# Patient Record
Sex: Female | Born: 1960
Health system: Southern US, Community
[De-identification: ages and names within clinical notes are randomized; demographics above are authoritative.]

## PROBLEM LIST (undated history)

## (undated) DIAGNOSIS — I2510 Atherosclerotic heart disease of native coronary artery without angina pectoris: Secondary | ICD-10-CM

## (undated) DIAGNOSIS — E785 Hyperlipidemia, unspecified: Secondary | ICD-10-CM

## (undated) DIAGNOSIS — G7102 Facioscapulohumeral muscular dystrophy: Secondary | ICD-10-CM

## (undated) DIAGNOSIS — I509 Heart failure, unspecified: Secondary | ICD-10-CM

## (undated) DIAGNOSIS — R9430 Abnormal result of cardiovascular function study, unspecified: Secondary | ICD-10-CM

## (undated) DIAGNOSIS — I729 Aneurysm of unspecified site: Secondary | ICD-10-CM

## (undated) DIAGNOSIS — Z95810 Presence of automatic (implantable) cardiac defibrillator: Secondary | ICD-10-CM

## (undated) DIAGNOSIS — I219 Acute myocardial infarction, unspecified: Secondary | ICD-10-CM

## (undated) DIAGNOSIS — M1990 Unspecified osteoarthritis, unspecified site: Secondary | ICD-10-CM

## (undated) DIAGNOSIS — R42 Dizziness and giddiness: Secondary | ICD-10-CM

## (undated) DIAGNOSIS — E119 Type 2 diabetes mellitus without complications: Secondary | ICD-10-CM

## (undated) DIAGNOSIS — I671 Cerebral aneurysm, nonruptured: Secondary | ICD-10-CM

## (undated) DIAGNOSIS — R51 Headache: Secondary | ICD-10-CM

## (undated) DIAGNOSIS — R002 Palpitations: Secondary | ICD-10-CM

## (undated) DIAGNOSIS — R112 Nausea with vomiting, unspecified: Secondary | ICD-10-CM

## (undated) DIAGNOSIS — I773 Arterial fibromuscular dysplasia: Secondary | ICD-10-CM

## (undated) DIAGNOSIS — Z9889 Other specified postprocedural states: Secondary | ICD-10-CM

## (undated) DIAGNOSIS — I10 Essential (primary) hypertension: Secondary | ICD-10-CM

## (undated) DIAGNOSIS — G43909 Migraine, unspecified, not intractable, without status migrainosus: Secondary | ICD-10-CM

## (undated) HISTORY — PX: CARDIAC DEFIBRILLATOR PLACEMENT: SHX171

## (undated) HISTORY — DX: Dizziness and giddiness: R42

## (undated) HISTORY — DX: Heart failure, unspecified: I50.9

## (undated) HISTORY — DX: Facioscapulohumeral muscular dystrophy: G71.02

## (undated) HISTORY — DX: Hyperlipidemia, unspecified: E78.5

## (undated) HISTORY — PX: TUBAL LIGATION: SHX77

## (undated) HISTORY — DX: Cerebral aneurysm, nonruptured: I67.1

## (undated) HISTORY — PX: ABDOMINAL HYSTERECTOMY: SUR658

## (undated) HISTORY — DX: Migraine, unspecified, not intractable, without status migrainosus: G43.909

## (undated) HISTORY — DX: Aneurysm of unspecified site: I72.9

## (undated) HISTORY — PX: PACEMAKER IMPLANT: EP1218

## (undated) HISTORY — PX: CORONARY ANGIOPLASTY WITH STENT PLACEMENT: SHX49

## (undated) HISTORY — DX: Headache: R51

## (undated) HISTORY — DX: Essential (primary) hypertension: I10

---

## 2002-04-16 ENCOUNTER — Other Ambulatory Visit: Admission: RE | Admit: 2002-04-16 | Discharge: 2002-04-16 | Payer: Self-pay | Admitting: Obstetrics and Gynecology

## 2003-05-17 ENCOUNTER — Other Ambulatory Visit: Admission: RE | Admit: 2003-05-17 | Discharge: 2003-05-17 | Payer: Self-pay | Admitting: Obstetrics and Gynecology

## 2004-06-14 ENCOUNTER — Other Ambulatory Visit: Admission: RE | Admit: 2004-06-14 | Discharge: 2004-06-14 | Payer: Self-pay | Admitting: Obstetrics and Gynecology

## 2004-08-16 ENCOUNTER — Encounter (INDEPENDENT_AMBULATORY_CARE_PROVIDER_SITE_OTHER): Payer: Self-pay | Admitting: Specialist

## 2004-08-16 ENCOUNTER — Inpatient Hospital Stay (HOSPITAL_COMMUNITY): Admission: RE | Admit: 2004-08-16 | Discharge: 2004-08-18 | Payer: Self-pay | Admitting: Obstetrics and Gynecology

## 2004-08-16 HISTORY — PX: ABDOMINAL HYSTERECTOMY: SUR658

## 2004-10-26 ENCOUNTER — Ambulatory Visit (HOSPITAL_COMMUNITY): Admission: RE | Admit: 2004-10-26 | Discharge: 2004-10-26 | Payer: Self-pay | Admitting: Urology

## 2004-10-26 ENCOUNTER — Ambulatory Visit (HOSPITAL_BASED_OUTPATIENT_CLINIC_OR_DEPARTMENT_OTHER): Admission: RE | Admit: 2004-10-26 | Discharge: 2004-10-26 | Payer: Self-pay | Admitting: Urology

## 2004-10-26 ENCOUNTER — Encounter (INDEPENDENT_AMBULATORY_CARE_PROVIDER_SITE_OTHER): Payer: Self-pay | Admitting: Specialist

## 2007-11-19 DIAGNOSIS — I639 Cerebral infarction, unspecified: Secondary | ICD-10-CM

## 2007-11-19 HISTORY — DX: Cerebral infarction, unspecified: I63.9

## 2007-11-19 HISTORY — PX: CEREBRAL ANEURYSM REPAIR: SHX164

## 2008-06-10 ENCOUNTER — Ambulatory Visit: Payer: Self-pay | Admitting: Surgery

## 2008-06-11 ENCOUNTER — Inpatient Hospital Stay (HOSPITAL_COMMUNITY): Admission: EM | Admit: 2008-06-11 | Discharge: 2008-06-13 | Payer: Self-pay | Admitting: Emergency Medicine

## 2008-06-24 ENCOUNTER — Encounter: Payer: Self-pay | Admitting: Family Medicine

## 2008-07-14 ENCOUNTER — Inpatient Hospital Stay (HOSPITAL_COMMUNITY): Admission: RE | Admit: 2008-07-14 | Discharge: 2008-07-15 | Payer: Self-pay | Admitting: Interventional Radiology

## 2008-07-14 DIAGNOSIS — I671 Cerebral aneurysm, nonruptured: Secondary | ICD-10-CM

## 2008-07-14 HISTORY — DX: Cerebral aneurysm, nonruptured: I67.1

## 2008-07-28 ENCOUNTER — Encounter: Payer: Self-pay | Admitting: Interventional Radiology

## 2008-12-09 ENCOUNTER — Encounter: Payer: Self-pay | Admitting: Interventional Radiology

## 2009-09-08 ENCOUNTER — Ambulatory Visit (HOSPITAL_COMMUNITY): Admission: RE | Admit: 2009-09-08 | Discharge: 2009-09-08 | Payer: Self-pay | Admitting: Legal Medicine

## 2009-10-26 ENCOUNTER — Ambulatory Visit (HOSPITAL_COMMUNITY): Admission: RE | Admit: 2009-10-26 | Discharge: 2009-10-27 | Payer: Self-pay | Admitting: Interventional Radiology

## 2009-11-13 ENCOUNTER — Ambulatory Visit (HOSPITAL_COMMUNITY): Admission: RE | Admit: 2009-11-13 | Discharge: 2009-11-13 | Payer: Self-pay | Admitting: Interventional Radiology

## 2010-08-15 DIAGNOSIS — I10 Essential (primary) hypertension: Secondary | ICD-10-CM | POA: Insufficient documentation

## 2010-08-15 DIAGNOSIS — I7774 Dissection of vertebral artery: Secondary | ICD-10-CM | POA: Insufficient documentation

## 2010-12-09 ENCOUNTER — Encounter: Payer: Self-pay | Admitting: Interventional Radiology

## 2011-02-18 LAB — CBC
MCHC: 35.2 g/dL (ref 30.0–36.0)
Platelets: 297 10*3/uL (ref 150–400)
WBC: 7.7 10*3/uL (ref 4.0–10.5)

## 2011-02-19 LAB — PROTIME-INR
INR: 1 (ref 0.00–1.49)
INR: 1.15 (ref 0.00–1.49)
Prothrombin Time: 13.1 seconds (ref 11.6–15.2)

## 2011-02-19 LAB — BASIC METABOLIC PANEL
BUN: 11 mg/dL (ref 6–23)
CO2: 25 mEq/L (ref 19–32)
Calcium: 10.2 mg/dL (ref 8.4–10.5)
Chloride: 109 mEq/L (ref 96–112)
Creatinine, Ser: 0.85 mg/dL (ref 0.4–1.2)
GFR calc Af Amer: >60 mL/min (ref >60)
Potassium: 3.8 mEq/L (ref 3.5–5.1)
Potassium: 4.7 mEq/L (ref 3.5–5.1)
Sodium: 138 mEq/L (ref 135–145)
Sodium: 139 mEq/L (ref 135–145)

## 2011-02-19 LAB — DIFFERENTIAL
Basophils Absolute: 0 10*3/uL (ref 0.0–0.1)
Basophils Relative: 1 % (ref 0–1)
Eosinophils Absolute: 0.1 10*3/uL (ref 0.0–0.7)
Eosinophils Absolute: 0.2 10*3/uL (ref 0.0–0.7)
Eosinophils Absolute: 0.2 10*3/uL (ref 0.0–0.7)
Eosinophils Relative: 1 % (ref 0–5)
Lymphocytes Relative: 9 % — ABNORMAL LOW (ref 12–46)
Lymphs Abs: 0.9 10*3/uL (ref 0.7–4.0)
Lymphs Abs: 1.4 10*3/uL (ref 0.7–4.0)
Monocytes Absolute: 0.4 10*3/uL (ref 0.1–1.0)
Monocytes Relative: 8 % (ref 3–12)
Monocytes Relative: 9 % (ref 3–12)
Neutro Abs: 3.4 10*3/uL (ref 1.7–7.7)
Neutrophils Relative %: 61 % (ref 43–77)
Neutrophils Relative %: 62 % (ref 43–77)
Neutrophils Relative %: 82 % — ABNORMAL HIGH (ref 43–77)

## 2011-02-19 LAB — CBC
HCT: 32.6 % — ABNORMAL LOW (ref 36.0–46.0)
Hemoglobin: 11.4 g/dL — ABNORMAL LOW (ref 12.0–15.0)
Hemoglobin: 11.9 g/dL — ABNORMAL LOW (ref 12.0–15.0)
MCHC: 34.8 g/dL (ref 30.0–36.0)
MCHC: 34.9 g/dL (ref 30.0–36.0)
MCHC: 35 g/dL (ref 30.0–36.0)
MCV: 93.6 fL (ref 78.0–100.0)
MCV: 94.4 fL (ref 78.0–100.0)
MCV: 94.6 fL (ref 78.0–100.0)
Platelets: 254 10*3/uL (ref 150–400)
Platelets: 262 10*3/uL (ref 150–400)
RBC: 3.45 MIL/uL — ABNORMAL LOW (ref 3.87–5.11)
RBC: 3.68 MIL/uL — ABNORMAL LOW (ref 3.87–5.11)
RBC: 4.31 MIL/uL (ref 3.87–5.11)
WBC: 5.4 10*3/uL (ref 4.0–10.5)
WBC: 5.4 10*3/uL (ref 4.0–10.5)
WBC: 7.8 10*3/uL (ref 4.0–10.5)

## 2011-02-19 LAB — HEPARIN LEVEL (UNFRACTIONATED): Heparin Unfractionated: 0.2 IU/mL — ABNORMAL LOW (ref 0.30–0.70)

## 2011-02-21 LAB — CBC
HCT: 43.9 % (ref 36.0–46.0)
Hemoglobin: 15.4 g/dL — ABNORMAL HIGH (ref 12.0–15.0)
MCV: 94.1 fL (ref 78.0–100.0)
RDW: 12.8 % (ref 11.5–15.5)
WBC: 7.4 10*3/uL (ref 4.0–10.5)

## 2011-02-21 LAB — APTT: aPTT: 25 seconds (ref 24–37)

## 2011-02-21 LAB — BASIC METABOLIC PANEL
Chloride: 107 mEq/L (ref 96–112)
GFR calc non Af Amer: >60 mL/min (ref >60)
Glucose, Bld: 102 mg/dL — ABNORMAL HIGH (ref 70–99)
Potassium: 5 mEq/L (ref 3.5–5.1)
Sodium: 139 mEq/L (ref 135–145)

## 2011-04-02 NOTE — Consult Note (Signed)
NAME:  Allison White, TAKETA NO.:  [id number]   MEDICAL RECORD NO.:  [id number]          PATIENT TYPE:  OUT   LOCATION:  XRAY                         FACILITY:  [facility]   PHYSICIAN:  Sanjeev K. Deveshwar, M.D.DATE OF BIRTH:  [birth year]   DATE OF CONSULTATION:  12/09/2008  DATE OF DISCHARGE:                                 CONSULTATION   CHIEF COMPLAINT:  Status post left internal carotid artery  pseudoaneurysm stent performed on July 14, 2008.   BRIEF HISTORY:  This is a very pleasant 50 year old female who was  admitted to [facility] on July 2009 by [facility] for  evaluation of a headache and dysarthria.  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  A cerebral angiogram was  performed by Dr. Corliss Skains on June 11, 2008.  This showed a large left  internal carotid artery pseudoaneurysm.  There was a question of an old  healed right internal carotid artery dissection as well.  There were  changes consistent with fibromuscular dysplasia.  The patient was  treated with aspirin and Plavix.  Treatment options were later discussed  along with the risks and benefits.  The patient eventually underwent  stent placement for the left internal carotid artery pseudoaneurysm  performed on July 14, 2008, by Dr. Corliss Skains without known or  immediate complications.   Over the past several weeks, the patient has developed some mild to  moderate headaches.  She was seen at her primary care office yesterday  for further evaluation.  She was seen by Gerre Pebbles, a physician  assistant, who contacted our office for further information.  The  patient apparently had no neurologic deficits and her vital signs were  stable other than a mildly high blood pressure and the above-noted  headache.  We recommended symptomatic treatment with a followup visit  with Dr. Corliss Skains today.   PAST MEDICAL HISTORY:  Significant for hypertension, hyperlipidemia,  interstitial cystitis, history of meningitis 4-5 years ago, and history  of a hole in my heart as a child.   PAST SURGICAL HISTORY:  The patient has had a C-section and  hysterectomy.  She has had bladder surgery.  She has had nausea and  vomiting associated with anesthesia.  This has been relieved in the past  with a scopolamine patch.   ALLERGIES:  No known drug allergies.  The patient had been placed on  Plavix after the stent was placed; however, this caused joint pain and  she was switched to Ticlid.  The Ticlid was eventually discontinued.   CURRENT MEDICATIONS:  Aspirin 325 mg daily and lisinopril.  It is  unclear whether she still takes Zocor or Mavik.  She does take  hydrocodone 1/2 tablet for headache pain.   SOCIAL HISTORY:  The patient is married.  She and her husband have four  children.  [place]  She has a minimal  history of tobacco use.  She drinks alcohol rarely.  She works as an  Airline pilot for [employer].   FAMILY  HISTORY:  Her mother is alive at age 65.  She has hypertension  and hypothyroidism.  Her father is alive at age 61.  He has a history of  prostate cancer and hypertension.  She has a grandmother who had a CVA  and a possible aneurysm in her neck.   IMPRESSION AND PLAN:  As noted, the patient returns today for further  evaluation having undergone stent placement for a left internal carotid  artery pseudoaneurysm on July 14, 2008.  There was also a question of  possible old right internal carotid artery dissection.  The patient had  experienced difficulty with tongue movements as well as some changes in  her peripheral vision.  On initial presentation, there was some concern  that she may have had a stroke.  Dr. Corliss Skains feels that the symptoms  were related to the pseudoaneurysm; for the most part these have now  improved and resolved.   Dr. Corliss Skains  did recommend a followup cerebral angiogram 3 months after  the placement of the stent per his protocol.  The patient declined due  to the cost of the procedure and the fact that she was asymptomatic.   As noted, the patient recently developed some headaches.  She describes  this is being in the left parietal area.  At times, the pain is an 8 on  a 1 to 10 scale.  She has also been feeling dizzy.  She reports that her  head throbs and she has photophobia associated with these headaches.  They are usually relieved with rest and a half of a Vicodin.  She has  been having several of these headaches per week.   Dr. Corliss Skains did not feel that the headaches were related to the  previous stent placement or the pseudoaneurysm.  He did review the  images from the previous placement of the stent with the patient and her  husband.  He pointed out the area.  As noted, the patient has declined  having further angiogram as had been recommended.  Since the patient is  asymptomatic from a neurologic standpoint, Dr. Corliss Skains did not feel  that it is imperative to have further studies at this time.   Yesterday, her blood pressure apparently was mildly high.  We had it  checked by our nurse today.  Her blood pressure in the right arm was  122/92, the left arm was 129/78.  I believe she is on Lotensin 1/2  tablet per day for her blood pressure.  We will defer further decisions  regarding blood pressure to her primary care physician.   Dr. Corliss Skains did recommend a followup visit with him in approximately 4  months.  She is to contact us if she develops any new onset of  neurologic symptoms, otherwise the headaches should be treated  symptomatically with rest and hydrocodone.  Greater than 40 minutes was  spent on this visit.      Delton See, P.A.    ______________________________  Grandville Silos. Corliss Skains, M.D.    DR/MEDQ  D:  12/09/2008  T:  12/10/2008  Job:  [id number]   cc:   Revonda Standard

## 2011-04-02 NOTE — H&P (Signed)
NAME:  Allison White, Allison White NO.:  [id number]   MEDICAL RECORD NO.:  [id number]          PATIENT TYPE:  INP   LOCATION:  1844                         FACILITY:  [facility]   PHYSICIAN:  Gustavus Messing. Orlin Hilding, M.D.DATE OF BIRTH:  January 12, 1961   DATE OF ADMISSION:  06/10/2008  DATE OF DISCHARGE:                              HISTORY & PHYSICAL   CHIEF COMPLAINT:  Severe left-sided headache and tongue weakness.   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  She noted that she had a  pretty significant headache on the left side of her head, left forehead,  and posterior for about the last week when she woke up on Thursday  morning with part of her tongue not working right, and her speech  slurred and voice hoarse.  She thought perhaps that she was getting  strep throat as her son who lives with her also had that; however, she  felt that it perhaps is more than that.  By Friday morning, she went to  see her primary physician who ordered an MRI scan which was done at  [facility].  MRI was negative for any acute stroke, but MRA  suggested that she had a pseudoaneurysm in the left ICA and possibly  dissection.  Primary physician spoke with neurosurgeon, Dr. Yetta Barre, at  [facility] and the patient was to follow up and be seen by Dr. Yetta Barre,  but Dr. Yetta Barre determined that the problem was not neurosurgical in  nature.  She was then seen by the Vascular Surgery Service who  determined that it was not a surgical problem and Neurology was  eventually consulted.  The patient denies any weakness, numbness, vision  problems, and I think she has difficulty with speech because of her  tongue weakness and some hoarseness and choked a little bit on food and  beverages.   REVIEW OF SYSTEMS:  Positive for the headache and otherwise as outlined  in the history of present illness.  She has some bladder irritation from  time  to time and takes Pyridium for that.   PAST MEDICAL HISTORY:  Significant for a recent question of elevated  blood pressure just yesterday, had a remote C-section, remote  hysterectomy, and some kind of bladder stretching procedure.   MEDICATIONS:  She was only just put on lisinopril yesterday at 10 mg and  she takes p.r.n. Pyridium.   ALLERGIES:  No known drug allergies.   SOCIAL HISTORY:  She is an Airline pilot for [employer].  She is married with  four children.  She never used tobacco, alcohol, or illicit drugs,  specifically has never used cocaine.   OBJECTIVE:  VITAL SIGNS:  Blood pressure is 109/69, heart rate 87,  respirations 23, and 97% sat.  HEENT:  Head is normocephalic, atraumatic.  NECK:  Supple without bruits.  HEART:  Regular rate and rhythm.  LUNGS:  Clear to auscultation.  ABDOMEN:  Nontender.  SKIN:  Dry.  Mucosa moist.  NEUROLOGIC:  She is alert.  She answers 2/2 questions correctly, follows  2/2  commands correctly, has a normal gaze.  No visual deficit.  No  facial weakness, although there was some mild asymmetry from time to  time, a little bit hard to tell actually looking at her right facial  weakness, but it was not reproducible.  There is no drift in either  upper or lower extremities and no ataxia.  No sensory loss.  No language  deficit.  She did have a mild dysarthria.  There is no extinction.   Scans were done at an outside hospital that by report showed a negative  MRI, but showed the questionable aneurysm.  She did have a CT angiogram  done to try to get a better view of the vessels.  It is still  questionable that she apparently has a pseudoaneurysm at the genu of the  petrous portion of the left ICA.  The interpretation by Dr. Constance Goltz  indicated abnormal appearance of the cervical aspect of both ICA  especially on the left possibly related to vasculitis such as  fibromuscular dysplasia, cannot rule out dissection, especially on the  left where there  is fusiform aneurysmal dilatation at petrous genu,  question pseudoaneurysm.  It is recommended that a conventional catheter  angiogram be performed.  Labs were unremarkable.  Sed rate was ordered  and is normal at 4.  Score of only 1 on the stroke scale was noted.   ASSESSMENT:  Left tongue weakness and dysarthria associated with a left  internal carotid artery dissection and/or pseudoaneurysm with  questionable underlying connective tissue disorder.   PLAN:  Admit to stroke service.  We will obtain a cerebral angiogram in  the morning.  She needs to be n.p.o. for now and needs a speech  evaluation after the angiogram.  We will probably treat her with  aspirin.      Catherine A. Orlin Hilding, M.D.  Electronically Signed     CAW/MEDQ  D:  06/11/2008  T:  06/11/2008  Job:  [id number]

## 2011-04-02 NOTE — Discharge Summary (Signed)
NAME:  Allison White, Allison White              ACCOUNT NO.:  [id number]   MEDICAL RECORD NO.:  [id number]          PATIENT TYPE:  INP   LOCATION:  3005                         FACILITY:  [facility]   PHYSICIAN:  Pramod P. Pearlean Brownie, MD    DATE OF BIRTH:  1961/09/12   DATE OF ADMISSION:  06/11/2008  DATE OF DISCHARGE:  06/13/2008                               DISCHARGE SUMMARY   DIAGNOSES AT TIME OF DISCHARGE:  1. Pseudoaneurysm left internal carotid artery of the petrous segment      question etiology, possibly fibromuscular dysplasia.  2. Dyslipidemia.  3. Hysterectomy.  4. Cesarean section.  5. Enlarged bladder.  6. Question diagnosis of hypertension day prior to admission with      normal blood pressures in the hospital.   MEDICINES AT TIME OF DISCHARGE:  1. Aspirin 81 mg a day.  2. Zocor 20 mg a day.  3. Plavix 75 mg a day.  4. Consider lisinopril 10 mg a day should blood pressure remain      elevated.   STUDIES PERFORMED:  1. MRI of the brain shows negative stroke abnormality in the left ICA      petrous area.  CT angio of the head and neck done in hospital shows      no intracranial aneurysm identified, major intracranial vascular      structures patent.  CT angio of the neck shows possibility of      fibromuscular dysplasia.  Other arteriopathy possibilities with ICA      dissection contributing to fusiform aneurysmal dilatation of the      distal left internal carotid artery at the skull base.  Fullness of      the left tongue base with __________ the vallecula.  Chest x-ray      shows no acute abnormalities.  EKG shows normal sinus rhythm.   LABORATORY STUDIES:  Homocystine 5.1, sed rate 6, CBC with hemoglobin  15.1, otherwise normal.  Cholesterol 227, triglycerides 113, HDL 56, LDL  148.  Coags on admission were normal.  Chemistry normal.  Blood type B+  with no antibodies.  Urinalysis was 7-10 red blood cells, 0-2 white  blood cells, many bacteria.   HISTORY OF PRESENT ILLNESS:  Ms.  Allison White is a 50 year old female  who complains of significant headache on the left side of her head, left  forehead, and posterior for about the last week.  When she woke up on  Thursday morning with part of her tongue not working right and her  speech slurred and voice hoarse.  She thought perhaps she was getting a  strep throat as her son who lives with her also had that.  By Friday  morning, she went to see her primary physician who ordered an MRI scan,  which was done at [facility].  MRI was negative for acute stroke.  The MRI suggested a pseudoaneurysm in the left ICA with possible  dissection.  Her primary physician spoke with Dr. Yetta Barre, neurosurgeon,  and the patient was asked to follow up with him.  Dr. Yetta Barre then  determined that problem was not  neurosurgical in nature and was seen by  our Vascular Surgery Service who determined that it was not a surgical  problem and Neurology was eventually consulted.  The patient denied any  weakness, numbness, vision problems.  I think she had difficulty with  speech and also tongue weakness and some hoarseness and choked a bit on  food and beverages.  She was admitted to the hospital for further stroke  evaluation.  She was not a TPA candidate secondary to onset of symptoms  and no acute stroke.   HOSPITAL COURSE:  CT angio of the neck did confirm pseudoaneurysm and  left ICA petrous segment at the base of the skull.  Possibly, this is  related to fibromuscular dysplasia based on the CT angio.  She was  placed on aspirin and Plavix for treatment for the next 3 months with  plans to go to aspirin only after 3 months.  Options for PTCA and stent  were discussed with the patient as were possible referral to [facility].  The  patient refused referral at this time.  We will consider treatment  options and possibly stent in the future.  She is going to follow up  with Dr. Pearlean Brownie in 2-3 months.  She was strongly advised to call should  she  have any other teeth, tongue weakness or any other neuro symptoms or  changes.  Of note, the patient was also diagnosed with dyslipidemia in  the hospital and started on Zocor.  She apparently had also been started  on lisinopril the day prior to admission though her blood pressures in  the hospital have been within normal range with the highest being  130/91.  Blood pressure on day of discharge was 111/80.  We will not  continue lisinopril at this time, but may need to consider based on home  blood pressures.  She has been asked to follow up with her primary care  physician within 1-2 weeks.   CONDITION ON DISCHARGE:  The patient alert and oriented x3.  No aphasia,  no dysarthria.  Eye movements are full.  Her face is symmetric.  Her  left tongue is wasting and her tongue deviates to the left on  protrusion, but moves bilaterally.  Otherwise, she has no focal limb  weakness, no sensory loss.   DISCHARGE PLAN:  1. Discharge home with family.  2. Aspirin and Plavix x3 months, then aspirin alone.  3. Follow up with Dr. Delia Heady in 2-3 months.  Consider angiogram      at that time and possible stent treatment if needed.  4. The patient has been advised to follow her primary care physician      within 1 month.  5. The patient has also been advised to call with any new neuro      complaints.      Annie Main, N.P.    ______________________________  Sunny Schlein. Pearlean Brownie, MD    SB/MEDQ  D:  06/13/2008  T:  06/14/2008  Job:  [id number]   cc:   Sharyne Peach

## 2011-04-02 NOTE — Consult Note (Signed)
NAME:  Allison White, Allison White NO.:  [id number]   MEDICAL RECORD NO.:  [id number]          PATIENT TYPE:  OUT   LOCATION:  XRAY                         FACILITY:  [facility]   PHYSICIAN:  Sanjeev K. Deveshwar, M.D.DATE OF BIRTH:  1961-03-26   DATE OF CONSULTATION:  06/24/2008  DATE OF DISCHARGE:                                 CONSULTATION   CHIEF COMPLAINT:  Cerebrovascular disease.   HISTORY OF PRESENT ILLNESS:  This is a 50 year old female who was  admitted to [facility] on June 10, 2008, by Dr. Clint Bolder  with a headache and dysarthria.  She had had an MRI, MRA previously  performed at [facility] that was consistent with a left internal  carotid artery pseudoaneurysm with possible dissection.  A cerebral  angiogram was performed by Dr. Corliss Skains on June 11, 2008, that showed a  large left internal carotid artery pseudoaneurysm.  There was a question  of an old healed right internal carotid dissection.  There were changes  consistent with fibromuscular dysplasia.  The patient was discharged  from the hospital on aspirin and Plavix.  She now presents accompanied  by her daughter and her mother to be seen in followup by Dr. Corliss Skains.   PAST MEDICAL HISTORY:  Significant for a questionable history of  hypertension.  The patient had been started on lisinopril recently.  Apparently, she had normal blood pressures while in the hospital and was  taken off the lisinopril.  She was found to have hyperlipidemia and she  is currently being treated with Zocor.  She has a history of a hole in  her heart as a child.  We do not have any details, apparently this  resolved on its own.  She has interstitial cystitis.  She had a  questionable case of meningitis 4-5 years ago.   SURGICAL HISTORY:  Significant for a C-section, a hysterectomy, and some  type of bladder procedure.  She reports nausea and vomiting associated  with anesthesia.  She reports that she had good  relief with a  scopolamine patch.   ALLERGIES:  No known drug allergies.   CURRENT MEDICATIONS:  1. Lisinopril, dose is not available.  2. Aspirin 81 mg daily.  3. Zocor 20 mg daily.  4. Plavix 75 mg daily.  5. Pyridium p.r.n.   SOCIAL HISTORY:  The patient is married.  She has 4 children.  She lives  in [place] with her husband.  She has a remote minimal history of  tobacco use.  She drinks alcohol rarely.  She works as an Airline pilot for  [employer].   FAMILY HISTORY:  Her mother is alive at age 57.  She has hypertension  and hypothyroidism.  Her father is alive at age 9.  He has a history of  prostate cancer and hypertension.  She had a grandmother who had a CVA  and possible aneurysm in her neck.   IMPRESSION AND PLAN:  As noted, the patient presents today accompanied  by her mother and daughter to be seen in consultation by Dr. Corliss Skains  to discuss results of her recent cerebral  angiogram performed on June 11, 2008, which showed a large left internal carotid artery  pseudoaneurysm.  She had had stroke-like symptoms at the time of  admission to the hospital including a headache, numbness of her tongue,  and dysarthria.  She has had at least 2 headaches since discharge from  the hospital, but neither of these have been severe.  She has occasional  difficulties with her speech when tired.  She states that her tongue  still feels numb in areas and she does have difficulty eating and  swallowing.  She has since returned to work.   Dr. Corliss Skains reviewed the results of the cerebral angiogram with the  patient and her family.  He felt that she would need a stent or possibly  2 stents for repair of the pseudoaneurysm.  He felt that she would be at  possible risk for a stroke if the pseudoaneurysm was not repaired.  Fibromuscular dysplasia was also discussed with the patient.  She  questioned whether she needed further workup for this problem.  Dr.  Corliss Skains felt that the main  concern at this time was to treat the  pseudoaneurysm.  A further workup could be addressed at a later date by  her primary care physician, Dr. Steva Ready.   The stenting procedure was described.  Dr. Corliss Skains felt that he might  be able to do this under monitored anesthesia care as opposed to general  anesthesia.  He would leave it up to the patient and her family to  decide whether or not she wanted to proceed with the intervention.  If  she did not have the area stented, he felt that she should least have a  repeat MRI in 6-8 weeks to further evaluate this area.  He did not feel  that this would heal on its own.   The patient was told to contact us when she had made a decision whether  or not to proceed with stenting.  She is currently on aspirin and  Plavix.  Dr. Corliss Skains recommends that she continue this long-term.  She  is to call us if she develops any new neurologic symptoms.  Greater than  40 minutes was spent on this consult.  All of their questions were  answered.  They were also given some written material to review.  The  patient will contact us next week with her decision.      Delton See, P.A.       ______________________________  Grandville Silos. Corliss Skains, M.D.    DR/MEDQ  D:  06/24/2008  T:  06/25/2008  Job:  [id number]   cc:   Sharyne Peach

## 2011-04-02 NOTE — Consult Note (Signed)
NAME:  Allison White, Allison White NO.:  [id number]   MEDICAL RECORD NO.:  [id number]          PATIENT TYPE:  OUT   LOCATION:  XRAY                         FACILITY:  [facility]   PHYSICIAN:  Sanjeev K. Deveshwar, M.D.DATE OF BIRTH:  10/20/1961   DATE OF CONSULTATION:  07/28/2008  DATE OF DISCHARGE:                                 CONSULTATION   DATE OF CONSULT:  July 28, 2008.   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   BRIEF HISTORY:  This is a pleasant 50 year old female who was admitted  to [facility] on June 10, 2008, by Dr. Marcelino Freestone for  evaluation of a headache and dysarthria.  The patient had an MRI and MRA  previously performed at [facility] that was consistent with a  left internal carotid artery pseudoaneurysm with possible dissection.  A  cerebral angiogram was performed by Dr. Corliss Skains on June 11, 2008.  This showed a large left internal carotid artery pseudoaneurysm.  There  was a question of an old healed right internal carotid artery dissection  as well.  There were changes noted to be consistent with fibromuscular  dysplasia.  The patient was treated with aspirin and Plavix.   The patient returned to see Dr. Corliss Skains in consultation on June 24, 2008.  Treatment options were discussed at that time.  The patient made  a decision to proceed with stenting of the pseudoaneurysm.  She was  admitted to [facility] on July 14, 2008, for the procedure.  There were no known or immediate complications.  The patient was  discharged the following day on July 15, 2008.  She returns today  accompanied by her husband to be seen in followup.   PAST MEDICAL HISTORY:  Significant for hypertension, hyperlipidemia,  interstitial cystitis, a history of meningitis 4-5 years ago, a history  of a hole in my heart as a child.  We do not have details regarding  this  issue.   PAST SURGICAL HISTORY:  The patient is status post C-section and status  post hysterectomy.  She has a history of bladder surgery.  She reports  nausea and vomiting associated with anesthesia.  She has had good relief  in the past with a scopolamine patch.   ALLERGIES:  No known drug allergies, although she was recently started  on Plavix, which she felt caused severe joint pain.  The Plavix was  changed to Ticlid during her most recent hospital stay.   CURRENT MEDICATIONS:  Aspirin 325 mg daily, lisinopril, Ticlid 250 mg  b.i.d., Zocor, Mavik, and Tylenol p.r.n.   SOCIAL HISTORY:  The patient is married.  She and her husband have 4  children.  [place].  She has a minimal  history of tobacco use.  She drinks alcohol rarely.  She works as an  Airline pilot for [employer].   FAMILY HISTORY:  Her mother is alive at age 95.  She has hypertension  and hypothyroidism.  Her father is alive at age 15, he has a  history of  prostate cancer and hypertension.  She had a grandmother who had a CVA  and a possible aneurysm in her neck.   IMPRESSION AND PLAN:  As noted, the patient returns today to be seen in  followup after undergoing stenting of a left internal carotid artery  pseudoaneurysm.  She is accompanied by her husband today for this visit.  Unfortunately, Dr. Corliss Skains was detained in a procedure.  The patient  did not wish to wait, so she requested to be seen by the physician  assistant in lieu of Dr. Corliss Skains.   The patient reports that she has been doing well.  She has had some mild  headaches, but this is not unusual for her.  Her cystitis did flare-up  after discharge from the hospital.  She feels this may in part be  secondary to the aspirin that she is currently taking.  As noted, we did  have the patient on Plavix, although this caused her to have joint pain.  She was switched to Ticlid 250 mg b.i.d. during her most recent hospital  stay.   It has been recommended that she have a CBC blood test every 2  weeks while on the Ticlid.  She plans to see Dr. Brent Bulla tomorrow  for a CBC.  Her joint pain has resolved off the Plavix.   Overall, the patient is doing well at this time.  We did give her  permission to resume driving and to resume her normal activities.  She  was told to avoid anything extremely strenuous.  She was told that she  get return to work on Monday, August 01, 2008, without restrictions.   The plan at this time will be to continue the Ticlid for at least 4  weeks.  She is also on aspirin.  After the Ticlid is stopped, she will  continue on the aspirin along with her other medications.  As noted, she  has to have a CBC blood test every 2 weeks while on the Ticlid to  monitor her blood counts.  Dr. Corliss Skains will perform a repeat cerebral  angiogram in approximately 3 months to further monitor the status of her  pseudoaneurysm following stent placement.   Greater than 15 minutes was spent on this followup visit.      Delton See, P.A.    ______________________________  Grandville Silos. Corliss Skains, M.D.    DR/MEDQ  D:  07/28/2008  T:  07/29/2008  Job:  [id number]   cc:   Santina Evans A. Orlin Hilding, M.D.  Jagjit Adline Peals, MD

## 2011-04-02 NOTE — H&P (Signed)
NAME:  Allison White, Allison White NO.:  [id number]   MEDICAL RECORD NO.:  [id number]          PATIENT TYPE:  OBV   LOCATION:  3172                         FACILITY:  [facility]   PHYSICIAN:  Sanjeev K. Deveshwar, M.D.DATE OF BIRTH:  1961/05/03   DATE OF ADMISSION:  07/14/2008  DATE OF DISCHARGE:                              HISTORY & PHYSICAL   CHIEF COMPLAINT:  Left internal carotid artery pseudoaneurysm.   HISTORY OF PRESENT ILLNESS:  This is a pleasant 50 year old female who  was admitted to [facility] on June 10, 2008, by Dr. Marcelino Freestone for evaluation of a headache and dysarthria.  The patient had an  MRI and MRA previously performed at [facility] that was  consistent with a left internal carotid artery pseudoaneurysm with  possible dissection.  A cerebral angiogram was performed by Dr.  Corliss Skains on June 11, 2008, that showed a large left internal carotid  artery pseudoaneurysm.  There was a question of an old healed right  internal carotid dissection as well.  There were also changes consistent  with fibromuscular dysplasia.  The patient was treated with aspirin and  Plavix.  She returned to see Dr. Corliss Skains in consultation on June 24, 2008.  Treatment options were discussed and a decision was made to admit  the patient to [facility] on July 14, 2008, to undergo a  repeat angiogram with possible stenting of the left internal carotid  artery pseudoaneurysm.  The risks and benefits were discussed at the  initial consult as well as on the day of admission.   PAST MEDICAL HISTORY:  Significant for  1. Hypertension.  2. Hyperlipidemia.  3. Interstitial cystitis.  4. The patient reports that she had meningitis 4-5 years ago.  5. She also reports that she had a hole in her heart as a child.  We      do not have details regarding this problem.   SURGICAL HISTORY:  The patient is  1. Status post C-section.  2. Hysterectomy.  3. A bladder  procedure.  4. She reports nausea and vomiting associated with anesthesia.  She      had good relief with a scopolamine patch in the past.   ALLERGIES:  No known drug allergies, although she reports that she had  joint pain associated with taking PLAVIX.   MEDICATIONS ON ADMISSION:  Lisinopril, aspirin, Plavix, Zocor, Mavik and  Tylenol p.r.n.   SOCIAL HISTORY:  The patient is married.  She has 4 children.  She lives  in [place], [place], with her husband.  She has a minimal  history of tobacco use.  She drinks alcohol rarely.  She works as an  Airline pilot for [employer].   FAMILY HISTORY:  Her mother is alive at age 38.  She has hypertension  and hypothyroidism.  Her father is alive at age 84.  He has history of  prostate cancer and hypertension.  She had a grandmother who had a CVA  and a possible aneurysm of her neck.   REVIEW OF SYSTEMS:  Completely negative except for the following.  The  patient has continued to have headaches and occasional episodes of  dizziness especially when she stands up fast.  She has had joint pain  associated with the Plavix.  She stopped the Plavix for several days and  the joint pain resolved, although she was instructed to resume the  Plavix if tolerated.  She has continued on her Plavix.   LABORATORY DATA:  An INR was 1, PTT was 29.  BUN was 9, creatinine 0.92,  potassium 4.8.  Hemoglobin 14.4, hematocrit 93.8, WBC 4.9 thousand,  platelets were 215,000.  Her glucose was 108.  Her GFR was greater than  60.   PHYSICAL EXAMINATION:  GENERAL APPEARANCE:  A pleasant 50 year old white  female in no acute distress.  VITAL SIGNS:  Blood pressure 122/80, pulse 83, respirations 18,  temperature 98.1, oxygen saturation 97%.  HEENT:  Unremarkable.  NECK:  No bruits.  HEART:  Regular rate and rhythm without murmur.  LUNGS:  Clear.  ABDOMEN:  Soft, nontender.  EXTREMITIES:  Exam revealed pulses to be weak but intact.  No  significant edema.  Her  airway was rated at a 1.  Her ASA scale was a 4.  NEUROLOGIC:  Mental status:  The patient is alert and oriented, follows  commands.  Cranial nerves II-XII grossly intact.  Sensation intact to  light touch.  Motor strength approximately 4-5/5.  Cerebellar testing  was intact.   IMPRESSION:  1. Left internal carotid artery pseudoaneurysm by cerebral angiogram      performed June 11, 2008.  2. Question of an old healed right internal carotid dissection.  3. Changes consistent with fibromuscular dysplasia.  4. Joint pain associated with Plavix.  5. Hypertension.  6. Hyperlipidemia.  7. History of interstitial cystitis.  8. Remote history of meningitis.  9. Status post multiple surgeries.  Please see above.   PLAN:  As noted, the patient returns today for repeat cerebral angiogram  and possible stent placement for her left internal carotid artery  pseudoaneurysm if felt to be safe and indicated.  This will be performed  under general anesthesia by Dr. Corliss Skains.      Delton See, P.A.    ______________________________  Grandville Silos. Corliss Skains, M.D.    DR/MEDQ  D:  07/14/2008  T:  07/14/2008  Job:  [id number]   cc:   Santina Evans A. Orlin Hilding, M.D.  [employer]

## 2011-04-02 NOTE — Discharge Summary (Signed)
NAME:  Allison White, Allison White NO.:  [id number]   MEDICAL RECORD NO.:  [id number]          PATIENT TYPE:  INP   LOCATION:  3108                         FACILITY:  [facility]   PHYSICIAN:  Sanjeev K. Deveshwar, M.D.DATE OF BIRTH:  January 26, 1961   DATE OF ADMISSION:  07/14/2008  DATE OF DISCHARGE:  07/15/2008                               DISCHARGE SUMMARY   CHIEF COMPLAINT:  Left internal carotid artery pseudoaneurysm, status  post stent placement on July 14, 2008.   HISTORY OF PRESENT ILLNESS:  This is a pleasant 50 year old female who  was admitted to [facility] on June 10, 2008, by Dr. Marcelino Freestone for evaluation of a headache and dysarthria.  The patient had an  MRI and MRA previously performed at [facility] that was  consistent with a left internal carotid artery pseudoaneurysm with  possible dissection. A cerebral angiogram was performed by Dr. Corliss Skains  on June 11, 2008, that showed a large left internal carotid artery  pseudoaneurysm.  There was a question of an old healed right internal  carotid artery dissection as well.  There were also changes consistent  with fibromuscular dysplasia.  The patient was treated with aspirin and  Plavix.   The patient returned to see Dr. Corliss Skains in consultation on June 24, 2008.  Treatment options were discussed at that time and the patient  made a decision to proceed with intervention.  She was admitted to [facility] on July 16, 2008, for a repeat angiogram and possible  stenting of the left internal carotid artery pseudoaneurysm.  The risks  and the benefits of the procedure were discussed at the time of the  initial consult as well as on the day of admission.   PAST MEDICAL HISTORY:  1. Hypertension.  2. Hyperlipidemia.  3. Interstitial cystitis.  4. History of meningitis 4-5 years ago.  5. History of a hole in her heart as a child.  We do not have the      details regarding this  issue.   SURGICAL HISTORY:  The patient is status post C-section and status post  hysterectomy.  She has a history of bladder surgery.  She reports nausea  and vomiting associated with anesthesia.  She has had good relief in the  past with a scopolamine patch.   ALLERGIES:  No known drug allergies, although she had recently been  placed on Plavix, which she did not tolerate secondary to joint pain.   MEDICATIONS:  At the time of admission included lisinopril, aspirin,  Plavix, Zocor, Mavik, and Tylenol p.r.n.   SOCIAL HISTORY:  The patient is married.  She has four children.  She  lives in [place], [place] her [place].  She has a minimal  history of remote tobacco use.  She drinks alcohol rarely.  She works as  an Airline pilot for [employer].   FAMILY HISTORY:  Mother is alive at age 44.  She has hypertension and  hypothyroidism.  Her father is alive at age 45.  He has a history of  prostate cancer and hypertension.  She  had a grandmother who had a CVA  and a possible aneurysm of her neck.   HOSPITAL COURSE:  As noted, this patient was admitted to [facility] on July 14, 2008, for further evaluation and possible  treatment of a left internal carotid artery pseudoaneurysm, found after  she presented with a headache and dysarthria on June 10, 2008.  The  patient had a cerebral angiogram repeated on the day of admission  followed by stenting of the pseudoaneurysm performed by Dr. Corliss Skains  under general anesthesia.  Please see his dictated report for full  details.  The patient tolerated the procedure well.  There were no  obvious or immediate complications.   The patient was admitted to the neuro intensive care unit where she was  placed on intravenous heparin overnight.  The following day, she did  report that she had some visual problems in her left eye.  There was a  very tiny blind spot in that eye; however, this did resolve by the  morning of July 15, 2008.  She reported no further episodes prior to  discharge.   The patient also had a low-grade headache.  She had been experiencing  headaches off and on for sometime.  The headache was centered over her  left eye and radiated around to the occipital area.  This was treated  with ibuprofen and Vicodin on the day of discharge.   The right femoral groin sheath was removed on the morning of discharge.  She was placed on bedrest for 6 hours.  At the end of bedrest, she is to  be ambulated.  If she remains stable, she will be discharged home.   As noted, the patient did not tolerate Plavix.  She requested another  antiplatelet agent.  She was started on Ticlid 250 mg b.i.d. on July 15, 2008.   LABORATORY DATA:  Basic metabolic panel on July 15, 2008, revealed BUN  of 12, creatinine 0.85, GFR was greater than 60, potassium was 3.6,  sodium was 140, and glucose was 112.  A CBC on the day of discharge  revealed hemoglobin 13.3, hematocrit 38.5, WBCs 8900, and platelets  219,000.   DISCHARGE MEDICATIONS:  The patient was told to continue the same  medication.  She was on prior to admission with the exception of the  Plavix, which was discontinued.  She was started on Ticlid 250 mg b.i.d.  on the day of discharge.  She was also given a prescription for Vicodin  5/325 one q.6 h. p.r.n. headache pain #30, no refills.   The patient was given instructions regarding wound care.  She was told  not to do anything strenuous and not to return to work for at least 2  weeks.   The patient will have a followup appointment with Dr. Corliss Skains in  approximately 2 weeks.  She was told to see Dr. Steva Ready in 2 weeks to  have a CBC blood test.  She will need to remain on Ticlid for at least 4  weeks following the placement of the stent.  She will need a CBC blood  test every 2 weeks while on this medication.  We have requested that  this to be performed to Dr. Richardean Sale office.  Dr. Corliss Skains plans to   perform a repeat angiogram in approximately 3 months.   DISCHARGE DIAGNOSES:  1. Status post left internal carotid artery stent for asymptomatic      pseudoaneurysm performed on July 14, 2008,  under general      anesthesia.  2. Question of an old healed right internal carotid artery dissection.  3. Changes consistent with fibromuscular dysplasia.  4. Intolerance to Plavix secondary to joint pain.  5. Hypertension.  6. Hyperlipidemia.  7. History of interstitial cystitis.  8. Remote history of meningitis.  9. Status post multiple surgeries as noted above.  10.Visual disturbance during her stay, which resolved prior to      discharge.  11.Ongoing headaches.  12.Residual weakness of her tongue associated with some difficulty      swallowing first noted in July 2009.      Delton See, P.A.    ______________________________  Grandville Silos. Corliss Skains, M.D.    DR/MEDQ  D:  07/15/2008  T:  07/16/2008  Job:  [id number]   cc:   Santina Evans A. Orlin Hilding, M.D.  [employer]

## 2011-04-05 NOTE — H&P (Signed)
NAME:  Allison White, Allison White NO.:  [id number]   MEDICAL RECORD NO.:  [id number]          PATIENT TYPE:  INP   LOCATION:  9399                          FACILITY:  [facility]   PHYSICIAN:  Daniel L. Gottsegen, M.D.DATE OF BIRTH:  1961-07-23   DATE OF ADMISSION:  08/16/2004  DATE OF DISCHARGE:                                HISTORY & PHYSICAL   CHIEF COMPLAINT:  Dysmenorrhea, pelvic pain, menorrhagia.   HISTORY OF PRESENT ILLNESS:  The patient is a 50 year old, gravida 4, para  4, AB 0 with a history of progressively increasing dysmenorrhea, pelvic pain  and menorrhagia. Her symptoms are almost all related. She has been tried on  oral contraceptives for relief of these symptoms even though she is status  post tubal ligation and that has significantly helped but in spite of  switching pills she continues to have pill side effects including headache  and other oral contraceptive side effects. As a result of this, she is  unwilling to stay on pills long-term. Before of her four pregnancies and her  C section and her boggy slightly enlarged uterus, it is strongly suspected  she has adenomyosis and she now enters the hospital for total abdominal  hysterectomy for treatment of the above. She also notices that she gets pain  in the area suprapubically and this happened more depending on which oral  contraceptive she has been on. As noted above, she has been on several for  control of the above. She had an episode this spring where she had to be  admitted to the hospital at [facility]. The feeling was that she had  diverticulitis. She has seen Griffith Citron, M.D. in [place] since  then and at the moment does not appear to have active disease. Ultrasound  does not show any masses in the ovaries. We have done a bowel prep, however,  because we are concerned that she may have sigmoid colon involvement on the  left and she understands that we may have to do a left  salpingo-oophorectomy  for treatment of the above if it is adherent to the colon.   PAST MEDICAL HISTORY:  Previous tubal ligation, previous C section.   CURRENT MEDICATIONS:  Yasmin. She is allergic to no drugs. Please note she  has also been on a staph penicillin drug for the last several days because  of an area of hidradenitis under her left axilla which was excised on  Monday.   FAMILY HISTORY:  Grandfather had coronary artery disease, mother and father  have hypertension. She has several paternal aunts with uterine cancer.   SOCIAL HISTORY:  She is a nonsmoker and nondrinker.   REVIEW OF SYMPTOMS:  GENERAL:  Reveals a history of weight gain. HEENT:  Reveals a history of headaches.  CARDIOVASCULAR:  Reveals a history of  cardiac palpitations which has been assessed and not felt to be abnormal.  RESPIRATORY:  Negative. GI:  Reveals a history of occasional upper abdominal  pain with increasing gas.  MUSCULOSKELETAL:  Negative. GU:  Negative.  NEUROLOGIC/PSYCHIATRIC:  Negative. ALLERGIC/IMMUNOLOGIC/LYMPHATIC/ENDOCRINE:  Negative.   PHYSICAL EXAMINATION:  GENERAL:  The patient is a well-developed, well-  nourished female in no acute distress.  VITAL SIGNS:  Her blood pressure is 130/80, pulse is 80 and regular,  respirations 16 nonlabored, she is afebrile.  HEENT:  All within normal limits.  NECK:  Supple, trach in the midline. Thyroid not enlarged.  LUNGS:  Clear to P&A.  HEART:  No thrills, heaves or murmurs.  BREASTS:  No masses.  ABDOMEN:  Soft without guarding, rebound or masses.  PELVIC:  External and vaginal is normal. Cervix is clean. Pap smear shows no  atypia. The uterus is boggy and slightly enlarged. Adnexa are palpably  normal. Rectovaginal is confirmatory.  EXTREMITIES:  Within normal limits.   IMPRESSION:  Pelvic pain, dysmenorrhea, menorrhagia, adenomyosis strongly  suspected.   PLAN:  Total abdominal hysterectomy, possible left  salpingo-oophorectomy.      DLG/MEDQ  D:  08/16/2004  T:  08/16/2004  Job:  [id number]

## 2011-04-05 NOTE — Op Note (Signed)
NAME:  Allison White, Allison White NO.:  [id number]   MEDICAL RECORD NO.:  [id number]          PATIENT TYPE:  AMB   LOCATION:  [facility]                         FACILITY:  [facility]   PHYSICIAN:  Maretta Bees. Vonita Moss, M.D.DATE OF BIRTH:  1961-03-12   DATE OF PROCEDURE:  10/26/2004  DATE OF DISCHARGE:                                 OPERATIVE REPORT   PREOPERATIVE DIAGNOSIS:  Rule out interstitial cystitis.   POSTOPERATIVE DIAGNOSIS:  Rule out interstitial cystitis.   PROCEDURES:  1.  Cystoscopy.  2.  Hydraulic overdistention of bladder.  3.  Cold cup bladder biopsy and fulguration.   SURGEON:  Dr. Larey Dresser   ANESTHESIA:  General.   INDICATIONS:  This 50 year old white female has had frequency, pelvic pain,  and pain postvoiding.  She has had an abdominal hysterectomy for fibroids  about 10 weeks ago.  She is brought to the OR to rule out IC, based on  pelvic pain and symptom score of 17.   DESCRIPTION OF PROCEDURE:  The patient was brought to the operating room and  placed in lithotomy position.  External genitalia were prepped and draped in  the usual fashion.  She was cystoscoped, and the bladder was unremarkable.  She was filled and despite general anesthesia, was obviously with filling of  the bladder and with some urethral compression, filled her to 800 mL and  emptied the bladder and looked back in, and there were some mucosal  petechiae and hemorrhage on the trigone and a few other small petechiae  throughout the bladder.  These were minor changes and certainly not florid  changes of IC.  Bladder biopsies were obtained from the bladder base in two  different areas, and bladder biopsy sites were fulgurated with the Bugbee  electrode.  The bladder was emptied, the scope removed, and she was given a  B&O suppository per rectum and 30 mg of Toradol IV and taken to the recovery  room in good condition having tolerated the procedure well.     LJP/MEDQ  D:  10/26/2004   T:  10/26/2004  Job:  [id number]   cc:   Reuel Boom L. Eda Paschal, M.D.  [address], [address]  [place]  [place] [address]  Fax: [phone]

## 2011-04-05 NOTE — Discharge Summary (Signed)
NAME:  Allison White, Allison White NO.:  [id number]   MEDICAL RECORD NO.:  [id number]          PATIENT TYPE:  INP   LOCATION:  9318                          FACILITY:  [facility]   PHYSICIAN:  Daniel L. Gottsegen, M.D.DATE OF BIRTH:  1961-02-07   DATE OF ADMISSION:  08/16/2004  DATE OF DISCHARGE:  08/18/2004                                 DISCHARGE SUMMARY   The patient is a 50 year old female who is admitted to the hospital with  dysmenorrhea and DUB for total abdominal hysterectomy.  On the day of  admission, she was taken to the operating room and a total abdominal  hysterectomy was done without difficulty.  Findings were consistent with  adenomyosis.  Postoperatively she did well.  On the second postoperative  day, she was ready for discharge.  Discharge diet was soft.  Discharge  activity was ambulatory.  She will return to the office in 2 days for staple  removal.  Discharge medicine was Motrin p.r.n.  Pathology report is not  available at the time of dictation.   DISCHARGE DIAGNOSES:  1.  Dysmenorrhea.  2.  Dysfunctional uterine bleeding.  3.  Adenomyosis suspected.   CONDITION ON DISCHARGE:  Improved.      DLG/MEDQ  D:  08/18/2004  T:  08/20/2004  Job:  [id number]

## 2011-04-05 NOTE — Op Note (Signed)
NAME:  Allison White, Allison White NO.:  [id number]   MEDICAL RECORD NO.:  [id number]          PATIENT TYPE:  INP   LOCATION:  9399                          [facility]ILITY:  [facility]   PHYSICIAN:  Daniel L. Gottsegen, M.D.DATE OF BIRTH:  1961/03/31   DATE OF PROCEDURE:  08/16/2004  DATE OF DISCHARGE:                                 OPERATIVE REPORT   PREOPERATIVE DIAGNOSES:  1.  Dysfunctional uterine bleeding.  2.  Pelvic pain.  3.  Dysmenorrhea, adenomyosis suspected.   POSTOPERATIVE DIAGNOSES:  1.  Dysfunctional uterine bleeding.  2.  Pelvic pain.  3.  Dysmenorrhea, adenomyosis suspected.   OPERATION:  Total abdominal hysterectomy.   ANESTHESIA:  General endotracheal.   SURGEON:  Daniel L. Eda Paschal, M.D.   FIRST ASSISTANT:  Raynald Kemp, M.D.   FINDINGS:  At the time of surgery, the patient's uterus was slightly  enlarged and boggy.  It weighed 150 g when it was weighed which was  approximately twice normal size.  Ovaries, fallopian tubes and pelvic  peritoneum were free of disease.  There was no sequelae that I could see  from her previous attack of diverticulitis.  The rest of the exploration of  the abdomen was normal.   PROCEDURE IN DETAIL:  After adequate general endotracheal anesthesia, the  patient was placed in the supine position, prepped and draped in the usual  sterile manner.  A Foley catheter was inserted in the patient's bladder.  A  Pfannenstiel incision was made.  The fascia was opened transversely.  The  peritoneum was entered vertically.  Subcutaneous bleeders were clamped and  Bovied if encountered.  When the peritoneal cavity was opened, the above  findings were noted.  There was again no evidence of her previous  diverticulitis.  The bowel was packed off and then the round ligaments were  Bovied and cut.  The ovaries appeared normal so they were left in place and  the utero-ovarian ligaments and fallopian tubes were clamped, cut, and  doubly suture  ligated.  The bladder flap was developed by sharp dissection  without difficulty even though there were some adhesions from her previous C-  section.  The uterine arteries were clamped, cut, and doubly suture ligated.  The parametrium was taken down in successive bites by clamping, cutting, and  suture ligating.  Cervicovaginal junction was identified, entered with sharp  dissection, and the uterus was sent to pathology for tissue diagnosis.  Suture material for all the above-mentioned pedicles was #1 chromic cat gut.  Vaginal angles were sutured with #1 chromic cat gut incorporating cardinal  ligaments and uterosacral ligaments for good vault support and then the cuff  was whip stitched with a running locking 360 degree suture of 0 Vicryl.  Copious irrigation was done with Ringer's lactate.  Two sponge, needle,  instrument counts were correct.  The peritoneum was closed with 0 Vicryl.  Sub and superfascial spaces  were copiously irrigated and then the fascia was closed with two running 0  Vicryl.  The skin was finally closed with staples.  Estimated blood loss for  the entire procedure  was 400 mL with none replaced.  The patient tolerated  the procedure well and left the operating room in satisfactory condition  draining clear urine from her Foley catheter.      DLG/MEDQ  D:  08/16/2004  T:  08/16/2004  Job:  [id number]

## 2011-08-16 LAB — URINALYSIS, ROUTINE W REFLEX MICROSCOPIC
Bilirubin Urine: NEGATIVE
Glucose, UA: NEGATIVE
Ketones, ur: 15 — AB
Protein, ur: NEGATIVE
Urobilinogen, UA: 0.2

## 2011-08-16 LAB — COMPREHENSIVE METABOLIC PANEL
ALT: 19
ALT: 23
Alkaline Phosphatase: 68
Alkaline Phosphatase: 71
BUN: 10
BUN: 12
CO2: 24
CO2: 25
Chloride: 105
Chloride: 105
GFR calc non Af Amer: >60
Glucose, Bld: 81
Glucose, Bld: 98
Potassium: 3.5
Potassium: 4.6
Sodium: 138
Sodium: 140
Total Bilirubin: 0.5
Total Bilirubin: 1.1
Total Protein: 7.2

## 2011-08-16 LAB — CBC
HCT: 43
HCT: 45
Hemoglobin: 15.1 — ABNORMAL HIGH
Hemoglobin: 15.3 — ABNORMAL HIGH
RBC: 4.65
RBC: 4.76
WBC: 9.5

## 2011-08-16 LAB — CROSSMATCH: Antibody Screen: NEGATIVE

## 2011-08-16 LAB — DIFFERENTIAL
Basophils Absolute: 0
Basophils Relative: 0
Eosinophils Absolute: 0.1
Monocytes Relative: 8
Neutro Abs: 6.4
Neutrophils Relative %: 67

## 2011-08-16 LAB — APTT: aPTT: 29

## 2011-08-16 LAB — PROTIME-INR
INR: 1
Prothrombin Time: 13.8

## 2011-08-16 LAB — URINE MICROSCOPIC-ADD ON

## 2011-08-16 LAB — C3 COMPLEMENT: C3 Complement: 141

## 2011-08-16 LAB — SEDIMENTATION RATE: Sed Rate: 6

## 2011-08-16 LAB — ABO/RH: ABO/RH(D): B POS

## 2011-08-16 LAB — LIPID PANEL
Cholesterol: 227 — ABNORMAL HIGH
HDL: 56
Total CHOL/HDL Ratio: 4.1
VLDL: 23

## 2011-08-16 LAB — ANA: Anti Nuclear Antibody(ANA): NEGATIVE

## 2011-08-16 LAB — HOMOCYSTEINE: Homocysteine: 5.1

## 2012-07-24 ENCOUNTER — Ambulatory Visit: Payer: Self-pay | Admitting: Cardiovascular Disease

## 2012-08-13 ENCOUNTER — Ambulatory Visit: Payer: Self-pay | Admitting: Cardiovascular Disease

## 2012-08-27 ENCOUNTER — Encounter: Payer: Self-pay | Admitting: Cardiovascular Disease

## 2012-08-27 ENCOUNTER — Encounter: Payer: Self-pay | Admitting: *Deleted

## 2012-08-27 DIAGNOSIS — I10 Essential (primary) hypertension: Secondary | ICD-10-CM | POA: Insufficient documentation

## 2012-08-27 DIAGNOSIS — E785 Hyperlipidemia, unspecified: Secondary | ICD-10-CM | POA: Insufficient documentation

## 2012-08-27 DIAGNOSIS — E7800 Pure hypercholesterolemia, unspecified: Secondary | ICD-10-CM | POA: Insufficient documentation

## 2012-08-27 DIAGNOSIS — R42 Dizziness and giddiness: Secondary | ICD-10-CM | POA: Insufficient documentation

## 2012-08-27 DIAGNOSIS — R519 Headache, unspecified: Secondary | ICD-10-CM | POA: Insufficient documentation

## 2012-08-27 DIAGNOSIS — R51 Headache: Secondary | ICD-10-CM | POA: Insufficient documentation

## 2012-08-28 NOTE — Progress Notes (Signed)
Patient ID: Allison White, female   DOB: 27-Jun-1961, 51 y.o.   MRN: [id number] No show

## 2013-07-22 ENCOUNTER — Ambulatory Visit (INDEPENDENT_AMBULATORY_CARE_PROVIDER_SITE_OTHER): Payer: BC Managed Care – PPO | Admitting: Neurology

## 2013-07-22 ENCOUNTER — Encounter: Payer: Self-pay | Admitting: Neurology

## 2013-07-22 VITALS — BP 108/72 | HR 96 | Temp 98.2°F | Resp 20 | Ht 65.0 in | Wt 212.0 lb

## 2013-07-22 DIAGNOSIS — I720 Aneurysm of carotid artery: Secondary | ICD-10-CM

## 2013-07-22 DIAGNOSIS — R51 Headache: Secondary | ICD-10-CM

## 2013-07-22 DIAGNOSIS — I671 Cerebral aneurysm, nonruptured: Secondary | ICD-10-CM

## 2013-07-22 DIAGNOSIS — R519 Headache, unspecified: Secondary | ICD-10-CM

## 2013-07-22 LAB — BASIC METABOLIC PANEL
BUN: 18 mg/dL (ref 6–23)
Creatinine, Ser: 0.9 mg/dL (ref 0.4–1.2)
GFR: 71.82 mL/min (ref >60.00)
Glucose, Bld: 89 mg/dL (ref 70–99)
Potassium: 4.7 mEq/L (ref 3.5–5.1)

## 2013-07-22 NOTE — Patient Instructions (Addendum)
1.  Obtain MRI brain, MRA circle of willis and carotids 2.  Based on imaging, may consider headache preventive medication 3.  Return to clinic in 22-months.  Your MRI and MRA's are scheduled for Monday, Sept. 15th at 2:00pm.  Please arrive to [facility], first floor admitting by 1:45pm.  Entrance [facility]) [phone].

## 2013-07-22 NOTE — Progress Notes (Signed)
[facility] Note - Initial Visit   Date: July 22, 2013  Allison White MRN: [id number] DOB: 1961/10/28  Dear Dr Marina Goodell:  Thank you for your kind referral of Allison White for consultation of headaches and dizziness. Although her history is well known to you, please allow [place] to reiterate it for the purpose of our medical record. The patient was accompanied to the clinic by mother.   History of Present Illness: Allison White is a 52 y.o. 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    In 2009, she develops faily abrupt onset of headaches and tongue weakness which prompted an evaluation by [facility] who ordered MRI/MRA which showed a large left internal carotid artery pseudoaneurysm with questionable old healed right internal carotid artery dissection.  She was treated with aspirin and plavix and later underwent stent placement for the left internal carotid artery pseudoaneurysm performed on July 14, 2008, by Dr. Corliss Skains. Since placement of the initial stent, there had been no significant healing of the pseudoaneurysm.  Because of persistent headaches, she had a second stent for a symptomatic left internal carotid artery pseudoaneurysm that was performed on October 26, 2009.   Her last angiogram was in 2010 which she had a second stent placed.  Additionally, due to concern of fibromuscular muscular dysplasia on her initial angiogram, she has been to the [facility] clinic and saw Dr. Pennie Rushing who did not feel her imaging was strongly suggestive of FMD.  She has has had chronic headaches since then and been evaluated at the pain clinic in 2013 who recommended "electrical shock pulses" but the distance was too far for the patient to travel.   The severity  of her headaches and dysarthria improved after initial stenting, but she continues to have constant sharp pain that starts behind her left ear and occasionally radiates into her neck and shoulder.  Pain is sharp and pounding.  Ranked as 2/10 for which she takes a daily vicodin.  When severe, pain is 10+/10.  It lasts about 2-3 days and she stays in bed and tries to do nothing. Frequency is 1-3/month.  She has tried percocet, vicodin, valium, verapamil, amitriptyline, gabapentin, cymbalta.  She has not tried depakote, topamax, or nerve blocks, or botox. She has 0 headache free days in a month since 2009.  Currently, she takes one half tablet of Vicodin daily and additional as needed for pain but denies significant pain relief.   Over the past 10 months, she has developed new right sided headaches, still pounding in character.  She gets intermittent numbness over her left face and left pinky and slurs her words more when the pain is worse.  Denies any associated weakness.  Sleep improves her pain.  She endorses photosensitivity.  She get vertigo about 3-4 times per month, lasting 43min-1hr, which improves itself.   Abrupt head turning worsens symptoms.  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  Out-side paper records, electronic medical record, and images have been reviewed where available.    Past Medical History  Diagnosis Date  . Vertigo   . Headache(784.0)   . Hyperlipidemia   . HTN (hypertension)   . Nonruptured cerebral aneurysm, internal carotid artery     Left side, stent placement (2009)  Past Surgical History  Procedure Laterality Date  . Cerebral aneurysm repair Left 2009  . Abdominal hysterectomy       Medications:  Current Outpatient Prescriptions on File Prior to Visit  Medication Sig Dispense Refill  . aspirin 325 MG tablet Take 325 mg by mouth daily.      [name] lisinopril  (PRINIVIL,ZESTRIL) 5 MG tablet Take 1 tablet by mouth Daily.       No current facility-administered medications on file prior to visit.    Allergies:  Allergies  Allergen Reactions  . Plavix [Clopidogrel Bisulfate]     Family History: Family History  Problem Relation Age of Onset  . Hypertension Brother   . Cancer Father     Prostate  . Thyroid disease Mother   . Cerebral aneurysm Paternal Grandmother     Nonruptured    Social History: History   Social History  . Marital Status: Married    Spouse Name: N/A    Number of Children: N/A  . Years of Education: N/A   Occupational History  . Not on file.   Social History Main Topics  . Smoking status: Former Games developer  . Smokeless tobacco: Not on file     Comment: Quit 30 years ago  . Alcohol Use: Yes     Comment: Occasionally  . Drug Use: No  . Sexual Activity: Not on file   Other Topics Concern  . Not on file   Social History Narrative   Working in Oceanographer at [employer].  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      Review of Systems:  CONSTITUTIONAL: No fevers, chills, night sweats, or weight loss.   EYES: +visual changes  ENT: No hearing changes.  No history of nose bleeds.   RESPIRATORY: No cough, wheezing and shortness of breath.   CARDIOVASCULAR: Negative for chest pain, and palpitations.   GI: Negative for abdominal discomfort, blood in stools or black stools.  No recent change in bowel habits.   GU:  No history of incontinence.   MUSCLOSKELETAL: No history of joint pain or swelling.  No myalgias.   SKIN: Negative for lesions, rash, and itching.   HEMATOLOGY/ONCOLOGY: Negative for prolonged bleeding, bruising easily, and swollen nodes.  No history of cancer.   ENDOCRINE: Negative for cold or heat intolerance, polydipsia or goiter.   PSYCH:  No depression or anxiety symptoms.   NEURO: As Above.   Vital Signs:  BP 108/72  Pulse 96  Temp(Src) 98.2 F (36.8 C) (Oral)  Resp 20  Ht 5\' 5"   (1.651 m)  Wt 212 lb (96.163 kg)  BMI 35.28 kg/m2 Pain Scale: 3 on a scale of 0-10   General Medical Exam: General:  Well appearing, comfortable, tearful during the examination.   Eyes/ENT: see cranial nerve examination.   Neck: No masses appreciated.  Full range of motion without tenderness.  No carotid bruits. Respiratory:  Clear to auscultation, good air entry bilaterally.   Cardiac:  Regular rate and rhythm, no murmur.   GI:  Soft, non-tender, non-distended abdomen.       Extremities:  No deformities, edema, or skin discoloration. Good capillary refill.   Skin:  Skin color, texture, turgor normal. No rashes or lesions.  Neurological Exam: MENTAL STATUS including orientation to time, place, person, recent and remote memory, attention span and concentration, language, and fund of knowledge is normal.  Speech is not dysarthric.  CRANIAL NERVES: II:  No visual field defects.  Unremarkable fundi.   III-IV-VI:  Pupils equal round and reactive to light.  Normal conjugate, extra-ocular eye movements in all directions of gaze.  No nystagmus.  No ptosis prior to or post sustained upgaze.   V:  Normal facial sensation.  Jaw jerk is absent.   VII:  Normal facial symmetry and movements.  No pathologic facial reflexes.  VIII:  Normal hearing and vestibular function.   IX-X:  Normal palatal movement.   XI:  Normal shoulder shrug and head rotation.   XII:  Normal tongue strength and range of motion, no deviation or fasciculation.  MOTOR:  No atrophy, fasciculations or abnormal movements.  No pronator drift.  Tone is normal.    Right Upper Extremity:    Left Upper Extremity:    Deltoid  5/5   Deltoid  5/5   Biceps  5/5   Biceps  5/5   Triceps  5/5   Triceps  5/5   Wrist extensors  5/5   Wrist extensors  5/5   Wrist flexors  5/5   Wrist flexors  5/5   Finger extensors  5/5   Finger extensors  5/5   Finger flexors  5/5   Finger flexors  5/5   Dorsal interossei  5/5   Dorsal interossei  5/5    Abductor pollicis  5/5   Abductor pollicis  5/5   Tone (Ashworth scale)  0  Tone (Ashworth scale)  0   Right Lower Extremity:    Left Lower Extremity:    Hip flexors  5/5   Hip flexors  5/5   Hip extensors  5/5   Hip extensors  5/5   Knee flexors  5/5   Knee flexors  5/5   Knee extensors  5/5   Knee extensors  5/5   Dorsiflexors  5/5   Dorsiflexors  5/5   Plantarflexors  5/5   Plantarflexors  5/5   Toe extensors  5/5   Toe extensors  5/5   Toe flexors  5/5   Toe flexors  5/5   Tone (Ashworth scale)  0  Tone (Ashworth scale)  0   MSRs:  Right                                                                 Left brachioradialis 3+  brachioradialis 3+  biceps 3+  biceps 3+  triceps 3+  triceps 3+  patellar 3+  patellar 3+  ankle jerk 2+  ankle jerk 2+  Hoffman no  Hoffman yes  plantar response down  plantar response down  Crossed adductors present.  No clonus.  SENSORY:  Normal and symmetric perception of light touch, pinprick, vibration, and proprioception.  Romberg's sign positive.  No extinction on double simultaneous stimulation.    COORDINATION/GAIT: Mild dysmetria with finger-to- nose-finger and heel-to-shin testing bilaterally.  Intact rapid alternating movements bilaterally.  Able to rise from a chair without using arms.  Gait narrow based and stable, mild external rotation of the right leg. Tandem and stressed gait intact.    IMPRESSION: Ms. 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  Her exam is notable for slightly brisk but symmetric reflexes and the absence of  other UMN process makes them less likely to be pathological.  Normal sensory and motor exam.  Mild dysmetria and positive Rhomberg.  Based on her history and new right-sided headache with intermittent neurological deficits, she warrants further imaging of her brain and vessels.  She  also meets criteria for chronic daily headaches and I plan to start her on preventative treatment (likely topamax) once her imaging results are back.  Other options to consider include depakote, effexor, nerve blocks, or botox.    PLAN/RECOMMENDATIONS:  1.  Obtain MRI brain, MRA circle of willis and carotids 2.  Based on imaging, may consider headache preventive medication  3.  Return to clinic in 35-months.   Thank you for allowing me to participate in patient's care.  If I can answer any additional questions, I would be pleased to do so.    Sincerely,    Donika K. Allena Katz, DO

## 2013-07-23 DIAGNOSIS — R519 Headache, unspecified: Secondary | ICD-10-CM | POA: Insufficient documentation

## 2013-07-23 DIAGNOSIS — I720 Aneurysm of carotid artery: Secondary | ICD-10-CM | POA: Insufficient documentation

## 2013-08-02 ENCOUNTER — Ambulatory Visit (HOSPITAL_COMMUNITY): Payer: BC Managed Care – PPO

## 2013-08-02 ENCOUNTER — Ambulatory Visit (HOSPITAL_COMMUNITY): Admission: RE | Admit: 2013-08-02 | Payer: BC Managed Care – PPO | Source: Ambulatory Visit

## 2013-08-02 ENCOUNTER — Ambulatory Visit (HOSPITAL_COMMUNITY)
Admission: RE | Admit: 2013-08-02 | Discharge: 2013-08-02 | Disposition: A | Discharge status: Home / Self Care | Payer: BC Managed Care – PPO | Source: Ambulatory Visit | Attending: Neurology | Admitting: Neurology

## 2013-08-02 DIAGNOSIS — R519 Headache, unspecified: Secondary | ICD-10-CM

## 2013-08-02 DIAGNOSIS — I671 Cerebral aneurysm, nonruptured: Secondary | ICD-10-CM

## 2013-08-03 ENCOUNTER — Telehealth: Payer: Self-pay

## 2013-08-03 MED ORDER — DIAZEPAM 5 MG PO TABS: ORAL_TABLET | ORAL | Status: DC

## 2013-08-03 NOTE — Telephone Encounter (Signed)
She can take valium 5mg  30 min prior to getting imaging.  Rx printed and signed.  Otherwise, will need to get CT/A brain and carotids.  Thanks.

## 2013-08-03 NOTE — Telephone Encounter (Signed)
Pt was unable to complete MRI/MRA's due to extreme vertigo and nausea every time she laid back to have them done.  Recommendations?

## 2013-08-05 ENCOUNTER — Telehealth: Payer: Self-pay

## 2013-08-05 DIAGNOSIS — I671 Cerebral aneurysm, nonruptured: Secondary | ICD-10-CM

## 2013-08-05 DIAGNOSIS — G8929 Other chronic pain: Secondary | ICD-10-CM

## 2013-08-05 NOTE — Telephone Encounter (Signed)
Pt has decided she does not want to have MRI at all, would prefer CT scan done at [facility].

## 2013-08-05 NOTE — Telephone Encounter (Signed)
Please arrange for her to have CT head, CTA brain and CTA neck.  Thanks.

## 2013-08-05 NOTE — Telephone Encounter (Signed)
Pt now wants to go to [facility] facility, will schedule for there and notify pt.  Appt Sept. 24 at 2:00pm [facility].

## 2013-08-06 NOTE — Telephone Encounter (Signed)
Pt aware.

## 2013-08-11 ENCOUNTER — Ambulatory Visit (HOSPITAL_COMMUNITY)
Admission: RE | Admit: 2013-08-11 | Discharge: 2013-08-11 | Disposition: A | Discharge status: Home / Self Care | Payer: BC Managed Care – PPO | Source: Ambulatory Visit | Attending: Neurology | Admitting: Neurology

## 2013-08-11 ENCOUNTER — Other Ambulatory Visit: Payer: Self-pay | Admitting: Neurology

## 2013-08-11 ENCOUNTER — Encounter (HOSPITAL_COMMUNITY): Payer: Self-pay

## 2013-08-11 DIAGNOSIS — R51 Headache: Secondary | ICD-10-CM | POA: Insufficient documentation

## 2013-08-11 DIAGNOSIS — I7789 Other specified disorders of arteries and arterioles: Secondary | ICD-10-CM | POA: Insufficient documentation

## 2013-08-11 DIAGNOSIS — R42 Dizziness and giddiness: Secondary | ICD-10-CM | POA: Insufficient documentation

## 2013-08-11 DIAGNOSIS — G8929 Other chronic pain: Secondary | ICD-10-CM

## 2013-08-11 DIAGNOSIS — I671 Cerebral aneurysm, nonruptured: Secondary | ICD-10-CM | POA: Insufficient documentation

## 2013-08-11 MED ORDER — IOHEXOL 350 MG/ML SOLN
80.0000 mL | Freq: Once | INTRAVENOUS | Status: AC | PRN
  Administered 2013-08-11: 80 mL via INTRAVENOUS

## 2013-08-27 ENCOUNTER — Ambulatory Visit (INDEPENDENT_AMBULATORY_CARE_PROVIDER_SITE_OTHER): Payer: BC Managed Care – PPO | Admitting: Neurology

## 2013-08-27 ENCOUNTER — Encounter: Payer: Self-pay | Admitting: Neurology

## 2013-08-27 VITALS — BP 120/78 | HR 82 | Temp 97.9°F | Resp 12 | Wt 213.7 lb

## 2013-08-27 DIAGNOSIS — R42 Dizziness and giddiness: Secondary | ICD-10-CM

## 2013-08-27 DIAGNOSIS — R51 Headache: Secondary | ICD-10-CM

## 2013-08-27 DIAGNOSIS — I720 Aneurysm of carotid artery: Secondary | ICD-10-CM

## 2013-08-27 DIAGNOSIS — R519 Headache, unspecified: Secondary | ICD-10-CM

## 2013-08-27 MED ORDER — TOPIRAMATE 25 MG PO TABS: ORAL_TABLET | ORAL | Status: DC

## 2013-08-27 NOTE — Patient Instructions (Signed)
1.  Titration schedule for topiramate was provided as follows:     AM   PM   Week 1:    25mg    Week 2:  25 mg   25mg    Week 3:  25mg    50mg    Week 4:  50mg    50mg  2.  Vestibular therapy  3.  Call with update in 4-weeks 4.  Literature on headache supplements 5.  Return to clinic in 62-months

## 2013-08-27 NOTE — Progress Notes (Signed)
[facility] Neurology Division  Follow-up Visit   Date: 08/27/2013   Allison White MRN: [id number] DOB: 1961/07/16   Interim History: Allison White is a 52 y.o. 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  She was last seen in the clinic on 07/22/2013.  She is accompanied by mother.  She remains clinically unchanged, with some good headache days and some bad ones. Today, she reports doing well and is able to walk straight.  She missed two days from work this week because of the gait unsteadiness and dizziness.  She is able to tolerate the pain and currently takes vicodin.  Since her last visit, she had CT/A brain and carotids which showed patent L ICA stent with no expansion of fusiform L ICA aneurysm.   History of present illness:  In 2009, she developed fairly abrupt onset of headaches and tongue weakness which prompted an evaluation by [facility] who ordered MRI/MRA which showed a large left internal carotid artery pseudoaneurysm with questionable old healed right internal carotid artery dissection. She was treated with aspirin and plavix and later underwent stent placement for the left internal carotid artery pseudoaneurysm performed on July 14, 2008, by Dr. Corliss Skains. Since placement of the initial stent, there had been no significant healing of the pseudoaneurysm. Because of persistent headaches, she had a second stent for a symptomatic left internal carotid artery pseudoaneurysm that was performed on October 26, 2009.  Her last angiogram was in 2010 which she had a second stent placed. Additionally, due to concern of fibromuscular muscular dysplasia on her initial angiogram, she has been to the [facility] clinic and saw Dr. Pennie Rushing who did not feel her  imaging was strongly suggestive of FMD. She has chronic headaches since then and been evaluated at the pain clinic in 2013 who recommended "electrical shock pulses" but the distance was too far for the patient to travel.   The severity of her headaches and dysarthria improved after initial stenting, but she continues to have constant sharp pain that starts behind her left ear and occasionally radiates into her neck and shoulder. Pain is sharp and pounding. Ranked as 2/10 for which she takes a daily vicodin. When severe, pain is 10+/10. It lasts about 2-3 days and she stays in bed and tries to do nothing. Frequency is 1-3/month. She has tried percocet, vicodin, valium, verapamil, amitriptyline, gabapentin, cymbalta. She has not tried depakote, topamax, or nerve blocks, or botox. She has 0 headache free days in a month since 2009. Currently, she takes one half tablet of Vicodin daily and additional as needed for pain but denies significant pain relief.   Over the past 10 months, she has developed new right sided headaches, still pounding in character. She gets intermittent numbness over her left face and left pinky and slurs her words more when the pain is worse. Denies any associated weakness. Sleep improves her pain. She endorses photosensitivity. She gets vertigo about 3-4 times per month, lasting 19min-1hr, which improves itself. Abrupt head turning worsens symptoms.    Medications:  Current Outpatient Prescriptions on File Prior to Visit  Medication Sig Dispense Refill  . aspirin 325 MG tablet Take 325 mg by mouth daily.      [name] HYDROcodone-acetaminophen (NORCO/VICODIN) 5-325 MG per tablet Take 1 tablet by mouth every 6 (six) hours as needed for pain.      [name]  lisinopril (PRINIVIL,ZESTRIL) 5 MG tablet Take 1 tablet by mouth Daily.      . diazepam (VALIUM) 5 MG tablet Take one tablet 30 minutes prior to MRI.  2 tablet  0   No current facility-administered medications on file prior to visit.     Allergies:  Allergies  Allergen Reactions  . Plavix [Clopidogrel Bisulfate]      Review of Systems:  CONSTITUTIONAL: No fevers, chills, night sweats, or weight loss.  + dizziness EYES: No visual changes or eye pain ENT: No hearing changes.  No history of nose bleeds.   RESPIRATORY: No cough, wheezing and shortness of breath.   CARDIOVASCULAR: Negative for chest pain, and palpitations.   GI: Negative for abdominal discomfort, blood in stools or black stools.  No recent change in bowel habits.   GU:  No history of incontinence.   MUSCLOSKELETAL: No history of joint pain or swelling.  No myalgias.   SKIN: Negative for lesions, rash, and itching.   HEMATOLOGY/ONCOLOGY: Negative for prolonged bleeding, bruising easily, and swollen nodes.  No history of cancer.   ENDOCRINE: Negative for cold or heat intolerance, polydipsia or goiter.   PSYCH:  No depression or anxiety symptoms.   NEURO: As Above.   Vital Signs:  BP 120/78  Pulse 82  Temp(Src) 97.9 F (36.6 C)  Resp 12  Wt 213 lb 11.2 oz (96.934 kg)  BMI 35.56 kg/m2    Neurological Exam: MENTAL STATUS including orientation to time, place, person, recent and remote memory, attention span and concentration, language, and fund of knowledge is normal.  Speech is not dysarthric.  CRANIAL NERVES: II:  No visual field defects. III-IV-VI: Pupils equal round and reactive to light.  Normal conjugate, extra-ocular eye movements in all directions of gaze.  No nystagmus.  No ptosis prior to or post sustained upgaze.   V:  Normal facial sensation.   VII:  Normal facial symmetry and movements.   VIII:  Normal hearing and vestibular function.   IX-X:  Normal palatal movement.   XI:  Normal shoulder shrug and head rotation.   XII:  Normal tongue strength and range of motion, no deviation or fasciculation.  MOTOR:  No atrophy, fasciculations or abnormal movements.  No pronator drift.  Tone is normal.    Right Upper Extremity:    Left  Upper Extremity:    Deltoid  5/5   Deltoid  5/5   Biceps  5/5   Biceps  5/5   Triceps  5/5   Triceps  5/5   Wrist extensors  5/5   Wrist extensors  5/5   Wrist flexors  5/5   Wrist flexors  5/5   Finger extensors  5/5   Finger extensors  5/5   Finger flexors  5/5   Finger flexors  5/5   Dorsal interossei  5/5   Dorsal interossei  5/5   Abductor pollicis  5/5   Abductor pollicis  5/5   Tone (Ashworth scale)  0  Tone (Ashworth scale)  0   Right Lower Extremity:    Left Lower Extremity:    Hip flexors  5/5   Hip flexors  5/5   Hip extensors  5/5   Hip extensors  5/5   Knee flexors  5/5   Knee flexors  5/5   Knee extensors  5/5   Knee extensors  5/5   Dorsiflexors  5/5   Dorsiflexors  5/5   Plantarflexors  5/5   Plantarflexors  5/5  Toe extensors  5/5   Toe extensors  5/5   Toe flexors  5/5   Toe flexors  5/5   Tone (Ashworth scale)  0  Tone (Ashworth scale)  0   MSRs:  Right                                                                 Left brachioradialis 3+  brachioradialis 3+  biceps 3+  biceps 3+  triceps 3+  triceps 3+  patellar 3+  patellar 3+  ankle jerk 2+  ankle jerk 2+  Hoffman no  Hoffman yes  plantar response down  plantar response down  Crossed adductors bilaterally.  No ankle clonus.  SENSORY:  Normal and symmetric perception of light touch, pinprick, vibration, and proprioception.  Romberg's sign positive.    COORDINATION/GAIT: Normal finger-to- nose-finger and heel-to-shin.  Intact rapid alternating movements bilaterally.  Able to rise from a chair without using arms.  Gait narrow based and stable. She is able to heel and toe walk, but unable to perform tandem gait.     Data: CT/A head 08/11/2013: 1. The left ICA petrous segment stent is patent.  2. Normal variant CTA head without evidence for significant proximal  stenosis, aneurysm, or branch vessel occlusion.   CT/A neck 08/11/2013: 1. Tandem left ICA stents are patent without evidence for expansion of  the fusiform left ICA aneurysm.  2. Slight irregularity in the high cervical right internal carotid artery suggesting fibromuscular dysplasia describes. This finding is new since the prior exam.  3. Focal tortuosity and looping of the vertebral arteries bilaterally at the C2 level.  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  Examination remains stable with brisk but symmetric reflexes and the absence of other UMN process makes them less likely to be pathological. Tandem gait is unsteady and Rhomberg is positive.  Recent CT imaging (patient refused MRI) shows patent L ICA stents and stable fusiform aneurysm.  Although there is note of findings suggestive of FMD, which has been told to her in the past and she had evaluation at [facility] by Dr. Bobbye Charleston, where they did not feel her imaging findings were supportive of this.  The management of FMD is with routine BP surveillance and ACE inhibitors and she is on lisinopril 5mg  daily. She apparently has not tolerated higher doses.    From a headache perspective, her imaging is stable and she has chronic daily headaches which needs to be medically optimized since she currently takes vicodin.  She has tried numerous medications in the past (percocet, vicodin, valium, verapamil, amitriptyline, gabapentin, cymbalta). Options not tried include:  depakote, topamax, or nerve blocks, or botox.  After discussion, we decided to give a trial of topiramate.  I also discussed options of supplements, such as magnesium which can be added in a few weeks.  Side effect of medications was discussed at length.  No history of kidney stones and she has no plans for pregnancy.   PLAN/RECOMMENDATIONS:  1.  Titration schedule for topiramate was provided as follows:     AM   PM   Week 1:  25mg    Week 2:  25 mg    25mg    Week 3:  25mg    50mg    Week 4:  50mg    50mg  2.  Vestibular therapy - gentle neck exercises 3.  Patient to call with update in 4-weeks 4.  Literature on headache supplements 5.  Return to clinic in 65-months  The duration of this appointment visit was 30 minutes of face-to-face time with the patient.  Greater than 50% of this time was spent in counseling, explanation of diagnosis, planning of further management, and coordination of care.   Thank you for allowing me to participate in patient's care.  If I can answer any additional questions, I would be pleased to do so.    Sincerely,    Donika K. Allena Katz, DO

## 2013-09-06 ENCOUNTER — Telehealth: Payer: Self-pay | Admitting: Neurology

## 2013-09-06 NOTE — Telephone Encounter (Signed)
Called pt to discuss med concerns. She said the first week of taking Topamax she had dark colored diarrhea, then yesterday she started taking two pills a day and within 2 hours after taking the second pill she had pain in between her shoulder blades and upper stomach almost like gas pain. She has tried taking Gas-X but it isn't helping. Now her stool is light tan colored and soft but not diarrhea. The medicine is helping her headaches but she wants to know if these are normal side effects and if there are any suggestions from the doctor on how to relieve the stomach pain.

## 2013-09-06 NOTE — Telephone Encounter (Signed)
Please call patient at work # [phone]. Re: questions about side effects w/ new meds. / Sherri S.

## 2013-09-06 NOTE — Telephone Encounter (Signed)
Instructed patient to stop topamax for at least 3-days until her abdominal and urinary symptoms resolve.  At that time, she can start with one half tablet for a week, then increase to one full tablet thereafter. Since she reports having significant improvement of her headache and dizziness, I will titrate this medication very slowly and keep her on the lowest dose that provides relief.  She is in agreement of plan. All questions were answered.  Jaquese Irving K. Allena Katz, DO

## 2013-09-21 ENCOUNTER — Ambulatory Visit: Payer: BC Managed Care – PPO | Admitting: Neurology

## 2013-09-23 ENCOUNTER — Telehealth: Payer: Self-pay

## 2013-09-23 NOTE — Telephone Encounter (Signed)
Please let patient know we can discuss other options at her next visit.  She has schedule sooner to 12/16, if she would like.  Donika K. Allena Katz, DO

## 2013-09-23 NOTE — Telephone Encounter (Signed)
Pt called to inform Dr.Patel that she had to stop taking the topamax because it was upsetting her stomach too much. She wants to know what she should do from here? Please advise.

## 2013-09-23 NOTE — Telephone Encounter (Signed)
Called pt and left voicemail relaying your message. 

## 2013-09-24 ENCOUNTER — Telehealth: Payer: Self-pay | Admitting: Neurology

## 2013-11-02 ENCOUNTER — Ambulatory Visit: Payer: BC Managed Care – PPO | Admitting: Neurology

## 2013-12-03 ENCOUNTER — Encounter: Payer: Self-pay | Admitting: Neurology

## 2013-12-03 ENCOUNTER — Ambulatory Visit (INDEPENDENT_AMBULATORY_CARE_PROVIDER_SITE_OTHER): Payer: BC Managed Care – PPO | Admitting: Neurology

## 2013-12-03 VITALS — BP 132/80 | HR 70 | Temp 97.7°F | Resp 14 | Ht 65.0 in | Wt 213.1 lb

## 2013-12-03 DIAGNOSIS — R519 Headache, unspecified: Secondary | ICD-10-CM

## 2013-12-03 DIAGNOSIS — I671 Cerebral aneurysm, nonruptured: Secondary | ICD-10-CM

## 2013-12-03 DIAGNOSIS — R51 Headache: Secondary | ICD-10-CM

## 2013-12-03 MED ORDER — ZONISAMIDE 25 MG PO CAPS: ORAL_CAPSULE | ORAL | Status: DC

## 2013-12-03 NOTE — Patient Instructions (Addendum)
Start taking zonisamide 25mg  tablets as follows:  Week 1:  One tablet (25mg  daily)  Week 2: Two tablets (50mg  daily)  Week 3:  Three tablets (75mg  daily)  Week 4:  Four tablets (100mg  daily) Call or send me a MyChart message in 4 weeks to let me know how you are tolerating the medication, so I can send a new prescription Return to clinic in 87-months

## 2013-12-03 NOTE — Progress Notes (Signed)
[facility] Division  Follow-up Visit   Date: 12/03/2013   Allison White MRN: [id number] DOB: 1961/03/28   Interim History: Allison White is a 53 y.o. 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  She was last seen in the clinic on 08/27/2013.    She tried topiramate which did wonders for headache but then she had developed abdominal pain and diarrhea so stopped it.  Headaches quickly returned after stopping topamax, but her vertigo has essentially resolved.  She has misses about 4-7 days of work per month her headaches.   History of present illness:  In 2009, she developed fairly abrupt onset of headaches and tongue weakness which prompted an evaluation by [facility] who ordered MRI/MRA which showed a large left internal carotid artery pseudoaneurysm with questionable old healed right internal carotid artery dissection. She was treated with aspirin and plavix and later underwent stent placement for the left internal carotid artery pseudoaneurysm performed on July 14, 2008, by Dr. Estanislado Pandy. Since placement of the initial stent, there had been no significant healing of the pseudoaneurysm. Because of persistent headaches, she had a second stent for a symptomatic left internal carotid artery pseudoaneurysm that was performed on October 26, 2009.  Her last angiogram was in 2010 which she had a second stent placed. Additionally, due to concern of fibromuscular muscular dysplasia on her initial angiogram, she has been to the [facility] clinic and saw Dr. Gita Kudo who did not feel her imaging was strongly suggestive of FMD. She has chronic headaches since then and been evaluated at the pain clinic in 2013 who recommended "electrical shock pulses" but the  distance was too far for the patient to travel.   The severity of her headaches and dysarthria improved after initial stenting, but she continues to have constant sharp pain that starts behind her left ear and occasionally radiates into her neck and shoulder. Pain is sharp and pounding. She has tried percocet, vicodin, valium, verapamil, amitriptyline, gabapentin, cymbalta. She has not tried depakote, or nerve blocks, or botox. She takes one half tablet of Vicodin daily and additional as needed for pain but denies significant pain relief.    Medications:  Current Outpatient Prescriptions on File Prior to Visit  Medication Sig Dispense Refill  . aspirin 325 MG tablet Take 325 mg by mouth daily.      . diazepam (VALIUM) 5 MG tablet Take one tablet 30 minutes prior to MRI.  2 tablet  0  . HYDROcodone-acetaminophen (NORCO/VICODIN) 5-325 MG per tablet Take 1 tablet by mouth every 6 (six) hours as needed for pain.      [name] lisinopril (PRINIVIL,ZESTRIL) 5 MG tablet Take 1 tablet by mouth Daily.      . phenazopyridine (PYRIDIUM) 97 MG tablet Take 97 mg by mouth 3 (three) times daily as needed for pain.      [name] topiramate (TOPAMAX) 25 MG tablet Take one tablet daily x 1 week, then one tablet twice daily, then as directed  120 tablet  3   No current facility-administered medications on file prior to visit.    Allergies:  Allergies  Allergen Reactions  . Plavix [Clopidogrel Bisulfate]      Review of Systems:  CONSTITUTIONAL: No fevers, chills, night sweats, or weight loss.   EYES: No visual changes or eye pain ENT: No hearing changes.  No history of  nose bleeds.   RESPIRATORY: No cough, wheezing and shortness of breath.   CARDIOVASCULAR: Negative for chest pain, and palpitations.   GI: Negative for abdominal discomfort, blood in stools or black stools.  No recent change in bowel habits.   GU:  No history of incontinence.   MUSCLOSKELETAL: No history of joint pain or swelling.  No myalgias.    SKIN: Negative for lesions, rash, and itching.   HEMATOLOGY/ONCOLOGY: Negative for prolonged bleeding, bruising easily, and swollen nodes.  No history of cancer.   ENDOCRINE: Negative for cold or heat intolerance, polydipsia or goiter.   PSYCH:  No depression or anxiety symptoms.   NEURO: As Above.   Vital Signs:  BP 132/80  Pulse 70  Temp(Src) 97.7 F (36.5 C)  Resp 14  Ht 5\' 5"  (1.651 m)  Wt 213 lb 1.6 oz (96.662 kg)  BMI 35.46 kg/m2    Neurological Exam: MENTAL STATUS: Awake, oriented, and attentive.  Speech is not dysarthric.  CRANIAL NERVES:  Pupils are round and reactive. Extraocular muscles are intact. Face is symmetric. Tongue is midline.  MOTOR:  Motor strength is 5/5 in all extremities .  MSRs:  Right                                                                 Left brachioradialis 3+  brachioradialis 3+  biceps 3+  biceps 3+  triceps 3+  triceps 3+  patellar 3+  patellar 3+  ankle jerk 2+  ankle jerk 2+  Hoffman no  Hoffman yes  plantar response down  plantar response down  Crossed adductors bilaterally.  No ankle clonus.  SENSORY:  Intact to light touch  COORDINATION/GAIT:  Intact rapid alternating movements bilaterally.  Gait narrow based and stable. Stressed gait and tandem gait is intact   Data: CT/A head 08/11/2013: 1. The left ICA petrous segment stent is patent.  2. Normal variant CTA head without evidence for significant proximal  stenosis, aneurysm, or branch vessel occlusion.   CT/A neck 08/11/2013: 1. Tandem left ICA stents are patent without evidence for expansion of the fusiform left ICA aneurysm.  2. Slight irregularity in the high cervical right internal carotid artery suggesting fibromuscular dysplasia describes. This finding is new since the prior exam.  3. Focal tortuosity and looping of the vertebral arteries bilaterally at the C2 level.  IMPRESSION: Allison White is a 53 year-old female with known left ICA pseudoaneurysm at the  cervical petrous portion s/p stent placement (05/2008, 10/2009) who presents for follow-up of chronic daily headaches.    CT imaging (patient refused MRI) shows patent L ICA stents and stable fusiform aneurysm.  Although there is note of findings suggestive of FMD, which has been told to her in the past and she had evaluation at [facility] by Dr. Fonnie Jarvis, where they did not feel her imaging findings were supportive of this.  The management of FMD is with routine BP surveillance and ACE inhibitors and she is on lisinopril 5mg  daily. She apparently has not tolerated higher doses.    From a headache perspective, her imaging is stable and she has chronic daily headaches which needs to be medically optimized since she currently takes vicodin.  She has tried numerous medications in the past (percocet, vicodin, valium,  verapamil, amitriptyline, gabapentin, cymbalta, topamax). Options not tried include:  depakote, zonisamide, or nerve blocks, or botox.     PLAN/RECOMMENDATIONS:  1.  Start zonisamide 25mg  daily and titrate to 100mg  over 4 weeks. Risks and benefits discussed 2.  Patient to call with update in 4-weeks 3.  Return to clinic in 61-months  The duration of this appointment visit was 30 minutes of face-to-face time with the patient.  Greater than 50% of this time was spent in counseling, explanation of diagnosis, planning of further management, and coordination of care.   Thank you for allowing me to participate in patient's care.  If I can answer any additional questions, I would be pleased to do so.    Sincerely,    Donika K. Posey Pronto, DO

## 2014-01-05 ENCOUNTER — Telehealth: Payer: Self-pay | Admitting: Neurology

## 2014-01-05 NOTE — Telephone Encounter (Signed)
She states shell try to tolerate it for a few more weeks. She will call back to let you know how its going

## 2014-01-05 NOTE — Telephone Encounter (Signed)
Pt called and states that she believes that the  ZONISAMIDE 100mg  one tablet a day is making Her headaches worse.  Please call Pt.

## 2014-01-05 NOTE — Telephone Encounter (Signed)
Noted.  Donne Baley K. Sherel Fennell, DO   

## 2014-01-05 NOTE — Telephone Encounter (Signed)
She can taper zonisamide down to 50mg  daily.  Ideally, I'd like her on a good dose for 2-3 months before stopping, but if she is not tolerating, we can wean it down.  Laderrick Wilk K. Posey Pronto, DO

## 2014-01-05 NOTE — Telephone Encounter (Signed)
Patient states that she is up to the four pills daily for about two weeks and she does not feel any better. Actually the headaches are worse.

## 2014-01-10 ENCOUNTER — Other Ambulatory Visit: Payer: Self-pay | Admitting: Neurology

## 2014-01-10 MED ORDER — ZONISAMIDE 100 MG PO CAPS: 100.0000 mg | ORAL_CAPSULE | Freq: Every day | ORAL | Status: DC

## 2014-03-04 ENCOUNTER — Ambulatory Visit: Payer: BC Managed Care – PPO | Admitting: Neurology

## 2014-03-07 ENCOUNTER — Telehealth: Payer: Self-pay | Admitting: Neurology

## 2014-03-07 ENCOUNTER — Encounter: Payer: Self-pay | Admitting: Neurology

## 2014-03-07 NOTE — Telephone Encounter (Signed)
Pt no showed follow up appt w/ Dr. Posey Pronto 03/04/14. No show letter mailed to pt / Sherri S.

## 2014-05-12 ENCOUNTER — Other Ambulatory Visit: Payer: Self-pay | Admitting: Neurology

## 2014-05-12 MED ORDER — ZONISAMIDE 100 MG PO CAPS: 100.0000 mg | ORAL_CAPSULE | Freq: Every day | ORAL | Status: DC

## 2014-05-12 NOTE — Telephone Encounter (Signed)
Last visit in January.

## 2014-05-12 NOTE — Telephone Encounter (Signed)
Order sent.

## 2014-05-12 NOTE — Telephone Encounter (Signed)
Okay to refill for 30 days. Patient needs a followup visit prior to her next refill.  Donika K. Posey Pronto, DO

## 2014-07-14 ENCOUNTER — Other Ambulatory Visit: Payer: Self-pay | Admitting: Neurology

## 2014-07-14 NOTE — Telephone Encounter (Signed)
Order sent.

## 2014-08-16 ENCOUNTER — Ambulatory Visit (INDEPENDENT_AMBULATORY_CARE_PROVIDER_SITE_OTHER): Payer: BC Managed Care – PPO | Admitting: Neurology

## 2014-08-16 ENCOUNTER — Encounter: Payer: Self-pay | Admitting: Neurology

## 2014-08-16 VITALS — BP 120/84 | HR 90 | Ht 65.0 in | Wt 209.3 lb

## 2014-08-16 DIAGNOSIS — R51 Headache: Secondary | ICD-10-CM

## 2014-08-16 DIAGNOSIS — R519 Headache, unspecified: Secondary | ICD-10-CM

## 2014-08-16 DIAGNOSIS — I671 Cerebral aneurysm, nonruptured: Secondary | ICD-10-CM

## 2014-08-16 MED ORDER — TOPIRAMATE 25 MG PO TABS: ORAL_TABLET | ORAL | Status: DC

## 2014-08-16 NOTE — Progress Notes (Signed)
[place] [facility]  Follow-up Visit   Date: 08/16/2014   Allison White MRN: [id number] DOB: Nov 21, 1960   Interim History: Allison White is a 53 y.o. 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    History of present illness:  In 2009, she developed fairly abrupt onset of headaches and tongue weakness which prompted an evaluation by [facility] who ordered MRI/MRA which showed a large left internal carotid artery pseudoaneurysm with questionable old healed right internal carotid artery dissection. She was treated with aspirin and plavix and later underwent stent placement for the left internal carotid artery pseudoaneurysm performed on July 14, 2008, by Dr. Estanislado Pandy. Since placement of the initial stent, there had been no significant healing of the pseudoaneurysm. Because of persistent headaches, she had a second stent for a symptomatic left internal carotid artery pseudoaneurysm that was performed on October 26, 2009.   Her last angiogram was in 2010 which time she had a second stent placed. Additionally, due to concern of fibromuscular muscular dysplasia on her initial angiogram, she has been to the [facility] clinic and saw Dr. Gita Kudo who did not feel her imaging was strongly suggestive of FMD. She has chronic headaches since then and been evaluated at the pain clinic in 2013 who recommended "electrical shock pulses" but the distance was too far for the patient to travel.   The severity of her headaches and dysarthria improved after initial stenting, but she continues to have constant sharp pain that starts behind her left ear and occasionally radiates into her neck and shoulder. Pain is sharp and pounding. She has tried percocet, vicodin,  valium, verapamil, amitriptyline, gabapentin, cymbalta. She has not tried depakote, or nerve blocks, or botox. She takes one half tablet of Vicodin daily and additional as needed for pain but denies significant pain relief.   Jan 2015:  She tried topiramate which did wonders for headache but then she had developed abdominal pain and diarrhea so stopped it.  Headaches quickly returned after stopping topamax, but her vertigo has essentially resolved.  She has misses about 4-7 days of work per month her headaches.  UPDATE 08/16/2014:  At her last visit, I started zonisamide, but there was no improvement with her pain and she developed GI upset, she stopped it.  She continues to have problems with vertigo.  She is taking vicodin for her headaches, which she takes daily.  Headaches occur daily, lasting all day.  Pain is mostly at the base of the headache and described as throbbing.     Medications:  Current Outpatient Prescriptions on File Prior to Visit  Medication Sig Dispense Refill  . aspirin 325 MG tablet Take 325 mg by mouth daily.      [name] lisinopril (PRINIVIL,ZESTRIL) 5 MG tablet Take 1 tablet by mouth Daily.      . phenazopyridine (PYRIDIUM) 97 MG tablet Take 97 mg by mouth 3 (three) times daily as needed for pain.       No current facility-administered medications on file prior to visit.    Allergies:  Allergies  Allergen Reactions  . Plavix [Clopidogrel Bisulfate]      Review of Systems:  CONSTITUTIONAL: No fevers, chills, night sweats, or weight loss.   EYES: No visual changes or eye pain ENT: No hearing changes.  No history of nose bleeds.   RESPIRATORY: No cough, wheezing  and shortness of breath.   CARDIOVASCULAR: Negative for chest pain, and palpitations.   GI: Negative for abdominal discomfort, blood in stools or black stools.  No recent change in bowel habits.   GU:  No history of incontinence.   MUSCLOSKELETAL: No history of joint pain or swelling.  No myalgias.   SKIN:  Negative for lesions, rash, and itching.   HEMATOLOGY/ONCOLOGY: Negative for prolonged bleeding, bruising easily, and swollen nodes.  No history of cancer.   ENDOCRINE: Negative for cold or heat intolerance, polydipsia or goiter.   PSYCH:  No depression or anxiety symptoms.   NEURO: As Above.   Vital Signs:  BP 120/84  Pulse 90  Ht 5\' 5"  (1.651 m)  Wt 209 lb 5 oz (94.944 kg)  BMI 34.83 kg/m2  SpO2 98%   Neurological Exam: MENTAL STATUS: Awake, oriented, and attentive.  Speech is not dysarthric.  CRANIAL NERVES:  Pupils are round and reactive. Extraocular muscles are intact. Face is symmetric. Tongue is midline.  MOTOR:  Motor strength is 5/5 in all extremities.  MSRs:  Right                                                                 Left brachioradialis 3+  brachioradialis 3+  biceps 3+  biceps 3+  triceps 3+  triceps 3+  patellar 3+  patellar 3+  Crossed adductors bilaterally.    COORDINATION/GAIT: Mild right sided dysmetria with finger to nose testing. Gait narrow based and stable.   Data: CT/A head 08/11/2013: 1. The left ICA petrous segment stent is patent.  2. Normal variant CTA head without evidence for significant proximal  stenosis, aneurysm, or branch vessel occlusion.   CT/A neck 08/11/2013: 1. Tandem left ICA stents are patent without evidence for expansion of the fusiform left ICA aneurysm.  2. Slight irregularity in the high cervical right internal carotid artery suggesting fibromuscular dysplasia describes. This finding is new since the prior exam.  3. Focal tortuosity and looping of the vertebral arteries bilaterally at the C2 level.   IMPRESSION: 1. Chronic daily headaches, no improvement  -  Previously tried: percocet, vicodin, valium, verapamil, amitriptyline, gabapentin, cymbalta, topamax, zonisamide  -  Options not tried include:  nerve blocks, or botox.    -  Greatest benefit with TPM, but stopped due to GI side effect, patient requested to  restart it 2. Left ICA pseudoaneurysm at the cervical petrous portion s/p stent placement (05/2008, 10/2009).  - Vessel imaging 2014 shows patent L ICA stents and stable fusiform aneurysm.   -  Although there is note of findings suggestive of FMD, she had evaluation at [facility] by Dr. Fonnie Jarvis, where they did not feel her imaging findings were supportive of this.      PLAN/RECOMMENDATIONS:  1.  Stop zonisamide  2.  Start topamax 12.5mg  daily x 2 weeks, then increase to 25mg  daily.  If she is tolerating medication, it can be further titrated slowly. 3.  Consider Botox going forward 3.  Return to clinic in 37-months  The duration of this appointment visit was 25 minutes of face-to-face time with the patient.  Greater than 50% of this time was spent in counseling, explanation of diagnosis, planning of further management, and coordination of care.  Thank you for allowing me to participate in patient's care.  If I can answer any additional questions, I would be pleased to do so.    Sincerely,    Donika K. Posey Pronto, DO

## 2014-08-16 NOTE — Patient Instructions (Addendum)
1.  Start topamax 12.5mg  daily x 2 week, then increase to 1 tablet twice daily 2.  Call with update in 6 weeks to determine further titration of medication or to start Botox 3.  Return to clinic in 63-months

## 2014-09-19 ENCOUNTER — Telehealth: Payer: Self-pay | Admitting: Neurology

## 2014-09-19 NOTE — Telephone Encounter (Signed)
Pt called to report she never made it to the full Topamax dose. She has stopped the med. What is next option? CB# [phone] or home (after 3pm) [phone] / Sherri S.

## 2014-09-20 ENCOUNTER — Other Ambulatory Visit: Payer: Self-pay | Admitting: *Deleted

## 2014-09-20 MED ORDER — SERTRALINE HCL 25 MG PO TABS: 25.0000 mg | ORAL_TABLET | Freq: Every day | ORAL | Status: DC

## 2014-09-20 NOTE — Telephone Encounter (Signed)
Please advise 

## 2014-09-20 NOTE — Telephone Encounter (Signed)
We can try zoloft 25mg  daily or consider botox for her headaches (we discussed at last visit).  Limited options since she has already been on so many medications.  Tejon Gracie K. Posey Pronto, DO

## 2014-09-20 NOTE — Telephone Encounter (Signed)
Attempted to contact patient at work.  She is off today.  Will try home phone later.

## 2014-09-20 NOTE — Telephone Encounter (Signed)
Patient informed and is going to try the zoloft.  Rx sent in.

## 2014-11-15 ENCOUNTER — Ambulatory Visit: Payer: BC Managed Care – PPO | Admitting: Neurology

## 2014-11-21 ENCOUNTER — Telehealth: Payer: Self-pay | Admitting: *Deleted

## 2014-11-21 NOTE — Telephone Encounter (Signed)
Will she be having the Botox injection at her next visit?

## 2014-11-21 NOTE — Telephone Encounter (Signed)
Patient would like to speak with you about Botox Ball back number [phone]

## 2014-11-21 NOTE — Telephone Encounter (Signed)
Left message giving patient information and asked that she call me back to let me know if she is interested.

## 2014-11-21 NOTE — Telephone Encounter (Signed)
At the last visit, I asked her to think about it but she did not let us know. As of now, her next f/u visit is a clinic visit.  If she is interested in Botox for migraine, we will need to get prior authorization and schedule her for procedure visit.  Latrece Nitta K. Posey Pronto, DO

## 2014-11-23 NOTE — Telephone Encounter (Signed)
Can we change her follow up visit to a botox visit?

## 2014-12-05 ENCOUNTER — Ambulatory Visit: Payer: Self-pay | Admitting: Neurology

## 2015-01-31 ENCOUNTER — Encounter: Payer: Self-pay | Admitting: Neurology

## 2015-01-31 ENCOUNTER — Ambulatory Visit (INDEPENDENT_AMBULATORY_CARE_PROVIDER_SITE_OTHER): Payer: BLUE CROSS/BLUE SHIELD | Admitting: Neurology

## 2015-01-31 VITALS — HR 91 | Ht 65.0 in | Wt 215.2 lb

## 2015-01-31 DIAGNOSIS — R519 Headache, unspecified: Secondary | ICD-10-CM

## 2015-01-31 DIAGNOSIS — G43711 Chronic migraine without aura, intractable, with status migrainosus: Secondary | ICD-10-CM | POA: Insufficient documentation

## 2015-01-31 DIAGNOSIS — G43011 Migraine without aura, intractable, with status migrainosus: Secondary | ICD-10-CM

## 2015-01-31 DIAGNOSIS — R51 Headache: Secondary | ICD-10-CM

## 2015-01-31 DIAGNOSIS — I671 Cerebral aneurysm, nonruptured: Secondary | ICD-10-CM

## 2015-01-31 MED ORDER — ONABOTULINUMTOXINA 200 UNITS IJ SOLR
200.0000 [IU] | Freq: Once | INTRAMUSCULAR | Status: AC
  Administered 2015-01-31: 200 [IU] via INTRAMUSCULAR

## 2015-01-31 NOTE — Progress Notes (Signed)
[place] [facility]  Follow-up Visit   Date: 01/31/2015   Allison White MRN: [id number] DOB: [birth year]   Interim History: Allison White is a 54 y.o. 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  History of present illness:  In 2009, she developed fairly abrupt onset of headaches and tongue weakness which prompted an evaluation by [facility] who ordered MRI/MRA which showed a large left internal carotid artery pseudoaneurysm with questionable old healed right internal carotid artery dissection. She was treated with aspirin and plavix and later underwent stent placement for the left internal carotid artery pseudoaneurysm performed on July 14, 2008, by Dr. Estanislado Pandy. Since placement of the initial stent, there had been no significant healing of the pseudoaneurysm. Because of persistent headaches, she had a second stent for a symptomatic left internal carotid artery pseudoaneurysm that was performed on October 26, 2009.   Her last angiogram was in 2010 which time she had a second stent placed. Additionally, due to concern of fibromuscular muscular dysplasia on her initial angiogram, she has been to the [facility] clinic and saw Dr. Gita Kudo who did not feel her imaging was strongly suggestive of FMD. She has chronic headaches since then and been evaluated at the pain clinic in 2013 who recommended "electrical shock pulses" but the distance was too far for the patient to travel.   The severity of her headaches and dysarthria improved after initial stenting, but she continues to have constant sharp pain that starts behind her left ear and occasionally radiates into her neck and shoulder. Pain is sharp and pounding. She has tried percocet, vicodin, valium,  verapamil, amitriptyline, gabapentin, cymbalta. She has not tried depakote, or nerve blocks, or botox. She takes one half tablet of Vicodin daily and additional as needed for pain but denies significant pain relief.   Jan 2015:  She tried topiramate which did wonders for headache but then she had developed abdominal pain and diarrhea so stopped it.  Headaches quickly returned after stopping topamax, but her vertigo has essentially resolved.  She has misses about 4-7 days of work per month her headaches.  UPDATE 08/16/2014:  At her last visit, I started zonisamide, but there was no improvement with her pain and she developed GI upset, she stopped it.  She continues to have problems with vertigo.  She is taking vicodin for her headaches, which she takes daily.  Headaches occur daily, lasting all day.  Pain is mostly at the base of the headache and described as throbbing.   UPDATE 01/31/2015:  Patient did not tolerate topamax, so was switched to zoloft, but she stopped this as well.  Does not recall if she developed side effects to the zoloft. She is here fot her first Botox injection. She has a dull headache daily and severe headache about 50% of the month.  Headache lasts all day.     Medications:  Current Outpatient Prescriptions on File Prior to Visit  Medication Sig Dispense Refill  . aspirin 325 MG tablet Take 325 mg by mouth daily.    [name] HYDROcodone-acetaminophen (NORCO) 7.5-325 MG per tablet Take 1 tablet by mouth every 6 (six) hours as needed for moderate pain.    [name] lisinopril (PRINIVIL,ZESTRIL) 5 MG tablet Take 1 tablet by mouth Daily.    . Meth-Hyo-M Bl-Na Phos-Ph Sal (URIBEL) 118 MG CAPS Take by mouth 4 (four)  times daily.    . phenazopyridine (PYRIDIUM) 97 MG tablet Take 97 mg by mouth 3 (three) times daily as needed for pain.    [name] sertraline (ZOLOFT) 25 MG tablet Take 1 tablet (25 mg total) by mouth daily. 30 tablet 1  . topiramate (TOPAMAX) 25 MG tablet Take 12.5mg  (one-half tablet) daily  x 2 weeks, then increase to 1 tablet daily. 30 tablet 3   No current facility-administered medications on file prior to visit.    Allergies:  Allergies  Allergen Reactions  . Plavix [Clopidogrel Bisulfate]      Review of Systems:  CONSTITUTIONAL: No fevers, chills, night sweats, or weight loss.   EYES: No visual changes or eye pain ENT: No hearing changes.  No history of nose bleeds.   RESPIRATORY: No cough, wheezing and shortness of breath.   CARDIOVASCULAR: Negative for chest pain, and palpitations.   GI: Negative for abdominal discomfort, blood in stools or black stools.  No recent change in bowel habits.   GU:  No history of incontinence.   MUSCLOSKELETAL: No history of joint pain or swelling.  No myalgias.   SKIN: Negative for lesions, rash, and itching.   HEMATOLOGY/ONCOLOGY: Negative for prolonged bleeding, bruising easily, and swollen nodes.  No history of cancer.   ENDOCRINE: Negative for cold or heat intolerance, polydipsia or goiter.   PSYCH:  No depression or anxiety symptoms.   NEURO: As Above.   Vital Signs:  Pulse 91  Ht 5\' 5"  (1.651 m)  Wt 215 lb 4 oz (97.637 kg)  BMI 35.82 kg/m2  SpO2 95%   Neurological Exam: MENTAL STATUS: Awake, oriented, and attentive.  Speech is not dysarthric.  CRANIAL NERVES:  Pupils are round and reactive. Extraocular muscles are intact. Face is symmetric. Tongue is midline.  MOTOR:  Motor strength is 5/5 in all extremities.  COORDINATION/GAIT:  Gait narrow based and stable.   Data: CT/A head 08/11/2013: 1. The left ICA petrous segment stent is patent.  2. Normal variant CTA head without evidence for significant proximal  stenosis, aneurysm, or branch vessel occlusion.   CT/A neck 08/11/2013: 1. Tandem left ICA stents are patent without evidence for expansion of the fusiform left ICA aneurysm.  2. Slight irregularity in the high cervical right internal carotid artery suggesting fibromuscular dysplasia describes. This finding  is new since the prior exam.  3. Focal tortuosity and looping of the vertebral arteries bilaterally at the C2 level.   IMPRESSION: 1. Intractable migraine without aura  - Botox injection today (see procedure note).  Risks and benefits discussed.  - Headaches occuring daily (30 days), lasting all day (24 hr)  2. Chronic daily headaches, no improvement  -  Previously tried: percocet, vicodin, valium, verapamil, amitriptyline, gabapentin, cymbalta, topamax, zonisamide, zoloft  -  Options not tried include:  nerve blocks  -  Greatest benefit with TPM, but stopped due to GI side effect  3. Left ICA pseudoaneurysm at the cervical petrous portion s/p stent placement (05/2008, 10/2009).  - Vessel imaging 2014 shows patent L ICA stents and stable fusiform aneurysm.   -  Although there is note of findings suggestive of FMD, she had evaluation at [facility] by Dr. Fonnie Jarvis, where they did not feel her imaging findings were supportive of this.    4.  Return to clinic in 6 weeks  The duration of this appointment visit was 40 minutes of face-to-face time with the patient.  Greater than 50% of this time was spent in counseling, explanation  of diagnosis, planning of further management, and coordination of care.   Thank you for allowing me to participate in patient's care.  If I can answer any additional questions, I would be pleased to do so.    Sincerely,    Bunnie Lederman K. Posey Pronto, DO

## 2015-01-31 NOTE — Procedures (Signed)
Botulinum Clinic   Procedure Note Botox  Attending: Dr. Narda Amber  Preoperative Diagnosis(es): Intractable migraine   Consent obtained from: The patient Benefits discussed included, but were not limited to decreased muscle tightness, increased joint range of motion, and decreased pain.  Risk discussed included, but were not limited pain and discomfort, bleeding, bruising, excessive weakness, venous thrombosis, muscle atrophy and dysphagia.  Anticipated outcomes of the procedure as well as he risks and benefits of the alternatives to the procedure, and the roles and tasks of the personnel to be involved, were discussed with the patient, and the patient consents to the procedure and agrees to proceed. A copy of the patient medication guide was given to the patient which explains the blackbox warning.  Patients identity and treatment sites confirmed Yes.  .  Details of Procedure: Skin was cleaned with alcohol. Prior to injection, the needle plunger was aspirated to make sure the needle was not within a blood vessel.  There was no blood retrieved on aspiration.    Following is a summary of the muscles injected  And the amount of Botulinum toxin used:   Dilution 200 units of Botox was reconstituted with 4 ml of preservative free normal saline. Time of reconstitution: At the time of the office visit (<30 minutes prior to injection)   Injections   155 total units of Botox was injected with a 30 gauge needle.  Injection Sites: L occipitalis: 15 units- 3 sites  R occiptalis: 15 units- 3 sites  L upper trapezius: 15 units- 3 sites R upper trapezius: 15 units- 3 sits          L paraspinal (___mid__level): 10 units- 2 sites R paraspinal (__mid___level): 10 units- 2 sites   Face L frontalis(2 injection sites):10 units   R frontalis(2 injection sites):10 units         L corrugator: 5 units   R corrugator: 5 units           Procerus: 5 units   L temporalis: 20 units R temporalis: 20  units   Agent:  200 units of botulinum Type A (Onobotulinum Toxin type A) was reconstituted with 4 ml of preservative free normal saline.  Time of reconstitution: At the time of the office visit (<30 minutes prior to injection)     Total injected (Units): 155  Total wasted (Units): 45   Patient tolerated procedure well without complications.   Reinjection is anticipated in 3 months. Return to clinic in 6-weeks.

## 2015-04-04 ENCOUNTER — Ambulatory Visit: Payer: BLUE CROSS/BLUE SHIELD | Admitting: Neurology

## 2016-05-09 DIAGNOSIS — G43909 Migraine, unspecified, not intractable, without status migrainosus: Secondary | ICD-10-CM | POA: Diagnosis not present

## 2016-05-09 DIAGNOSIS — E785 Hyperlipidemia, unspecified: Secondary | ICD-10-CM | POA: Diagnosis not present

## 2016-05-09 DIAGNOSIS — I10 Essential (primary) hypertension: Secondary | ICD-10-CM | POA: Diagnosis not present

## 2016-05-09 DIAGNOSIS — I773 Arterial fibromuscular dysplasia: Secondary | ICD-10-CM | POA: Diagnosis not present

## 2016-05-09 DIAGNOSIS — G8929 Other chronic pain: Secondary | ICD-10-CM | POA: Diagnosis not present

## 2016-05-31 ENCOUNTER — Ambulatory Visit: Payer: BLUE CROSS/BLUE SHIELD | Admitting: Neurology

## 2016-08-05 DIAGNOSIS — I10 Essential (primary) hypertension: Secondary | ICD-10-CM | POA: Diagnosis not present

## 2016-08-05 DIAGNOSIS — E785 Hyperlipidemia, unspecified: Secondary | ICD-10-CM | POA: Diagnosis not present

## 2016-08-05 DIAGNOSIS — I773 Arterial fibromuscular dysplasia: Secondary | ICD-10-CM | POA: Diagnosis not present

## 2016-10-31 DIAGNOSIS — I10 Essential (primary) hypertension: Secondary | ICD-10-CM | POA: Diagnosis not present

## 2016-10-31 DIAGNOSIS — E782 Mixed hyperlipidemia: Secondary | ICD-10-CM | POA: Diagnosis not present

## 2016-10-31 DIAGNOSIS — I773 Arterial fibromuscular dysplasia: Secondary | ICD-10-CM | POA: Diagnosis not present

## 2016-10-31 DIAGNOSIS — G43109 Migraine with aura, not intractable, without status migrainosus: Secondary | ICD-10-CM | POA: Diagnosis not present

## 2016-11-19 DIAGNOSIS — I2510 Atherosclerotic heart disease of native coronary artery without angina pectoris: Secondary | ICD-10-CM | POA: Diagnosis not present

## 2016-11-19 DIAGNOSIS — I2109 ST elevation (STEMI) myocardial infarction involving other coronary artery of anterior wall: Secondary | ICD-10-CM | POA: Diagnosis not present

## 2016-11-19 DIAGNOSIS — I219 Acute myocardial infarction, unspecified: Secondary | ICD-10-CM

## 2016-11-19 DIAGNOSIS — E6609 Other obesity due to excess calories: Secondary | ICD-10-CM | POA: Diagnosis not present

## 2016-11-19 DIAGNOSIS — I25118 Atherosclerotic heart disease of native coronary artery with other forms of angina pectoris: Secondary | ICD-10-CM | POA: Diagnosis not present

## 2016-11-19 DIAGNOSIS — I4519 Other right bundle-branch block: Secondary | ICD-10-CM | POA: Diagnosis not present

## 2016-11-19 DIAGNOSIS — I959 Hypotension, unspecified: Secondary | ICD-10-CM | POA: Diagnosis not present

## 2016-11-19 DIAGNOSIS — R079 Chest pain, unspecified: Secondary | ICD-10-CM | POA: Diagnosis not present

## 2016-11-19 DIAGNOSIS — D72829 Elevated white blood cell count, unspecified: Secondary | ICD-10-CM | POA: Diagnosis not present

## 2016-11-19 DIAGNOSIS — I5021 Acute systolic (congestive) heart failure: Secondary | ICD-10-CM | POA: Diagnosis not present

## 2016-11-19 DIAGNOSIS — R748 Abnormal levels of other serum enzymes: Secondary | ICD-10-CM | POA: Diagnosis not present

## 2016-11-19 DIAGNOSIS — R9431 Abnormal electrocardiogram [ECG] [EKG]: Secondary | ICD-10-CM | POA: Diagnosis not present

## 2016-11-19 DIAGNOSIS — I7774 Dissection of vertebral artery: Secondary | ICD-10-CM | POA: Diagnosis not present

## 2016-11-19 DIAGNOSIS — E876 Hypokalemia: Secondary | ICD-10-CM | POA: Diagnosis not present

## 2016-11-19 DIAGNOSIS — Z6837 Body mass index (BMI) 37.0-37.9, adult: Secondary | ICD-10-CM | POA: Diagnosis not present

## 2016-11-19 DIAGNOSIS — R51 Headache: Secondary | ICD-10-CM | POA: Diagnosis not present

## 2016-11-19 DIAGNOSIS — I252 Old myocardial infarction: Secondary | ICD-10-CM | POA: Diagnosis not present

## 2016-11-19 DIAGNOSIS — I2119 ST elevation (STEMI) myocardial infarction involving other coronary artery of inferior wall: Secondary | ICD-10-CM | POA: Diagnosis not present

## 2016-11-19 DIAGNOSIS — R0789 Other chest pain: Secondary | ICD-10-CM | POA: Diagnosis not present

## 2016-11-19 DIAGNOSIS — I5022 Chronic systolic (congestive) heart failure: Secondary | ICD-10-CM | POA: Diagnosis not present

## 2016-11-19 DIAGNOSIS — I25119 Atherosclerotic heart disease of native coronary artery with unspecified angina pectoris: Secondary | ICD-10-CM | POA: Diagnosis not present

## 2016-11-19 DIAGNOSIS — J811 Chronic pulmonary edema: Secondary | ICD-10-CM | POA: Diagnosis not present

## 2016-11-19 DIAGNOSIS — J9600 Acute respiratory failure, unspecified whether with hypoxia or hypercapnia: Secondary | ICD-10-CM | POA: Diagnosis not present

## 2016-11-19 DIAGNOSIS — I2102 ST elevation (STEMI) myocardial infarction involving left anterior descending coronary artery: Secondary | ICD-10-CM | POA: Diagnosis not present

## 2016-11-19 DIAGNOSIS — R000 Tachycardia, unspecified: Secondary | ICD-10-CM | POA: Diagnosis not present

## 2016-11-19 DIAGNOSIS — R570 Cardiogenic shock: Secondary | ICD-10-CM | POA: Diagnosis not present

## 2016-11-19 DIAGNOSIS — Z6836 Body mass index (BMI) 36.0-36.9, adult: Secondary | ICD-10-CM | POA: Diagnosis not present

## 2016-11-19 DIAGNOSIS — I444 Left anterior fascicular block: Secondary | ICD-10-CM | POA: Diagnosis not present

## 2016-11-19 DIAGNOSIS — I213 ST elevation (STEMI) myocardial infarction of unspecified site: Secondary | ICD-10-CM | POA: Diagnosis not present

## 2016-11-19 DIAGNOSIS — Z8679 Personal history of other diseases of the circulatory system: Secondary | ICD-10-CM | POA: Diagnosis not present

## 2016-11-19 DIAGNOSIS — I110 Hypertensive heart disease with heart failure: Secondary | ICD-10-CM | POA: Diagnosis not present

## 2016-11-19 HISTORY — DX: Acute myocardial infarction, unspecified: I21.9

## 2016-11-20 DIAGNOSIS — I110 Hypertensive heart disease with heart failure: Secondary | ICD-10-CM | POA: Diagnosis not present

## 2016-11-20 DIAGNOSIS — I5021 Acute systolic (congestive) heart failure: Secondary | ICD-10-CM | POA: Diagnosis not present

## 2016-11-20 DIAGNOSIS — R000 Tachycardia, unspecified: Secondary | ICD-10-CM | POA: Diagnosis not present

## 2016-11-20 DIAGNOSIS — I2102 ST elevation (STEMI) myocardial infarction involving left anterior descending coronary artery: Secondary | ICD-10-CM | POA: Diagnosis not present

## 2016-11-20 DIAGNOSIS — I444 Left anterior fascicular block: Secondary | ICD-10-CM | POA: Diagnosis not present

## 2016-11-20 DIAGNOSIS — I2109 ST elevation (STEMI) myocardial infarction involving other coronary artery of anterior wall: Secondary | ICD-10-CM | POA: Diagnosis not present

## 2016-11-20 DIAGNOSIS — R079 Chest pain, unspecified: Secondary | ICD-10-CM | POA: Diagnosis not present

## 2016-11-20 DIAGNOSIS — I25118 Atherosclerotic heart disease of native coronary artery with other forms of angina pectoris: Secondary | ICD-10-CM | POA: Diagnosis not present

## 2016-11-21 DIAGNOSIS — I4519 Other right bundle-branch block: Secondary | ICD-10-CM | POA: Diagnosis not present

## 2016-11-21 DIAGNOSIS — R000 Tachycardia, unspecified: Secondary | ICD-10-CM | POA: Diagnosis not present

## 2016-11-21 DIAGNOSIS — I2510 Atherosclerotic heart disease of native coronary artery without angina pectoris: Secondary | ICD-10-CM | POA: Diagnosis not present

## 2016-11-21 DIAGNOSIS — I5021 Acute systolic (congestive) heart failure: Secondary | ICD-10-CM | POA: Diagnosis not present

## 2016-11-21 DIAGNOSIS — D72829 Elevated white blood cell count, unspecified: Secondary | ICD-10-CM | POA: Diagnosis not present

## 2016-11-21 DIAGNOSIS — I2102 ST elevation (STEMI) myocardial infarction involving left anterior descending coronary artery: Secondary | ICD-10-CM | POA: Diagnosis not present

## 2016-11-22 DIAGNOSIS — R000 Tachycardia, unspecified: Secondary | ICD-10-CM | POA: Diagnosis not present

## 2016-11-22 DIAGNOSIS — I5021 Acute systolic (congestive) heart failure: Secondary | ICD-10-CM | POA: Diagnosis not present

## 2016-11-22 DIAGNOSIS — I2510 Atherosclerotic heart disease of native coronary artery without angina pectoris: Secondary | ICD-10-CM | POA: Diagnosis not present

## 2016-11-22 DIAGNOSIS — I2109 ST elevation (STEMI) myocardial infarction involving other coronary artery of anterior wall: Secondary | ICD-10-CM | POA: Diagnosis not present

## 2016-11-22 DIAGNOSIS — I252 Old myocardial infarction: Secondary | ICD-10-CM | POA: Diagnosis not present

## 2016-11-22 DIAGNOSIS — I2102 ST elevation (STEMI) myocardial infarction involving left anterior descending coronary artery: Secondary | ICD-10-CM | POA: Diagnosis not present

## 2016-11-22 DIAGNOSIS — R9431 Abnormal electrocardiogram [ECG] [EKG]: Secondary | ICD-10-CM | POA: Diagnosis not present

## 2016-11-22 DIAGNOSIS — Z6836 Body mass index (BMI) 36.0-36.9, adult: Secondary | ICD-10-CM | POA: Diagnosis not present

## 2016-11-23 DIAGNOSIS — R000 Tachycardia, unspecified: Secondary | ICD-10-CM | POA: Diagnosis not present

## 2016-11-23 DIAGNOSIS — I2109 ST elevation (STEMI) myocardial infarction involving other coronary artery of anterior wall: Secondary | ICD-10-CM | POA: Diagnosis not present

## 2016-11-23 DIAGNOSIS — I2119 ST elevation (STEMI) myocardial infarction involving other coronary artery of inferior wall: Secondary | ICD-10-CM | POA: Diagnosis not present

## 2016-11-23 DIAGNOSIS — I25118 Atherosclerotic heart disease of native coronary artery with other forms of angina pectoris: Secondary | ICD-10-CM | POA: Diagnosis not present

## 2016-11-23 DIAGNOSIS — I5022 Chronic systolic (congestive) heart failure: Secondary | ICD-10-CM | POA: Diagnosis not present

## 2016-11-23 DIAGNOSIS — I5021 Acute systolic (congestive) heart failure: Secondary | ICD-10-CM | POA: Diagnosis not present

## 2016-11-23 DIAGNOSIS — I2102 ST elevation (STEMI) myocardial infarction involving left anterior descending coronary artery: Secondary | ICD-10-CM | POA: Diagnosis not present

## 2016-11-23 DIAGNOSIS — D72829 Elevated white blood cell count, unspecified: Secondary | ICD-10-CM | POA: Diagnosis not present

## 2016-11-24 DIAGNOSIS — I25118 Atherosclerotic heart disease of native coronary artery with other forms of angina pectoris: Secondary | ICD-10-CM | POA: Diagnosis not present

## 2016-11-24 DIAGNOSIS — R000 Tachycardia, unspecified: Secondary | ICD-10-CM | POA: Diagnosis not present

## 2016-11-24 DIAGNOSIS — I2102 ST elevation (STEMI) myocardial infarction involving left anterior descending coronary artery: Secondary | ICD-10-CM | POA: Diagnosis not present

## 2016-11-25 DIAGNOSIS — I2102 ST elevation (STEMI) myocardial infarction involving left anterior descending coronary artery: Secondary | ICD-10-CM | POA: Diagnosis not present

## 2016-11-25 DIAGNOSIS — I5021 Acute systolic (congestive) heart failure: Secondary | ICD-10-CM | POA: Diagnosis not present

## 2016-11-25 DIAGNOSIS — I252 Old myocardial infarction: Secondary | ICD-10-CM | POA: Diagnosis not present

## 2016-11-25 DIAGNOSIS — I25118 Atherosclerotic heart disease of native coronary artery with other forms of angina pectoris: Secondary | ICD-10-CM | POA: Diagnosis not present

## 2016-11-25 DIAGNOSIS — R000 Tachycardia, unspecified: Secondary | ICD-10-CM | POA: Diagnosis not present

## 2016-11-25 DIAGNOSIS — R9431 Abnormal electrocardiogram [ECG] [EKG]: Secondary | ICD-10-CM | POA: Diagnosis not present

## 2016-12-01 DIAGNOSIS — I959 Hypotension, unspecified: Secondary | ICD-10-CM | POA: Diagnosis not present

## 2016-12-01 DIAGNOSIS — R0609 Other forms of dyspnea: Secondary | ICD-10-CM | POA: Diagnosis not present

## 2016-12-01 DIAGNOSIS — R05 Cough: Secondary | ICD-10-CM | POA: Diagnosis not present

## 2016-12-01 DIAGNOSIS — J40 Bronchitis, not specified as acute or chronic: Secondary | ICD-10-CM | POA: Diagnosis not present

## 2016-12-01 DIAGNOSIS — I2109 ST elevation (STEMI) myocardial infarction involving other coronary artery of anterior wall: Secondary | ICD-10-CM | POA: Diagnosis not present

## 2016-12-01 DIAGNOSIS — I5021 Acute systolic (congestive) heart failure: Secondary | ICD-10-CM | POA: Diagnosis not present

## 2016-12-01 DIAGNOSIS — I25119 Atherosclerotic heart disease of native coronary artery with unspecified angina pectoris: Secondary | ICD-10-CM | POA: Diagnosis not present

## 2016-12-01 DIAGNOSIS — R0602 Shortness of breath: Secondary | ICD-10-CM | POA: Diagnosis not present

## 2016-12-01 DIAGNOSIS — J90 Pleural effusion, not elsewhere classified: Secondary | ICD-10-CM | POA: Diagnosis not present

## 2016-12-01 DIAGNOSIS — E876 Hypokalemia: Secondary | ICD-10-CM | POA: Diagnosis not present

## 2016-12-01 DIAGNOSIS — J9601 Acute respiratory failure with hypoxia: Secondary | ICD-10-CM | POA: Diagnosis not present

## 2016-12-01 DIAGNOSIS — E8770 Fluid overload, unspecified: Secondary | ICD-10-CM | POA: Diagnosis not present

## 2016-12-01 DIAGNOSIS — E669 Obesity, unspecified: Secondary | ICD-10-CM | POA: Diagnosis not present

## 2016-12-01 DIAGNOSIS — Z6836 Body mass index (BMI) 36.0-36.9, adult: Secondary | ICD-10-CM | POA: Diagnosis not present

## 2016-12-01 DIAGNOSIS — I2102 ST elevation (STEMI) myocardial infarction involving left anterior descending coronary artery: Secondary | ICD-10-CM | POA: Diagnosis not present

## 2016-12-01 DIAGNOSIS — Z7982 Long term (current) use of aspirin: Secondary | ICD-10-CM | POA: Diagnosis not present

## 2016-12-01 DIAGNOSIS — I509 Heart failure, unspecified: Secondary | ICD-10-CM | POA: Diagnosis not present

## 2016-12-01 DIAGNOSIS — I110 Hypertensive heart disease with heart failure: Secondary | ICD-10-CM | POA: Diagnosis not present

## 2016-12-01 DIAGNOSIS — Z87891 Personal history of nicotine dependence: Secondary | ICD-10-CM | POA: Diagnosis not present

## 2016-12-01 DIAGNOSIS — I25118 Atherosclerotic heart disease of native coronary artery with other forms of angina pectoris: Secondary | ICD-10-CM | POA: Diagnosis not present

## 2016-12-01 DIAGNOSIS — I5022 Chronic systolic (congestive) heart failure: Secondary | ICD-10-CM | POA: Diagnosis not present

## 2016-12-01 DIAGNOSIS — R091 Pleurisy: Secondary | ICD-10-CM | POA: Diagnosis not present

## 2016-12-01 DIAGNOSIS — I255 Ischemic cardiomyopathy: Secondary | ICD-10-CM | POA: Diagnosis not present

## 2016-12-02 DIAGNOSIS — E8770 Fluid overload, unspecified: Secondary | ICD-10-CM | POA: Diagnosis not present

## 2016-12-02 DIAGNOSIS — R091 Pleurisy: Secondary | ICD-10-CM | POA: Diagnosis not present

## 2016-12-02 DIAGNOSIS — I2109 ST elevation (STEMI) myocardial infarction involving other coronary artery of anterior wall: Secondary | ICD-10-CM | POA: Diagnosis not present

## 2016-12-02 DIAGNOSIS — I2102 ST elevation (STEMI) myocardial infarction involving left anterior descending coronary artery: Secondary | ICD-10-CM | POA: Diagnosis not present

## 2016-12-02 DIAGNOSIS — I110 Hypertensive heart disease with heart failure: Secondary | ICD-10-CM | POA: Diagnosis not present

## 2016-12-02 DIAGNOSIS — I25118 Atherosclerotic heart disease of native coronary artery with other forms of angina pectoris: Secondary | ICD-10-CM | POA: Diagnosis not present

## 2016-12-02 DIAGNOSIS — I509 Heart failure, unspecified: Secondary | ICD-10-CM | POA: Diagnosis not present

## 2016-12-02 DIAGNOSIS — J90 Pleural effusion, not elsewhere classified: Secondary | ICD-10-CM | POA: Diagnosis not present

## 2016-12-02 DIAGNOSIS — R0609 Other forms of dyspnea: Secondary | ICD-10-CM | POA: Diagnosis not present

## 2016-12-02 DIAGNOSIS — J9601 Acute respiratory failure with hypoxia: Secondary | ICD-10-CM | POA: Diagnosis not present

## 2016-12-02 DIAGNOSIS — I25119 Atherosclerotic heart disease of native coronary artery with unspecified angina pectoris: Secondary | ICD-10-CM | POA: Diagnosis not present

## 2016-12-02 DIAGNOSIS — I5021 Acute systolic (congestive) heart failure: Secondary | ICD-10-CM | POA: Diagnosis not present

## 2016-12-02 DIAGNOSIS — R0602 Shortness of breath: Secondary | ICD-10-CM | POA: Diagnosis not present

## 2016-12-03 DIAGNOSIS — I959 Hypotension, unspecified: Secondary | ICD-10-CM | POA: Diagnosis not present

## 2016-12-03 DIAGNOSIS — J90 Pleural effusion, not elsewhere classified: Secondary | ICD-10-CM | POA: Diagnosis not present

## 2016-12-03 DIAGNOSIS — I2102 ST elevation (STEMI) myocardial infarction involving left anterior descending coronary artery: Secondary | ICD-10-CM | POA: Diagnosis not present

## 2016-12-03 DIAGNOSIS — I255 Ischemic cardiomyopathy: Secondary | ICD-10-CM | POA: Diagnosis not present

## 2016-12-03 DIAGNOSIS — I110 Hypertensive heart disease with heart failure: Secondary | ICD-10-CM | POA: Diagnosis not present

## 2016-12-03 DIAGNOSIS — Z6836 Body mass index (BMI) 36.0-36.9, adult: Secondary | ICD-10-CM | POA: Diagnosis not present

## 2016-12-03 DIAGNOSIS — E876 Hypokalemia: Secondary | ICD-10-CM | POA: Diagnosis not present

## 2016-12-04 DIAGNOSIS — R0602 Shortness of breath: Secondary | ICD-10-CM | POA: Diagnosis not present

## 2016-12-04 DIAGNOSIS — I25118 Atherosclerotic heart disease of native coronary artery with other forms of angina pectoris: Secondary | ICD-10-CM | POA: Diagnosis not present

## 2016-12-04 DIAGNOSIS — I110 Hypertensive heart disease with heart failure: Secondary | ICD-10-CM | POA: Diagnosis not present

## 2016-12-04 DIAGNOSIS — Z6836 Body mass index (BMI) 36.0-36.9, adult: Secondary | ICD-10-CM | POA: Diagnosis not present

## 2016-12-04 DIAGNOSIS — I959 Hypotension, unspecified: Secondary | ICD-10-CM | POA: Diagnosis not present

## 2016-12-04 DIAGNOSIS — I509 Heart failure, unspecified: Secondary | ICD-10-CM | POA: Diagnosis not present

## 2016-12-04 DIAGNOSIS — I2102 ST elevation (STEMI) myocardial infarction involving left anterior descending coronary artery: Secondary | ICD-10-CM | POA: Diagnosis not present

## 2016-12-04 DIAGNOSIS — J90 Pleural effusion, not elsewhere classified: Secondary | ICD-10-CM | POA: Diagnosis not present

## 2016-12-05 DIAGNOSIS — I5021 Acute systolic (congestive) heart failure: Secondary | ICD-10-CM | POA: Diagnosis not present

## 2016-12-05 DIAGNOSIS — I25118 Atherosclerotic heart disease of native coronary artery with other forms of angina pectoris: Secondary | ICD-10-CM | POA: Diagnosis not present

## 2016-12-05 DIAGNOSIS — I2102 ST elevation (STEMI) myocardial infarction involving left anterior descending coronary artery: Secondary | ICD-10-CM | POA: Diagnosis not present

## 2016-12-05 DIAGNOSIS — I2109 ST elevation (STEMI) myocardial infarction involving other coronary artery of anterior wall: Secondary | ICD-10-CM | POA: Diagnosis not present

## 2016-12-05 DIAGNOSIS — I110 Hypertensive heart disease with heart failure: Secondary | ICD-10-CM | POA: Diagnosis not present

## 2016-12-05 DIAGNOSIS — E785 Hyperlipidemia, unspecified: Secondary | ICD-10-CM | POA: Insufficient documentation

## 2016-12-05 DIAGNOSIS — I509 Heart failure, unspecified: Secondary | ICD-10-CM | POA: Insufficient documentation

## 2016-12-05 DIAGNOSIS — J90 Pleural effusion, not elsewhere classified: Secondary | ICD-10-CM | POA: Diagnosis not present

## 2016-12-05 DIAGNOSIS — I5022 Chronic systolic (congestive) heart failure: Secondary | ICD-10-CM | POA: Insufficient documentation

## 2016-12-05 DIAGNOSIS — Z6836 Body mass index (BMI) 36.0-36.9, adult: Secondary | ICD-10-CM | POA: Diagnosis not present

## 2016-12-06 DIAGNOSIS — I25119 Atherosclerotic heart disease of native coronary artery with unspecified angina pectoris: Secondary | ICD-10-CM | POA: Diagnosis not present

## 2016-12-06 DIAGNOSIS — I110 Hypertensive heart disease with heart failure: Secondary | ICD-10-CM | POA: Diagnosis not present

## 2016-12-06 DIAGNOSIS — I773 Arterial fibromuscular dysplasia: Secondary | ICD-10-CM | POA: Insufficient documentation

## 2016-12-06 DIAGNOSIS — I2102 ST elevation (STEMI) myocardial infarction involving left anterior descending coronary artery: Secondary | ICD-10-CM | POA: Diagnosis not present

## 2016-12-06 DIAGNOSIS — I5022 Chronic systolic (congestive) heart failure: Secondary | ICD-10-CM | POA: Diagnosis not present

## 2016-12-12 DIAGNOSIS — I25119 Atherosclerotic heart disease of native coronary artery with unspecified angina pectoris: Secondary | ICD-10-CM | POA: Diagnosis not present

## 2016-12-12 DIAGNOSIS — Z6836 Body mass index (BMI) 36.0-36.9, adult: Secondary | ICD-10-CM | POA: Diagnosis not present

## 2016-12-12 DIAGNOSIS — I110 Hypertensive heart disease with heart failure: Secondary | ICD-10-CM | POA: Diagnosis not present

## 2016-12-12 DIAGNOSIS — I5022 Chronic systolic (congestive) heart failure: Secondary | ICD-10-CM | POA: Diagnosis not present

## 2016-12-24 DIAGNOSIS — I25119 Atherosclerotic heart disease of native coronary artery with unspecified angina pectoris: Secondary | ICD-10-CM | POA: Diagnosis not present

## 2016-12-24 DIAGNOSIS — Z79899 Other long term (current) drug therapy: Secondary | ICD-10-CM | POA: Diagnosis not present

## 2016-12-24 DIAGNOSIS — I5022 Chronic systolic (congestive) heart failure: Secondary | ICD-10-CM | POA: Diagnosis not present

## 2016-12-24 DIAGNOSIS — I110 Hypertensive heart disease with heart failure: Secondary | ICD-10-CM | POA: Diagnosis not present

## 2016-12-24 DIAGNOSIS — Z792 Long term (current) use of antibiotics: Secondary | ICD-10-CM | POA: Diagnosis not present

## 2016-12-24 DIAGNOSIS — I252 Old myocardial infarction: Secondary | ICD-10-CM | POA: Diagnosis not present

## 2016-12-24 DIAGNOSIS — E785 Hyperlipidemia, unspecified: Secondary | ICD-10-CM | POA: Diagnosis not present

## 2016-12-25 DIAGNOSIS — Z792 Long term (current) use of antibiotics: Secondary | ICD-10-CM | POA: Diagnosis not present

## 2016-12-25 DIAGNOSIS — Z79899 Other long term (current) drug therapy: Secondary | ICD-10-CM | POA: Diagnosis not present

## 2016-12-25 DIAGNOSIS — I5022 Chronic systolic (congestive) heart failure: Secondary | ICD-10-CM | POA: Diagnosis not present

## 2016-12-25 DIAGNOSIS — I252 Old myocardial infarction: Secondary | ICD-10-CM | POA: Diagnosis not present

## 2016-12-25 DIAGNOSIS — I110 Hypertensive heart disease with heart failure: Secondary | ICD-10-CM | POA: Diagnosis not present

## 2016-12-27 DIAGNOSIS — I5022 Chronic systolic (congestive) heart failure: Secondary | ICD-10-CM | POA: Diagnosis not present

## 2016-12-27 DIAGNOSIS — Z79899 Other long term (current) drug therapy: Secondary | ICD-10-CM | POA: Diagnosis not present

## 2016-12-27 DIAGNOSIS — Z792 Long term (current) use of antibiotics: Secondary | ICD-10-CM | POA: Diagnosis not present

## 2016-12-27 DIAGNOSIS — I252 Old myocardial infarction: Secondary | ICD-10-CM | POA: Diagnosis not present

## 2016-12-27 DIAGNOSIS — I110 Hypertensive heart disease with heart failure: Secondary | ICD-10-CM | POA: Diagnosis not present

## 2016-12-30 DIAGNOSIS — I252 Old myocardial infarction: Secondary | ICD-10-CM | POA: Diagnosis not present

## 2016-12-30 DIAGNOSIS — I110 Hypertensive heart disease with heart failure: Secondary | ICD-10-CM | POA: Diagnosis not present

## 2016-12-30 DIAGNOSIS — I5022 Chronic systolic (congestive) heart failure: Secondary | ICD-10-CM | POA: Diagnosis not present

## 2016-12-30 DIAGNOSIS — Z79899 Other long term (current) drug therapy: Secondary | ICD-10-CM | POA: Diagnosis not present

## 2016-12-30 DIAGNOSIS — Z792 Long term (current) use of antibiotics: Secondary | ICD-10-CM | POA: Diagnosis not present

## 2017-01-01 DIAGNOSIS — I252 Old myocardial infarction: Secondary | ICD-10-CM | POA: Diagnosis not present

## 2017-01-01 DIAGNOSIS — Z79899 Other long term (current) drug therapy: Secondary | ICD-10-CM | POA: Diagnosis not present

## 2017-01-01 DIAGNOSIS — Z792 Long term (current) use of antibiotics: Secondary | ICD-10-CM | POA: Diagnosis not present

## 2017-01-01 DIAGNOSIS — I110 Hypertensive heart disease with heart failure: Secondary | ICD-10-CM | POA: Diagnosis not present

## 2017-01-01 DIAGNOSIS — I5022 Chronic systolic (congestive) heart failure: Secondary | ICD-10-CM | POA: Diagnosis not present

## 2017-01-02 DIAGNOSIS — I5022 Chronic systolic (congestive) heart failure: Secondary | ICD-10-CM | POA: Diagnosis not present

## 2017-01-03 DIAGNOSIS — Z792 Long term (current) use of antibiotics: Secondary | ICD-10-CM | POA: Diagnosis not present

## 2017-01-03 DIAGNOSIS — I5022 Chronic systolic (congestive) heart failure: Secondary | ICD-10-CM | POA: Diagnosis not present

## 2017-01-03 DIAGNOSIS — I110 Hypertensive heart disease with heart failure: Secondary | ICD-10-CM | POA: Diagnosis not present

## 2017-01-03 DIAGNOSIS — I252 Old myocardial infarction: Secondary | ICD-10-CM | POA: Diagnosis not present

## 2017-01-03 DIAGNOSIS — Z79899 Other long term (current) drug therapy: Secondary | ICD-10-CM | POA: Diagnosis not present

## 2017-01-06 DIAGNOSIS — Z79899 Other long term (current) drug therapy: Secondary | ICD-10-CM | POA: Diagnosis not present

## 2017-01-06 DIAGNOSIS — I252 Old myocardial infarction: Secondary | ICD-10-CM | POA: Diagnosis not present

## 2017-01-06 DIAGNOSIS — Z792 Long term (current) use of antibiotics: Secondary | ICD-10-CM | POA: Diagnosis not present

## 2017-01-06 DIAGNOSIS — I110 Hypertensive heart disease with heart failure: Secondary | ICD-10-CM | POA: Diagnosis not present

## 2017-01-06 DIAGNOSIS — I5022 Chronic systolic (congestive) heart failure: Secondary | ICD-10-CM | POA: Diagnosis not present

## 2017-01-08 DIAGNOSIS — Z79899 Other long term (current) drug therapy: Secondary | ICD-10-CM | POA: Diagnosis not present

## 2017-01-08 DIAGNOSIS — Z792 Long term (current) use of antibiotics: Secondary | ICD-10-CM | POA: Diagnosis not present

## 2017-01-08 DIAGNOSIS — I252 Old myocardial infarction: Secondary | ICD-10-CM | POA: Diagnosis not present

## 2017-01-08 DIAGNOSIS — I110 Hypertensive heart disease with heart failure: Secondary | ICD-10-CM | POA: Diagnosis not present

## 2017-01-08 DIAGNOSIS — I5022 Chronic systolic (congestive) heart failure: Secondary | ICD-10-CM | POA: Diagnosis not present

## 2017-01-09 DIAGNOSIS — I5022 Chronic systolic (congestive) heart failure: Secondary | ICD-10-CM | POA: Diagnosis not present

## 2017-01-10 DIAGNOSIS — Z792 Long term (current) use of antibiotics: Secondary | ICD-10-CM | POA: Diagnosis not present

## 2017-01-10 DIAGNOSIS — I110 Hypertensive heart disease with heart failure: Secondary | ICD-10-CM | POA: Diagnosis not present

## 2017-01-10 DIAGNOSIS — I252 Old myocardial infarction: Secondary | ICD-10-CM | POA: Diagnosis not present

## 2017-01-10 DIAGNOSIS — I5022 Chronic systolic (congestive) heart failure: Secondary | ICD-10-CM | POA: Diagnosis not present

## 2017-01-10 DIAGNOSIS — Z79899 Other long term (current) drug therapy: Secondary | ICD-10-CM | POA: Diagnosis not present

## 2017-01-13 DIAGNOSIS — I5022 Chronic systolic (congestive) heart failure: Secondary | ICD-10-CM | POA: Diagnosis not present

## 2017-01-13 DIAGNOSIS — I252 Old myocardial infarction: Secondary | ICD-10-CM | POA: Diagnosis not present

## 2017-01-13 DIAGNOSIS — Z792 Long term (current) use of antibiotics: Secondary | ICD-10-CM | POA: Diagnosis not present

## 2017-01-13 DIAGNOSIS — Z79899 Other long term (current) drug therapy: Secondary | ICD-10-CM | POA: Diagnosis not present

## 2017-01-13 DIAGNOSIS — I110 Hypertensive heart disease with heart failure: Secondary | ICD-10-CM | POA: Diagnosis not present

## 2017-01-17 DIAGNOSIS — Z955 Presence of coronary angioplasty implant and graft: Secondary | ICD-10-CM | POA: Diagnosis not present

## 2017-01-20 DIAGNOSIS — Z955 Presence of coronary angioplasty implant and graft: Secondary | ICD-10-CM | POA: Diagnosis not present

## 2017-01-23 DIAGNOSIS — I110 Hypertensive heart disease with heart failure: Secondary | ICD-10-CM | POA: Diagnosis not present

## 2017-01-23 DIAGNOSIS — I25119 Atherosclerotic heart disease of native coronary artery with unspecified angina pectoris: Secondary | ICD-10-CM | POA: Diagnosis not present

## 2017-01-23 DIAGNOSIS — I773 Arterial fibromuscular dysplasia: Secondary | ICD-10-CM | POA: Diagnosis not present

## 2017-01-23 DIAGNOSIS — I5022 Chronic systolic (congestive) heart failure: Secondary | ICD-10-CM | POA: Diagnosis not present

## 2017-01-23 DIAGNOSIS — R931 Abnormal findings on diagnostic imaging of heart and coronary circulation: Secondary | ICD-10-CM | POA: Insufficient documentation

## 2017-01-29 DIAGNOSIS — I10 Essential (primary) hypertension: Secondary | ICD-10-CM | POA: Diagnosis not present

## 2017-01-29 DIAGNOSIS — G43109 Migraine with aura, not intractable, without status migrainosus: Secondary | ICD-10-CM | POA: Diagnosis not present

## 2017-01-29 DIAGNOSIS — E782 Mixed hyperlipidemia: Secondary | ICD-10-CM | POA: Diagnosis not present

## 2017-01-29 DIAGNOSIS — I5022 Chronic systolic (congestive) heart failure: Secondary | ICD-10-CM | POA: Diagnosis not present

## 2017-01-30 DIAGNOSIS — Z6835 Body mass index (BMI) 35.0-35.9, adult: Secondary | ICD-10-CM | POA: Diagnosis not present

## 2017-01-30 DIAGNOSIS — I110 Hypertensive heart disease with heart failure: Secondary | ICD-10-CM | POA: Diagnosis not present

## 2017-01-30 DIAGNOSIS — I5022 Chronic systolic (congestive) heart failure: Secondary | ICD-10-CM | POA: Diagnosis not present

## 2017-01-31 DIAGNOSIS — Z955 Presence of coronary angioplasty implant and graft: Secondary | ICD-10-CM | POA: Diagnosis not present

## 2017-02-03 DIAGNOSIS — Z955 Presence of coronary angioplasty implant and graft: Secondary | ICD-10-CM | POA: Diagnosis not present

## 2017-02-06 DIAGNOSIS — Z955 Presence of coronary angioplasty implant and graft: Secondary | ICD-10-CM | POA: Diagnosis not present

## 2017-02-10 DIAGNOSIS — Z955 Presence of coronary angioplasty implant and graft: Secondary | ICD-10-CM | POA: Diagnosis not present

## 2017-02-20 DIAGNOSIS — R931 Abnormal findings on diagnostic imaging of heart and coronary circulation: Secondary | ICD-10-CM | POA: Diagnosis not present

## 2017-02-20 DIAGNOSIS — Z01818 Encounter for other preprocedural examination: Secondary | ICD-10-CM | POA: Diagnosis not present

## 2017-02-20 DIAGNOSIS — I509 Heart failure, unspecified: Secondary | ICD-10-CM | POA: Diagnosis not present

## 2017-02-25 DIAGNOSIS — Z95810 Presence of automatic (implantable) cardiac defibrillator: Secondary | ICD-10-CM | POA: Insufficient documentation

## 2017-02-25 DIAGNOSIS — Z955 Presence of coronary angioplasty implant and graft: Secondary | ICD-10-CM | POA: Diagnosis not present

## 2017-02-25 DIAGNOSIS — I5022 Chronic systolic (congestive) heart failure: Secondary | ICD-10-CM | POA: Diagnosis not present

## 2017-02-25 DIAGNOSIS — Z6841 Body Mass Index (BMI) 40.0 and over, adult: Secondary | ICD-10-CM | POA: Diagnosis not present

## 2017-02-25 DIAGNOSIS — I110 Hypertensive heart disease with heart failure: Secondary | ICD-10-CM | POA: Diagnosis not present

## 2017-02-25 DIAGNOSIS — I509 Heart failure, unspecified: Secondary | ICD-10-CM | POA: Diagnosis not present

## 2017-02-25 DIAGNOSIS — I255 Ischemic cardiomyopathy: Secondary | ICD-10-CM | POA: Diagnosis not present

## 2017-02-25 DIAGNOSIS — I773 Arterial fibromuscular dysplasia: Secondary | ICD-10-CM | POA: Diagnosis not present

## 2017-02-25 DIAGNOSIS — I25119 Atherosclerotic heart disease of native coronary artery with unspecified angina pectoris: Secondary | ICD-10-CM | POA: Diagnosis not present

## 2017-02-25 DIAGNOSIS — E785 Hyperlipidemia, unspecified: Secondary | ICD-10-CM | POA: Diagnosis not present

## 2017-02-25 DIAGNOSIS — Z79899 Other long term (current) drug therapy: Secondary | ICD-10-CM | POA: Diagnosis not present

## 2017-02-25 DIAGNOSIS — I480 Paroxysmal atrial fibrillation: Secondary | ICD-10-CM | POA: Diagnosis not present

## 2017-02-25 DIAGNOSIS — I517 Cardiomegaly: Secondary | ICD-10-CM | POA: Diagnosis not present

## 2017-02-25 DIAGNOSIS — Z7982 Long term (current) use of aspirin: Secondary | ICD-10-CM | POA: Diagnosis not present

## 2017-02-25 DIAGNOSIS — E669 Obesity, unspecified: Secondary | ICD-10-CM | POA: Diagnosis not present

## 2017-02-25 DIAGNOSIS — R931 Abnormal findings on diagnostic imaging of heart and coronary circulation: Secondary | ICD-10-CM | POA: Diagnosis not present

## 2017-02-25 DIAGNOSIS — I252 Old myocardial infarction: Secondary | ICD-10-CM | POA: Diagnosis not present

## 2017-02-25 HISTORY — DX: Presence of automatic (implantable) cardiac defibrillator: Z95.810

## 2017-02-26 DIAGNOSIS — I773 Arterial fibromuscular dysplasia: Secondary | ICD-10-CM | POA: Diagnosis not present

## 2017-02-26 DIAGNOSIS — E669 Obesity, unspecified: Secondary | ICD-10-CM | POA: Diagnosis not present

## 2017-02-26 DIAGNOSIS — I480 Paroxysmal atrial fibrillation: Secondary | ICD-10-CM | POA: Diagnosis not present

## 2017-02-26 DIAGNOSIS — I255 Ischemic cardiomyopathy: Secondary | ICD-10-CM | POA: Diagnosis not present

## 2017-02-26 DIAGNOSIS — E785 Hyperlipidemia, unspecified: Secondary | ICD-10-CM | POA: Diagnosis not present

## 2017-02-26 DIAGNOSIS — Z955 Presence of coronary angioplasty implant and graft: Secondary | ICD-10-CM | POA: Diagnosis not present

## 2017-02-26 DIAGNOSIS — Z6841 Body Mass Index (BMI) 40.0 and over, adult: Secondary | ICD-10-CM | POA: Diagnosis not present

## 2017-02-26 DIAGNOSIS — I5022 Chronic systolic (congestive) heart failure: Secondary | ICD-10-CM | POA: Diagnosis not present

## 2017-02-26 DIAGNOSIS — I110 Hypertensive heart disease with heart failure: Secondary | ICD-10-CM | POA: Diagnosis not present

## 2017-02-26 DIAGNOSIS — I25119 Atherosclerotic heart disease of native coronary artery with unspecified angina pectoris: Secondary | ICD-10-CM | POA: Diagnosis not present

## 2017-02-26 DIAGNOSIS — I252 Old myocardial infarction: Secondary | ICD-10-CM | POA: Diagnosis not present

## 2017-02-26 DIAGNOSIS — Z7982 Long term (current) use of aspirin: Secondary | ICD-10-CM | POA: Diagnosis not present

## 2017-02-26 DIAGNOSIS — Z79899 Other long term (current) drug therapy: Secondary | ICD-10-CM | POA: Diagnosis not present

## 2017-03-11 DIAGNOSIS — I5023 Acute on chronic systolic (congestive) heart failure: Secondary | ICD-10-CM | POA: Diagnosis not present

## 2017-03-11 DIAGNOSIS — Z4502 Encounter for adjustment and management of automatic implantable cardiac defibrillator: Secondary | ICD-10-CM | POA: Diagnosis not present

## 2017-04-17 DIAGNOSIS — I5022 Chronic systolic (congestive) heart failure: Secondary | ICD-10-CM | POA: Diagnosis not present

## 2017-05-09 DIAGNOSIS — I5022 Chronic systolic (congestive) heart failure: Secondary | ICD-10-CM | POA: Diagnosis not present

## 2017-05-09 DIAGNOSIS — E782 Mixed hyperlipidemia: Secondary | ICD-10-CM | POA: Diagnosis not present

## 2017-05-09 DIAGNOSIS — I10 Essential (primary) hypertension: Secondary | ICD-10-CM | POA: Diagnosis not present

## 2017-05-09 DIAGNOSIS — I773 Arterial fibromuscular dysplasia: Secondary | ICD-10-CM | POA: Diagnosis not present

## 2017-05-18 ENCOUNTER — Encounter: Payer: Self-pay | Admitting: Cardiology

## 2017-05-20 ENCOUNTER — Encounter: Payer: Self-pay | Admitting: Cardiology

## 2017-05-20 ENCOUNTER — Ambulatory Visit (INDEPENDENT_AMBULATORY_CARE_PROVIDER_SITE_OTHER): Payer: BLUE CROSS/BLUE SHIELD | Admitting: Cardiology

## 2017-05-20 VITALS — BP 102/70 | HR 91 | Ht 65.0 in | Wt 218.8 lb

## 2017-05-20 DIAGNOSIS — I25119 Atherosclerotic heart disease of native coronary artery with unspecified angina pectoris: Secondary | ICD-10-CM | POA: Insufficient documentation

## 2017-05-20 DIAGNOSIS — I5022 Chronic systolic (congestive) heart failure: Secondary | ICD-10-CM

## 2017-05-20 DIAGNOSIS — I110 Hypertensive heart disease with heart failure: Secondary | ICD-10-CM

## 2017-05-20 DIAGNOSIS — Z95810 Presence of automatic (implantable) cardiac defibrillator: Secondary | ICD-10-CM

## 2017-05-20 MED ORDER — METOPROLOL SUCCINATE ER 25 MG PO TB24: 25.0000 mg | ORAL_TABLET | Freq: Two times a day (BID) | ORAL | 37 refills | Status: DC

## 2017-05-20 NOTE — Progress Notes (Signed)
Cardiology Office Note:    Date:  05/20/2017   ID:  Allison White, DOB 06/29/1961, MRN [id number]  PCP:  Rochel Brome, MD  Cardiologist:  Shirlee More, MD    Referring MD: Lillard Anes,*    ASSESSMENT:    1. Chronic systolic congestive heart failure ([employer])   2. Hypertensive heart disease with heart failure ([place])   3. Coronary artery disease involving native coronary artery of native heart with angina pectoris ([place])   4. Presence of automatic (implantable) cardiac defibrillator    PLAN:    In order of problems listed above:  1. Stable compensated I given her instructions to take an extra dose of diuretic with weight increase. Clinically with a marked decrease in BNP level improved blood pressure and an EKG that does not show extensive scar I suspect her ejection fraction is improved and will recheck in August. Continue her current beta blocker ACE inhibitor and loop diuretic. If ejection fraction remains less than 40 SPIRONOLACTONE. 2. Stable improved continue current treatment 3. Stable continue current treatment including dual antiplatelet beta blocker and high intensity statin 4. Stable her device is followed at [facility] cardiology device clinic have requested a copy of her last download.  Next appointment: 3 months   Medication Adjustments/Labs and Tests Ordered: Current medicines are reviewed at length with the patient today.  Concerns regarding medicines are outlined above.  Orders Placed This Encounter  Procedures  . Basic Metabolic Panel (BMET)  . B Nat Peptide  . EKG 12-Lead  . ECHOCARDIOGRAM COMPLETE   Meds ordered this encounter  Medications  . metoprolol succinate (TOPROL-XL) 25 MG 24 hr tablet    Sig: Take 1 tablet (25 mg total) by mouth 2 (two) times daily. Take an extra 12.5 mg mid day    Dispense:  90 tablet    Refill:  73    Chief Complaint  Patient presents with  . Follow-up  . Coronary Artery Disease    Pt has c/o heart still "beating  crazy"  . Congestive Heart Failure    History of Present Illness:    Allison White is a 56 y.o. female with a hx of Anterior-LAD STEMI 11/19/16 and Xience drug-eluting stent to the proximal LAD with angioplasty to the LAD-diagonal bifurcation , CAD,systolicCHF poorly tolerant of ACEI with hypotension , HTN with severelly reduced EF 20-25% and  ICD palcement 02/25/17.Her weight is a very 4-5 pounds per day and she is improved since taking extra dose of diuretic. Her home blood pressures are improved running 294-765 systolic and now tolerates her beta blocker and ACE inhibitor. She's had no angina edema PND sncope or TIA. She does complain of frequent palpitation and at times feels her heart racing she's been seen in pacemaker ICD follow-up I have requested a copy of her last device check. She is not on an antiarrhythmic drug. I asked her to increase the dose of her beta blocker. Compliance with diet, lifestyle and medications: Yes Past Medical History:  Diagnosis Date  . Headache(784.0)   . HTN (hypertension)   . Hyperlipidemia   . Nonruptured cerebral aneurysm, internal carotid artery    Left side, stent placement (2009)  . Vertigo     Past Surgical History:  Procedure Laterality Date  . ABDOMINAL HYSTERECTOMY    . CEREBRAL ANEURYSM REPAIR Left 2009    Current Medications: Current Meds  Medication Sig  . aspirin 81 MG chewable tablet Chew 81 mg by mouth daily.  [name] atorvastatin (LIPITOR) 80 MG  tablet Take 80 mg by mouth daily.  . furosemide (LASIX) 20 MG tablet Take 20 mg by mouth 2 (two) times daily. Do not take if you weigh 214 or less  . lisinopril (PRINIVIL,ZESTRIL) 2.5 MG tablet Take 2.5 mg by mouth daily.  . metoprolol succinate (TOPROL-XL) 25 MG 24 hr tablet Take 1 tablet (25 mg total) by mouth 2 (two) times daily. Take an extra 12.5 mg mid day  . nitroGLYCERIN (NITROSTAT) 0.4 MG SL tablet Place 0.4 mg under the tongue.  . potassium chloride (K-DUR) 10 MEQ tablet Take 10 mEq  by mouth daily.  . ticagrelor (BRILINTA) 90 MG TABS tablet Take 90 mg by mouth 2 (two) times daily.  . [DISCONTINUED] metoprolol succinate (TOPROL-XL) 25 MG 24 hr tablet Take 25 mg by mouth 2 (two) times daily.     Allergies:   Clopidogrel; Tape; and Plavix [clopidogrel bisulfate]   Social History   Social History  . Marital status: Married    Spouse name: N/A  . Number of children: N/A  . Years of education: N/A   Social History Main Topics  . Smoking status: Former Smoker    Packs/day: 1.00    Years: 6.00    Types: Cigarettes  . Smokeless tobacco: Never Used     Comment: Quit 30 years ago  . Alcohol use Yes     Comment: Occasionally  . Drug use: No  . Sexual activity: Not Asked   Other Topics Concern  . None   Social History Narrative   Working in Systems developer at [employer].  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       Family History: The patient's family history includes Cancer in her father; Cerebral aneurysm in her paternal grandmother; Hypertension in her brother; Thyroid disease in her mother. ROS:   Please see the history of present illness.    All other systems reviewed and are negative.  EKGs/Labs/Other Studies Reviewed:    The following studies were reviewed today:  EKG:  EKG is  ordered today.  The ekg ordered today demonstrates [facility] LAD LAHB  Recent Labs:CMP normal 04/17/17 BNP 2385, down from peak 7662 No results found for requested labs within last 8760 hours.  Recent Lipid Panel 04/17/17 LDL 60 HDL 47 Chol 141   Physical Exam:    VS:  BP 102/70   Pulse 91   Ht 5\' 5"  (1.651 m)   Wt 218 lb 12.8 oz (99.2 kg)   SpO2 97%   BMI 36.41 kg/m     Wt Readings from Last 3 Encounters:  05/20/17 218 lb 12.8 oz (99.2 kg)  01/31/15 215 lb 4 oz (97.6 kg)  08/16/14 209 lb 5 oz (94.9 kg)     GEN:  Well nourished, well developed in no acute distress HEENT: Normal NECK: No JVD; No carotid bruits LYMPHATICS: No lymphadenopathy CARDIAC: RRR, no  murmurs, rubs, gallops RESPIRATORY:  Clear to auscultation without rales, wheezing or rhonchi  ABDOMEN: Soft, non-tender, non-distended MUSCULOSKELETAL:  No edema; No deformity  SKIN: Warm and dry NEUROLOGIC:  Alert and oriented x 3 PSYCHIATRIC:  Normal affect    Signed, Shirlee More, MD  05/20/2017 2:48 PM    [facility]

## 2017-05-20 NOTE — Patient Instructions (Addendum)
Medication Instructions:  Your physician has recommended you make the following change in your medication: take an extra metoprolol 12.5 in the middle of the day if you're feeling okay.    Labwork: Your physician recommends that you return for lab work in: today. BMP, BNP.   Testing/Procedures: Your physician has requested that you have an echocardiogram. Echocardiography is a painless test that uses sound waves to create images of your heart. It provides your doctor with information about the size and shape of your heart and how well your heart's chambers and valves are working. This procedure takes approximately one hour. There are no restrictions for this procedure. Your physician would like for you to complete this test in 1 month.   Follow-Up: Your physician recommends that you schedule a follow-up appointment in: 3 months.   Any Other Special Instructions Will Be Listed Below (If Applicable).     If you need a refill on your cardiac medications before your next appointment, please call your pharmacy.

## 2017-05-22 ENCOUNTER — Telehealth: Payer: Self-pay

## 2017-05-22 DIAGNOSIS — I5022 Chronic systolic (congestive) heart failure: Secondary | ICD-10-CM

## 2017-05-22 MED ORDER — SPIRONOLACTONE 25 MG PO TABS: 12.5000 mg | ORAL_TABLET | Freq: Every day | ORAL | 12 refills | Status: DC

## 2017-05-22 NOTE — Telephone Encounter (Signed)
Patient advised to stop potassium. Start spironolactone 12.5 mg daily. Prescription sent to [facility] in [place] per patient request. Advised for patient to get blood work in 1-2 weeks at [facility] in [place]. Order placed in chart. Patient verbalized understanding.

## 2017-05-25 LAB — BASIC METABOLIC PANEL
BUN/Creatinine Ratio: 20 (ref 9–23)
BUN: 21 mg/dL (ref 6–24)
CO2: 23 mmol/L (ref 20–29)
Calcium: 10 mg/dL (ref 8.7–10.2)
Chloride: 99 mmol/L (ref 96–106)
Creatinine, Ser: 1.05 mg/dL — ABNORMAL HIGH (ref 0.57–1.00)
GFR calc non Af Amer: 60 mL/min/{1.73_m2} (ref >59)
GFR, EST AFRICAN AMERICAN: 69 mL/min/{1.73_m2} (ref >59)
Glucose: 95 mg/dL (ref 65–99)
Potassium: 4.7 mmol/L (ref 3.5–5.2)
Sodium: 140 mmol/L (ref 134–144)

## 2017-05-25 LAB — BRAIN NATRIURETIC PEPTIDE

## 2017-06-04 ENCOUNTER — Telehealth: Payer: Self-pay

## 2017-06-04 ENCOUNTER — Other Ambulatory Visit: Payer: Self-pay

## 2017-06-04 ENCOUNTER — Other Ambulatory Visit: Payer: Self-pay | Admitting: Cardiology

## 2017-06-04 DIAGNOSIS — I5022 Chronic systolic (congestive) heart failure: Secondary | ICD-10-CM | POA: Diagnosis not present

## 2017-06-04 NOTE — Telephone Encounter (Signed)
Order faxed as requested.cn

## 2017-06-05 LAB — BASIC METABOLIC PANEL
BUN/Creatinine Ratio: 22 (ref 9–23)
BUN: 23 mg/dL (ref 6–24)
CHLORIDE: 99 mmol/L (ref 96–106)
CO2: 28 mmol/L (ref 20–29)
Calcium: 10.1 mg/dL (ref 8.7–10.2)
Creatinine, Ser: 1.06 mg/dL — ABNORMAL HIGH (ref 0.57–1.00)
GFR calc non Af Amer: 59 mL/min/{1.73_m2} — ABNORMAL LOW (ref >59)
GFR, EST AFRICAN AMERICAN: 68 mL/min/{1.73_m2} (ref >59)
Glucose: 118 mg/dL — ABNORMAL HIGH (ref 65–99)
Potassium: 4.6 mmol/L (ref 3.5–5.2)
Sodium: 138 mmol/L (ref 134–144)

## 2017-06-19 ENCOUNTER — Ambulatory Visit (HOSPITAL_BASED_OUTPATIENT_CLINIC_OR_DEPARTMENT_OTHER): Payer: BLUE CROSS/BLUE SHIELD

## 2017-07-02 ENCOUNTER — Ambulatory Visit (HOSPITAL_BASED_OUTPATIENT_CLINIC_OR_DEPARTMENT_OTHER)
Admission: RE | Admit: 2017-07-02 | Discharge: 2017-07-02 | Disposition: A | Discharge status: Home / Self Care | Payer: BLUE CROSS/BLUE SHIELD | Source: Ambulatory Visit | Attending: Cardiology | Admitting: Cardiology

## 2017-07-02 DIAGNOSIS — I5022 Chronic systolic (congestive) heart failure: Secondary | ICD-10-CM | POA: Insufficient documentation

## 2017-07-02 DIAGNOSIS — I081 Rheumatic disorders of both mitral and tricuspid valves: Secondary | ICD-10-CM | POA: Diagnosis not present

## 2017-07-02 NOTE — Progress Notes (Signed)
  Echocardiogram 2D Echocardiogram has been performed.  Allison White Allison White 07/02/2017, 9:37 AM

## 2017-07-16 ENCOUNTER — Telehealth: Payer: Self-pay | Admitting: *Deleted

## 2017-07-16 NOTE — Telephone Encounter (Signed)
Received request for Medical records from [place], forwarded to [place] for email/scan/SLS 08/29

## 2017-07-18 ENCOUNTER — Emergency Department (HOSPITAL_BASED_OUTPATIENT_CLINIC_OR_DEPARTMENT_OTHER)
Admission: EM | Admit: 2017-07-18 | Discharge: 2017-07-18 | Disposition: A | Discharge status: Home / Self Care | Payer: BLUE CROSS/BLUE SHIELD | Attending: Emergency Medicine | Admitting: Emergency Medicine

## 2017-07-18 ENCOUNTER — Encounter (HOSPITAL_BASED_OUTPATIENT_CLINIC_OR_DEPARTMENT_OTHER): Payer: Self-pay | Admitting: *Deleted

## 2017-07-18 ENCOUNTER — Emergency Department (HOSPITAL_BASED_OUTPATIENT_CLINIC_OR_DEPARTMENT_OTHER): Payer: BLUE CROSS/BLUE SHIELD

## 2017-07-18 ENCOUNTER — Telehealth: Payer: Self-pay | Admitting: Cardiology

## 2017-07-18 DIAGNOSIS — Z7902 Long term (current) use of antithrombotics/antiplatelets: Secondary | ICD-10-CM | POA: Diagnosis not present

## 2017-07-18 DIAGNOSIS — R079 Chest pain, unspecified: Secondary | ICD-10-CM | POA: Diagnosis not present

## 2017-07-18 DIAGNOSIS — Z87891 Personal history of nicotine dependence: Secondary | ICD-10-CM | POA: Diagnosis not present

## 2017-07-18 DIAGNOSIS — I110 Hypertensive heart disease with heart failure: Secondary | ICD-10-CM | POA: Insufficient documentation

## 2017-07-18 DIAGNOSIS — Z79899 Other long term (current) drug therapy: Secondary | ICD-10-CM | POA: Insufficient documentation

## 2017-07-18 DIAGNOSIS — I259 Chronic ischemic heart disease, unspecified: Secondary | ICD-10-CM | POA: Insufficient documentation

## 2017-07-18 DIAGNOSIS — I252 Old myocardial infarction: Secondary | ICD-10-CM | POA: Insufficient documentation

## 2017-07-18 DIAGNOSIS — R0781 Pleurodynia: Secondary | ICD-10-CM | POA: Diagnosis not present

## 2017-07-18 DIAGNOSIS — Z7982 Long term (current) use of aspirin: Secondary | ICD-10-CM | POA: Diagnosis not present

## 2017-07-18 DIAGNOSIS — I5022 Chronic systolic (congestive) heart failure: Secondary | ICD-10-CM | POA: Diagnosis not present

## 2017-07-18 HISTORY — DX: Abnormal result of cardiovascular function study, unspecified: R94.30

## 2017-07-18 HISTORY — DX: Acute myocardial infarction, unspecified: I21.9

## 2017-07-18 LAB — TROPONIN I: Troponin I: <0.03 ng/mL (ref <0.03)

## 2017-07-18 LAB — CBC WITH DIFFERENTIAL/PLATELET
Basophils Absolute: 0 10*3/uL (ref 0.0–0.1)
Basophils Relative: 0 %
Eosinophils Absolute: 0.1 10*3/uL (ref 0.0–0.7)
Eosinophils Relative: 1 %
HCT: 42.6 % (ref 36.0–46.0)
Hemoglobin: 14.8 g/dL (ref 12.0–15.0)
Lymphocytes Relative: 8 %
Lymphs Abs: 1.1 10*3/uL (ref 0.7–4.0)
MCH: 31.4 pg (ref 26.0–34.0)
MCHC: 34.7 g/dL (ref 30.0–36.0)
MCV: 90.4 fL (ref 78.0–100.0)
Monocytes Absolute: 1.6 10*3/uL — ABNORMAL HIGH (ref 0.1–1.0)
Monocytes Relative: 12 %
Neutro Abs: 10.9 10*3/uL — ABNORMAL HIGH (ref 1.7–7.7)
Neutrophils Relative %: 79 %
Platelets: 235 10*3/uL (ref 150–400)
RBC: 4.71 MIL/uL (ref 3.87–5.11)
RDW: 13.8 % (ref 11.5–15.5)
WBC: 13.7 10*3/uL — ABNORMAL HIGH (ref 4.0–10.5)

## 2017-07-18 LAB — BASIC METABOLIC PANEL
Anion gap: 9 (ref 5–15)
BUN: 21 mg/dL — ABNORMAL HIGH (ref 6–20)
CO2: 29 mmol/L (ref 22–32)
Calcium: 9.4 mg/dL (ref 8.9–10.3)
Chloride: 98 mmol/L — ABNORMAL LOW (ref 101–111)
Creatinine, Ser: 0.96 mg/dL (ref 0.44–1.00)
GFR calc Af Amer: >60 mL/min (ref >60)
GFR calc non Af Amer: >60 mL/min (ref >60)
Glucose, Bld: 109 mg/dL — ABNORMAL HIGH (ref 65–99)
Potassium: 3.9 mmol/L (ref 3.5–5.1)
Sodium: 136 mmol/L (ref 135–145)

## 2017-07-18 LAB — D-DIMER, QUANTITATIVE (NOT AT ARMC): D-Dimer, Quant: 0.38 ug/mL-FEU (ref 0.00–0.50)

## 2017-07-18 NOTE — ED Triage Notes (Signed)
Pt reports substernal CP on deep inspiration that started yesterday. Reports back pain and left arm numbness that started last night. No acute distress noted. No tenderness on palpation. Reports recent cough.

## 2017-07-18 NOTE — ED Provider Notes (Signed)
[facility] DEPT [facility] Provider Note   CSN: [id number] Arrival date & time: 07/18/17  9024     History   Chief Complaint Chief Complaint  Patient presents with  . Chest Pain    HPI Allison White is a 56 y.o. female.  HPI   56 year old female with chest pain. Substernal. Onset yesterday. Reports sharp pain that worse with certain movements and breathing deeply. Sometimes pain or back and left arm. No appreciable exacerbating relieving factors. She has had a nonproductive cough. No dyspnea. No fevers or chills. No unusual leg pain or swelling.  Past Medical History:  Diagnosis Date  . Ejection fraction < 50%    23% as of August 2018  . Headache(784.0)   . HTN (hypertension)   . Hyperlipidemia   . MI (myocardial infarction) ([place])   . Nonruptured cerebral aneurysm, internal carotid artery    Left side, stent placement (2009)  . Vertigo     Patient Active Problem List   Diagnosis Date Noted  . CAD (coronary artery disease) 05/20/2017  . Presence of automatic (implantable) cardiac defibrillator 02/25/2017  . Fibromuscular dysplasia ([place]) 12/06/2016  . Chronic systolic congestive heart failure ([place]) 12/05/2016  . Dyslipidemia 12/05/2016  . Hypertensive heart disease with heart failure ([place]) 12/05/2016  . Pleural effusion 12/01/2016  . Acute ST elevation myocardial infarction (STEMI) involving left anterior descending (LAD) coronary artery ([place]) 11/19/2016  . Vertebral artery dissection ([place]) 11/19/2016  . Intractable migraine without aura and with status migrainosus 01/31/2015  . Chronic daily headache 07/23/2013  . Carotid aneurysm, left ([place]) 07/23/2013  . Vertigo   . Headache(784.0)   . Hyperlipidemia   . Hypercholesteremia   . HTN (hypertension)     Past Surgical History:  Procedure Laterality Date  . ABDOMINAL HYSTERECTOMY    . CARDIAC DEFIBRILLATOR PLACEMENT    . CEREBRAL ANEURYSM REPAIR Left 2009  . CORONARY ANGIOPLASTY WITH STENT PLACEMENT    .  PACEMAKER IMPLANT      OB History    No data available       Home Medications    Prior to Admission medications   Medication Sig Start Date End Date Taking? Authorizing Provider  aspirin 325 MG tablet Take 325 mg by mouth daily.    [provider]  aspirin 81 MG chewable tablet Chew 81 mg by mouth daily. 02/26/17 04/03/20  [provider]  atorvastatin (LIPITOR) 80 MG tablet Take 80 mg by mouth daily. 02/26/17 04/03/20  [provider]  furosemide (LASIX) 20 MG tablet Take 20 mg by mouth 2 (two) times daily. Do not take if you weigh 214 or less 04/03/17 03/29/18  [provider]  HYDROcodone-acetaminophen (NORCO) 7.5-325 MG per tablet Take 1 tablet by mouth every 6 (six) hours as needed for moderate pain.    [provider]  HYDROcodone-acetaminophen (NORCO) 7.5-325 MG tablet Take by mouth every 8 (eight) hours as needed.    [provider]  lisinopril (PRINIVIL,ZESTRIL) 2.5 MG tablet Take 2.5 mg by mouth daily. 02/10/17 02/10/18  [provider]  lisinopril (PRINIVIL,ZESTRIL) 5 MG tablet Take 1 tablet by mouth Daily. 06/18/12   [provider]  Meth-Hyo-M Bl-Na Phos-Ph Sal (URIBEL) 118 MG CAPS Take by mouth 4 (four) times daily.    [provider]  metoprolol succinate (TOPROL-XL) 25 MG 24 hr tablet Take 1 tablet (25 mg total) by mouth 2 (two) times daily. Take an extra 12.5 mg mid day 05/20/17 06/25/20  Richardo Priest, MD  nitroGLYCERIN (  NITROSTAT) 0.4 MG SL tablet Place 0.4 mg under the tongue. 11/25/16 11/25/17  [provider]  phenazopyridine (PYRIDIUM) 97 MG tablet Take 97 mg by mouth 3 (three) times daily as needed for pain.    [provider]  Phenazopyridine HCl 97.5 MG TABS Take by mouth daily as needed.    [provider]  spironolactone (ALDACTONE) 25 MG tablet Take 0.5 tablets (12.5 mg total) by mouth daily. 05/22/17   Richardo Priest, MD  ticagrelor (BRILINTA) 90 MG TABS tablet Take 90  mg by mouth 2 (two) times daily. 02/26/17 04/03/20  [provider]    Family History Family History  Problem Relation Age of Onset  . Thyroid disease Mother   . Cancer Father        Prostate  . Hypertension Brother   . Cerebral aneurysm Paternal Grandmother        Nonruptured    Social History Social History  Substance Use Topics  . Smoking status: Former Smoker    Packs/day: 1.00    Years: 6.00    Types: Cigarettes  . Smokeless tobacco: Never Used     Comment: Quit 30 years ago  . Alcohol use Yes     Comment: Occasionally     Allergies   Clopidogrel; Tape; and Plavix [clopidogrel bisulfate]   Review of Systems Review of Systems  All systems reviewed and negative, other than as noted in HPI.  Physical Exam Updated Vital Signs BP 101/65 (BP Location: Left Arm)   Pulse 98   Temp 98.3 F (36.8 C) (Oral)   Resp 20   Ht 5\' 5"  (1.651 m)   Wt 101.6 kg (224 lb)   SpO2 97%   BMI 37.28 kg/m   Physical Exam  Constitutional: She appears well-developed and well-nourished. No distress.  HENT:  Head: Normocephalic and atraumatic.  Eyes: Conjunctivae are normal. Right eye exhibits no discharge. Left eye exhibits no discharge.  Neck: Neck supple.  Cardiovascular: Normal rate, regular rhythm and normal heart sounds.  Exam reveals no gallop and no friction rub.   No murmur heard. Pulmonary/Chest: Effort normal and breath sounds normal. No respiratory distress.  Abdominal: Soft. She exhibits no distension. There is no tenderness.  Musculoskeletal: She exhibits no edema or tenderness.  Lower extremities symmetric as compared to each other. No calf tenderness. Negative Homan's. No palpable cords.   Neurological: She is alert.  Skin: Skin is warm and dry.  Psychiatric: She has a normal mood and affect. Her behavior is normal. Thought content normal.  Nursing note and vitals reviewed.    ED Treatments / Results  Labs (all labs ordered are listed, but only  abnormal results are displayed) Labs Reviewed  CBC WITH DIFFERENTIAL/PLATELET - Abnormal; Notable for the following:       Result Value   WBC 13.7 (*)    Neutro Abs 10.9 (*)    Monocytes Absolute 1.6 (*)    All other components within normal limits  BASIC METABOLIC PANEL - Abnormal; Notable for the following:    Chloride 98 (*)    Glucose, Bld 109 (*)    BUN 21 (*)    All other components within normal limits  TROPONIN I  D-DIMER, QUANTITATIVE (NOT AT [facility])    EKG  EKG Interpretation  Date/Time:  Friday July 18 2017 09:49:33 EDT Ventricular Rate:  101 PR Interval:    QRS Duration: 96 QT Interval:  350 QTC Calculation: 454 R Axis:   -81 Text Interpretation:  Sinus tachycardia Left anterior fascicular block Anteroseptal infarct, old Nonspecific T abnormalities, lateral leads Baseline wander in lead(s) I II III aVR aVL V1 Confirmed by Virgel Manifold [phone]) on 07/18/2017 9:56:26 AM       Radiology No results found.  Procedures Procedures (including critical care time)  Medications Ordered in ED Medications - No data to display   Initial Impression / Assessment and Plan / ED Course  I have reviewed the triage vital signs and the nursing notes.  Pertinent labs & imaging results that were available during my care of the patient were reviewed by me and considered in my medical decision making (see chart for details).     56yF with CP. Seems atypical for ACS. Brief, sharp pain which sounds pleuritic. CXR w/o acute findings. D-dimer is normal. Afebrile. No cough. o2 sats normal. Normal WOB.   Final Clinical Impressions(s) / ED Diagnoses   Final diagnoses:  Pleuritic chest pain    New Prescriptions New Prescriptions   No medications on file     Virgel Manifold, MD 07/28/17 1004

## 2017-07-18 NOTE — ED Notes (Signed)
Pt on cardiac monitor and auto VS 

## 2017-07-18 NOTE — Telephone Encounter (Signed)
Contacted patient. Patient advised to go to ED. Patient verbalized understanding.

## 2017-07-18 NOTE — Telephone Encounter (Signed)
Pt called c/o CP and back pain, took nitro with no relief, advised to go to nearest ER

## 2017-07-31 DIAGNOSIS — I429 Cardiomyopathy, unspecified: Secondary | ICD-10-CM | POA: Insufficient documentation

## 2017-07-31 DIAGNOSIS — Z4502 Encounter for adjustment and management of automatic implantable cardiac defibrillator: Secondary | ICD-10-CM | POA: Diagnosis not present

## 2017-08-12 DIAGNOSIS — E782 Mixed hyperlipidemia: Secondary | ICD-10-CM | POA: Diagnosis not present

## 2017-08-12 DIAGNOSIS — I5022 Chronic systolic (congestive) heart failure: Secondary | ICD-10-CM | POA: Diagnosis not present

## 2017-08-12 DIAGNOSIS — I10 Essential (primary) hypertension: Secondary | ICD-10-CM | POA: Diagnosis not present

## 2017-08-12 DIAGNOSIS — Z23 Encounter for immunization: Secondary | ICD-10-CM | POA: Diagnosis not present

## 2017-08-12 DIAGNOSIS — I773 Arterial fibromuscular dysplasia: Secondary | ICD-10-CM | POA: Diagnosis not present

## 2017-08-20 ENCOUNTER — Ambulatory Visit (INDEPENDENT_AMBULATORY_CARE_PROVIDER_SITE_OTHER): Payer: BLUE CROSS/BLUE SHIELD | Admitting: Cardiology

## 2017-08-20 ENCOUNTER — Ambulatory Visit: Payer: BLUE CROSS/BLUE SHIELD | Admitting: Cardiology

## 2017-08-20 ENCOUNTER — Encounter: Payer: Self-pay | Admitting: Cardiology

## 2017-08-20 VITALS — BP 116/64 | HR 94 | Ht 65.0 in | Wt 220.1 lb

## 2017-08-20 DIAGNOSIS — I5022 Chronic systolic (congestive) heart failure: Secondary | ICD-10-CM | POA: Diagnosis not present

## 2017-08-20 DIAGNOSIS — I110 Hypertensive heart disease with heart failure: Secondary | ICD-10-CM

## 2017-08-20 DIAGNOSIS — I25119 Atherosclerotic heart disease of native coronary artery with unspecified angina pectoris: Secondary | ICD-10-CM | POA: Diagnosis not present

## 2017-08-20 DIAGNOSIS — E785 Hyperlipidemia, unspecified: Secondary | ICD-10-CM | POA: Diagnosis not present

## 2017-08-20 DIAGNOSIS — Z95810 Presence of automatic (implantable) cardiac defibrillator: Secondary | ICD-10-CM | POA: Diagnosis not present

## 2017-08-20 MED ORDER — SACUBITRIL-VALSARTAN 24-26 MG PO TABS: 1.0000 | ORAL_TABLET | Freq: Two times a day (BID) | ORAL | 1 refills | Status: DC

## 2017-08-20 NOTE — Progress Notes (Signed)
Cardiology Office Note:    Date:  08/20/2017   ID:  Allison White, DOB 03/23/1961, MRN [id number]  PCP:  Lillard Anes, MD  Cardiologist:  Shirlee More, MD    Referring MD: Rochel Brome, MD    ASSESSMENT:    1. Chronic systolic congestive heart failure ([place])   2. Hypertensive heart disease with heart failure ([place])   3. Coronary artery disease involving native coronary artery of native heart with angina pectoris ([place])   4. Hyperlipidemia, unspecified hyperlipidemia type   5. Presence of automatic (implantable) cardiac defibrillator    PLAN:    In order of problems listed above:  1. He has severe left ventricular dysfunction and reduced ejection fraction but is functionally improved New [place] Heart Association class I to class II. Fortunately she tolerates medical treatment initially hypotension was limiting. I'll continue her current beta blocker loop diuretic distal diuretic and transition from ACE inhibitor to ARNI. She'll be seen one month to up titration 2. Stable tolerating current heart failure medications 3. Stable having no angina continue current treatment including statin and dual antiplatelet therapy 4. Stable continue her current statin I requested recent lipids performed with her PC as well as CMP for liver and renal function 5. Stable she'll transition to EP care in my practice   Next appointment: 4 weeks   Medication Adjustments/Labs and Tests Ordered: Current medicines are reviewed at length with the patient today.  Concerns regarding medicines are outlined above.  No orders of the defined types were placed in this encounter.  No orders of the defined types were placed in this encounter.   Chief Complaint  Patient presents with  . Follow-up  . Chest Pain    History of Present Illness:    Allison White is a 56 y.o. female with a hx of  Anterior-LAD STEMI 11/19/16 and Xience drug-eluting stent to the proximal LAD with angioplasty to the  LAD-diagonal bifurcation , CAD,systolicCHF poorly tolerant of ACEI with hypotension , HTN with severelly reduced EF 20-25% and  ICD palcement 02/25/17 last seen 05/20/17.Her most recent EF 23% in August.despite and unchanged ejection fraction she is functionally improved and is able to do light housework and light garden work and has no edema dyspnea chest pain syncope or TIA. She has intermittent palpitation not severe sustained and no significant arrhythmia on device check. She tells me a comment was made the last time or device was checked in the office that she is now paced 17% of the time. She wishes to transition her device care to my practice. Compliance with diet, lifestyle and medications: yes Past Medical History:  Diagnosis Date  . Ejection fraction < 50%    23% as of August 2018  . Headache(784.0)   . HTN (hypertension)   . Hyperlipidemia   . MI (myocardial infarction) ([place])   . Nonruptured cerebral aneurysm, internal carotid artery    Left side, stent placement (2009)  . Vertigo     Past Surgical History:  Procedure Laterality Date  . ABDOMINAL HYSTERECTOMY    . CARDIAC DEFIBRILLATOR PLACEMENT    . CEREBRAL ANEURYSM REPAIR Left 2009  . CORONARY ANGIOPLASTY WITH STENT PLACEMENT    . PACEMAKER IMPLANT      Current Medications: Current Meds  Medication Sig  . aspirin 325 MG tablet Take 325 mg by mouth daily.  [name] aspirin 81 MG chewable tablet Chew 81 mg by mouth daily.  [name] atorvastatin (LIPITOR) 80 MG tablet Take 80 mg by mouth daily.  [name]  furosemide (LASIX) 20 MG tablet Take 20 mg by mouth 2 (two) times daily. Do not take if you weigh 214 or less  . HYDROcodone-acetaminophen (NORCO) 7.5-325 MG per tablet Take 1 tablet by mouth every 6 (six) hours as needed for moderate pain.  [name] lisinopril (PRINIVIL,ZESTRIL) 2.5 MG tablet Take 2.5 mg by mouth daily.  . Meth-Hyo-M Bl-Na Phos-Ph Sal (URIBEL) 118 MG CAPS Take by mouth 4 (four) times daily.  . metoprolol succinate (TOPROL-XL) 25  MG 24 hr tablet Take 1 tablet (25 mg total) by mouth 2 (two) times daily. Take an extra 12.5 mg mid day  . nitroGLYCERIN (NITROSTAT) 0.4 MG SL tablet Place 0.4 mg under the tongue.  . phenazopyridine (PYRIDIUM) 97 MG tablet Take 97 mg by mouth 3 (three) times daily as needed for pain.  [name] Phenazopyridine HCl 97.5 MG TABS Take by mouth daily as needed.  [name] spironolactone (ALDACTONE) 25 MG tablet Take 0.5 tablets (12.5 mg total) by mouth daily. (Patient taking differently: Take 25 mg by mouth daily. )  . ticagrelor (BRILINTA) 90 MG TABS tablet Take 90 mg by mouth 2 (two) times daily.     Allergies:   Clopidogrel; Tape; and Plavix [clopidogrel bisulfate]   Social History   Social History  . Marital status: Married    Spouse name: N/A  . Number of children: N/A  . Years of education: N/A   Social History Main Topics  . Smoking status: Former Smoker    Packs/day: 1.00    Years: 6.00    Types: Cigarettes  . Smokeless tobacco: Never Used     Comment: Quit 30 years ago  . Alcohol use Yes     Comment: Occasionally  . Drug use: No  . Sexual activity: Not on file   Other Topics Concern  . 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       Family History: The patient's family history includes Cancer in her father; Cerebral aneurysm in her paternal grandmother; Hypertension in her brother; Thyroid disease in her mother. ROS:   Please see the history of present illness.    All other systems reviewed and are negative.  EKGs/Labs/Other Studies Reviewed:    The following studies were reviewed today:   Device check: 07/31/2017 Patient ID: Allison White is a 56 y.o. female. Dual Chamber Defibrillator [employer] Following MD: Dr. Duaine Dredge: Reinaldo Meeker Referring Provider: Reinaldo Meeker Assessment:  NORMAL DEVICE FUNCTION NO SIGNIFICANT ARRHYTHMIAS 1. Acute on chronic systolic congestive heart  failure (CMS-HCC)  2. Decreased cardiac ejection fraction  3. Presence of automatic (implantable) cardiac defibrillator  Plan:  Continue Routine Remote Monitoring and Return to device clinic in 4 months .   Echo 07/02/17: Study Conclusions - Left ventricle: The cavity size was moderately dilated. Wall   thickness was normal. Systolic function was severely reduced. The   estimated ejection fraction was in the range of 20% to 25%.   Diffuse hypokinesis. Akinesis of the anteroseptal myocardium. - Mitral valve: There was moderate regurgitation. - Left atrium: The atrium was moderately dilated. - Tricuspid valve: There was mild-moderate regurgitation. - Pulmonary arteries: Systolic pressure was moderately increased.   PA peak pressure: 54 mm Hg (S). Recent Labs: 05/20/2017: BNP CANCELED 07/18/2017: BUN 21; Creatinine, Ser 0.96; Hemoglobin 14.8; Platelets 235; Potassium 3.9; Sodium 136  Recent Lipid Panel    Component Value Date/Time   CHOL (H) 06/11/2008 0825  227        ATP III CLASSIFICATION:  <200     mg/dL   Desirable  200-239  mg/dL   Borderline High  >=240    mg/dL   High   TRIG 113 06/11/2008 0825   HDL 56 06/11/2008 0825   CHOLHDL 4.1 06/11/2008 0825   VLDL 23 06/11/2008 0825   LDLCALC (H) 06/11/2008 0825    148        Total Cholesterol/HDL:CHD Risk Coronary Heart Disease Risk Table                     Men   Women  1/2 Average Risk   3.4   3.3    Physical Exam:    VS:  There were no vitals taken for this visit.    Wt Readings from Last 3 Encounters:  07/18/17 224 lb (101.6 kg)  05/20/17 218 lb 12.8 oz (99.2 kg)  01/31/15 215 lb 4 oz (97.6 kg)     GEN:  Well nourished, well developed in no acute distress HEENT: Normal NECK: No JVD; No carotid bruits LYMPHATICS: No lymphadenopathy CARDIAC: RRR, no murmurs, rubs, gallops RESPIRATORY:  Clear to auscultation without rales, wheezing or rhonchi  ABDOMEN: Soft, non-tender, non-distended MUSCULOSKELETAL:  No edema; No  deformity  SKIN: Warm and dry NEUROLOGIC:  Alert and oriented x 3 PSYCHIATRIC:  Normal affect    Signed, Shirlee More, MD  08/20/2017 9:42 AM    [place]

## 2017-08-20 NOTE — Patient Instructions (Signed)
Medication Instructions:  Your physician has recommended you make the following change in your medication:   Stop: Lisinopril  Start: Entresto 24/26mg  take one tablet twice daily     Labwork: None   Testing/Procedures: None  Follow-Up:  Your physician recommends that you schedule a follow-up appointment in: 1 months  Also, will be set up to see EP for consult with Dr Curt Bears  Any Other Special Instructions Will Be Listed Below (If Applicable).     If you need a refill on your cardiac medications before your next appointment, please call your pharmacy.

## 2017-08-25 ENCOUNTER — Telehealth: Payer: Self-pay | Admitting: Cardiology

## 2017-08-25 NOTE — Telephone Encounter (Signed)
Error/wouldn't take ext

## 2017-09-17 NOTE — Progress Notes (Signed)
Electrophysiology Office Note   Date:  09/18/2017   ID:  Allison White, DOB [birth year], MRN [id number]  PCP:  Lillard Anes, MD  Cardiologist:  Bettina Gavia Primary Electrophysiologist:  Yiannis Tulloch Meredith Leeds, MD    Chief Complaint  Patient presents with  . Defib Check    Chronic systolic CHF     History of Present Illness: Allison White is a 56 y.o. female who is being seen today for the evaluation of systolic heart failure at the request of Lillard Anes,*. Presenting today for electrophysiology evaluation.  She has a history of chronic systolic heart failure, coronary disease, hyperlipidemia.  She has a Biotronik ICD in place.  She had an anterior STEMI 11/19/16 and had drug-eluting stent placed to the LAD diagonal bifurcation.  Her ICD was implanted around that time.    Today, she denies symptoms of palpitations, chest pain, shortness of breath, orthopnea, PND, lower extremity edema, claudication, dizziness, presyncope, syncope, bleeding, or neurologic sequela. The patient is tolerating medications without difficulties.    Past Medical History:  Diagnosis Date  . Ejection fraction < 50%    23% as of August 2018  . Headache(784.0)   . HTN (hypertension)   . Hyperlipidemia   . MI (myocardial infarction) ([place])   . Nonruptured cerebral aneurysm, internal carotid artery    Left side, stent placement (2009)  . Vertigo    Past Surgical History:  Procedure Laterality Date  . ABDOMINAL HYSTERECTOMY    . CARDIAC DEFIBRILLATOR PLACEMENT    . CEREBRAL ANEURYSM REPAIR Left 2009  . CORONARY ANGIOPLASTY WITH STENT PLACEMENT    . PACEMAKER IMPLANT       Current Outpatient Prescriptions  Medication Sig Dispense Refill  . aspirin 81 MG chewable tablet Chew 81 mg by mouth daily.    [name] atorvastatin (LIPITOR) 80 MG tablet Take 80 mg by mouth daily.    . furosemide (LASIX) 20 MG tablet Take 20 mg by mouth 2 (two) times daily. Do not take if you weigh 214 or less    .  HYDROcodone-acetaminophen (NORCO) 7.5-325 MG per tablet Take 1 tablet by mouth every 6 (six) hours as needed for moderate pain.    . metoprolol succinate (TOPROL-XL) 25 MG 24 hr tablet Take 1 tablet (25 mg total) by mouth 2 (two) times daily. Take an extra 12.5 mg mid day 90 tablet 37  . nitroGLYCERIN (NITROSTAT) 0.4 MG SL tablet Place 0.4 mg under the tongue.    . phenazopyridine (PYRIDIUM) 97 MG tablet Take 97 mg by mouth 3 (three) times daily as needed for pain.    . sacubitril-valsartan (ENTRESTO) 24-26 MG Take 1 tablet by mouth 2 (two) times daily. Start Monday 08/25/16, last dose of lisinopril tomorrow morning 60 tablet 1  . spironolactone (ALDACTONE) 25 MG tablet Take 25 mg by mouth daily.    . ticagrelor (BRILINTA) 90 MG TABS tablet Take 90 mg by mouth 2 (two) times daily.     No current facility-administered medications for this visit.     Allergies:   Clopidogrel; Tape; and Plavix [clopidogrel bisulfate]   Social History:  The patient  reports that she has quit smoking. Her smoking use included Cigarettes. She has a 6.00 pack-year smoking history. She has never used smokeless tobacco. She reports that she drinks alcohol. She reports that she does not use drugs.   Family History:  The patient's family history includes Cancer in her father; Cerebral aneurysm in her paternal grandmother; Hypertension in her brother; Thyroid  disease in her mother.    ROS:  Please see the history of present illness.   Otherwise, review of systems is positive for fatigue, leg pain, palpitations, dyspnea on exertion, muscle pain, dizziness, easy bruising, headaches.   All other systems are reviewed and negative.    PHYSICAL EXAM: VS:  BP 118/82   Pulse 92   Ht 5\' 5"  (1.651 m)   Wt 223 lb 4.8 oz (101.3 kg)   BMI 37.16 kg/m  , BMI Body mass index is 37.16 kg/m. GEN: Well nourished, well developed, in no acute distress  HEENT: normal  Neck: no JVD, carotid bruits, or masses Cardiac: RRR; no murmurs,  rubs, or gallops,no edema  Respiratory:  clear to auscultation bilaterally, normal work of breathing GI: soft, nontender, nondistended, + BS MS: no deformity or atrophy  Skin: warm and dry,  device pocket is well healed Neuro:  Strength and sensation are intact Psych: euthymic mood, full affect  EKG:  EKG is not ordered today. Personal review of the ekg ordered 07/11/17 shows sinus rhythm, rate 101, LAFB, old anteroseptal infarct  Device interrogation is reviewed today in detail.  See PaceArt for details.   Recent Labs: 05/20/2017: BNP CANCELED 07/18/2017: BUN 21; Creatinine, Ser 0.96; Hemoglobin 14.8; Platelets 235; Potassium 3.9; Sodium 136    Lipid Panel     Component Value Date/Time   CHOL (H) 06/11/2008 0825    227        ATP III CLASSIFICATION:  <200     mg/dL   Desirable  200-239  mg/dL   Borderline High  >=240    mg/dL   High   TRIG 113 06/11/2008 0825   HDL 56 06/11/2008 0825   CHOLHDL 4.1 06/11/2008 0825   VLDL 23 06/11/2008 0825   LDLCALC (H) 06/11/2008 0825    148        Total Cholesterol/HDL:CHD Risk Coronary Heart Disease Risk Table                     Men   Women  1/2 Average Risk   3.4   3.3     Wt Readings from Last 3 Encounters:  09/18/17 223 lb 4.8 oz (101.3 kg)  08/20/17 220 lb 1.3 oz (99.8 kg)  07/18/17 224 lb (101.6 kg)      Other studies Reviewed: Additional studies/ records that were reviewed today include: TTE 11/19/16  Review of the above records today demonstrates:  Ejection fraction is visually estimated at 50% The apical wall and anteroseptal is akinetic   ASSESSMENT AND PLAN:  1.  Chronic systolic heart failure due to  ischemic cardiomyopathy: Does not appear to be volume overloaded.  Does have a Biotronik ICD which is functioning appropriately.  Continue with current management  2.  Hypertension: Blood pressure well controlled today.  No changes.  3.  Coronary artery disease: Currently no chest pain.  Continue current  management  4.  Hyperlipidemia: Continue statin  Current medicines are reviewed at length with the patient today.   The patient does not have concerns regarding her medicines.  The following changes were made today:  none  Labs/ tests ordered today include:  No orders of the defined types were placed in this encounter.    Disposition:   FU with [facility] [place] 1 year  Signed, Timia Casselman Meredith Leeds, MD  09/18/2017 10:15 AM     [facility] [address] [place] [address] [phone] (office) [phone] (fax)

## 2017-09-18 ENCOUNTER — Ambulatory Visit (INDEPENDENT_AMBULATORY_CARE_PROVIDER_SITE_OTHER): Payer: BLUE CROSS/BLUE SHIELD | Admitting: Cardiology

## 2017-09-18 ENCOUNTER — Encounter: Payer: Self-pay | Admitting: Cardiology

## 2017-09-18 VITALS — BP 118/82 | HR 92 | Ht 65.0 in | Wt 223.3 lb

## 2017-09-18 DIAGNOSIS — I5022 Chronic systolic (congestive) heart failure: Secondary | ICD-10-CM | POA: Diagnosis not present

## 2017-09-18 DIAGNOSIS — I25118 Atherosclerotic heart disease of native coronary artery with other forms of angina pectoris: Secondary | ICD-10-CM

## 2017-09-18 DIAGNOSIS — Z4502 Encounter for adjustment and management of automatic implantable cardiac defibrillator: Secondary | ICD-10-CM

## 2017-09-18 DIAGNOSIS — E785 Hyperlipidemia, unspecified: Secondary | ICD-10-CM | POA: Diagnosis not present

## 2017-09-18 DIAGNOSIS — I10 Essential (primary) hypertension: Secondary | ICD-10-CM | POA: Diagnosis not present

## 2017-09-18 LAB — CUP PACEART INCLINIC DEVICE CHECK
Battery Voltage: 3.11 V
Brady Statistic RA Percent Paced: 0 %
HIGH POWER IMPEDANCE MEASURED VALUE: 91 Ohm
Implantable Lead Location: 753860
Implantable Lead Model: 377
Implantable Lead Model: 402266
Implantable Lead Serial Number: 49838890
Implantable Pulse Generator Implant Date: 20180410
Lead Channel Impedance Value: 580 Ohm
Lead Channel Pacing Threshold Pulse Width: 0.4 ms
Lead Channel Sensing Intrinsic Amplitude: 2.5 mV
Lead Channel Sensing Intrinsic Amplitude: 24.2 mV
Lead Channel Setting Pacing Pulse Width: 0.4 ms
MDC IDC LEAD IMPLANT DT: 20180410
MDC IDC LEAD IMPLANT DT: 20180410
MDC IDC LEAD LOCATION: 753859
MDC IDC LEAD SERIAL: 49794726
MDC IDC MSMT LEADCHNL RA IMPEDANCE VALUE: 725 Ohm
MDC IDC MSMT LEADCHNL RA PACING THRESHOLD AMPLITUDE: 0.7 V
MDC IDC MSMT LEADCHNL RV PACING THRESHOLD AMPLITUDE: 0.6 V
MDC IDC MSMT LEADCHNL RV PACING THRESHOLD PULSEWIDTH: 0.4 ms
MDC IDC PG SERIAL: 60982098
MDC IDC SESS DTM: 20181101103413
MDC IDC SET LEADCHNL RA PACING AMPLITUDE: 2 V
MDC IDC SET LEADCHNL RV PACING AMPLITUDE: 2.5 V
MDC IDC SET LEADCHNL RV SENSING SENSITIVITY: 0.8 mV
MDC IDC STAT BRADY RV PERCENT PACED: 0 %

## 2017-09-18 NOTE — Patient Instructions (Signed)
Medication Instructions:  Your physician recommends that you continue on your current medications as directed. Please refer to the Current Medication list given to you today.  --- If you need a refill on your cardiac medications before your next appointment, please call your pharmacy. ---  Labwork: None ordered  Testing/Procedures: None ordered  Follow-Up: Remote monitoring is used to monitor your Pacemaker or ICD from home. This monitoring reduces the number of office visits required to check your device to one time per year. It allows [place] to keep an eye on the functioning of your device to ensure it is working properly. You are scheduled for a device check from home on 12/18/2017. You may send your transmission at any time that day. If you have a wireless device, the transmission will be sent automatically. After your physician reviews your transmission, you will receive a postcard with your next transmission date.  Your physician wants you to follow-up in: 1 year with Dr. Curt Bears.  You will receive a reminder letter in the mail two months in advance. If you don't receive a letter, please call our office to schedule the follow-up appointment.   Thank you for choosing [facility]!!   Trinidad Curet, RN [phone]

## 2017-09-19 NOTE — Progress Notes (Signed)
Cardiology Office Note:    Date:  09/22/2017   ID:  Allison White, DOB 11-Aug-1961, MRN [id number]  PCP:  Lillard Anes, MD  Cardiologist:  Shirlee More, MD    Referring MD: Lillard Anes,*    ASSESSMENT:    1. Chronic systolic congestive heart failure ([place])   2. Coronary artery disease involving native coronary artery of native heart with angina pectoris ([place])   3. Myalgia   4. Other hyperlipidemia    PLAN:    In order of problems listed above:  1. Stable, continue her current medical regimen including loop diuretic MRA beta-blocker and ARNI.  We will plan on rechecking echocardiogram mid February. 2. Stable plan to transition from Brilinta to clopidogrel after 11/19/2017 3. On a high intensity statin reduced dose 50% if not improved will need alternate treatment with rosuvastatin 4. Continue high intensity statin   Next appointment: 2 months   Medication Adjustments/Labs and Tests Ordered: Current medicines are reviewed at length with the patient today.  Concerns regarding medicines are outlined above.  Orders Placed This Encounter  Procedures  . Basic Metabolic Panel (BMET)   Meds ordered this encounter  Medications  . atorvastatin (LIPITOR) 80 MG tablet    Sig: Take 0.5 tablets (40 mg total) daily by mouth.    Dispense:  15 tablet    Refill:  21    Chief Complaint  Patient presents with  . Follow-up  . Congestive Heart Failure  . Coronary Artery Disease    History of Present Illness:    Allison White is a 56 y.o. female with a hx of Anterior-LAD STEMI 11/19/16 and Xience drug-eluting stent to the proximal LAD with angioplasty to the LAD-diagonal bifurcation , CAD,systolicCHF poorly tolerant of ACEI with hypotension , HTN with severelly reduced EF 23%  and ICD palcement 02/25/17 last seen 08/20/17.. Compliance with diet, lifestyle and medications: Yes Clinically she is doing well she remains active at home is able to walk 10-15  minutes/day her ejection fraction remains severely reduced and she has exercise intolerance for more vigorous activities.  She is not having shortness of breath chest pain palpitation or syncope and recent device check showed no tachyarrhythmia.  Weight is stable at home home blood pressure runs 110-115/80. Past Medical History:  Diagnosis Date  . Ejection fraction < 50%    23% as of August 2018  . Headache(784.0)   . HTN (hypertension)   . Hyperlipidemia   . MI (myocardial infarction) ([place])   . Nonruptured cerebral aneurysm, internal carotid artery    Left side, stent placement (2009)  . Vertigo     Past Surgical History:  Procedure Laterality Date  . ABDOMINAL HYSTERECTOMY    . CARDIAC DEFIBRILLATOR PLACEMENT    . CEREBRAL ANEURYSM REPAIR Left 2009  . CORONARY ANGIOPLASTY WITH STENT PLACEMENT    . PACEMAKER IMPLANT      Current Medications: Current Meds  Medication Sig  . aspirin 81 MG chewable tablet Chew 81 mg by mouth daily.  [name] atorvastatin (LIPITOR) 80 MG tablet Take 0.5 tablets (40 mg total) daily by mouth.  . furosemide (LASIX) 20 MG tablet Take 20 mg by mouth 2 (two) times daily. Do not take if you weigh 214 or less  . HYDROcodone-acetaminophen (NORCO) 7.5-325 MG per tablet Take 1 tablet by mouth every 6 (six) hours as needed for moderate pain.  . metoprolol succinate (TOPROL-XL) 25 MG 24 hr tablet Take 1 tablet (25 mg total) by mouth 2 (two) times  daily. Take an extra 12.5 mg mid day  . nitroGLYCERIN (NITROSTAT) 0.4 MG SL tablet Place 0.4 mg under the tongue.  . phenazopyridine (PYRIDIUM) 97 MG tablet Take 97 mg by mouth 3 (three) times daily as needed for pain.  . sacubitril-valsartan (ENTRESTO) 24-26 MG Take 1 tablet by mouth 2 (two) times daily. Start Monday 08/25/16, last dose of lisinopril tomorrow morning  . spironolactone (ALDACTONE) 25 MG tablet Take 25 mg by mouth daily.  . ticagrelor (BRILINTA) 90 MG TABS tablet Take 90 mg by mouth 2 (two) times daily.  .  [DISCONTINUED] atorvastatin (LIPITOR) 80 MG tablet Take 80 mg by mouth daily.     Allergies:   Clopidogrel; Tape; and Plavix [clopidogrel bisulfate]   Social History   Socioeconomic History  . Marital status: Married    Spouse name: None  . Number of children: None  . Years of education: None  . Highest education level: None  Social Needs  . Financial resource strain: None  . Food insecurity - worry: None  . Food insecurity - inability: None  . Transportation needs - medical: None  . Transportation needs - non-medical: None  Occupational History  . None  Tobacco Use  . Smoking status: Former Smoker    Packs/day: 1.00    Years: 6.00    Pack years: 6.00    Types: Cigarettes  . Smokeless tobacco: Never Used  . Tobacco comment: Quit 30 years ago  Substance and Sexual Activity  . Alcohol use: Yes    Comment: Occasionally  . Drug use: No  . Sexual activity: None  Other Topics Concern  . 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       Family History: The patient's family history includes Cancer in her father; Cerebral aneurysm in her paternal grandmother; Hypertension in her brother; Thyroid disease in her mother. ROS:   Please see the history of present illness.    All other systems reviewed and are negative.  EKGs/Labs/Other Studies Reviewed:    The following studies were reviewed today  Recent Labs: 05/20/2017: BNP CANCELED 07/18/2017: BUN 21; Creatinine, Ser 0.96; Hemoglobin 14.8; Platelets 235; Potassium 3.9; Sodium 136  Recent Lipid Panel    Component Value Date/Time   CHOL (H) 06/11/2008 0825    227        ATP III CLASSIFICATION:  <200     mg/dL   Desirable  200-239  mg/dL   Borderline High  >=240    mg/dL   High   TRIG 113 06/11/2008 0825   HDL 56 06/11/2008 0825   CHOLHDL 4.1 06/11/2008 0825   VLDL 23 06/11/2008 0825   LDLCALC (H) 06/11/2008 0825    148        Total  Cholesterol/HDL:CHD Risk Coronary Heart Disease Risk Table                     Men   Women  1/2 Average Risk   3.4   3.3    Physical Exam:    VS:  BP 90/60   Pulse 100   Ht 5\' 5"  (1.651 m)   Wt 225 lb (102.1 kg)   SpO2 98%   BMI 37.44 kg/m     Wt Readings from Last 3 Encounters:  09/22/17 225 lb (102.1 kg)  09/18/17 223 lb 4.8 oz (101.3 kg)  08/20/17 220 lb 1.3 oz (99.8 kg)  GEN:  Well nourished, well developed in no acute distress HEENT: Normal NECK: No JVD; No carotid bruits LYMPHATICS: No lymphadenopathy CARDIAC: RRR, no murmurs, rubs, gallops RESPIRATORY:  Clear to auscultation without rales, wheezing or rhonchi  ABDOMEN: Soft, non-tender, non-distended MUSCULOSKELETAL:  No edema; No deformity  SKIN: Warm and dry NEUROLOGIC:  Alert and oriented x 3 PSYCHIATRIC:  Normal affect    Signed, Shirlee More, MD  09/22/2017 11:31 AM    [place]

## 2017-09-22 ENCOUNTER — Other Ambulatory Visit: Payer: Self-pay | Admitting: Cardiology

## 2017-09-22 ENCOUNTER — Encounter: Payer: Self-pay | Admitting: Cardiology

## 2017-09-22 ENCOUNTER — Ambulatory Visit (INDEPENDENT_AMBULATORY_CARE_PROVIDER_SITE_OTHER): Payer: BLUE CROSS/BLUE SHIELD | Admitting: Cardiology

## 2017-09-22 VITALS — BP 90/60 | HR 100 | Ht 65.0 in | Wt 225.0 lb

## 2017-09-22 DIAGNOSIS — E7849 Other hyperlipidemia: Secondary | ICD-10-CM

## 2017-09-22 DIAGNOSIS — M791 Myalgia, unspecified site: Secondary | ICD-10-CM | POA: Diagnosis not present

## 2017-09-22 DIAGNOSIS — I5022 Chronic systolic (congestive) heart failure: Secondary | ICD-10-CM

## 2017-09-22 DIAGNOSIS — I25119 Atherosclerotic heart disease of native coronary artery with unspecified angina pectoris: Secondary | ICD-10-CM | POA: Diagnosis not present

## 2017-09-22 MED ORDER — ATORVASTATIN CALCIUM 80 MG PO TABS: 40.0000 mg | ORAL_TABLET | Freq: Every day | ORAL | 37 refills | Status: DC

## 2017-09-22 NOTE — Patient Instructions (Addendum)
Medication Instructions:  Your physician recommends that you continue on your current medications as directed. Please refer to the Current Medication list given to you today.  Labwork: Your physician recommends that you return for lab work in: today. BMP.  Testing/Procedures: None  Follow-Up: Your physician wants you to follow-up in: 2 months. You will receive a reminder letter in the mail two months in advance. If you don't receive a letter, please call our office to schedule the follow-up appointment.  Any Other Special Instructions Will Be Listed Below (If Applicable).     If you need a refill on your cardiac medications before your next appointment, please call your pharmacy.    1. Avoid all over-the-counter antihistamines except Claritin/Loratadine and Zyrtec/Cetrizine. 2. Avoid all combination including cold sinus allergies flu decongestant and sleep medications 3. You can use Robitussin DM Mucinex and Mucinex DM for cough. 4. can use Tylenol aspirin ibuprofen and naproxen but no combinations such as sleep or sinus.

## 2017-09-23 LAB — BASIC METABOLIC PANEL
BUN/Creatinine Ratio: 23 (ref 9–23)
BUN: 19 mg/dL (ref 6–24)
CALCIUM: 9.7 mg/dL (ref 8.7–10.2)
CHLORIDE: 103 mmol/L (ref 96–106)
CO2: 23 mmol/L (ref 20–29)
Creatinine, Ser: 0.83 mg/dL (ref 0.57–1.00)
GFR calc Af Amer: 92 mL/min/{1.73_m2} (ref >59)
GFR calc non Af Amer: 80 mL/min/{1.73_m2} (ref >59)
GLUCOSE: 88 mg/dL (ref 65–99)
Potassium: 4.4 mmol/L (ref 3.5–5.2)
Sodium: 140 mmol/L (ref 134–144)

## 2017-11-06 ENCOUNTER — Other Ambulatory Visit: Payer: Self-pay | Admitting: Cardiology

## 2017-11-07 ENCOUNTER — Telehealth: Payer: Self-pay | Admitting: Cardiology

## 2017-11-07 DIAGNOSIS — I5022 Chronic systolic (congestive) heart failure: Secondary | ICD-10-CM | POA: Diagnosis not present

## 2017-11-07 DIAGNOSIS — E782 Mixed hyperlipidemia: Secondary | ICD-10-CM | POA: Diagnosis not present

## 2017-11-07 DIAGNOSIS — I773 Arterial fibromuscular dysplasia: Secondary | ICD-10-CM | POA: Diagnosis not present

## 2017-11-07 DIAGNOSIS — I10 Essential (primary) hypertension: Secondary | ICD-10-CM | POA: Diagnosis not present

## 2017-11-07 NOTE — Telephone Encounter (Signed)
Refill was sent yesterday with confirm receipt from pharmacy.

## 2017-11-07 NOTE — Telephone Encounter (Signed)
°*  STAT* If patient is at the pharmacy, call can be transferred to refill team.   1. Which medications need to be refilled? (please list name of each medication and dose if known) Entresto takes twice daily   2. Which pharmacy/location (including street and city if local pharmacy) is medication to be sent to?[facility] In  [place]   3. Do they need a 30 day or 90 day supply? 3 mo

## 2017-11-28 ENCOUNTER — Ambulatory Visit (INDEPENDENT_AMBULATORY_CARE_PROVIDER_SITE_OTHER): Payer: BLUE CROSS/BLUE SHIELD | Admitting: Cardiology

## 2017-11-28 ENCOUNTER — Encounter: Payer: Self-pay | Admitting: Cardiology

## 2017-11-28 VITALS — HR 99 | Ht 65.0 in | Wt 229.0 lb

## 2017-11-28 DIAGNOSIS — I5022 Chronic systolic (congestive) heart failure: Secondary | ICD-10-CM | POA: Diagnosis not present

## 2017-11-28 MED ORDER — FUROSEMIDE 20 MG PO TABS: 20.0000 mg | ORAL_TABLET | Freq: Three times a day (TID) | ORAL | 3 refills | Status: DC

## 2017-11-28 NOTE — Patient Instructions (Signed)
Medication Instructions:  Your physician has recommended you make the following change in your medication:  STOP atorvastatin  INCREASE furosemide to three times daily  Labwork: None  Testing/Procedures: None  Follow-Up: Your physician recommends that you schedule a follow-up appointment in: 2 months.  Any Other Special Instructions Will Be Listed Below (If Applicable).     If you need a refill on your cardiac medications before your next appointment, please call your pharmacy.

## 2017-11-28 NOTE — Progress Notes (Signed)
Cardiology Office Note:    Date:  11/28/2017   ID:  Allison White, DOB 05-Nov-1961, MRN [id number]  PCP:  Lillard Anes, MD  Cardiologist:  Shirlee More, MD    Referring MD: Lillard Anes,*    ASSESSMENT:    1. Chronic systolic congestive heart failure (HCC)    PLAN:    In order of problems listed above:  1. Unfortunately her weight is up in the range of 10 pounds her heart failure is decompensated although she is not short of breath and she will increase her diuretic 50% and she returns to baseline.  Despite a low blood pressure she tolerates Entresto along with spironolactone and beta-blocker and will continue the same.  I will see her back in the office in 2 months.  Records requested including labs from her PCP and when she runs out of her Brilinta she will transition to clopidogrel.   Next appointment: 2 months   Medication Adjustments/Labs and Tests Ordered: Current medicines are reviewed at length with the patient today.  Concerns regarding medicines are outlined above.  No orders of the defined types were placed in this encounter.  No orders of the defined types were placed in this encounter.   Chief Complaint  Patient presents with  . Follow-up    History of Present Illness:    Allison White is a 57 y.o. female with a hx of Anterior-LAD STEMI 11/19/16 and Xience drug-eluting stent to the proximal LAD with angioplasty to the LAD-diagonal bifurcation , CAD,systolicCHF poorly tolerant of ACEI with hypotension , HTN with severelly reduced EF 23%  and ICD palcement 02/25/17 last seen 08/20/17.[name] last seen in November 2018. Compliance with diet, lifestyle and medications: Yes Over the holidays her weights are up to 10 pounds she is not particularly short of breath which she has peripheral edema.  She is not having anginal discomfort orthopnea PND.  Her home blood pressure runs in the range of 948 systolic and she tolerates diuretic beta-blocker and  ARNI. Past Medical History:  Diagnosis Date  . Ejection fraction < 50%    23% as of August 2018  . Headache(784.0)   . HTN (hypertension)   . Hyperlipidemia   . MI (myocardial infarction) ([place])   . Nonruptured cerebral aneurysm, internal carotid artery    Left side, stent placement (2009)  . Vertigo     Past Surgical History:  Procedure Laterality Date  . ABDOMINAL HYSTERECTOMY    . CARDIAC DEFIBRILLATOR PLACEMENT    . CEREBRAL ANEURYSM REPAIR Left 2009  . CORONARY ANGIOPLASTY WITH STENT PLACEMENT    . PACEMAKER IMPLANT      Current Medications: Current Meds  Medication Sig  . aspirin 81 MG chewable tablet Chew 81 mg by mouth daily.  [name] atorvastatin (LIPITOR) 80 MG tablet Take 0.5 tablets (40 mg total) daily by mouth.  . ENTRESTO 24-26 MG TAKE 1 TABLET BY MOUTH TWICE DAILY. START  MONDAY  08/25/2017 . TAKE  LAST  DOSE  OF  LISINOPRIL  TOMORROW  MORNING.  . furosemide (LASIX) 20 MG tablet Take 20 mg by mouth 2 (two) times daily. Do not take if you weigh 214 or less  . HYDROcodone-acetaminophen (NORCO) 7.5-325 MG per tablet Take 1 tablet by mouth every 6 (six) hours as needed for moderate pain.  . metoprolol succinate (TOPROL-XL) 25 MG 24 hr tablet Take 1 tablet (25 mg total) by mouth 2 (two) times daily. Take an extra 12.5 mg mid day  . phenazopyridine (PYRIDIUM)  97 MG tablet Take 97 mg by mouth 3 (three) times daily as needed for pain.  [name] spironolactone (ALDACTONE) 25 MG tablet Take 25 mg by mouth daily.  . ticagrelor (BRILINTA) 90 MG TABS tablet Take 90 mg by mouth 2 (two) times daily.     Allergies:   Clopidogrel; Tape; and Plavix [clopidogrel bisulfate]   Social History   Socioeconomic History  . Marital status: Married    Spouse name: None  . Number of children: None  . Years of education: None  . Highest education level: None  Social Needs  . Financial resource strain: None  . Food insecurity - worry: None  . Food insecurity - inability: None  . Transportation  needs - medical: None  . Transportation needs - non-medical: None  Occupational History  . None  Tobacco Use  . Smoking status: Former Smoker    Packs/day: 1.00    Years: 6.00    Pack years: 6.00    Types: Cigarettes  . Smokeless tobacco: Never Used  . Tobacco comment: Quit 30 years ago  Substance and Sexual Activity  . Alcohol use: Yes    Comment: Occasionally  . Drug use: No  . Sexual activity: None  Other Topics Concern  . 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       Family History: The patient's family history includes Cancer in her father; Cerebral aneurysm in her paternal grandmother; Hypertension in her brother; Thyroid disease in her mother. ROS:   Please see the history of present illness.    All other systems reviewed and are negative.  EKGs/Labs/Other Studies Reviewed:    The following studies were reviewed today:  Recent Labs: 05/20/2017: BNP CANCELED 07/18/2017: Hemoglobin 14.8; Platelets 235 09/22/2017: BUN 19; Creatinine, Ser 0.83; Potassium 4.4; Sodium 140  Recent Lipid Panel    Component Value Date/Time   CHOL (H) 06/11/2008 0825    227        ATP III CLASSIFICATION:  <200     mg/dL   Desirable  200-239  mg/dL   Borderline High  >=240    mg/dL   High   TRIG 113 06/11/2008 0825   HDL 56 06/11/2008 0825   CHOLHDL 4.1 06/11/2008 0825   VLDL 23 06/11/2008 0825   LDLCALC (H) 06/11/2008 0825    148        Total Cholesterol/HDL:CHD Risk Coronary Heart Disease Risk Table                     Men   Women  1/2 Average Risk   3.4   3.3    Physical Exam:    VS:  Pulse 99   Ht 5\' 5"  (1.651 m)   Wt 229 lb (103.9 kg)   SpO2 99%   BMI 38.11 kg/m     Wt Readings from Last 3 Encounters:  11/28/17 229 lb (103.9 kg)  09/22/17 225 lb (102.1 kg)  09/18/17 223 lb 4.8 oz (101.3 kg)     GEN:  Well nourished, well developed in no acute distress HEENT: Normal NECK: No JVD; No  carotid bruits LYMPHATICS: No lymphadenopathy CARDIAC: RRR, no murmurs, rubs, gallops RESPIRATORY:  Clear to auscultation without rales, wheezing or rhonchi  ABDOMEN: Soft, non-tender, non-distended MUSCULOSKELETAL:  2+ bilateral to the knee edema; No deformity  SKIN: Warm and dry NEUROLOGIC:  Alert and oriented x 3 PSYCHIATRIC:  Normal affect  Signed, Shirlee More, MD  11/28/2017 4:14 PM    [facility]

## 2017-12-11 ENCOUNTER — Telehealth: Payer: Self-pay | Admitting: Cardiology

## 2017-12-11 DIAGNOSIS — I5022 Chronic systolic (congestive) heart failure: Secondary | ICD-10-CM

## 2017-12-11 MED ORDER — METOPROLOL SUCCINATE ER 25 MG PO TB24: 25.0000 mg | ORAL_TABLET | Freq: Two times a day (BID) | ORAL | 3 refills | Status: DC

## 2017-12-11 MED ORDER — ATORVASTATIN CALCIUM 20 MG PO TABS: 20.0000 mg | ORAL_TABLET | Freq: Every day | ORAL | 3 refills | Status: DC

## 2017-12-11 MED ORDER — FUROSEMIDE 20 MG PO TABS: 20.0000 mg | ORAL_TABLET | Freq: Three times a day (TID) | ORAL | 3 refills | Status: DC

## 2017-12-11 MED ORDER — SPIRONOLACTONE 25 MG PO TABS: 25.0000 mg | ORAL_TABLET | Freq: Every day | ORAL | 3 refills | Status: DC

## 2017-12-11 MED ORDER — TICAGRELOR 90 MG PO TABS: 90.0000 mg | ORAL_TABLET | Freq: Two times a day (BID) | ORAL | 3 refills | Status: DC

## 2017-12-11 MED ORDER — SACUBITRIL-VALSARTAN 24-26 MG PO TABS: 1.0000 | ORAL_TABLET | Freq: Two times a day (BID) | ORAL | 3 refills | Status: DC

## 2017-12-11 NOTE — Addendum Note (Signed)
Addended by: Warner Mccreedy E on: 12/11/2017 01:34 PM   Modules accepted: Orders

## 2017-12-11 NOTE — Telephone Encounter (Signed)
1. Patient advised to resume Lipitor at 20 mg. Patient verbalized understanding.  2. Patient is allergic to Plavix. Reviewed with Dr. Bettina Gavia. Advised to stay on Brilinta.  3. Advised 90 day supply with refills will be sent. Patient verbalized understanding, no further questions.

## 2017-12-11 NOTE — Telephone Encounter (Signed)
Pain in shoulders is still not going away after being off the Lipitor for 2 weeks.  Patient would like to get a 3 month supply on all her meds due to insurance change. She is almost done with Brilinta.  Please call patient.

## 2017-12-11 NOTE — Telephone Encounter (Signed)
1. Resume lipitor,but  reduce by 50%  2. DC Brillinta when out, replace with clopidogrel 75 mg daily  3. OK to renew meds as 90 days

## 2017-12-11 NOTE — Telephone Encounter (Signed)
1. Patient states that the pain between her shoulder blades has not gotten any better since stopping Lipitor for 2 weeks. Please advise.  2. Patient states she only has about 2 weeks left of Brilinta and that you had mentioned something to her about switching when she ran out. Please advise.  3. 90 day supply for all other cardiac medications sent to [facility] in [place] per patient request.

## 2017-12-18 ENCOUNTER — Ambulatory Visit (INDEPENDENT_AMBULATORY_CARE_PROVIDER_SITE_OTHER): Payer: BLUE CROSS/BLUE SHIELD | Admitting: *Deleted

## 2017-12-18 DIAGNOSIS — I5022 Chronic systolic (congestive) heart failure: Secondary | ICD-10-CM | POA: Diagnosis not present

## 2017-12-18 NOTE — Progress Notes (Signed)
Remote ICD transmission.   

## 2017-12-19 ENCOUNTER — Encounter: Payer: Self-pay | Admitting: Cardiology

## 2018-01-01 ENCOUNTER — Telehealth: Payer: Self-pay | Admitting: Cardiology

## 2018-01-01 NOTE — Telephone Encounter (Signed)
Patient states her BP is going down again.

## 2018-01-01 NOTE — Telephone Encounter (Signed)
Patient's weight has been stable. Patient advised to hold spironolactone until Monday and to cut Entresto back to once daily. Patient advised to return call Monday with blood pressures. Patient verbalized understanding. No further questions.

## 2018-01-01 NOTE — Telephone Encounter (Signed)
1. Weight stable?  Hold spironolactone until Monday and call back  Cit Entresto to once a day

## 2018-01-01 NOTE — Telephone Encounter (Signed)
Patient states her blood pressure has been 90s/50s for the past 2 days. Patient also states that she has felt really tired. Heart rate is 80s. Please advise.

## 2018-01-05 LAB — CUP PACEART REMOTE DEVICE CHECK
Implantable Lead Implant Date: 20180410
Implantable Lead Implant Date: 20180410
Implantable Lead Location: 753859
Implantable Lead Model: 377
Implantable Lead Serial Number: 49838890
MDC IDC LEAD LOCATION: 753860
MDC IDC LEAD SERIAL: 49794726
MDC IDC PG IMPLANT DT: 20180410
MDC IDC PG SERIAL: 60982098
MDC IDC SESS DTM: 20190218154632

## 2018-01-12 ENCOUNTER — Telehealth: Payer: Self-pay | Admitting: Cardiology

## 2018-01-12 NOTE — Telephone Encounter (Signed)
Has gained 6 pounds in 5 days

## 2018-01-12 NOTE — Telephone Encounter (Signed)
Patient states she has gained 6 pounds in 5 days. Patient is in not acute distress, does not feel her shortness of breath is any more than normal. Patient has been holding spironolactone and taking Entresto only once daily as advised by Dr. Bettina Gavia 01/01/18. Please advise.

## 2018-01-12 NOTE — Telephone Encounter (Signed)
BP ? Take 40 mg furosemide until at baseline See me office this week

## 2018-01-12 NOTE — Telephone Encounter (Signed)
Patient's blood pressure has been 992'E-268'T systolic. Advised patient to take an extra tablet of furosemide in the evening tonight and tomorrow night. Appointment made for 01/14/18 at 9:20 am. Patient verbalized understanding. No further questions.

## 2018-01-12 NOTE — Telephone Encounter (Signed)
Left message with daughter to return call.

## 2018-01-13 NOTE — Progress Notes (Signed)
Cardiology Office Note:    Date:  01/14/2018   ID:  Allison White, DOB 08/06/1961, MRN [id number]  PCP:  Lillard Anes, MD  Cardiologist:  Shirlee More, MD    Referring MD: Lillard Anes,*    ASSESSMENT:    1. Chronic systolic congestive heart failure ([place])   2. Hypertensive heart disease with heart failure ([place])    PLAN:    In order of problems listed above:  1. Concerned she is developed diuretic tolerance was switched to torsemide check renal function proBNP Monday and if unimproved require parenteral diuretic 2. Stable I will have her increase her ARNI to twice a day and if renal function stable Monday resume her Spironolactone   Next appointment: One week   Medication Adjustments/Labs and Tests Ordered: Current medicines are reviewed at length with the patient today.  Concerns regarding medicines are outlined above.  No orders of the defined types were placed in this encounter.  No orders of the defined types were placed in this encounter.   Chief Complaint  Patient presents with  . Follow-up    to discuss weight gain with some SHOB   . Congestive Heart Failure    decompensated    History of Present Illness:    Allison White is a 57 y.o. female with a hx of Anterior-LAD STEMI 11/19/16 and Xience drug-eluting stent to the proximal LAD with angioplasty to the LAD-diagonal bifurcation , CAD,systolicCHF poorly tolerant of ACEI with hypotension , HTN with severelly reduced EF 23%  and ICD  last seen 11/28/17.She phoned the office initially with hypotension with a decrease in diuretic and ARNI then with weight gain and advised to increase her oral furosemide to 80 mg a dayand office follow-up  11/28/17: ASSESSMENT:    1. Chronic systolic congestive heart failure ([place])    PLAN:    1. Unfortunately her weight is up in the range of 10 pounds her heart failure is decompensated although she is not short of breath and she will increase her diuretic  50% and she returns to baseline.  Despite a low blood pressure she tolerates Entresto along with spironolactone and beta-blocker and will continue the same.  I will see her back in the office in 2 months.  Records requested including labs from her PCP and when she runs out of her Brilinta she will transition to clopidogrel.  Compliance with diet, lifestyle and medications: Yes Unfortunately weight is up in total about 10-12 pound pounds has edema shortness of breath and orthopnea.  She has not responded to furosemide she is diuretic resistant was switched to torsemide if unresponsive will require parenteral diuretic.  Past Medical History:  Diagnosis Date  . Ejection fraction < 50%    23% as of August 2018  . Headache(784.0)   . HTN (hypertension)   . Hyperlipidemia   . MI (myocardial infarction) ([place])   . Nonruptured cerebral aneurysm, internal carotid artery    Left side, stent placement (2009)  . Vertigo     Past Surgical History:  Procedure Laterality Date  . ABDOMINAL HYSTERECTOMY    . CARDIAC DEFIBRILLATOR PLACEMENT    . CEREBRAL ANEURYSM REPAIR Left 2009  . CORONARY ANGIOPLASTY WITH STENT PLACEMENT    . PACEMAKER IMPLANT      Current Medications: Current Meds  Medication Sig  . aspirin 81 MG chewable tablet Chew 81 mg by mouth daily.  [name] atorvastatin (LIPITOR) 20 MG tablet Take 1 tablet (20 mg total) by mouth daily.  [name]  furosemide (LASIX) 20 MG tablet Take 20 mg by mouth 4 (four) times daily.  [name] HYDROcodone-acetaminophen (NORCO) 7.5-325 MG per tablet Take 1 tablet by mouth every 6 (six) hours as needed for moderate pain.  . metoprolol succinate (TOPROL-XL) 25 MG 24 hr tablet Take 1 tablet (25 mg total) by mouth 2 (two) times daily. Take an extra 12.5 mg mid day  . nitroGLYCERIN (NITROSTAT) 0.4 MG SL tablet Place 0.4 mg under the tongue.  . phenazopyridine (PYRIDIUM) 97 MG tablet Take 97 mg by mouth 3 (three) times daily as needed for pain.  . sacubitril-valsartan (ENTRESTO)  24-26 MG Take 1 tablet by mouth daily.  . ticagrelor (BRILINTA) 90 MG TABS tablet Take 1 tablet (90 mg total) by mouth 2 (two) times daily.     Allergies:   Clopidogrel; Tape; and Plavix [clopidogrel bisulfate]   Social History   Socioeconomic History  . Marital status: Married    Spouse name: Not on file  . Number of children: Not on file  . Years of education: Not on file  . Highest education level: Not on file  Social Needs  . Financial resource strain: Not on file  . Food insecurity - worry: Not on file  . Food insecurity - inability: Not on file  . Transportation needs - medical: Not on file  . Transportation needs - non-medical: Not on file  Occupational History  . Not on file  Tobacco Use  . Smoking status: Former Smoker    Packs/day: 1.00    Years: 6.00    Pack years: 6.00    Types: Cigarettes  . Smokeless tobacco: Never Used  . Tobacco comment: Quit 30 years ago  Substance and Sexual Activity  . Alcohol use: Yes    Comment: Occasionally  . Drug use: No  . Sexual activity: Not on file  Other Topics Concern  . 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       Family History: The patient's family history includes Cancer in her father; Cerebral aneurysm in her paternal grandmother; Hypertension in her brother; Thyroid disease in her mother. ROS:   Please see the history of present illness.    All other systems reviewed and are negative.  EKGs/Labs/Other Studies Reviewed:    The following studies were reviewed today  Recent Labs: 05/20/2017: BNP CANCELED 07/18/2017: Hemoglobin 14.8; Platelets 235 09/22/2017: BUN 19; Creatinine, Ser 0.83; Potassium 4.4; Sodium 140  Recent Lipid Panel    Component Value Date/Time   CHOL (H) 06/11/2008 0825    227        ATP III CLASSIFICATION:  <200     mg/dL   Desirable  200-239  mg/dL   Borderline High  >=240    mg/dL   High   TRIG 113  06/11/2008 0825   HDL 56 06/11/2008 0825   CHOLHDL 4.1 06/11/2008 0825   VLDL 23 06/11/2008 0825   LDLCALC (H) 06/11/2008 0825    148        Total Cholesterol/HDL:CHD Risk Coronary Heart Disease Risk Table                     Men   Women  1/2 Average Risk   3.4   3.3    Physical Exam:    VS:  BP 116/84 (BP Location: Right Arm, Patient Position: Sitting, Cuff Size: Large)   Pulse 94  Resp (!) 98   Ht 5\' 5"  (1.651 m)   Wt 233 lb 12.8 oz (106.1 kg)   BMI 38.91 kg/m     Wt Readings from Last 3 Encounters:  01/14/18 233 lb 12.8 oz (106.1 kg)  11/28/17 229 lb (103.9 kg)  09/22/17 225 lb (102.1 kg)     GEN:  Well nourished, well developed in no acute distress HEENT: Normal NECK: No JVD; No carotid bruits LYMPHATICS: No lymphadenopathy CARDIAC: S3 RRR, no murmurs, rubs, gallops RESPIRATORY:  Clear to auscultation without rales, wheezing or rhonchi  ABDOMEN: Soft, non-tender, non-distended MUSCULOSKELETAL:  2+ lower extremiy bilateral edema; No deformity  SKIN: Warm and dry NEUROLOGIC:  Alert and oriented x 3 PSYCHIATRIC:  Normal affect    Signed, Shirlee More, MD  01/14/2018 9:35 AM    [place]

## 2018-01-14 ENCOUNTER — Ambulatory Visit (INDEPENDENT_AMBULATORY_CARE_PROVIDER_SITE_OTHER): Payer: BLUE CROSS/BLUE SHIELD | Admitting: Cardiology

## 2018-01-14 ENCOUNTER — Encounter: Payer: Self-pay | Admitting: Cardiology

## 2018-01-14 VITALS — BP 116/84 | HR 94 | Resp 98 | Ht 65.0 in | Wt 233.8 lb

## 2018-01-14 DIAGNOSIS — Z95810 Presence of automatic (implantable) cardiac defibrillator: Secondary | ICD-10-CM

## 2018-01-14 DIAGNOSIS — I5022 Chronic systolic (congestive) heart failure: Secondary | ICD-10-CM | POA: Diagnosis not present

## 2018-01-14 DIAGNOSIS — I110 Hypertensive heart disease with heart failure: Secondary | ICD-10-CM | POA: Diagnosis not present

## 2018-01-14 MED ORDER — TORSEMIDE 100 MG PO TABS: 50.0000 mg | ORAL_TABLET | Freq: Every day | ORAL | 3 refills | Status: DC

## 2018-01-14 MED ORDER — TORSEMIDE 20 MG PO TABS: 20.0000 mg | ORAL_TABLET | Freq: Every day | ORAL | 2 refills | Status: DC

## 2018-01-14 MED ORDER — SACUBITRIL-VALSARTAN 24-26 MG PO TABS: 1.0000 | ORAL_TABLET | Freq: Two times a day (BID) | ORAL | 2 refills | Status: DC

## 2018-01-14 NOTE — Patient Instructions (Signed)
Medication Instructions:  Your physician has recommended you make the following change in your medication:  STOP furosemide START torsemide 50 mg (0.5 tablet) twice daily INCREASE Entresto to twice daily  Labwork: Your physician recommends that you return for lab work on Monday at [facility] in [place]. BMP, BNP  Testing/Procedures: None  Follow-Up: Your physician recommends that you schedule a follow-up appointment in: 1 week.  Any Other Special Instructions Will Be Listed Below (If Applicable).     If you need a refill on your cardiac medications before your next appointment, please call your pharmacy.

## 2018-01-19 ENCOUNTER — Telehealth: Payer: Self-pay

## 2018-01-19 DIAGNOSIS — I110 Hypertensive heart disease with heart failure: Secondary | ICD-10-CM | POA: Diagnosis not present

## 2018-01-19 DIAGNOSIS — I5022 Chronic systolic (congestive) heart failure: Secondary | ICD-10-CM | POA: Diagnosis not present

## 2018-01-19 NOTE — Telephone Encounter (Signed)
Patient called to advise on how medication adjustments have been working.  Day 1: lost 5 pounds Day 2: lost 1 pound Day 3: lost 0.5 pound Day 4: gained 2 pounds  Patient has also become short of breath more than normal. Her heart rate resting is 105, when she walks to the bathroom it goes up to 120.   Patient is going now to get BMP, BNP at [facility] in [place] as ordered at last office visit.  Please advise.

## 2018-01-20 ENCOUNTER — Telehealth: Payer: Self-pay

## 2018-01-20 DIAGNOSIS — I5022 Chronic systolic (congestive) heart failure: Secondary | ICD-10-CM

## 2018-01-20 LAB — BASIC METABOLIC PANEL
BUN / CREAT RATIO: 24 — AB (ref 9–23)
BUN: 22 mg/dL (ref 6–24)
CHLORIDE: 91 mmol/L — AB (ref 96–106)
CO2: 30 mmol/L — ABNORMAL HIGH (ref 20–29)
Calcium: 9.8 mg/dL (ref 8.7–10.2)
Creatinine, Ser: 0.92 mg/dL (ref 0.57–1.00)
GFR calc Af Amer: 80 mL/min/{1.73_m2} (ref >59)
GFR calc non Af Amer: 70 mL/min/{1.73_m2} (ref >59)
Glucose: 97 mg/dL (ref 65–99)
Potassium: 3.2 mmol/L — ABNORMAL LOW (ref 3.5–5.2)
SODIUM: 137 mmol/L (ref 134–144)

## 2018-01-20 LAB — PRO B NATRIURETIC PEPTIDE: NT-PRO BNP: 552 pg/mL — AB (ref 0–287)

## 2018-01-20 MED ORDER — SPIRONOLACTONE 25 MG PO TABS: 25.0000 mg | ORAL_TABLET | Freq: Every day | ORAL | 11 refills | Status: DC

## 2018-01-20 MED ORDER — POTASSIUM CHLORIDE ER 20 MEQ PO TBCR: 1.0000 | EXTENDED_RELEASE_TABLET | Freq: Every day | ORAL | 11 refills | Status: DC

## 2018-01-20 NOTE — Telephone Encounter (Signed)
-----   Message from Richardo Priest, MD sent at 01/20/2018  7:50 AM EST ----- 1. Restart spironolactone  Needs K 20 meq / day   Recheck BMP 2 weeks

## 2018-01-20 NOTE — Telephone Encounter (Signed)
Patient informed of results of lab work. Advised patient to restart spironolactone 25 mg and add potassium 20 mEq daily. Patient verbalized understanding. Advised patient to have repeat lab work in 2 weeks. Patient verbalized understanding. No further questions.

## 2018-01-22 NOTE — Progress Notes (Signed)
Cardiology Office Note:    Date:  01/23/2018   ID:  Allison White, DOB June 04, 1961, MRN [id number]  PCP:  Lillard Anes, MD  Cardiologist:  Shirlee More, MD    Referring MD: Lillard Anes,*    ASSESSMENT:    1. Chronic systolic congestive heart failure ([place])   2. Coronary artery disease involving native coronary artery of native heart with angina pectoris ([place])   3. Hypertensive heart disease with heart failure ([place])   4. Other hyperlipidemia   5. Presence of automatic (implantable) cardiac defibrillator    PLAN:    In order of problems listed above:  1. Worsened I will refer her for advanced heart failure evaluation and cardiac CTA to determine volumes and a calculated ejection fraction.  Recheck BMP proBNP today 2. Stable continue current medical treatment 3. Blood pressure is borderline low at this time I believe on her current guideline directed treatment with loop diuretic spironolactone carvedilol and ARNI.  Is been very difficult and I do not think I can up titrate her doses any further because of limiting hypotension 4. Continue her statin 5. Stable followed at my previous practice [facility]   Next appointment: I will see her back in the office after advanced heart failure evaluation and treatment   Medication Adjustments/Labs and Tests Ordered: Current medicines are reviewed at length with the patient today.  Concerns regarding medicines are outlined above.  Orders Placed This Encounter  Procedures  . CT CORONARY MORPH W/CTA COR W/SCORE W/CA W/CM &/OR WO/CM  . Basic Metabolic Panel (BMET)  . Pro b natriuretic peptide (BNP)  . AMB referral to CHF clinic   No orders of the defined types were placed in this encounter.   Chief Complaint  Patient presents with  . Follow-up    1 week flup appt after medication changes     History of Present Illness:    Allison White is a 57 y.o. female with a hx of Anterior-LAD STEMI 11/19/16 and Xience  drug-eluting stent to the proximal LAD with angioplasty to the LAD-diagonal bifurcation , CAD,systolicCHF poorly tolerant of ACEI with hypotension , HTN with severelly reduced EF 23%and ICD 1 week ago last seen 01/14/18. Compliance with diet, lifestyle and medications: Yes Despite intensifying her diuretics and initial weight loss she is unimproved her weight is at baseline and she feels increasingly fatigued with her current medical treatment.  She has severe left ventricular dysfunction and I am concerned that she is progressing towards stage D heart failure.  I asked her to have an advanced heart failure evaluation at [facility] and repeat determination of ejection fraction.  I am looking for subtle changes I do not think an echocardiogram or gated forward providers ideally I think an MRI would be helpful here to define volumes viability ejection fraction but she is intolerant we will perform a cardiac CTA.  Will leave on her current medical treatment or be proBNP level is relatively low but clinically she is not over diuresis she still has trace peripheral edema and despite a marginal blood pressure leave her 100 current medical treatment including loop diuretic carvedilol ARNI and spironolactone.  Her blood pressure will not allow me to uptitrate.  Her predominant complaint is malaise and fatigue reduced exercise tolerance she is not short of breath no chest pain palpitation or syncope.  She had an ICD put him and is never had VT VF therapy.  Her device continues to be followed in my previous  practice pending return to [facility]. Past Medical History:  Diagnosis Date  . Ejection fraction < 50%    23% as of August 2018  . Headache(784.0)   . HTN (hypertension)   . Hyperlipidemia   . MI (myocardial infarction) ([place])   . Nonruptured cerebral aneurysm, internal carotid artery    Left side, stent placement (2009)  . Vertigo     Past Surgical History:  Procedure Laterality Date  .  ABDOMINAL HYSTERECTOMY    . CARDIAC DEFIBRILLATOR PLACEMENT    . CEREBRAL ANEURYSM REPAIR Left 2009  . CORONARY ANGIOPLASTY WITH STENT PLACEMENT    . PACEMAKER IMPLANT      Current Medications: Current Meds  Medication Sig  . aspirin 81 MG chewable tablet Chew 81 mg by mouth daily.  [name] atorvastatin (LIPITOR) 20 MG tablet Take 1 tablet (20 mg total) by mouth daily.  [name] HYDROcodone-acetaminophen (NORCO) 7.5-325 MG per tablet Take 1 tablet by mouth every 6 (six) hours as needed for moderate pain.  . metoprolol succinate (TOPROL-XL) 25 MG 24 hr tablet Take 1 tablet (25 mg total) by mouth 2 (two) times daily. Take an extra 12.5 mg mid day  . nitroGLYCERIN (NITROSTAT) 0.4 MG SL tablet Place 0.4 mg under the tongue every 5 (five) minutes as needed.   . phenazopyridine (PYRIDIUM) 97 MG tablet Take 97 mg by mouth 3 (three) times daily as needed for pain.  [name] Potassium Chloride ER 20 MEQ TBCR Take 1 tablet by mouth daily.  . sacubitril-valsartan (ENTRESTO) 24-26 MG Take 1 tablet by mouth 2 (two) times daily.  [name] spironolactone (ALDACTONE) 25 MG tablet Take 1 tablet (25 mg total) by mouth daily.  . ticagrelor (BRILINTA) 90 MG TABS tablet Take 1 tablet (90 mg total) by mouth 2 (two) times daily.  [name] torsemide (DEMADEX) 100 MG tablet Take 0.5 tablets (50 mg total) by mouth daily. (Patient taking differently: Take 50 mg by mouth 2 (two) times daily. )     Allergies:   Clopidogrel; Tape; and Plavix [clopidogrel bisulfate]   Social History   Socioeconomic History  . Marital status: Married    Spouse name: None  . Number of children: None  . Years of education: None  . Highest education level: None  Social Needs  . Financial resource strain: None  . Food insecurity - worry: None  . Food insecurity - inability: None  . Transportation needs - medical: None  . Transportation needs - non-medical: None  Occupational History  . None  Tobacco Use  . Smoking status: Former Smoker    Packs/day: 1.00     Years: 6.00    Pack years: 6.00    Types: Cigarettes  . Smokeless tobacco: Never Used  . Tobacco comment: Quit 30 years ago  Substance and Sexual Activity  . Alcohol use: Yes    Comment: Occasionally  . Drug use: No  . Sexual activity: None  Other Topics Concern  . 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       Family History: The patient's family history includes Cancer in her father; Cerebral aneurysm in her paternal grandmother; Hypertension in her brother; Thyroid disease in her mother. ROS:   Please see the history of present illness.    All other systems reviewed and are negative.  EKGs/Labs/Other Studies Reviewed:    The following studies were reviewed today:   Recent Labs: 05/20/2017: BNP CANCELED 07/18/2017: Hemoglobin  14.8; Platelets 235 01/19/2018: BUN 22; Creatinine, Ser 0.92; NT-Pro BNP 552; Potassium 3.2; Sodium 137  Recent Lipid Panel    Component Value Date/Time   CHOL (H) 06/11/2008 0825    227        ATP III CLASSIFICATION:  <200     mg/dL   Desirable  200-239  mg/dL   Borderline High  >=240    mg/dL   High   TRIG 113 06/11/2008 0825   HDL 56 06/11/2008 0825   CHOLHDL 4.1 06/11/2008 0825   VLDL 23 06/11/2008 0825   LDLCALC (H) 06/11/2008 0825    148        Total Cholesterol/HDL:CHD Risk Coronary Heart Disease Risk Table                     Men   Women  1/2 Average Risk   3.4   3.3    Physical Exam:    VS:  BP 96/68 (BP Location: Right Arm, Patient Position: Sitting, Cuff Size: Large)   Pulse 85   Ht 5\' 5"  (1.651 m)   Wt 232 lb (105.2 kg)   SpO2 98%   BMI 38.61 kg/m     Wt Readings from Last 3 Encounters:  01/23/18 232 lb (105.2 kg)  01/14/18 233 lb 12.8 oz (106.1 kg)  11/28/17 229 lb (103.9 kg)     GEN: She does not appear chronically ill well nourished, well developed in no acute distress HEENT: Normal NECK: No JVD; No carotid bruits LYMPHATICS: No  lymphadenopathy CARDIAC: Soft S1 no S3 RRR, no murmurs, rubs, gallops RESPIRATORY:  Clear to auscultation without rales, wheezing or rhonchi  ABDOMEN: Soft, non-tender, non-distended MUSCULOSKELETAL: Trace to 1+ edema edema; No deformity  SKIN: Warm and dry NEUROLOGIC:  Alert and oriented x 3 PSYCHIATRIC:  Normal affect    Signed, Shirlee More, MD  01/23/2018 11:17 AM    [place]

## 2018-01-23 ENCOUNTER — Ambulatory Visit: Payer: BLUE CROSS/BLUE SHIELD | Admitting: Cardiology

## 2018-01-23 ENCOUNTER — Encounter: Payer: Self-pay | Admitting: Cardiology

## 2018-01-23 VITALS — BP 96/68 | HR 85 | Ht 65.0 in | Wt 232.0 lb

## 2018-01-23 DIAGNOSIS — I110 Hypertensive heart disease with heart failure: Secondary | ICD-10-CM

## 2018-01-23 DIAGNOSIS — I5022 Chronic systolic (congestive) heart failure: Secondary | ICD-10-CM | POA: Diagnosis not present

## 2018-01-23 DIAGNOSIS — E7849 Other hyperlipidemia: Secondary | ICD-10-CM

## 2018-01-23 DIAGNOSIS — Z95810 Presence of automatic (implantable) cardiac defibrillator: Secondary | ICD-10-CM | POA: Diagnosis not present

## 2018-01-23 DIAGNOSIS — I25119 Atherosclerotic heart disease of native coronary artery with unspecified angina pectoris: Secondary | ICD-10-CM | POA: Diagnosis not present

## 2018-01-23 NOTE — Patient Instructions (Signed)
Medication Instructions:  Your physician recommends that you continue on your current medications as directed. Please refer to the Current Medication list given to you today.   Labwork: Your physician recommends that you return for lab work today: BMP, pro-BNP.   Testing/Procedures: Your physician has requested that you have cardiac CT. Cardiac computed tomography (CT) is a painless test that uses an x-ray machine to take clear, detailed pictures of your heart. For further information please visit [facility]. Please follow instruction sheet as given.   Follow-Up: Your physician recommends that you schedule a follow-up appointment in: 6 weeks.   You'll receive a phone call to schedule appointment with CHF clinic.   Any Other Special Instructions Will Be Listed Below (If Applicable).     If you need a refill on your cardiac medications before your next appointment, please call your pharmacy.

## 2018-01-24 LAB — BASIC METABOLIC PANEL
BUN / CREAT RATIO: 24 — AB (ref 9–23)
BUN: 22 mg/dL (ref 6–24)
CO2: 26 mmol/L (ref 20–29)
CREATININE: 0.9 mg/dL (ref 0.57–1.00)
Calcium: 9.6 mg/dL (ref 8.7–10.2)
Chloride: 96 mmol/L (ref 96–106)
GFR, EST AFRICAN AMERICAN: 83 mL/min/{1.73_m2} (ref >59)
GFR, EST NON AFRICAN AMERICAN: 72 mL/min/{1.73_m2} (ref >59)
Glucose: 99 mg/dL (ref 65–99)
POTASSIUM: 3.5 mmol/L (ref 3.5–5.2)
Sodium: 140 mmol/L (ref 134–144)

## 2018-01-24 LAB — PRO B NATRIURETIC PEPTIDE: NT-Pro BNP: 694 pg/mL — ABNORMAL HIGH (ref 0–287)

## 2018-01-26 ENCOUNTER — Ambulatory Visit: Payer: BLUE CROSS/BLUE SHIELD | Admitting: Cardiology

## 2018-02-06 DIAGNOSIS — I5022 Chronic systolic (congestive) heart failure: Secondary | ICD-10-CM | POA: Diagnosis not present

## 2018-02-06 DIAGNOSIS — E782 Mixed hyperlipidemia: Secondary | ICD-10-CM | POA: Diagnosis not present

## 2018-02-06 DIAGNOSIS — I10 Essential (primary) hypertension: Secondary | ICD-10-CM | POA: Diagnosis not present

## 2018-02-06 DIAGNOSIS — I773 Arterial fibromuscular dysplasia: Secondary | ICD-10-CM | POA: Diagnosis not present

## 2018-02-25 ENCOUNTER — Telehealth (HOSPITAL_COMMUNITY): Payer: Self-pay | Admitting: Vascular Surgery

## 2018-02-25 ENCOUNTER — Telehealth: Payer: Self-pay | Admitting: Cardiology

## 2018-02-25 NOTE — Telephone Encounter (Signed)
Patient states that she has still not heard about referal to EP Dr?? And CTA?? Please call!

## 2018-02-25 NOTE — Telephone Encounter (Signed)
Left pt message to make urgent appt w/ db 02/26/18 @ 1:40 or 4/18

## 2018-02-25 NOTE — Telephone Encounter (Signed)
Advised patient a message has been sent to the cardiac CTA scheduler about contacting the patient. Also, advised patient a message has been sent to a nurse in the CHF clinic about getting her scheduled for an appointment. Patient verbalized understanding. No further questions.

## 2018-02-26 ENCOUNTER — Encounter (HOSPITAL_COMMUNITY): Payer: Self-pay | Admitting: Internal Medicine

## 2018-02-26 ENCOUNTER — Other Ambulatory Visit: Payer: Self-pay

## 2018-02-26 ENCOUNTER — Ambulatory Visit (HOSPITAL_COMMUNITY)
Admission: RE | Admit: 2018-02-26 | Discharge: 2018-02-26 | Disposition: A | Discharge status: Home / Self Care | Payer: BLUE CROSS/BLUE SHIELD | Source: Ambulatory Visit | Attending: Internal Medicine | Admitting: Internal Medicine

## 2018-02-26 VITALS — BP 109/53 | HR 97 | Wt 238.2 lb

## 2018-02-26 DIAGNOSIS — I5022 Chronic systolic (congestive) heart failure: Secondary | ICD-10-CM

## 2018-02-26 DIAGNOSIS — I2510 Atherosclerotic heart disease of native coronary artery without angina pectoris: Secondary | ICD-10-CM | POA: Insufficient documentation

## 2018-02-26 DIAGNOSIS — Z7982 Long term (current) use of aspirin: Secondary | ICD-10-CM | POA: Diagnosis not present

## 2018-02-26 DIAGNOSIS — E669 Obesity, unspecified: Secondary | ICD-10-CM | POA: Diagnosis not present

## 2018-02-26 DIAGNOSIS — Z87891 Personal history of nicotine dependence: Secondary | ICD-10-CM | POA: Insufficient documentation

## 2018-02-26 DIAGNOSIS — I25119 Atherosclerotic heart disease of native coronary artery with unspecified angina pectoris: Secondary | ICD-10-CM | POA: Diagnosis not present

## 2018-02-26 DIAGNOSIS — I110 Hypertensive heart disease with heart failure: Secondary | ICD-10-CM | POA: Insufficient documentation

## 2018-02-26 DIAGNOSIS — Z79899 Other long term (current) drug therapy: Secondary | ICD-10-CM | POA: Diagnosis not present

## 2018-02-26 LAB — BASIC METABOLIC PANEL
Anion gap: 14 (ref 5–15)
BUN: 22 mg/dL — AB (ref 6–20)
CALCIUM: 9.6 mg/dL (ref 8.9–10.3)
CHLORIDE: 98 mmol/L — AB (ref 101–111)
CO2: 27 mmol/L (ref 22–32)
CREATININE: 1.12 mg/dL — AB (ref 0.44–1.00)
GFR calc non Af Amer: 54 mL/min — ABNORMAL LOW (ref >60)
Glucose, Bld: 106 mg/dL — ABNORMAL HIGH (ref 65–99)
Potassium: 4.6 mmol/L (ref 3.5–5.1)
SODIUM: 139 mmol/L (ref 135–145)

## 2018-02-26 LAB — BRAIN NATRIURETIC PEPTIDE: B NATRIURETIC PEPTIDE 5: 77.4 pg/mL (ref 0.0–100.0)

## 2018-02-26 MED ORDER — DIGOXIN 125 MCG PO TABS: 0.1250 mg | ORAL_TABLET | Freq: Every day | ORAL | 6 refills | Status: DC

## 2018-02-26 NOTE — Patient Instructions (Signed)
Start Digoxin 0.125 mg daily  Labs today  Your physician has requested that you have an echocardiogram. Echocardiography is a painless test that uses sound waves to create images of your heart. It provides your doctor with information about the size and shape of your heart and how well your heart's chambers and valves are working. This procedure takes approximately one hour. There are no restrictions for this procedure.  Your physician has recommended that you have a cardiopulmonary stress test (CPX). CPX testing is a non-invasive measurement of heart and lung function. It replaces a traditional treadmill stress test. This type of test provides a tremendous amount of information that relates not only to your present condition but also for future outcomes. This test combines measurements of you ventilation, respiratory gas exchange in the lungs, electrocardiogram (EKG), blood pressure and physical response before, during, and following an exercise protocol.  Your physician recommends that you schedule a follow-up appointment in: 6 weeks

## 2018-02-26 NOTE — Progress Notes (Signed)
ADVANCED HF CLINIC CONSULT NOTE  Referring Physician: Dr. Bettina Gavia   HPI:  Allison White is a 57 y.o. woman with a h/o morbid obesity, HTN, carotid artery dissection with stenting of left carotid due to possible FMD, CAD s/p anterior MI 1/18 (DES to LAD), systolic HF with EF 73-71% s/p Biotronik ICD who is referred by Dr. Bettina Gavia for assistance with management of her HF.  After her MI went to [facility] for a few weeks and stopped because she couldn't afford it. Over the summer felt better. More active. In October started feeling worse. Now SOB and fatigued even with daily activities. Mild edema that doesn't respond to lasix. Up 20+ pounds since October. Switched to torsemide but not working as well either. Gets occasional CP that feel like gassy pain. Different from previous MI. Denies snoring. Watches salt very closely. Stays under 1500cc. Tried to keep fluid intake down. No orthostasis.   Echo 8/18 EF 20-25%   Review of Systems: [y] = yes, [ ]  = no   General: Weight gain [ y]; Weight loss [ ] ; Anorexia [ ] ; Fatigue Blue.Reese ]; Fever [ ] ; Chills [ ] ; Weakness [ ]   Cardiac: Chest pain/pressure [ ] ; Resting SOB [ ] ; Exertional SOB Blue.Reese ]; Orthopnea [ ] ; Pedal Edema [ y]; Palpitations [ ] ; Syncope [ ] ; Presyncope [ ] ; Paroxysmal nocturnal dyspnea[ ]   Pulmonary: Cough [ ] ; Wheezing[ ] ; Hemoptysis[ ] ; Sputum [ ] ; Snoring [ ]   GI: Vomiting[ ] ; Dysphagia[ ] ; Melena[ ] ; Hematochezia [ ] ; Heartburn[ ] ; Abdominal pain [ ] ; Constipation [ ] ; Diarrhea [ ] ; BRBPR [ ]   GU: Hematuria[ ] ; Dysuria [ ] ; Nocturia[ ]   Vascular: Pain in legs with walking [ ] ; Pain in feet with lying flat [ ] ; Non-healing sores [ ] ; Stroke [ ] ; TIA [ ] ; Slurred speech [ ] ;  Neuro: Headaches[ ] ; Vertigo[ ] ; Seizures[ ] ; Paresthesias[ ] ;Blurred vision [ ] ; Diplopia [ ] ; Vision changes [ ]   Ortho/Skin: Arthritis Blue.Reese ]; Joint pain Blue.Reese ]; Muscle pain [ ] ; Joint swelling [ ] ; Back Pain [ ] ; Rash [ ]   Psych: Depression[ ] ; Anxiety[ ]   Heme: Bleeding  problems [ ] ; Clotting disorders [ ] ; Anemia [ ]   Endocrine: Diabetes [ ] ; Thyroid dysfunction[ ]    Past Medical History:  Diagnosis Date  . Ejection fraction < 50%    23% as of August 2018  . Headache(784.0)   . HTN (hypertension)   . Hyperlipidemia   . MI (myocardial infarction) ([place])   . Nonruptured cerebral aneurysm, internal carotid artery    Left side, stent placement (2009)  . Vertigo     Current Outpatient Medications  Medication Sig Dispense Refill  . aspirin 81 MG chewable tablet Chew 81 mg by mouth daily.    [name] atorvastatin (LIPITOR) 20 MG tablet Take 1 tablet (20 mg total) by mouth daily. 90 tablet 3  . HYDROcodone-acetaminophen (NORCO) 7.5-325 MG per tablet Take 1 tablet by mouth every 6 (six) hours as needed for moderate pain.    . metoprolol succinate (TOPROL-XL) 25 MG 24 hr tablet Take 1 tablet (25 mg total) by mouth 2 (two) times daily. Take an extra 12.5 mg mid day 180 tablet 3  . nitroGLYCERIN (NITROSTAT) 0.4 MG SL tablet Place 0.4 mg under the tongue every 5 (five) minutes as needed.     . phenazopyridine (PYRIDIUM) 97 MG tablet Take 97 mg by mouth 3 (three) times daily as needed for pain.    [name] Potassium Chloride  ER 20 MEQ TBCR Take 1 tablet by mouth daily. 30 tablet 11  . sacubitril-valsartan (ENTRESTO) 24-26 MG Take 1 tablet by mouth 2 (two) times daily. 60 tablet 2  . spironolactone (ALDACTONE) 25 MG tablet Take 1 tablet (25 mg total) by mouth daily. 30 tablet 11  . ticagrelor (BRILINTA) 90 MG TABS tablet Take 1 tablet (90 mg total) by mouth 2 (two) times daily. 180 tablet 3  . torsemide (DEMADEX) 100 MG tablet Take 0.5 tablets (50 mg total) by mouth daily. (Patient taking differently: Take 50 mg by mouth 2 (two) times daily. ) 90 tablet 3   No current facility-administered medications for this encounter.     Allergies  Allergen Reactions  . Clopidogrel Other (See Comments)    JOINT ACHES, JOINT POPS  . Tape Rash    PREFERS CLOTH OR NOTHING  . Plavix  [Clopidogrel Bisulfate]       Social History   Socioeconomic History  . Marital status: Married    Spouse name: Not on file  . Number of children: Not on file  . Years of education: Not on file  . Highest education level: Not on file  Occupational History  . Not on file  Social Needs  . Financial resource strain: Not on file  . Food insecurity:    Worry: Not on file    Inability: Not on file  . Transportation needs:    Medical: Not on file    Non-medical: Not on file  Tobacco Use  . Smoking status: Former Smoker    Packs/day: 1.00    Years: 6.00    Pack years: 6.00    Types: Cigarettes  . Smokeless tobacco: Never Used  . Tobacco comment: Quit 30 years ago  Substance and Sexual Activity  . Alcohol use: Yes    Comment: Occasionally  . Drug use: No  . Sexual activity: Not on file  Lifestyle  . Physical activity:    Days per week: Not on file    Minutes per session: Not on file  . Stress: Not on file  Relationships  . Social connections:    Talks on phone: Not on file    Gets together: Not on file    Attends religious service: Not on file    Active member of club or organization: Not on file    Attends meetings of clubs or organizations: Not on file    Relationship status: Not on file  . Intimate partner violence:    Fear of current or ex partner: Not on file    Emotionally abused: Not on file    Physically abused: Not on file    Forced sexual activity: Not on file  Other Topics Concern  . 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        Family History  Problem Relation Age of Onset  . Thyroid disease Mother   . Cancer Father        Prostate  . Hypertension Brother   . Cerebral aneurysm Paternal Grandmother        Nonruptured    Vitals:   02/26/18 1353  BP: (!) 109/53  Pulse: 97  SpO2: 100%  Weight: 238 lb 3.2 oz (108 kg)    PHYSICAL EXAM: General:  Well  appearing. No respiratory difficulty HEENT: normal Neck: supple. no JVD. Carotids 2+ bilat; no bruits. No lymphadenopathy or thryomegaly appreciated. Cor:  PMI nondisplaced. Mildly tachy Regular  No rubs, gallops or murmurs. Lungs: clear Abdomen: obese soft, nontender, nondistended. No hepatosplenomegaly. No bruits or masses. Good bowel sounds. Extremities: no cyanosis, clubbing, rash, edema Neuro: alert & oriented x 3, cranial nerves grossly intact. moves all 4 extremities w/o difficulty. Affect pleasant.  ECG: NSR 91. No ST-T wave abnormalities. QRS 98 ms Personally reviewed   ASSESSMENT & PLAN: 1. Chronic systolic HF - due to ICM EF 20-25% - NYHA III-IIIb symptoms - volume status looks good.  - On good meds. BP soft - Will add digoxin 0.125 - Repeat echo  - CPX test. Low threshold for R/L cath to further assess need for advanced therapies   2. CAD s/p anterior MI 1/18 with DES to LAD in [place] - no current angina - continue ASA, b-blocker and statin   3. Obesity - likely contributing to exercise intolerance  Glori Bickers, MD  10:58 PM

## 2018-03-06 ENCOUNTER — Ambulatory Visit: Payer: BLUE CROSS/BLUE SHIELD | Admitting: Cardiology

## 2018-03-17 ENCOUNTER — Ambulatory Visit (HOSPITAL_COMMUNITY): Payer: BLUE CROSS/BLUE SHIELD

## 2018-03-17 ENCOUNTER — Ambulatory Visit (HOSPITAL_COMMUNITY)
Admission: RE | Admit: 2018-03-17 | Discharge: 2018-03-17 | Disposition: A | Discharge status: Home / Self Care | Payer: BLUE CROSS/BLUE SHIELD | Source: Ambulatory Visit | Attending: Legal Medicine | Admitting: Legal Medicine

## 2018-03-17 DIAGNOSIS — I071 Rheumatic tricuspid insufficiency: Secondary | ICD-10-CM | POA: Diagnosis not present

## 2018-03-17 DIAGNOSIS — I252 Old myocardial infarction: Secondary | ICD-10-CM | POA: Diagnosis not present

## 2018-03-17 DIAGNOSIS — I5022 Chronic systolic (congestive) heart failure: Secondary | ICD-10-CM | POA: Insufficient documentation

## 2018-03-17 NOTE — Progress Notes (Signed)
  Echocardiogram 2D Echocardiogram has been performed.  Allison White 03/17/2018, 11:08 AM

## 2018-03-19 ENCOUNTER — Ambulatory Visit (INDEPENDENT_AMBULATORY_CARE_PROVIDER_SITE_OTHER): Payer: BLUE CROSS/BLUE SHIELD | Admitting: *Deleted

## 2018-03-19 ENCOUNTER — Telehealth (HOSPITAL_COMMUNITY): Payer: Self-pay | Admitting: *Deleted

## 2018-03-19 DIAGNOSIS — I5022 Chronic systolic (congestive) heart failure: Secondary | ICD-10-CM

## 2018-03-19 LAB — CUP PACEART REMOTE DEVICE CHECK
Brady Statistic AP VP Percent: 0 %
Brady Statistic AP VS Percent: 0 %
Brady Statistic AS VP Percent: 0 %
Brady Statistic AS VS Percent: 99 %
Brady Statistic RA Percent Paced: 0 %
HIGH POWER IMPEDANCE MEASURED VALUE: 93 Ohm
Implantable Lead Implant Date: 20180410
Implantable Lead Implant Date: 20180410
Implantable Lead Location: 753860
Implantable Lead Model: 402266
Lead Channel Pacing Threshold Amplitude: 0.6 V
Lead Channel Pacing Threshold Pulse Width: 0.4 ms
Lead Channel Setting Pacing Amplitude: 2.5 V
Lead Channel Setting Sensing Sensitivity: 0.8 mV
MDC IDC LEAD LOCATION: 753859
MDC IDC LEAD SERIAL: 49794726
MDC IDC LEAD SERIAL: 49838890
MDC IDC MSMT BATTERY REMAINING PERCENTAGE: 100 %
MDC IDC MSMT BATTERY VOLTAGE: 3.11 V
MDC IDC MSMT LEADCHNL RA IMPEDANCE VALUE: 618 Ohm
MDC IDC MSMT LEADCHNL RA PACING THRESHOLD AMPLITUDE: 0.7 V
MDC IDC MSMT LEADCHNL RV IMPEDANCE VALUE: 522 Ohm
MDC IDC MSMT LEADCHNL RV PACING THRESHOLD PULSEWIDTH: 0.4 ms
MDC IDC PG IMPLANT DT: 20180410
MDC IDC PG SERIAL: 60982098
MDC IDC SESS DTM: 20190502042920
MDC IDC SET LEADCHNL RA PACING AMPLITUDE: 2 V
MDC IDC SET LEADCHNL RV PACING PULSEWIDTH: 0.4 ms
MDC IDC STAT BRADY RV PERCENT PACED: 0 %

## 2018-03-19 NOTE — Progress Notes (Signed)
Remote ICD transmission.   

## 2018-03-19 NOTE — Telephone Encounter (Signed)
Result Notes for ECHOCARDIOGRAM COMPLETE   Notes recorded by Darron Doom, RN on 03/19/2018 at 10:05 AM EDT Called and spoke with patient, she is aware and no further questions. ------  Notes recorded by Jolaine Artist, MD on 03/18/2018 at 11:33 PM EDT EF stable

## 2018-03-24 ENCOUNTER — Encounter: Payer: Self-pay | Admitting: Cardiology

## 2018-04-09 ENCOUNTER — Encounter: Payer: Self-pay | Admitting: Family Medicine

## 2018-04-10 ENCOUNTER — Ambulatory Visit (HOSPITAL_COMMUNITY)
Admission: RE | Admit: 2018-04-10 | Discharge: 2018-04-10 | Disposition: A | Discharge status: Home / Self Care | Payer: BLUE CROSS/BLUE SHIELD | Source: Ambulatory Visit | Attending: Internal Medicine | Admitting: Internal Medicine

## 2018-04-10 ENCOUNTER — Encounter (HOSPITAL_COMMUNITY): Payer: Self-pay | Admitting: Internal Medicine

## 2018-04-10 VITALS — BP 102/46 | HR 93 | Wt 233.8 lb

## 2018-04-10 DIAGNOSIS — E785 Hyperlipidemia, unspecified: Secondary | ICD-10-CM | POA: Insufficient documentation

## 2018-04-10 DIAGNOSIS — I252 Old myocardial infarction: Secondary | ICD-10-CM | POA: Insufficient documentation

## 2018-04-10 DIAGNOSIS — Z6839 Body mass index (BMI) 39.0-39.9, adult: Secondary | ICD-10-CM | POA: Insufficient documentation

## 2018-04-10 DIAGNOSIS — Z87891 Personal history of nicotine dependence: Secondary | ICD-10-CM | POA: Diagnosis not present

## 2018-04-10 DIAGNOSIS — Z7982 Long term (current) use of aspirin: Secondary | ICD-10-CM | POA: Insufficient documentation

## 2018-04-10 DIAGNOSIS — Z79899 Other long term (current) drug therapy: Secondary | ICD-10-CM | POA: Insufficient documentation

## 2018-04-10 DIAGNOSIS — I110 Hypertensive heart disease with heart failure: Secondary | ICD-10-CM | POA: Insufficient documentation

## 2018-04-10 DIAGNOSIS — I2510 Atherosclerotic heart disease of native coronary artery without angina pectoris: Secondary | ICD-10-CM | POA: Insufficient documentation

## 2018-04-10 DIAGNOSIS — I5022 Chronic systolic (congestive) heart failure: Secondary | ICD-10-CM | POA: Diagnosis not present

## 2018-04-10 DIAGNOSIS — E669 Obesity, unspecified: Secondary | ICD-10-CM | POA: Diagnosis not present

## 2018-04-10 DIAGNOSIS — I25119 Atherosclerotic heart disease of native coronary artery with unspecified angina pectoris: Secondary | ICD-10-CM

## 2018-04-10 LAB — BASIC METABOLIC PANEL
ANION GAP: 11 (ref 5–15)
BUN: 17 mg/dL (ref 6–20)
CO2: 28 mmol/L (ref 22–32)
Calcium: 9.6 mg/dL (ref 8.9–10.3)
Chloride: 97 mmol/L — ABNORMAL LOW (ref 101–111)
Creatinine, Ser: 0.95 mg/dL (ref 0.44–1.00)
Glucose, Bld: 115 mg/dL — ABNORMAL HIGH (ref 65–99)
POTASSIUM: 4.2 mmol/L (ref 3.5–5.1)
SODIUM: 136 mmol/L (ref 135–145)

## 2018-04-10 LAB — DIGOXIN LEVEL: DIGOXIN LVL: 0.5 ng/mL — AB (ref 0.8–2.0)

## 2018-04-10 NOTE — Patient Instructions (Addendum)
Labs today  Your physician recommends that you schedule a follow-up appointment in: 4 months

## 2018-04-10 NOTE — Progress Notes (Signed)
Advanced Heart Failure Clinic Note   Referring Physician: Dr. Bettina Gavia  HPI:  Allison White is a 57 y.o. female a h/o morbid obesity, HTN, carotid artery dissection with stenting of left carotid due to possible FMD, CAD s/p anterior MI 1/18 (DES to LAD), systolic HF with EF 71-06% s/p Biotronik ICD who is referred by Dr. Bettina Gavia for assistance with management of her HF.  She presents today for follow up. Overall feeling OK. Notes she has palpitations and heart "racing" feels like it has been more so since starting digoxin. Palpitations were much worse for the first week or two after starting. Was especially noticeable during CPX. Having more good days than bad now. Noted to have sinus tachycardia in 110-120s with standing at CPX. Remains SOB changing clothes, bathing, taking a shower. Really SOB with stairs. Walks around stores. Got her pool open, so plans to start swimming most days but family says she just float around pool. She says she uses kickboard and kicks around pool  Denies any CP. Mild lightheadedness with rapid standing which is chronic. Weight up and down. No edema, orthopnea or PND.   ICD interrogation: Biotronik. No VT/VF. No Atrial Arrhythmias. Pt activity <10% daily. Personally reviewed   Echo 06/2017 EF 20-25% Echo 02/2018 LVEF 25-30%, Trivial MR, Normal RV, Mild TR, PA peak pressure 26 mm Hg  CPX 03/17/18 FVC 3.00 (87%), FEV1 2.97(88%), FEV1/FVC 79 (99%)  Peak VO2: 13.8 - When adjusted to the patient's ideal body weight of 142.2 lb (64.5 kg) the peak VO2 is 22.6 ml/kg VE/VCO2 slope: 38 OUES: 1.52 Peak RER: 1.11 Ventilatory Threshold: 11.8 VE/MVV:  95% O2pulse:  8 Interpretation: Mild limitation due to HF and obesity  Review of systems complete and found to be negative unless listed in HPI.    Past Medical History:  Diagnosis Date  . Ejection fraction < 50%    23% as of August 2018  . Headache(784.0)   . HTN (hypertension)   . Hyperlipidemia   . MI (myocardial  infarction) ([place])   . Nonruptured cerebral aneurysm, internal carotid artery    Left side, stent placement (2009)  . Vertigo     Current Outpatient Medications  Medication Sig Dispense Refill  . aspirin 81 MG chewable tablet Chew 81 mg by mouth daily.    [name] atorvastatin (LIPITOR) 20 MG tablet Take 1 tablet (20 mg total) by mouth daily. 90 tablet 3  . digoxin (LANOXIN) 0.125 MG tablet Take 1 tablet (0.125 mg total) by mouth daily. 30 tablet 6  . HYDROcodone-acetaminophen (NORCO) 7.5-325 MG per tablet Take 1 tablet by mouth every 6 (six) hours as needed for moderate pain.    . metoprolol succinate (TOPROL-XL) 25 MG 24 hr tablet Take 1 tablet (25 mg total) by mouth 2 (two) times daily. Take an extra 12.5 mg mid day 180 tablet 3  . nitroGLYCERIN (NITROSTAT) 0.4 MG SL tablet Place 0.4 mg under the tongue every 5 (five) minutes as needed.     . phenazopyridine (PYRIDIUM) 97 MG tablet Take 97 mg by mouth 3 (three) times daily as needed for pain.    [name] Potassium Chloride ER 20 MEQ TBCR Take 1 tablet by mouth daily. 30 tablet 11  . sacubitril-valsartan (ENTRESTO) 24-26 MG Take 1 tablet by mouth 2 (two) times daily. 60 tablet 2  . spironolactone (ALDACTONE) 25 MG tablet Take 1 tablet (25 mg total) by mouth daily. 30 tablet 11  . ticagrelor (BRILINTA) 90 MG TABS tablet Take 1 tablet (90  mg total) by mouth 2 (two) times daily. 180 tablet 3  . torsemide (DEMADEX) 100 MG tablet Take 50 mg by mouth 2 (two) times daily.     No current facility-administered medications for this encounter.     Allergies  Allergen Reactions  . Clopidogrel Other (See Comments)    JOINT ACHES, JOINT POPS  . Tape Rash    PREFERS CLOTH OR NOTHING  . Plavix [Clopidogrel Bisulfate]       Social History   Socioeconomic History  . Marital status: Married    Spouse name: Not on file  . Number of children: Not on file  . Years of education: Not on file  . Highest education level: Not on file  Occupational History  . Not  on file  Social Needs  . Financial resource strain: Not on file  . Food insecurity:    Worry: Not on file    Inability: Not on file  . Transportation needs:    Medical: Not on file    Non-medical: Not on file  Tobacco Use  . Smoking status: Former Smoker    Packs/day: 1.00    Years: 6.00    Pack years: 6.00    Types: Cigarettes  . Smokeless tobacco: Never Used  . Tobacco comment: Quit 30 years ago  Substance and Sexual Activity  . Alcohol use: Yes    Comment: Occasionally  . Drug use: No  . Sexual activity: Not on file  Lifestyle  . Physical activity:    Days per week: Not on file    Minutes per session: Not on file  . Stress: Not on file  Relationships  . Social connections:    Talks on phone: Not on file    Gets together: Not on file    Attends religious service: Not on file    Active member of club or organization: Not on file    Attends meetings of clubs or organizations: Not on file    Relationship status: Not on file  . Intimate partner violence:    Fear of current or ex partner: Not on file    Emotionally abused: Not on file    Physically abused: Not on file    Forced sexual activity: Not on file  Other Topics Concern  . 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        Family History  Problem Relation Age of Onset  . Thyroid disease Mother   . Cancer Father        Prostate  . Hypertension Brother   . Cerebral aneurysm Paternal Grandmother        Nonruptured    Vitals:   04/10/18 1101  BP: (!) 102/46  Pulse: 93  SpO2: 99%  Weight: 233 lb 12.8 oz (106.1 kg)   Wt Readings from Last 3 Encounters:  04/10/18 233 lb 12.8 oz (106.1 kg)  02/26/18 238 lb 3.2 oz (108 kg)  01/23/18 232 lb (105.2 kg)    PHYSICAL EXAM: General:  Well appearing. No resp difficulty HEENT: normal, anicteric Neck: supple. JVP 5-6. arotids 2+ bilat; no bruits. No lymphadenopathy or  thryomegaly appreciated. Cor: PMI laterally displaced. Regular rate & rhythm. No rubs, gallops or murmurs. Lungs: CTAB, normal effort. No wheeze. Abdomen: obese soft, nontender, nondistended. No hepatosplenomegaly. No bruits or masses. Good bowel sounds. Extremities: no cyanosis, clubbing, or rash. Trace ankle edema.  Neuro: alert &  orientedx3, cranial nerves grossly intact. moves all 4 extremities w/o difficulty. Affect pleasant   ECG: NSR 90, personally reviewed  ASSESSMENT & PLAN: 1. Chronic systolic HF, ICM - Echo 04/5992 LVEF 25-30%, Trivial MR, Normal RV, Mild TR, PA peak pressure 26 mm Hg - s/p Biotronik ICD - NYHA III symptoms. - Volume status OK on exam, though with with Trace to 1+ peripheral edema - Continue Entresto 24/26 mg BID. BP too low to titrate particularly with orthostatic symptoms at timesBMET today.  - Continue torsemide 50 mg BID - Continue spiro 25 mg daily.  - Continue toprol 25 mg BID - Continue digoxin 0.125 mg. Level today.  - CPX with mild limitation from HF and obesity.  - Reinforced fluid restriction to < 2 L daily, sodium restriction to less than 2000 mg daily, and the importance of daily weights.    2. CAD s/p anterior MI 1/18 with DES to LAD in [place] - No s/s of ischemia.    - continue ASA, b-blocker and statin   3. Obesity - Body mass index is 38.91 kg/m.  - likely contributing to exercise intolerance  4. Deconditioning - Pt activity <10% a day on ICD.  - Strongly encouraged exercise.  Shirley Friar, PA-C  11:05 AM  Patient seen and examined with the above-signed Advanced Practice Provider and/or Housestaff. I personally reviewed laboratory data, imaging studies and relevant notes. I independently examined the patient and formulated the important aspects of the plan. I have edited the note to reflect any of my changes or salient points. I have personally discussed the plan with the patient and/or family.  Overall stable NYHA II-III. CPX  testing reviewed with her and family. Mild HF limitation with primary limitation related to body habitus. Encouraged her to be more active and also work on weight loss with [place]. BP too soft to titrate meds. Has some mild LE edema but overall volume status not too bad. ICD interrogate personally. No VT/AF. Activity level low.   Glori Bickers, MD  10:26 PM

## 2018-05-14 DIAGNOSIS — R7309 Other abnormal glucose: Secondary | ICD-10-CM | POA: Diagnosis not present

## 2018-05-14 DIAGNOSIS — G43109 Migraine with aura, not intractable, without status migrainosus: Secondary | ICD-10-CM | POA: Diagnosis not present

## 2018-05-14 DIAGNOSIS — I10 Essential (primary) hypertension: Secondary | ICD-10-CM | POA: Diagnosis not present

## 2018-05-14 DIAGNOSIS — I5022 Chronic systolic (congestive) heart failure: Secondary | ICD-10-CM | POA: Diagnosis not present

## 2018-05-14 DIAGNOSIS — E782 Mixed hyperlipidemia: Secondary | ICD-10-CM | POA: Diagnosis not present

## 2018-06-18 ENCOUNTER — Encounter: Payer: Self-pay | Admitting: Cardiology

## 2018-06-18 ENCOUNTER — Ambulatory Visit (INDEPENDENT_AMBULATORY_CARE_PROVIDER_SITE_OTHER): Payer: BLUE CROSS/BLUE SHIELD | Admitting: *Deleted

## 2018-06-18 DIAGNOSIS — I5022 Chronic systolic (congestive) heart failure: Secondary | ICD-10-CM | POA: Diagnosis not present

## 2018-06-18 NOTE — Progress Notes (Signed)
Remote ICD transmission.   

## 2018-07-30 LAB — CUP PACEART REMOTE DEVICE CHECK
Brady Statistic AP VP Percent: 0 %
Brady Statistic AS VP Percent: 0 %
Brady Statistic RA Percent Paced: 1 %
Brady Statistic RV Percent Paced: 1 %
Date Time Interrogation Session: 20190912035327
HIGH POWER IMPEDANCE MEASURED VALUE: 93 Ohm
Implantable Lead Implant Date: 20180410
Implantable Lead Location: 753859
Implantable Lead Model: 377
Implantable Lead Serial Number: 49838890
Implantable Pulse Generator Implant Date: 20180410
Lead Channel Impedance Value: 519 Ohm
Lead Channel Impedance Value: 618 Ohm
Lead Channel Pacing Threshold Amplitude: 0.7 V
Lead Channel Setting Pacing Amplitude: 2 V
Lead Channel Setting Pacing Pulse Width: 0.4 ms
MDC IDC LEAD IMPLANT DT: 20180410
MDC IDC LEAD LOCATION: 753860
MDC IDC LEAD SERIAL: 49794726
MDC IDC MSMT BATTERY REMAINING PERCENTAGE: 100 %
MDC IDC MSMT BATTERY VOLTAGE: 3.11 V
MDC IDC MSMT LEADCHNL RA PACING THRESHOLD AMPLITUDE: 0.7 V
MDC IDC MSMT LEADCHNL RA PACING THRESHOLD PULSEWIDTH: 0.4 ms
MDC IDC MSMT LEADCHNL RV PACING THRESHOLD PULSEWIDTH: 0.4 ms
MDC IDC PG SERIAL: 60982098
MDC IDC SET LEADCHNL RV PACING AMPLITUDE: 2.5 V
MDC IDC SET LEADCHNL RV SENSING SENSITIVITY: 0.8 mV
MDC IDC STAT BRADY AP VS PERCENT: 0 %
MDC IDC STAT BRADY AS VS PERCENT: 98 %

## 2018-08-05 ENCOUNTER — Encounter (HOSPITAL_COMMUNITY): Payer: Self-pay | Admitting: Internal Medicine

## 2018-08-05 ENCOUNTER — Other Ambulatory Visit: Payer: Self-pay

## 2018-08-05 ENCOUNTER — Ambulatory Visit (HOSPITAL_COMMUNITY)
Admission: RE | Admit: 2018-08-05 | Discharge: 2018-08-05 | Disposition: A | Discharge status: Home / Self Care | Payer: BLUE CROSS/BLUE SHIELD | Source: Ambulatory Visit | Attending: Internal Medicine | Admitting: Internal Medicine

## 2018-08-05 VITALS — BP 117/63 | HR 89 | Wt 226.5 lb

## 2018-08-05 DIAGNOSIS — I2510 Atherosclerotic heart disease of native coronary artery without angina pectoris: Secondary | ICD-10-CM | POA: Diagnosis not present

## 2018-08-05 DIAGNOSIS — Z95810 Presence of automatic (implantable) cardiac defibrillator: Secondary | ICD-10-CM | POA: Insufficient documentation

## 2018-08-05 DIAGNOSIS — I110 Hypertensive heart disease with heart failure: Secondary | ICD-10-CM | POA: Insufficient documentation

## 2018-08-05 DIAGNOSIS — Z87891 Personal history of nicotine dependence: Secondary | ICD-10-CM | POA: Insufficient documentation

## 2018-08-05 DIAGNOSIS — R0683 Snoring: Secondary | ICD-10-CM

## 2018-08-05 DIAGNOSIS — I252 Old myocardial infarction: Secondary | ICD-10-CM | POA: Insufficient documentation

## 2018-08-05 DIAGNOSIS — Z7982 Long term (current) use of aspirin: Secondary | ICD-10-CM | POA: Insufficient documentation

## 2018-08-05 DIAGNOSIS — E785 Hyperlipidemia, unspecified: Secondary | ICD-10-CM | POA: Insufficient documentation

## 2018-08-05 DIAGNOSIS — Z95828 Presence of other vascular implants and grafts: Secondary | ICD-10-CM | POA: Diagnosis not present

## 2018-08-05 DIAGNOSIS — Z79899 Other long term (current) drug therapy: Secondary | ICD-10-CM | POA: Insufficient documentation

## 2018-08-05 DIAGNOSIS — I5022 Chronic systolic (congestive) heart failure: Secondary | ICD-10-CM | POA: Insufficient documentation

## 2018-08-05 DIAGNOSIS — R002 Palpitations: Secondary | ICD-10-CM | POA: Insufficient documentation

## 2018-08-05 DIAGNOSIS — Z6837 Body mass index (BMI) 37.0-37.9, adult: Secondary | ICD-10-CM | POA: Insufficient documentation

## 2018-08-05 LAB — COMPREHENSIVE METABOLIC PANEL
ALT: 50 U/L — AB (ref 0–44)
AST: 35 U/L (ref 15–41)
Albumin: 3.9 g/dL (ref 3.5–5.0)
Alkaline Phosphatase: 135 U/L — ABNORMAL HIGH (ref 38–126)
Anion gap: 12 (ref 5–15)
BILIRUBIN TOTAL: 0.9 mg/dL (ref 0.3–1.2)
BUN: 15 mg/dL (ref 6–20)
CALCIUM: 10 mg/dL (ref 8.9–10.3)
CO2: 29 mmol/L (ref 22–32)
Chloride: 98 mmol/L (ref 98–111)
Creatinine, Ser: 0.96 mg/dL (ref 0.44–1.00)
GFR calc non Af Amer: >60 mL/min (ref >60)
GLUCOSE: 100 mg/dL — AB (ref 70–99)
POTASSIUM: 4.2 mmol/L (ref 3.5–5.1)
Sodium: 139 mmol/L (ref 135–145)
Total Protein: 7.5 g/dL (ref 6.5–8.1)

## 2018-08-05 LAB — MAGNESIUM: Magnesium: 2.4 mg/dL (ref 1.7–2.4)

## 2018-08-05 MED ORDER — METOPROLOL SUCCINATE ER 50 MG PO TB24: 50.0000 mg | ORAL_TABLET | Freq: Two times a day (BID) | ORAL | 3 refills | Status: DC

## 2018-08-05 NOTE — Progress Notes (Signed)
Advanced Heart Failure Clinic Note   Referring Physician: Dr. Bettina Gavia  HPI:  Hermenia Fritcher is a 57 y.o. female a h/o morbid obesity, HTN, carotid artery dissection with stenting of left carotid due to possible FMD, CAD s/p anterior MI 1/18 (DES to LAD), systolic HF with EF 34-19% s/p Biotronik ICD who is referred by Dr. Bettina Gavia for assistance with management of her HF.  She presents today for follow up. She is here with her mother. She states that her mother gave her a stationary bike. Rides 2-3 miles. Takes about 10 mins. Tries to do it every day. Not using much resistance. Has lost 12 pounds in last 5 months. Cutting out carbs. Had occasional edema. Never takes extra lasix. Can walk through store without problem but gets SOB on stairs. Having frequent palpitations. Can last 2-3 days in a row. Feels like PVCs. No orthopnea or PND. Has vertigo and falls at times. Gets dizzy when she stands up     Echo 06/2017 EF 20-25% Echo 02/2018 LVEF 25-30%, Trivial MR, Normal RV, Mild TR, PA peak pressure 26 mm Hg  CPX 03/17/18 FVC 3.00 (87%), FEV1 2.97(88%), FEV1/FVC 79 (99%)  Peak VO2: 13.8 - When adjusted to the patient's ideal body weight of 142.2 lb (64.5 kg) the peak VO2 is 22.6 ml/kg VE/VCO2 slope: 38 OUES: 1.52 Peak RER: 1.11 Ventilatory Threshold: 11.8 VE/MVV:  95% O2pulse:  8 Interpretation: Mild limitation due to HF and obesity  Review of systems complete and found to be negative unless listed in HPI.    Past Medical History:  Diagnosis Date  . Ejection fraction < 50%    23% as of August 2018  . Headache(784.0)   . HTN (hypertension)   . Hyperlipidemia   . MI (myocardial infarction) ([place])   . Nonruptured cerebral aneurysm, internal carotid artery    Left side, stent placement (2009)  . Vertigo     Current Outpatient Medications  Medication Sig Dispense Refill  . aspirin 81 MG chewable tablet Chew 81 mg by mouth daily.    [name] atorvastatin (LIPITOR) 20 MG tablet Take 1 tablet (20  mg total) by mouth daily. 90 tablet 3  . digoxin (LANOXIN) 0.125 MG tablet Take 1 tablet (0.125 mg total) by mouth daily. 30 tablet 6  . HYDROcodone-acetaminophen (NORCO) 7.5-325 MG per tablet Take 1 tablet by mouth every 6 (six) hours as needed for moderate pain.    . metoprolol succinate (TOPROL-XL) 25 MG 24 hr tablet Take 1 tablet (25 mg total) by mouth 2 (two) times daily. Take an extra 12.5 mg mid day 180 tablet 3  . nitroGLYCERIN (NITROSTAT) 0.4 MG SL tablet Place 0.4 mg under the tongue every 5 (five) minutes as needed.     . phenazopyridine (PYRIDIUM) 97 MG tablet Take 97 mg by mouth 3 (three) times daily as needed for pain.    [name] Potassium Chloride ER 20 MEQ TBCR Take 1 tablet by mouth daily. 30 tablet 11  . sacubitril-valsartan (ENTRESTO) 24-26 MG Take 1 tablet by mouth 2 (two) times daily. 60 tablet 2  . spironolactone (ALDACTONE) 25 MG tablet Take 1 tablet (25 mg total) by mouth daily. 30 tablet 11  . ticagrelor (BRILINTA) 90 MG TABS tablet Take 1 tablet (90 mg total) by mouth 2 (two) times daily. 180 tablet 3  . torsemide (DEMADEX) 100 MG tablet Take 50 mg by mouth 2 (two) times daily.     No current facility-administered medications for this encounter.  Allergies  Allergen Reactions  . Clopidogrel Other (See Comments)    JOINT ACHES, JOINT POPS  . Tape Rash    PREFERS CLOTH OR NOTHING  . Plavix [Clopidogrel Bisulfate]       Social History   Socioeconomic History  . Marital status: Married    Spouse name: Not on file  . Number of children: Not on file  . Years of education: Not on file  . Highest education level: Not on file  Occupational History  . Not on file  Social Needs  . Financial resource strain: Not on file  . Food insecurity:    Worry: Not on file    Inability: Not on file  . Transportation needs:    Medical: Not on file    Non-medical: Not on file  Tobacco Use  . Smoking status: Former Smoker    Packs/day: 1.00    Years: 6.00    Pack years:  6.00    Types: Cigarettes  . Smokeless tobacco: Never Used  . Tobacco comment: Quit 30 years ago  Substance and Sexual Activity  . Alcohol use: Yes    Comment: Occasionally  . Drug use: No  . Sexual activity: Not on file  Lifestyle  . Physical activity:    Days per week: Not on file    Minutes per session: Not on file  . Stress: Not on file  Relationships  . Social connections:    Talks on phone: Not on file    Gets together: Not on file    Attends religious service: Not on file    Active member of club or organization: Not on file    Attends meetings of clubs or organizations: Not on file    Relationship status: Not on file  . Intimate partner violence:    Fear of current or ex partner: Not on file    Emotionally abused: Not on file    Physically abused: Not on file    Forced sexual activity: Not on file  Other Topics Concern  . 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        Family History  Problem Relation Age of Onset  . Thyroid disease Mother   . Cancer Father        Prostate  . Hypertension Brother   . Cerebral aneurysm Paternal Grandmother        Nonruptured    Vitals:   08/05/18 1022  BP: 117/63  Pulse: 89  SpO2: 100%  Weight: 102.7 kg (226 lb 8 oz)   Wt Readings from Last 3 Encounters:  08/05/18 102.7 kg (226 lb 8 oz)  04/10/18 106.1 kg (233 lb 12.8 oz)  02/26/18 108 kg (238 lb 3.2 oz)    PHYSICAL EXAM: General:  Well appearing. No resp difficulty HEENT: normal Neck: supple. JVP 5-6. Carotids 2+ bilat; no bruits. No lymphadenopathy or thryomegaly appreciated. Cor: PMI nondisplaced. Regular rate & rhythm. No rubs, gallops or murmurs. Lungs: clear Abdomen: obese  soft, nontender, nondistended. No hepatosplenomegaly. No bruits or masses. Good bowel sounds. Extremities: no cyanosis, clubbing, rash, edema Neuro: alert & orientedx3, cranial nerves grossly intact.  moves all 4 extremities w/o difficulty. Affect pleasant    ASSESSMENT & PLAN: 1. Chronic systolic HF, ICM - Echo 04/3784 LVEF 25-30%, Trivial MR, Normal RV, Mild TR, PA peak pressure 26 mm Hg - s/p Biotronik ICD - doing well. Stable NYHA  II-III symptoms. - Volume status looks good.  - Continue Entresto 24/26 mg BID. Has been hard to titrate due to frequent dizziness but I doubt this is orthostasis. More likely related to previous carotid stenting  - Continue torsemide 50 mg BID - Continue spiro 25 mg daily.  - Increase toprol 50 mg BID - Continue digoxin 0.125 mg. Level today.  - CPX 4/19 with mild limitation from HF and obesity.  - Reinforced fluid restriction to < 2 L daily, sodium restriction to less than 2000 mg daily, and the importance of daily weights.   - Stressed need for more activity  2. CAD s/p anterior MI 1/18 with DES to LAD in [place] - No s/s of ischemia - continue ASA, b-blocker and statin  - can stop Brilinta as > 1 year out from PCI  3. Obesity - Body mass index is 37.69 kg/m.  - likely contributing to exercise intolerance - reinforced need for weight loss  4. H/o carotid pseudoaneursym -  left ICA pseudoaneurysm at the cervical petrous portion s/p stent placement (05/2008, 10/2009), chronic HAs and vertigo - has residual vertigo and dizziness  5. Deconditioning - Pt activity <10% a day on ICD.  - Strongly encouraged exercise.  6. Palpitations - Sounds like PVCs - Place Zio patch - Refer for Sleep study   Glori Bickers, MD  10:59 AM

## 2018-08-05 NOTE — Patient Instructions (Signed)
Stop Brilinta  Increase Metoprolol to 50 mg Twice daily   Labs today  Your physician has recommended that you wear an event monitor. Event monitors are medical devices that record the heart's electrical activity. Doctors most often [place] these monitors to diagnose arrhythmias. Arrhythmias are problems with the speed or rhythm of the heartbeat. The monitor is a small, portable device. You can wear one while you do your normal daily activities. This is usually used to diagnose what is causing palpitations/syncope (passing out).  Your physician has recommended that you have a sleep study. This test records several body functions during sleep, including: brain activity, eye movement, oxygen and carbon dioxide blood levels, heart rate and rhythm, breathing rate and rhythm, the flow of air through your mouth and nose, snoring, body muscle movements, and chest and belly movement.  Your physician recommends that you schedule a follow-up appointment in: 3 months

## 2018-08-26 ENCOUNTER — Telehealth (HOSPITAL_COMMUNITY): Payer: Self-pay | Admitting: *Deleted

## 2018-08-26 DIAGNOSIS — R002 Palpitations: Secondary | ICD-10-CM | POA: Diagnosis not present

## 2018-08-26 NOTE — Telephone Encounter (Signed)
Ok to take aspirin as needed for headaches. Though I would prefer extra strength tylenol (2 tabs) for headaches.

## 2018-08-26 NOTE — Telephone Encounter (Signed)
Pt called stating she was taken off brilinta (per Dr.Bensimhon note stop Brilinta as >1 year out PCI) and was told to take aspirin 81mg  but she has started having bad headaches again and wants to know if she can take 325mg  of aspirin or any other medication the doctor recommends.   Message routed to [place] for advice.

## 2018-08-27 NOTE — Telephone Encounter (Signed)
Spoke to patient she is aware and agreeable. Stated she will make appt with neuro just because her headaches have been more frequent.

## 2018-09-09 DIAGNOSIS — E782 Mixed hyperlipidemia: Secondary | ICD-10-CM | POA: Diagnosis not present

## 2018-09-09 DIAGNOSIS — I5022 Chronic systolic (congestive) heart failure: Secondary | ICD-10-CM | POA: Diagnosis not present

## 2018-09-09 DIAGNOSIS — G43109 Migraine with aura, not intractable, without status migrainosus: Secondary | ICD-10-CM | POA: Diagnosis not present

## 2018-09-09 DIAGNOSIS — Z23 Encounter for immunization: Secondary | ICD-10-CM | POA: Diagnosis not present

## 2018-09-09 DIAGNOSIS — I10 Essential (primary) hypertension: Secondary | ICD-10-CM | POA: Diagnosis not present

## 2018-09-09 DIAGNOSIS — I773 Arterial fibromuscular dysplasia: Secondary | ICD-10-CM | POA: Diagnosis not present

## 2018-09-17 ENCOUNTER — Ambulatory Visit (INDEPENDENT_AMBULATORY_CARE_PROVIDER_SITE_OTHER): Payer: BLUE CROSS/BLUE SHIELD | Admitting: *Deleted

## 2018-09-17 DIAGNOSIS — R002 Palpitations: Secondary | ICD-10-CM

## 2018-09-17 DIAGNOSIS — I5022 Chronic systolic (congestive) heart failure: Secondary | ICD-10-CM | POA: Diagnosis not present

## 2018-09-17 NOTE — Progress Notes (Signed)
Remote ICD transmission.   

## 2018-09-20 ENCOUNTER — Other Ambulatory Visit (HOSPITAL_COMMUNITY): Payer: Self-pay | Admitting: Internal Medicine

## 2018-09-20 NOTE — Progress Notes (Addendum)
Cardiology Office Note:    Date:  09/21/2018   ID:  Allison White, DOB 02/25/1961, MRN [id number]  PCP:  Lillard Anes, MD  Cardiologist:  Shirlee More, MD    Referring MD: Lillard Anes,*    This is a date of my last office visit.  Enclosed you will find my office note.  She has coronary artery disease and sustained myocardial infarction as very severe cardiomyopathy ejection fraction 23% with ongoing symptoms of heart failure requiring referral to advanced heart failure clinic and has an implantable defibrillator.  I hope this information is beneficial to make a decision for disability.  ASSESSMENT:    1. Chronic systolic congestive heart failure ([place])   2. Hypertensive heart disease with heart failure ([place])   3. Coronary artery disease involving native coronary artery of native heart with angina pectoris ([place])   4. Presence of automatic (implantable) cardiac defibrillator   5. Other hyperlipidemia    PLAN:    In order of problems listed above: 1.      Compensated, she has no fluid overload ten-year current loop and distal diuretic along with beta-blocker low-dose Entresto limited by hypotension and digoxin which remarkably improve the quality of her life.  Strongly encouraged her lifestyle modification with weight loss and ongoing physical activity currently cycling 30 minutes daily.  She will continue to follow-up in advanced heart failure 2.       Stable tolerating Entresto 3.       Stable continue medical therapy 4.       Stable refer to EP for device management 5.       Stable continue statin we will check a CMP for liver function proBNP level lipid profile and digoxin level to exclude toxicity goal digoxin level less than 1 to avoid excess mortality when used with heart failure   Next appointment: 6 months with me continue to follow in advanced heart failure clinic   Medication Adjustments/Labs and Tests Ordered: Current medicines are reviewed at length  with the patient today.  Concerns regarding medicines are outlined above.  No orders of the defined types were placed in this encounter.  No orders of the defined types were placed in this encounter.   Chief Complaint  Patient presents with  . Congestive Heart Failure    History of Present Illness:    Allison White is a 57 y.o. female with a hx of Anterior-LAD STEMI 11/19/16 and Xience drug-eluting stent to the proximal LAD with angioplasty to the LAD-diagonal bifurcation , CAD,systolic CHF poorly tolerant of ACEI with hypotension , HTN with severelly reduced EF 23%  and  ICD  last seen by me 01/23/18.  Long-term monitor recently applied showed rare PVCs and bigeminy and no episodes of ventricular tachycardia.  She has been seen by advanced heart failure with stable New [place] Heart Association class II to class III symptoms and continued on low-dose Entresto as dizziness and hypotension have precluded up titration.  She had a cardiopulmonary exercise test was found to have deconditioning and has not had exercise program.  She also has had obesity and significant weight loss. Compliance with diet, lifestyle and medications: Yes  Overall she is improved she no exercises daily does housework tolerates her cardiac medications without symptomatic hypotension.  Things that her scale is broken at home and a new one is been ordered she has no edema orthopnea chest pain palpitation or syncope.  She exercises 30 minutes a day and a bike is lost weight  is compliant with sodium restriction.  Particular she had a marked improvement in her quality of her life associated with digoxin. Past Medical History:  Diagnosis Date  . Ejection fraction < 50%    23% as of August 2018  . Headache(784.0)   . HTN (hypertension)   . Hyperlipidemia   . MI (myocardial infarction) ([place])   . Nonruptured cerebral aneurysm, internal carotid artery    Left side, stent placement (2009)  . Vertigo     Past Surgical History:    Procedure Laterality Date  . ABDOMINAL HYSTERECTOMY    . CARDIAC DEFIBRILLATOR PLACEMENT    . CEREBRAL ANEURYSM REPAIR Left 2009  . CORONARY ANGIOPLASTY WITH STENT PLACEMENT    . PACEMAKER IMPLANT      Current Medications: Current Meds  Medication Sig  . aspirin 81 MG chewable tablet Chew 81 mg by mouth daily.  [name] atorvastatin (LIPITOR) 20 MG tablet Take 1 tablet (20 mg total) by mouth daily.  . digoxin (LANOXIN) 0.125 MG tablet Take 1 tablet (0.125 mg total) by mouth daily.  [name] HYDROcodone-acetaminophen (NORCO) 7.5-325 MG per tablet Take 1 tablet by mouth every 6 (six) hours as needed for moderate pain.  . metoprolol succinate (TOPROL-XL) 50 MG 24 hr tablet Take 1 tablet (50 mg total) by mouth 2 (two) times daily.  . nitroGLYCERIN (NITROSTAT) 0.4 MG SL tablet Place 0.4 mg under the tongue every 5 (five) minutes as needed.   . Potassium Chloride ER 20 MEQ TBCR Take 1 tablet by mouth daily.  . sacubitril-valsartan (ENTRESTO) 24-26 MG Take 1 tablet by mouth 2 (two) times daily.  [name] spironolactone (ALDACTONE) 25 MG tablet Take 1 tablet (25 mg total) by mouth daily.  [name] torsemide (DEMADEX) 100 MG tablet Take 50 mg by mouth 2 (two) times daily.     Allergies:   Clopidogrel; Tape; and Plavix [clopidogrel bisulfate]   Social History   Socioeconomic History  . Marital status: Married    Spouse name: Not on file  . Number of children: Not on file  . Years of education: Not on file  . Highest education level: Not on file  Occupational History  . Not on file  Social Needs  . Financial resource strain: Not on file  . Food insecurity:    Worry: Not on file    Inability: Not on file  . Transportation needs:    Medical: Not on file    Non-medical: Not on file  Tobacco Use  . Smoking status: Former Smoker    Packs/day: 1.00    Years: 6.00    Pack years: 6.00    Types: Cigarettes  . Smokeless tobacco: Never Used  . Tobacco comment: Quit 30 years ago  Substance and Sexual Activity  .  Alcohol use: Yes    Comment: Occasionally  . Drug use: No  . Sexual activity: Not on file  Lifestyle  . Physical activity:    Days per week: Not on file    Minutes per session: Not on file  . Stress: Not on file  Relationships  . Social connections:    Talks on phone: Not on file    Gets together: Not on file    Attends religious service: Not on file    Active member of club or organization: Not on file    Attends meetings of clubs or organizations: Not on file    Relationship status: Not on file  Other Topics Concern  . Not on file  Social History  Narrative   Working in Systems developer at [employer].  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       Family History: The patient's family history includes Cancer in her father; Cerebral aneurysm in her paternal grandmother; Hypertension in her brother; Thyroid disease in her mother. ROS:   Please see the history of present illness.    All other systems reviewed and are negative.  EKGs/Labs/Other Studies Reviewed:    The following studies were reviewed today:   Recent Labs: 01/23/2018: NT-Pro BNP 694 02/26/2018: B Natriuretic Peptide 77.4 08/05/2018: ALT 50; BUN 15; Creatinine, Ser 0.96; Magnesium 2.4; Potassium 4.2; Sodium 139  Recent Lipid Panel    Component Value Date/Time   CHOL (H) 06/11/2008 0825    227        ATP III CLASSIFICATION:  <200     mg/dL   Desirable  200-239  mg/dL   Borderline High  >=240    mg/dL   High   TRIG 113 06/11/2008 0825   HDL 56 06/11/2008 0825   CHOLHDL 4.1 06/11/2008 0825   VLDL 23 06/11/2008 0825   LDLCALC (H) 06/11/2008 0825    148        Total Cholesterol/HDL:CHD Risk Coronary Heart Disease Risk Table                     Men   Women  1/2 Average Risk   3.4   3.3    Physical Exam:    VS:  BP 98/68 (BP Location: Right Arm, Patient Position: Sitting, Cuff Size: Large)   Pulse 86   Ht 5\' 5"  (1.651 m)   Wt 227 lb (103 kg)   SpO2 98%   BMI 37.77 kg/m     Wt Readings from Last 3  Encounters:  09/21/18 227 lb (103 kg)  08/05/18 226 lb 8 oz (102.7 kg)  04/10/18 233 lb 12.8 oz (106.1 kg)     GEN:  Well nourished, well developed in no acute distress HEENT: Normal NECK: No JVD; No carotid bruits LYMPHATICS: No lymphadenopathy CARDIAC: RRR, no murmurs, rubs, gallops RESPIRATORY:  Clear to auscultation without rales, wheezing or rhonchi  ABDOMEN: Soft, non-tender, non-distended MUSCULOSKELETAL:  No edema; No deformity  SKIN: Warm and dry NEUROLOGIC:  Alert and oriented x 3 PSYCHIATRIC:  Normal affect    Signed, Shirlee More, MD  09/21/2018 9:04 AM    [place]

## 2018-09-21 ENCOUNTER — Ambulatory Visit: Payer: BLUE CROSS/BLUE SHIELD | Admitting: Cardiology

## 2018-09-21 VITALS — BP 98/68 | HR 86 | Ht 65.0 in | Wt 227.0 lb

## 2018-09-21 DIAGNOSIS — I110 Hypertensive heart disease with heart failure: Secondary | ICD-10-CM | POA: Diagnosis not present

## 2018-09-21 DIAGNOSIS — Z5181 Encounter for therapeutic drug level monitoring: Secondary | ICD-10-CM | POA: Diagnosis not present

## 2018-09-21 DIAGNOSIS — Z79899 Other long term (current) drug therapy: Secondary | ICD-10-CM | POA: Diagnosis not present

## 2018-09-21 DIAGNOSIS — I25119 Atherosclerotic heart disease of native coronary artery with unspecified angina pectoris: Secondary | ICD-10-CM | POA: Diagnosis not present

## 2018-09-21 DIAGNOSIS — E7849 Other hyperlipidemia: Secondary | ICD-10-CM

## 2018-09-21 DIAGNOSIS — Z95810 Presence of automatic (implantable) cardiac defibrillator: Secondary | ICD-10-CM | POA: Diagnosis not present

## 2018-09-21 DIAGNOSIS — I5022 Chronic systolic (congestive) heart failure: Secondary | ICD-10-CM

## 2018-09-21 LAB — LIPID PANEL
CHOL/HDL RATIO: 3.8 ratio (ref 0.0–4.4)
Cholesterol, Total: 169 mg/dL (ref 100–199)
HDL: 44 mg/dL (ref >39)
LDL CALC: 80 mg/dL (ref 0–99)
Triglycerides: 226 mg/dL — ABNORMAL HIGH (ref 0–149)
VLDL Cholesterol Cal: 45 mg/dL — ABNORMAL HIGH (ref 5–40)

## 2018-09-21 LAB — COMPREHENSIVE METABOLIC PANEL
ALT: 40 IU/L — AB (ref 0–32)
AST: 26 IU/L (ref 0–40)
Albumin/Globulin Ratio: 1.7 (ref 1.2–2.2)
Albumin: 4.5 g/dL (ref 3.5–5.5)
Alkaline Phosphatase: 153 IU/L — ABNORMAL HIGH (ref 39–117)
BUN / CREAT RATIO: 21 (ref 9–23)
BUN: 19 mg/dL (ref 6–24)
Bilirubin Total: 0.4 mg/dL (ref 0.0–1.2)
CO2: 28 mmol/L (ref 20–29)
Calcium: 10 mg/dL (ref 8.7–10.2)
Chloride: 96 mmol/L (ref 96–106)
Creatinine, Ser: 0.92 mg/dL (ref 0.57–1.00)
GFR, EST AFRICAN AMERICAN: 80 mL/min/{1.73_m2} (ref >59)
GFR, EST NON AFRICAN AMERICAN: 70 mL/min/{1.73_m2} (ref >59)
GLUCOSE: 103 mg/dL — AB (ref 65–99)
Globulin, Total: 2.7 g/dL (ref 1.5–4.5)
Potassium: 4.3 mmol/L (ref 3.5–5.2)
SODIUM: 141 mmol/L (ref 134–144)
TOTAL PROTEIN: 7.2 g/dL (ref 6.0–8.5)

## 2018-09-21 LAB — PRO B NATRIURETIC PEPTIDE: NT-PRO BNP: 448 pg/mL — AB (ref 0–287)

## 2018-09-21 LAB — DIGOXIN LEVEL: Digoxin, Serum: 0.5 ng/mL (ref 0.5–0.9)

## 2018-09-21 MED ORDER — SPIRONOLACTONE 25 MG PO TABS: 25.0000 mg | ORAL_TABLET | Freq: Every day | ORAL | 6 refills | Status: DC

## 2018-09-21 MED ORDER — TORSEMIDE 100 MG PO TABS: 50.0000 mg | ORAL_TABLET | Freq: Two times a day (BID) | ORAL | 6 refills | Status: DC

## 2018-09-21 MED ORDER — DIGOXIN 125 MCG PO TABS: 0.1250 mg | ORAL_TABLET | Freq: Every day | ORAL | 6 refills | Status: DC

## 2018-09-21 NOTE — Patient Instructions (Addendum)
Medication Instructions:  Your physician recommends that you continue on your current medications as directed. Please refer to the Current Medication list given to you today.  If you need a refill on your cardiac medications before your next appointment, please call your pharmacy.   Lab work: Your physician recommends that you return for lab work today: CMP, lipid panel, ProBNP, digoxin level.   If you have labs (blood work) drawn today and your tests are completely normal, you will receive your results only by: [name] MyChart Message (if you have MyChart) OR . A paper copy in the mail If you have any lab test that is abnormal or we need to change your treatment, we will call you to review the results.  Testing/Procedures: You had been referred to see an electrophysiology, Dr. Curt Bears. This appointment will be scheduled at the front desk when you check out.   Follow-Up: At [facility], you and your health needs are our priority.  As part of our continuing mission to provide you with exceptional heart care, we have created designated Provider Care Teams.  These Care Teams include your primary Cardiologist (physician) and Advanced Practice Providers (APPs -  Physician Assistants and Nurse Practitioners) who all work together to provide you with the care you need, when you need it. You will need a follow up appointment in 6 months.  Please call our office 2 months in advance to schedule this appointment.

## 2018-09-24 DIAGNOSIS — Z1211 Encounter for screening for malignant neoplasm of colon: Secondary | ICD-10-CM | POA: Diagnosis not present

## 2018-09-24 DIAGNOSIS — Z1212 Encounter for screening for malignant neoplasm of rectum: Secondary | ICD-10-CM | POA: Diagnosis not present

## 2018-09-24 LAB — COLOGUARD: Cologuard: NEGATIVE

## 2018-09-25 ENCOUNTER — Encounter: Payer: Self-pay | Admitting: Cardiology

## 2018-10-28 ENCOUNTER — Encounter: Payer: Self-pay | Admitting: *Deleted

## 2018-11-02 ENCOUNTER — Ambulatory Visit (INDEPENDENT_AMBULATORY_CARE_PROVIDER_SITE_OTHER): Payer: BLUE CROSS/BLUE SHIELD | Admitting: Cardiology

## 2018-11-02 ENCOUNTER — Encounter: Payer: Self-pay | Admitting: Cardiology

## 2018-11-02 VITALS — BP 130/80 | HR 81 | Ht 65.0 in | Wt 223.0 lb

## 2018-11-02 DIAGNOSIS — I10 Essential (primary) hypertension: Secondary | ICD-10-CM | POA: Diagnosis not present

## 2018-11-02 DIAGNOSIS — I255 Ischemic cardiomyopathy: Secondary | ICD-10-CM | POA: Diagnosis not present

## 2018-11-02 DIAGNOSIS — I5022 Chronic systolic (congestive) heart failure: Secondary | ICD-10-CM

## 2018-11-02 DIAGNOSIS — I2510 Atherosclerotic heart disease of native coronary artery without angina pectoris: Secondary | ICD-10-CM

## 2018-11-02 DIAGNOSIS — E7849 Other hyperlipidemia: Secondary | ICD-10-CM

## 2018-11-02 NOTE — Patient Instructions (Signed)
Medication Instructions:  Your physician recommends that you continue on your current medications as directed. Please refer to the Current Medication list given to you today.  *If you need a refill on your cardiac medications before your next appointment, please call your pharmacy*  Labwork: None ordered  Testing/Procedures: None ordered  Follow-Up: Remote monitoring is used to monitor your Pacemaker or ICD from home. This monitoring reduces the number of office visits required to check your device to one time per year. It allows [place] to keep an eye on the functioning of your device to ensure it is working properly. You are scheduled for a device check from home on 12/17/2018. You may send your transmission at any time that day. If you have a wireless device, the transmission will be sent automatically. After your physician reviews your transmission, you will receive a postcard with your next transmission date.  Your physician wants you to follow-up in: 1 year with Dr. Curt Bears.  You will receive a reminder letter in the mail two months in advance. If you don't receive a letter, please call our office to schedule the follow-up appointment.  Thank you for choosing [facility]!!   Trinidad Curet, RN [phone]  Any Other Special Instructions Will Be Listed Below (If Applicable).

## 2018-11-02 NOTE — Progress Notes (Signed)
Electrophysiology Office Note   Date:  11/02/2018   ID:  Allison White, DOB 06/17/1961, MRN [id number]  PCP:  Lillard Anes, MD  Cardiologist:  Bettina Gavia Primary Electrophysiologist:  Mccall Lomax Meredith Leeds, MD    No chief complaint on file.    History of Present Illness: Allison White is a 57 y.o. female who is being seen today for the evaluation of systolic heart failure at the request of Lillard Anes,*. Presenting today for electrophysiology evaluation.  She has a history of chronic systolic heart failure, coronary disease, hyperlipidemia.  She has a Biotronik ICD in place.  She had an anterior STEMI 11/19/16 and had drug-eluting stent placed to the LAD diagonal bifurcation.  Her ICD was implanted around that time.  Today, denies symptoms of palpitations, chest pain, shortness of breath, orthopnea, PND, lower extremity edema, claudication, dizziness, presyncope, syncope, bleeding, or neurologic sequela. The patient is tolerating medications without difficulties.  Overall she is feeling well.  She is able to do most of her daily activities, though she is restricted with shortness of breath.  Prior to Thanksgiving, she was using an exercise bike, but with family in town they had to put it in a separate room.  She is getting get it back out and start using it again.  She has been exerting herself doing more housework and shopping for the holidays.   Past Medical History:  Diagnosis Date  . Ejection fraction < 50%    23% as of August 2018  . Headache(784.0)   . HTN (hypertension)   . Hyperlipidemia   . MI (myocardial infarction) ([place])   . Nonruptured cerebral aneurysm, internal carotid artery    Left side, stent placement (2009)  . Vertigo    Past Surgical History:  Procedure Laterality Date  . ABDOMINAL HYSTERECTOMY    . CARDIAC DEFIBRILLATOR PLACEMENT    . CEREBRAL ANEURYSM REPAIR Left 2009  . CORONARY ANGIOPLASTY WITH STENT PLACEMENT    . PACEMAKER IMPLANT        Current Outpatient Medications  Medication Sig Dispense Refill  . aspirin 81 MG chewable tablet Chew 81 mg by mouth daily.    [name] atorvastatin (LIPITOR) 20 MG tablet Take 1 tablet (20 mg total) by mouth daily. 90 tablet 3  . digoxin (LANOXIN) 0.125 MG tablet TAKE 1 TABLET BY MOUTH DAILY 30 tablet 5  . digoxin (LANOXIN) 0.125 MG tablet Take 1 tablet (0.125 mg total) by mouth daily. 30 tablet 6  . HYDROcodone-acetaminophen (NORCO) 7.5-325 MG per tablet Take 1 tablet by mouth every 6 (six) hours as needed for moderate pain.    . metoprolol succinate (TOPROL-XL) 50 MG 24 hr tablet Take 1 tablet (50 mg total) by mouth 2 (two) times daily. 180 tablet 3  . nitroGLYCERIN (NITROSTAT) 0.4 MG SL tablet Place 0.4 mg under the tongue every 5 (five) minutes as needed.     . Potassium Chloride ER 20 MEQ TBCR Take 1 tablet by mouth daily. 30 tablet 11  . sacubitril-valsartan (ENTRESTO) 24-26 MG Take 1 tablet by mouth 2 (two) times daily. 60 tablet 2  . spironolactone (ALDACTONE) 25 MG tablet Take 1 tablet (25 mg total) by mouth daily. 30 tablet 6  . torsemide (DEMADEX) 100 MG tablet Take 0.5 tablets (50 mg total) by mouth 2 (two) times daily. 30 tablet 6   No current facility-administered medications for this visit.     Allergies:   Clopidogrel; Tape; and Plavix [clopidogrel bisulfate]   Social History:  The  patient  reports that she has quit smoking. Her smoking use included cigarettes. She has a 6.00 pack-year smoking history. She has never used smokeless tobacco. She reports current alcohol use. She reports that she does not use drugs.   Family History:  The patient's family history includes Cerebral aneurysm in her paternal grandmother; Hypertension in her brother; Prostate cancer in her father; Thyroid disease in her mother.   ROS:  Please see the history of present illness.   Otherwise, review of systems is positive for none.   All other systems are reviewed and negative.   PHYSICAL EXAM: VS:   BP 130/80   Pulse 81   Ht 5\' 5"  (1.651 m)   Wt 223 lb (101.2 kg)   BMI 37.11 kg/m  , BMI Body mass index is 37.11 kg/m. GEN: Well nourished, well developed, in no acute distress  HEENT: normal  Neck: no JVD, carotid bruits, or masses Cardiac: RRR; no murmurs, rubs, or gallops,no edema  Respiratory:  clear to auscultation bilaterally, normal work of breathing GI: soft, nontender, nondistended, + BS MS: no deformity or atrophy  Skin: warm and dry, device site well healed Neuro:  Strength and sensation are intact Psych: euthymic mood, full affect  EKG:  EKG is ordered today. Personal review of the ekg ordered shows sinus rhythm, septal and lateral infarct, rate 81  Personal review of the device interrogation today. Results in [place]: 02/26/2018: B Natriuretic Peptide 77.4 08/05/2018: Magnesium 2.4 09/21/2018: ALT 40; BUN 19; Creatinine, Ser 0.92; NT-Pro BNP 448; Potassium 4.3; Sodium 141    Lipid Panel     Component Value Date/Time   CHOL 169 09/21/2018 0920   TRIG 226 (H) 09/21/2018 0920   HDL 44 09/21/2018 0920   CHOLHDL 3.8 09/21/2018 0920   CHOLHDL 4.1 06/11/2008 0825   VLDL 23 06/11/2008 0825   LDLCALC 80 09/21/2018 0920     Wt Readings from Last 3 Encounters:  11/02/18 223 lb (101.2 kg)  09/21/18 227 lb (103 kg)  08/05/18 226 lb 8 oz (102.7 kg)      Other studies Reviewed: Additional studies/ records that were reviewed today include: TTE 03/17/18 Review of the above records today demonstrates:  - Left ventricle: The cavity size was normal. Systolic function was   severely reduced. The estimated ejection fraction was in the   range of 25% to 30%. Diffuse hypokinesis. The study is not   technically sufficient to allow evaluation of LV diastolic   function. - Aortic valve: Transvalvular velocity was within the normal range.   There was no stenosis. There was no regurgitation. - Mitral valve: Transvalvular velocity was within the normal range.    There was no evidence for stenosis. There was trivial   regurgitation. - Right ventricle: The cavity size was normal. Wall thickness was   normal. Systolic function was normal. - Atrial septum: No defect or patent foramen ovale was identified   by color flow Doppler. - Tricuspid valve: There was mild regurgitation. - Pulmonary arteries: Systolic pressure was within the normal   range. PA peak pressure: 26 mm Hg (S).   ASSESSMENT AND PLAN:  1.  Chronic systolic heart failure due to  ischemic cardiomyopathy: This post Biotronik ICD.  Is currently on optimal medical therapy with Entresto, Aldactone, and Toprol-XL.  Device functioning appropriately.  No changes at this time.  2.  Hypertension: Well-controlled today.  No changes.  3.  Coronary artery disease: Feeling well without chest pain.  No changes.  4.  Hyperlipidemia: Continue atorvastatin  Current medicines are reviewed at length with the patient today.   The patient does not have concerns regarding her medicines.  The following changes were made today:  none  Labs/ tests ordered today include:  Orders Placed This Encounter  Procedures  . EKG 12-Lead     Disposition:   FU with Tannor Pyon 1 year  Signed, Daivion Pape Meredith Leeds, MD  11/02/2018 4:14 PM     [address] [place] [address] [phone] (office) [phone] (fax)

## 2018-11-03 ENCOUNTER — Ambulatory Visit (HOSPITAL_COMMUNITY)
Admission: RE | Admit: 2018-11-03 | Discharge: 2018-11-03 | Disposition: A | Discharge status: Home / Self Care | Payer: BLUE CROSS/BLUE SHIELD | Source: Ambulatory Visit | Attending: Internal Medicine | Admitting: Internal Medicine

## 2018-11-03 VITALS — BP 114/72 | HR 86 | Wt 224.6 lb

## 2018-11-03 DIAGNOSIS — Z955 Presence of coronary angioplasty implant and graft: Secondary | ICD-10-CM | POA: Insufficient documentation

## 2018-11-03 DIAGNOSIS — R002 Palpitations: Secondary | ICD-10-CM | POA: Diagnosis not present

## 2018-11-03 DIAGNOSIS — Z8679 Personal history of other diseases of the circulatory system: Secondary | ICD-10-CM | POA: Insufficient documentation

## 2018-11-03 DIAGNOSIS — Z79899 Other long term (current) drug therapy: Secondary | ICD-10-CM | POA: Diagnosis not present

## 2018-11-03 DIAGNOSIS — I110 Hypertensive heart disease with heart failure: Secondary | ICD-10-CM | POA: Insufficient documentation

## 2018-11-03 DIAGNOSIS — I252 Old myocardial infarction: Secondary | ICD-10-CM | POA: Diagnosis not present

## 2018-11-03 DIAGNOSIS — Z7982 Long term (current) use of aspirin: Secondary | ICD-10-CM | POA: Insufficient documentation

## 2018-11-03 DIAGNOSIS — I5022 Chronic systolic (congestive) heart failure: Secondary | ICD-10-CM | POA: Insufficient documentation

## 2018-11-03 DIAGNOSIS — Z95810 Presence of automatic (implantable) cardiac defibrillator: Secondary | ICD-10-CM | POA: Insufficient documentation

## 2018-11-03 DIAGNOSIS — R42 Dizziness and giddiness: Secondary | ICD-10-CM | POA: Insufficient documentation

## 2018-11-03 DIAGNOSIS — Z8249 Family history of ischemic heart disease and other diseases of the circulatory system: Secondary | ICD-10-CM | POA: Diagnosis not present

## 2018-11-03 DIAGNOSIS — Z87891 Personal history of nicotine dependence: Secondary | ICD-10-CM | POA: Diagnosis not present

## 2018-11-03 DIAGNOSIS — I2510 Atherosclerotic heart disease of native coronary artery without angina pectoris: Secondary | ICD-10-CM | POA: Diagnosis not present

## 2018-11-03 DIAGNOSIS — E785 Hyperlipidemia, unspecified: Secondary | ICD-10-CM | POA: Insufficient documentation

## 2018-11-03 DIAGNOSIS — Z6837 Body mass index (BMI) 37.0-37.9, adult: Secondary | ICD-10-CM | POA: Diagnosis not present

## 2018-11-03 LAB — BASIC METABOLIC PANEL
ANION GAP: 13 (ref 5–15)
BUN: 15 mg/dL (ref 6–20)
CO2: 27 mmol/L (ref 22–32)
Calcium: 9.7 mg/dL (ref 8.9–10.3)
Chloride: 99 mmol/L (ref 98–111)
Creatinine, Ser: 1.07 mg/dL — ABNORMAL HIGH (ref 0.44–1.00)
GFR calc Af Amer: >60 mL/min (ref >60)
GFR calc non Af Amer: 58 mL/min — ABNORMAL LOW (ref >60)
GLUCOSE: 109 mg/dL — AB (ref 70–99)
POTASSIUM: 3.9 mmol/L (ref 3.5–5.1)
Sodium: 139 mmol/L (ref 135–145)

## 2018-11-03 LAB — BRAIN NATRIURETIC PEPTIDE: B Natriuretic Peptide: 118.8 pg/mL — ABNORMAL HIGH (ref 0.0–100.0)

## 2018-11-03 MED ORDER — SACUBITRIL-VALSARTAN 49-51 MG PO TABS: 1.0000 | ORAL_TABLET | Freq: Two times a day (BID) | ORAL | 6 refills | Status: DC

## 2018-11-03 NOTE — Progress Notes (Addendum)
Advanced Heart Failure Clinic Note   Referring Physician: Dr. Bettina Gavia  HPI:  Allison White is a 57 y.o. female a h/o morbid obesity, HTN, carotid artery dissection with stenting of left carotid due to possible FMD, CAD s/p anterior MI 1/18 (DES to LAD), systolic HF with EF 67-12% s/p Biotronik ICD who is referred by Dr. Bettina Gavia for assistance with management of her HF.  She presents today for follow up. She is here with her mother. Feeling pretty good. Her mother bought her a stationary bike and FitBit. Has not been riding the stationary bike recently but doing a lot more shopping. Can do almost anything she needs to do without stopping. The other day got fatigued but this was unusual. Was mildly dizzy. Was also swelling and went hoe a took a torsemide. Now better. No CP, orthopnea or PND. Averaging at least 3,000 steps per day. Palpitations nearly resolved with Toprol. Saw Dr. Curt Bears yesterday and ICD working well. Mild pacing at night. No snoring at night. Watching her salt closely.   Monitor 10/19 1. Sinus rhythm 2. Rare PVCs and bigeminy (< 1.0%) 3. Two patient-triggered events associated with isolated PVCs.  4. No high-grade arrhythmias    Echo 06/2017 EF 20-25% Echo 02/2018 LVEF 25-30%, Trivial MR, Normal RV, Mild TR, PA peak pressure 26 mm Hg  CPX 03/17/18 FVC 3.00 (87%), FEV1 2.97(88%), FEV1/FVC 79 (99%)  Peak VO2: 13.8 - When adjusted to the patient's ideal body weight of 142.2 lb (64.5 kg) the peak VO2 is 22.6 ml/kg VE/VCO2 slope: 38 OUES: 1.52 Peak RER: 1.11 Ventilatory Threshold: 11.8 VE/MVV:  95% O2pulse:  8 Interpretation: Mild limitation due to HF and obesity  Review of systems complete and found to be negative unless listed in HPI.    Past Medical History:  Diagnosis Date  . Ejection fraction < 50%    23% as of August 2018  . Headache(784.0)   . HTN (hypertension)   . Hyperlipidemia   . MI (myocardial infarction) ([place])   . Nonruptured cerebral aneurysm,  internal carotid artery    Left side, stent placement (2009)  . Vertigo     Current Outpatient Medications  Medication Sig Dispense Refill  . aspirin 81 MG chewable tablet Chew 81 mg by mouth daily.    [name] atorvastatin (LIPITOR) 20 MG tablet Take 1 tablet (20 mg total) by mouth daily. 90 tablet 3  . digoxin (LANOXIN) 0.125 MG tablet Take 1 tablet (0.125 mg total) by mouth daily. 30 tablet 6  . HYDROcodone-acetaminophen (NORCO) 7.5-325 MG per tablet Take 1 tablet by mouth every 6 (six) hours as needed for moderate pain.    . metoprolol succinate (TOPROL-XL) 50 MG 24 hr tablet Take 1 tablet (50 mg total) by mouth 2 (two) times daily. 180 tablet 3  . nitroGLYCERIN (NITROSTAT) 0.4 MG SL tablet Place 0.4 mg under the tongue every 5 (five) minutes as needed.     . Potassium Chloride ER 20 MEQ TBCR Take 1 tablet by mouth daily. 30 tablet 11  . sacubitril-valsartan (ENTRESTO) 24-26 MG Take 1 tablet by mouth 2 (two) times daily. 60 tablet 2  . spironolactone (ALDACTONE) 25 MG tablet Take 1 tablet (25 mg total) by mouth daily. 30 tablet 6  . torsemide (DEMADEX) 100 MG tablet Take 0.5 tablets (50 mg total) by mouth 2 (two) times daily. 30 tablet 6   No current facility-administered medications for this encounter.     Allergies  Allergen Reactions  . Clopidogrel Other (See Comments)  JOINT ACHES, JOINT POPS  . Tape Rash    PREFERS CLOTH OR NOTHING  . Plavix [Clopidogrel Bisulfate]       Social History   Socioeconomic History  . Marital status: Married    Spouse name: Not on file  . Number of children: Not on file  . Years of education: Not on file  . Highest education level: Not on file  Occupational History  . Not on file  Social Needs  . Financial resource strain: Not on file  . Food insecurity:    Worry: Not on file    Inability: Not on file  . Transportation needs:    Medical: Not on file    Non-medical: Not on file  Tobacco Use  . Smoking status: Former Smoker     Packs/day: 1.00    Years: 6.00    Pack years: 6.00    Types: Cigarettes  . Smokeless tobacco: Never Used  . Tobacco comment: Quit 30 years ago  Substance and Sexual Activity  . Alcohol use: Yes    Comment: Occasionally  . Drug use: No  . Sexual activity: Not on file  Lifestyle  . Physical activity:    Days per week: Not on file    Minutes per session: Not on file  . Stress: Not on file  Relationships  . Social connections:    Talks on phone: Not on file    Gets together: Not on file    Attends religious service: Not on file    Active member of club or organization: Not on file    Attends meetings of clubs or organizations: Not on file    Relationship status: Not on file  . Intimate partner violence:    Fear of current or ex partner: Not on file    Emotionally abused: Not on file    Physically abused: Not on file    Forced sexual activity: Not on file  Other Topics Concern  . 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        Family History  Problem Relation Age of Onset  . Thyroid disease Mother   . Prostate cancer Father        Prostate  . Hypertension Brother   . Cerebral aneurysm Paternal Grandmother        Nonruptured    Vitals:   11/03/18 1013  BP: 114/72  Pulse: 86  SpO2: 98%  Weight: 101.9 kg (224 lb 9.6 oz)   Wt Readings from Last 3 Encounters:  11/03/18 101.9 kg (224 lb 9.6 oz)  11/02/18 101.2 kg (223 lb)  09/21/18 103 kg (227 lb)    PHYSICAL EXAM: General:  Well appearing. No resp difficulty HEENT: normal Neck: supple. no JVD. Carotids 2+ bilat; no bruits. No lymphadenopathy or thryomegaly appreciated. Cor: PMI nondisplaced. Regular rate & rhythm. No rubs, gallops or murmurs. Lungs: clear Abdomen: obese soft, nontender, nondistended. No hepatosplenomegaly. No bruits or masses. Good bowel sounds. Extremities: no cyanosis, clubbing, rash, edema Neuro: alert &  orientedx3, cranial nerves grossly intact. moves all 4 extremities w/o difficulty. Affect pleasant    ASSESSMENT & PLAN: 1. Chronic systolic HF, ICM - Echo 07/6044 LVEF 25-30%, Trivial MR, Normal RV, Mild TR, PA peak pressure 26 mm Hg - s/p Biotronik ICD - doing well. Improved NYHA II symptoms. - Volume status looks good.  - Increase Entresto 49/51 mg BID  - Continue  torsemide 50 mg BID. Can cut afternoon dose back to 25mg  as needed - Continue spiro 25 mg daily.  - Continue toprol 50 mg BID - Continue digoxin 0.125 mg. Level 0.5 in 11/19 - CPX 4/19 with mild limitation from HF and obesity.  - Reinforced fluid restriction to < 2 L daily, sodium restriction to less than 2000 mg daily, and the importance of daily weights.   - Stressed need for more activity - RTC in 3 months with echo  2. CAD s/p anterior MI 1/18 with DES to LAD in [place] - No s/s of ischemia - continue ASA, b-blocker and statin   3. Obesity - Body mass index is 37.38 kg/m.  - continue exercise and weight loss activity  4. 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 Palpitations - Zio patch looked good - Referred for Sleep study but she cancelled due to cost. Husband said she doesn't snore.    Glori Bickers, MD  10:58 AM

## 2018-11-03 NOTE — Addendum Note (Signed)
Encounter addended by: Valeda Malm, RN on: 11/03/2018 11:17 AM  Actions taken: Order list changed, Diagnosis association updated, Clinical Note Signed, Charge Capture section accepted

## 2018-11-03 NOTE — Patient Instructions (Signed)
INCREASE Entresto 49/51mg  (1 tab) twice a day  Labs today We will only contact you if something comes back abnormal or we need to make some changes. Otherwise no news is good news!  Your physician has requested that you have an echocardiogram. Echocardiography is a painless test that uses sound waves to create images of your heart. It provides your doctor with information about the size and shape of your heart and how well your heart's chambers and valves are working. This procedure takes approximately one hour. There are no restrictions for this procedure.  Your physician recommends that you schedule a follow-up appointment in: 3 months with an ECHO

## 2018-11-04 ENCOUNTER — Encounter (HOSPITAL_COMMUNITY): Payer: BLUE CROSS/BLUE SHIELD | Admitting: Internal Medicine

## 2018-11-15 ENCOUNTER — Encounter (HOSPITAL_BASED_OUTPATIENT_CLINIC_OR_DEPARTMENT_OTHER): Payer: BLUE CROSS/BLUE SHIELD

## 2018-11-17 LAB — CUP PACEART REMOTE DEVICE CHECK
Date Time Interrogation Session: 20191231093536
Implantable Lead Implant Date: 20180410
Implantable Lead Location: 753859
Implantable Lead Serial Number: 49794726
Implantable Pulse Generator Implant Date: 20180410
MDC IDC LEAD IMPLANT DT: 20180410
MDC IDC LEAD LOCATION: 753860
MDC IDC LEAD SERIAL: 49838890
MDC IDC PG SERIAL: 60982098
Pulse Gen Model: 404622

## 2018-12-08 ENCOUNTER — Other Ambulatory Visit: Payer: Self-pay | Admitting: Cardiology

## 2018-12-17 ENCOUNTER — Ambulatory Visit (INDEPENDENT_AMBULATORY_CARE_PROVIDER_SITE_OTHER): Payer: BLUE CROSS/BLUE SHIELD

## 2018-12-17 DIAGNOSIS — I255 Ischemic cardiomyopathy: Secondary | ICD-10-CM | POA: Diagnosis not present

## 2018-12-18 ENCOUNTER — Telehealth (HOSPITAL_COMMUNITY): Payer: Self-pay | Admitting: *Deleted

## 2018-12-18 LAB — CUP PACEART REMOTE DEVICE CHECK
Implantable Lead Implant Date: 20180410
Implantable Lead Location: 753859
Implantable Lead Model: 377
Implantable Lead Serial Number: 49794726
Implantable Lead Serial Number: 49838890
Implantable Pulse Generator Implant Date: 20180410
MDC IDC LEAD IMPLANT DT: 20180410
MDC IDC LEAD LOCATION: 753860
MDC IDC PG SERIAL: 60982098
MDC IDC SESS DTM: 20200131191711

## 2018-12-18 MED ORDER — SACUBITRIL-VALSARTAN 24-26 MG PO TABS: 1.0000 | ORAL_TABLET | Freq: Two times a day (BID) | ORAL | 3 refills | Status: DC

## 2018-12-18 NOTE — Telephone Encounter (Signed)
Pt called to report low bp of 83/40 and just "feeling bad" since Entresto increase to 49/51mg  bid.  Per Amy ok to decrease Entresto to 24/26mg  bid. Pt aware and agreeable with plan.

## 2018-12-25 NOTE — Progress Notes (Signed)
Remote ICD transmission.   

## 2018-12-28 ENCOUNTER — Encounter: Payer: Self-pay | Admitting: Cardiology

## 2019-01-13 DIAGNOSIS — I773 Arterial fibromuscular dysplasia: Secondary | ICD-10-CM | POA: Diagnosis not present

## 2019-01-13 DIAGNOSIS — I10 Essential (primary) hypertension: Secondary | ICD-10-CM | POA: Diagnosis not present

## 2019-01-13 DIAGNOSIS — I2510 Atherosclerotic heart disease of native coronary artery without angina pectoris: Secondary | ICD-10-CM | POA: Diagnosis not present

## 2019-01-13 DIAGNOSIS — E782 Mixed hyperlipidemia: Secondary | ICD-10-CM | POA: Diagnosis not present

## 2019-01-18 ENCOUNTER — Other Ambulatory Visit: Payer: Self-pay | Admitting: Cardiology

## 2019-01-20 ENCOUNTER — Other Ambulatory Visit: Payer: Self-pay

## 2019-01-20 MED ORDER — DIGOXIN 125 MCG PO TABS: 0.1250 mg | ORAL_TABLET | Freq: Every day | ORAL | 2 refills | Status: DC

## 2019-01-25 ENCOUNTER — Encounter (HOSPITAL_COMMUNITY): Payer: BLUE CROSS/BLUE SHIELD | Admitting: Internal Medicine

## 2019-01-25 ENCOUNTER — Other Ambulatory Visit (HOSPITAL_COMMUNITY): Payer: BLUE CROSS/BLUE SHIELD

## 2019-02-03 ENCOUNTER — Encounter (HOSPITAL_COMMUNITY): Payer: BLUE CROSS/BLUE SHIELD | Admitting: Internal Medicine

## 2019-02-03 ENCOUNTER — Ambulatory Visit (HOSPITAL_COMMUNITY): Payer: BLUE CROSS/BLUE SHIELD

## 2019-02-15 ENCOUNTER — Other Ambulatory Visit: Payer: Self-pay | Admitting: Cardiology

## 2019-02-23 ENCOUNTER — Telehealth (HOSPITAL_COMMUNITY): Payer: Self-pay

## 2019-02-23 NOTE — Telephone Encounter (Signed)
Faxed medical records for patient to lawyer. Pt has upcoming disability hearing. Unable to send in updated physical assessment as requested because pt's march appointment was moved to April due to the covid-19 pandemic.

## 2019-03-14 ENCOUNTER — Encounter (HOSPITAL_COMMUNITY): Payer: Self-pay

## 2019-03-15 ENCOUNTER — Telehealth (HOSPITAL_COMMUNITY): Payer: Self-pay | Admitting: Vascular Surgery

## 2019-03-15 NOTE — Telephone Encounter (Signed)
Per Dr Haroldine Laws will arrange televisit for tomorrow

## 2019-03-15 NOTE — Telephone Encounter (Signed)
Called pt to resch/tele visit or wait list pt, due to Covid, pt did not want to cancel/change  appt at this time because she emailed DB about swelling, she wants to wait to hear because she feels that he may want to see her in office

## 2019-03-16 ENCOUNTER — Other Ambulatory Visit: Payer: Self-pay

## 2019-03-16 ENCOUNTER — Ambulatory Visit (HOSPITAL_COMMUNITY)
Admission: RE | Admit: 2019-03-16 | Discharge: 2019-03-16 | Disposition: A | Discharge status: Home / Self Care | Payer: Self-pay | Source: Ambulatory Visit | Attending: Internal Medicine | Admitting: Internal Medicine

## 2019-03-16 ENCOUNTER — Encounter (HOSPITAL_COMMUNITY): Payer: Self-pay | Admitting: *Deleted

## 2019-03-16 DIAGNOSIS — I5022 Chronic systolic (congestive) heart failure: Secondary | ICD-10-CM

## 2019-03-16 MED ORDER — SPIRONOLACTONE 25 MG PO TABS: 25.0000 mg | ORAL_TABLET | Freq: Every day | ORAL | 6 refills | Status: DC

## 2019-03-16 NOTE — Progress Notes (Addendum)
Heart Failure TeleHealth Note  Due to national recommendations of social distancing due to [place] 19, Audio/video telehealth visit is felt to be most appropriate for this patient at this time.  See MyChart message from today for patient consent regarding telehealth for [facility]  Date:  03/16/2019   ID:  Allison White, DOB 05-23-1961, MRN [id number]  Location: Home  Provider location: [place] [facility] Type of Visit: Established patient  PCP:  Lillard Anes, MD  Cardiologist:  No primary care provider on file. Primary HF: Raef Sprigg  Chief Complaint: Heart Failure follow-up   History of Present Illness:  Allison White is a 58 y.o. female a h/o morbid obesity, HTN, carotid artery dissection with stenting of left carotid due to possible FMD, CAD s/p anterior MI 1/18 (DES to LAD), systolic HF with EF 63-01% s/p Biotronik ICD who is referred by Dr. Bettina Gavia for assistance with management of her HF.  She presents via Engineer, civil (consulting) for a telehealth visit today.  Has recently been doing well. Taking torsemide 50 bid but she contacted me over MyChart this past week to note that she has had swelling in her hands and feet every time she goes outside.  Says when she is in the sunlight herhands get shiny and very tight. Also states that sometimes her left pinky finger and palm will go numb for about an hour after coming inside. The swelling usually goes away after coming inside. She denies bad allergies. Says this hasn't happened before. When she is in the house says she will swell up sometimes but when she gets hot she swells more. Chronic DOE. Weight stable 221-225. No orthopnea or PND. Has not taken any extra torsemide. Unable to tolerate Entresto 49/51 bid due to SBP 80/40. No wback on 24/26 bid. BP 90-100/50s. No dizziness.    Monitor 10/19 1. Sinus rhythm 2. Rare PVCs and bigeminy (< 1.0%) 3. Two patient-triggered events associated with isolated  PVCs.  4. No high-grade arrhythmias    Echo 06/2017 EF 20-25% Echo 02/2018 LVEF 25-30%, Trivial MR, Normal RV, Mild TR, PA peak pressure 26 mm Hg  CPX 03/17/18 FVC 3.00 (87%), FEV1 2.97(88%), FEV1/FVC 79 (99%)  Peak VO2: 13.8 - When adjusted to the patient's ideal body weight of 142.2 lb (64.5 kg) the peak VO2 is 22.6 ml/kg VE/VCO2 slope: 38 OUES: 1.52 Peak RER: 1.11 Ventilatory Threshold: 11.8 VE/MVV: 95% O2pulse: 8 Interpretation: Mild limitation due to HF and obesity   Sherin Murdoch denies symptoms worrisome for COVID 19.   Past Medical History:  Diagnosis Date   Ejection fraction < 50%    23% as of August 2018   Headache(784.0)    HTN (hypertension)    Hyperlipidemia    MI (myocardial infarction) ([place])    Nonruptured cerebral aneurysm, internal carotid artery    Left side, stent placement (2009)   Vertigo    Past Surgical History:  Procedure Laterality Date   ABDOMINAL HYSTERECTOMY     CARDIAC DEFIBRILLATOR PLACEMENT     CEREBRAL ANEURYSM REPAIR Left 2009   CORONARY ANGIOPLASTY WITH STENT PLACEMENT     PACEMAKER IMPLANT       Current Outpatient Medications  Medication Sig Dispense Refill   aspirin 81 MG chewable tablet Chew 81 mg by mouth daily.     atorvastatin (LIPITOR) 20 MG tablet Take 1 tablet by mouth once daily 90 tablet 0   digoxin (LANOXIN) 0.125 MG tablet Take 1 tablet (0.125 mg total) by  mouth daily. 30 tablet 2   HYDROcodone-acetaminophen (NORCO) 7.5-325 MG per tablet Take 1 tablet by mouth every 6 (six) hours as needed for moderate pain.     metoprolol succinate (TOPROL-XL) 50 MG 24 hr tablet Take 1 tablet (50 mg total) by mouth 2 (two) times daily. 180 tablet 3   nitroGLYCERIN (NITROSTAT) 0.4 MG SL tablet Place 0.4 mg under the tongue every 5 (five) minutes as needed.      Potassium Chloride ER 20 MEQ TBCR Take 1 tablet by mouth once daily 90 tablet 1   sacubitril-valsartan (ENTRESTO) 24-26 MG Take 1 tablet by mouth 2 (two)  times daily. 60 tablet 3   spironolactone (ALDACTONE) 25 MG tablet Take 1 tablet (25 mg total) by mouth daily. 30 tablet 6   torsemide (DEMADEX) 100 MG tablet Take 0.5 tablets (50 mg total) by mouth 2 (two) times daily. 30 tablet 6   No current facility-administered medications for this encounter.     Allergies:   Clopidogrel; Tape; and Plavix [clopidogrel bisulfate]   Social History:  The patient  reports that she has quit smoking. Her smoking use included cigarettes. She has a 6.00 pack-year smoking history. She has never used smokeless tobacco. She reports current alcohol use. She reports that she does not use drugs.   Family History:  The patient's family history includes Cerebral aneurysm in her paternal grandmother; Hypertension in her brother; Prostate cancer in her father; Thyroid disease in her mother.   ROS:  Please see the history of present illness.   All other systems are personally reviewed and negative.   Exam:  (Video/Tele Health Call; Exam is subjective and or/visual.) General:  Speaks in full sentences. No resp difficulty. Lungs: Normal respiratory effort with conversation.  Abdomen: Non-distended per patient report Extremities: Pt denies edema. Neuro: Alert & oriented x 3.   Recent Labs: 08/05/2018: Magnesium 2.4 09/21/2018: ALT 40; NT-Pro BNP 448 11/03/2018: B Natriuretic Peptide 118.8; BUN 15; Creatinine, Ser 1.07; Potassium 3.9; Sodium 139  Personally reviewed   Wt Readings from Last 3 Encounters:  11/03/18 101.9 kg (224 lb 9.6 oz)  11/02/18 101.2 kg (223 lb)  09/21/18 103 kg (227 lb)      ASSESSMENT AND PLAN:  1. Chronic systolic HF, ICM - Echo 07/4173 LVEF 25-30%, Trivial MR, Normal RV, Mild TR, PA peak pressure 26 mm Hg - s/p Biotronik ICD - Overall stable NYHA II-III symptoms  - Volume status hard to assess. Seems to be at baseline but having more swelling when in the sun or she gets hot. Suspect may be related to vasodilation or sun-sensitivity with  meds - Continue Entresto 24/26 bid (fialed uptitration due to low BP)  - Continue torsemide 50 mg BID. Will have her increase carefully to 100/50 for 1-2 days and see if this helps with s3welling. If not consider allergy testing.  - Continue spiro 25 mg daily.  - Continue toprol 50 mg BID - Continue digoxin 0.125 mg. Level 0.5 in 11/19 - CPX 4/19 with mild limitation from HF and obesity.  - Reinforced fluid restriction to < 2 L daily, sodium restriction to less than 2000 mg daily, and the importance of daily weights.   - RTC in 3 months with echo  2. CAD s/p anterior MI 1/18 with DES to LAD in [place] - No s/s of ischemia - continue ASA, b-blocker and statin   3. Obesity - Body mass index is 37.38 kg/m.   4. H/o carotid pseudoaneursym - left ICA  pseudoaneurysm at the cervical petrous portion s/p stent placement (05/2008, 10/2009), chronic HAs and vertigo - has residual vertigo and dizziness  6. Palpitations - Zio patch looked good - Referred for Sleep study but she cancelled due to cost. Husband said she doesn't snore.    COVID screen The patient does not have any symptoms that suggest any further testing/ screening at this time.  Social distancing reinforced today.  Recommended follow-up:  As above  Relevant cardiac medications were reviewed at length with the patient today.   The patient does not have concerns regarding their medications at this time.   The following changes were made today:  As above  Today, I have spent 22 mins with the patient with telehealth technology discussing the above issues .    Signed, Glori Bickers, MD  03/16/2019 10:17 AM  Advanced [facility] Failure [address] [address] [phone] (office) [phone] (fax)

## 2019-03-16 NOTE — Patient Instructions (Addendum)
Refill for your Spironolactone has been sent to [facility] in [place] for you  Please Increase your Torsemide to 100 mg (1 tab) in AM and 50 mg (1/2 tab) in PM for 2 days ONLY, then back to your normal dose of 50 mg Twice daily   Follow telehealth visit with Dr Haroldine Laws in 2 weeks, Tue 5/12 at 2:20 PM  Please send [place] a mychart message or call our office at [phone] if you have any issues or concerns before your next appointment.

## 2019-03-16 NOTE — Addendum Note (Signed)
Encounter addended by: Scarlette Calico, RN on: 03/16/2019 2:48 PM  Actions taken: Order list changed, Clinical Note Signed

## 2019-03-16 NOTE — Progress Notes (Signed)
Spoke w/pt via phone regarding instructions, she verbalizes understanding.  AVS sent to pt via mychart

## 2019-03-18 ENCOUNTER — Other Ambulatory Visit: Payer: Self-pay

## 2019-03-18 ENCOUNTER — Ambulatory Visit (INDEPENDENT_AMBULATORY_CARE_PROVIDER_SITE_OTHER): Payer: Self-pay | Admitting: *Deleted

## 2019-03-18 DIAGNOSIS — I255 Ischemic cardiomyopathy: Secondary | ICD-10-CM

## 2019-03-18 LAB — CUP PACEART REMOTE DEVICE CHECK
Battery Voltage: 3.11 V
Brady Statistic RA Percent Paced: 4 %
Brady Statistic RV Percent Paced: 1 %
Date Time Interrogation Session: 20200430131023
Implantable Lead Implant Date: 20180410
Implantable Lead Implant Date: 20180410
Implantable Lead Location: 753859
Implantable Lead Location: 753860
Implantable Lead Model: 377
Implantable Lead Model: 402266
Implantable Lead Serial Number: 49794726
Implantable Lead Serial Number: 49838890
Implantable Pulse Generator Implant Date: 20180410
Lead Channel Impedance Value: 522 Ohm
Lead Channel Impedance Value: 638 Ohm
Lead Channel Pacing Threshold Amplitude: 0.5 V
Lead Channel Pacing Threshold Amplitude: 0.8 V
Lead Channel Pacing Threshold Pulse Width: 0.4 ms
Lead Channel Pacing Threshold Pulse Width: 0.4 ms
Lead Channel Sensing Intrinsic Amplitude: 1.9 mV
Lead Channel Sensing Intrinsic Amplitude: 18 mV
Lead Channel Setting Pacing Amplitude: 2 V
Lead Channel Setting Pacing Amplitude: 2.5 V
Lead Channel Setting Pacing Pulse Width: 0.4 ms
Lead Channel Setting Sensing Sensitivity: 0.8 mV
Pulse Gen Model: 404622
Pulse Gen Serial Number: 60982098

## 2019-03-26 ENCOUNTER — Encounter: Payer: Self-pay | Admitting: Cardiology

## 2019-03-26 NOTE — Progress Notes (Signed)
Remote ICD transmission.   

## 2019-03-30 ENCOUNTER — Other Ambulatory Visit: Payer: Self-pay

## 2019-03-30 ENCOUNTER — Ambulatory Visit (HOSPITAL_COMMUNITY)
Admission: RE | Admit: 2019-03-30 | Discharge: 2019-03-30 | Disposition: A | Discharge status: Home / Self Care | Payer: Self-pay | Source: Ambulatory Visit | Attending: Internal Medicine | Admitting: Internal Medicine

## 2019-03-30 DIAGNOSIS — I5022 Chronic systolic (congestive) heart failure: Secondary | ICD-10-CM

## 2019-03-30 DIAGNOSIS — I2510 Atherosclerotic heart disease of native coronary artery without angina pectoris: Secondary | ICD-10-CM

## 2019-03-30 NOTE — Progress Notes (Signed)
Heart Failure TeleHealth Note  Due to national recommendations of social distancing due to [place] 19, Audio/video telehealth visit is felt to be most appropriate for this patient at this time.  See MyChart message from today for patient consent regarding telehealth for [facility].  Date:  03/30/2019   ID:  Allison White, DOB 03/03/1961, MRN [id number]  Location: Home  Provider location: [facility] Type of Visit: Established patient  PCP:  Lillard Anes, MD  Cardiologist:  No primary care provider on file. Primary HF: Allison White  Chief Complaint: Heart Failure follow-up   History of Present Illness:  Allison White is a 58 y.o. female a h/o morbid obesity, HTN, carotid artery dissection with stenting of left carotid due to possible FMD, CAD s/p anterior MI 1/18 (DES to LAD), systolic HF with EF 54-27% s/p Biotronik ICD who is referred by Dr. Bettina Gavia for assistance with management of her HF.  She presents via [employer], civil (consulting) for a telehealth visit today.  I had a televisit with her 2 weeks ago at which time she seemed to be doing fairly well but reported having more swelling when in the sun or she gets hot. Suspect may be related to vasodilation or sun-sensitivity with meds. We increased torsemide from 50 bid to 100/50 for 1-2 days to see if that would help. Says it didn't really help much. Says now when she gets hot outside she just comes in early and feels better. Hand swelling improved. Weight stable 221-225. Chronic DOE. No orthopnea or PND. Unable to tolerate Entresto 49/51 bid due to SBP 80/40. Now back on 24/26 bid. BP 90-100/50s. No dizziness.    Monitor 10/19 1. Sinus rhythm 2. Rare PVCs and bigeminy (< 1.0%) 3. Two patient-triggered events associated with isolated PVCs.  4. No high-grade arrhythmias    Echo 06/2017 EF 20-25% Echo 02/2018 LVEF 25-30%, Trivial MR, Normal RV, Mild TR, PA peak pressure 26 mm Hg  CPX 03/17/18  FVC 3.00 (87%), FEV1 2.97(88%), FEV1/FVC 79 (99%)  Peak VO2: 13.8 - When adjusted to the patient's ideal body weight of 142.2 lb (64.5 kg) the peak VO2 is 22.6 ml/kg VE/VCO2 slope: 38 OUES: 1.52 Peak RER: 1.11 Ventilatory Threshold: 11.8 VE/MVV: 95% O2pulse: 8 Interpretation: Mild limitation due to HF and obesity   Allison White denies symptoms worrisome for COVID 19.   Past Medical History:  Diagnosis Date  . Ejection fraction < 50%    23% as of August 2018  . Headache(784.0)   . HTN (hypertension)   . Hyperlipidemia   . MI (myocardial infarction) ([place])   . Nonruptured cerebral aneurysm, internal carotid artery    Left side, stent placement (2009)  . Vertigo    Past Surgical History:  Procedure Laterality Date  . ABDOMINAL HYSTERECTOMY    . CARDIAC DEFIBRILLATOR PLACEMENT    . CEREBRAL ANEURYSM REPAIR Left 2009  . CORONARY ANGIOPLASTY WITH STENT PLACEMENT    . PACEMAKER IMPLANT       Current Outpatient Medications  Medication Sig Dispense Refill  . aspirin 81 MG chewable tablet Chew 81 mg by mouth daily.    [name] atorvastatin (LIPITOR) 20 MG tablet Take 1 tablet by mouth once daily 90 tablet 0  . digoxin (LANOXIN) 0.125 MG tablet Take 1 tablet (0.125 mg total) by mouth daily. 30 tablet 2  . HYDROcodone-acetaminophen (NORCO) 7.5-325 MG per tablet Take 1 tablet by mouth every 6 (six) hours as needed for moderate pain.    [name]  metoprolol succinate (TOPROL-XL) 50 MG 24 hr tablet Take 1 tablet (50 mg total) by mouth 2 (two) times daily. 180 tablet 3  . nitroGLYCERIN (NITROSTAT) 0.4 MG SL tablet Place 0.4 mg under the tongue every 5 (five) minutes as needed.     . Potassium Chloride ER 20 MEQ TBCR Take 1 tablet by mouth once daily 90 tablet 1  . sacubitril-valsartan (ENTRESTO) 24-26 MG Take 1 tablet by mouth 2 (two) times daily. 60 tablet 3  . spironolactone (ALDACTONE) 25 MG tablet Take 1 tablet (25 mg total) by mouth daily. 30 tablet 6  . torsemide (DEMADEX) 100 MG tablet  Take 0.5 tablets (50 mg total) by mouth 2 (two) times daily. 30 tablet 6   No current facility-administered medications for this encounter.     Allergies:   Clopidogrel; Tape; and Plavix [clopidogrel bisulfate]   Social History:  The patient  reports that she has quit smoking. Her smoking use included cigarettes. She has a 6.00 pack-year smoking history. She has never used smokeless tobacco. She reports current alcohol use. She reports that she does not use drugs.   Family History:  The patient's family history includes Cerebral aneurysm in her paternal grandmother; Hypertension in her brother; Prostate cancer in her father; Thyroid disease in her mother.   ROS:  Please see the history of present illness.   All other systems are personally reviewed and negative.   Exam:  (Video/Tele Health Call; Exam is subjective and or/visual.) General:  Speaks in full sentences. No resp difficulty. Lungs: Normal respiratory effort with conversation.  Abdomen: Non-distended per patient report Extremities: Pt denies edema. Neuro: Alert & oriented x 3.   Recent Labs: 08/05/2018: Magnesium 2.4 09/21/2018: ALT 40; NT-Pro BNP 448 11/03/2018: B Natriuretic Peptide 118.8; BUN 15; Creatinine, Ser 1.07; Potassium 3.9; Sodium 139  Personally reviewed   Wt Readings from Last 3 Encounters:  11/03/18 101.9 kg (224 lb 9.6 oz)  11/02/18 101.2 kg (223 lb)  09/21/18 103 kg (227 lb)      ASSESSMENT AND PLAN:  1. Chronic systolic HF, ICM - Echo 11/9756 LVEF 25-30%, Trivial MR, Normal RV, Mild TR, PA peak pressure 26 mm Hg - s/p Biotronik ICD - Overall stable NYHA II-III symptoms  - Volume status appears stable.  - Continue Entresto 24/26 bid (failed uptitration due to low BP)  - Continue torsemide 50 mg BID.  - Continue spiro 25 mg daily.  - Continue toprol 50 mg BID - Continue digoxin 0.125 mg. Level 0.5 in 11/19 - CPX 4/19 with mild limitation from HF and obesity.  - Reinforced fluid restriction to < 2 L  daily, sodium restriction to less than 2000 mg daily, and the importance of daily weights.   - RTC in 3 months with echo. Will arrange to get labwork at Dr. Joya Gaskins office with BMET, BNP, CBC and digoxin level.   2. CAD s/p anterior MI 1/18 with DES to LAD in [place] - No s/s of ischemia - continue ASA, b-blocker and statin   3. Obesity - Body mass index is 37.38 kg/m.   4. 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 Palpitations - Zio patch looked good - Referred for Sleep study but she cancelled due to cost. Husband said she doesn't snore.    COVID screen The patient does not have any symptoms that suggest any further testing/ screening  at this time.  Social distancing reinforced today.  Recommended follow-up:  As above  Relevant cardiac medications were reviewed at length with the patient today.   The patient does not have concerns regarding their medications at this time.   The following changes were made today:  As above  Today, I have spent 17 mins with the patient with telehealth technology discussing the above issues .    Signed, Glori Bickers, MD  03/30/2019 2:14 PM  [facility] [place] [address] Heart and [place] [address] [phone] (office) [phone] (fax)

## 2019-03-30 NOTE — Addendum Note (Signed)
Encounter addended by: Valeda Malm, RN on: 03/30/2019 2:43 PM  Actions taken: Order list changed, Diagnosis association updated, Clinical Note Signed

## 2019-03-30 NOTE — Progress Notes (Addendum)
Spoke with patient, discussed AVS.  She will wait for paper script for labs to be done at Dr. Bettina Gavia office. Verbalized understanding. AVS mailed

## 2019-03-30 NOTE — Addendum Note (Signed)
Encounter addended by: Valeda Malm, RN on: 03/30/2019 3:24 PM  Actions taken: Clinical Note Signed

## 2019-03-30 NOTE — Patient Instructions (Addendum)
A prescription for lab work has been mailed for  BMET, BNP, CBC and digoxin level.  Please go to Dr. Joya Gaskins office as soon as you receive this script  Your physician recommends that you schedule a follow-up appointment in: 3 months with an echo  Your physician has requested that you have an echocardiogram. Echocardiography is a painless test that uses sound waves to create images of your heart. It provides your doctor with information about the size and shape of your heart and how well your heart's chambers and valves are working. This procedure takes approximately one hour. There are no restrictions for this procedure.

## 2019-04-01 ENCOUNTER — Other Ambulatory Visit (HOSPITAL_COMMUNITY): Payer: BLUE CROSS/BLUE SHIELD

## 2019-04-01 ENCOUNTER — Encounter (HOSPITAL_COMMUNITY): Payer: BLUE CROSS/BLUE SHIELD | Admitting: Internal Medicine

## 2019-04-05 DIAGNOSIS — I5022 Chronic systolic (congestive) heart failure: Secondary | ICD-10-CM | POA: Diagnosis not present

## 2019-04-16 ENCOUNTER — Telehealth (HOSPITAL_COMMUNITY): Payer: Self-pay

## 2019-04-16 NOTE — Telephone Encounter (Signed)
Pt called requesting lab results from Dr Henrene Pastor office.  Labs were not located in office, called Dr. Pearletha Furl office at [phone], office is closed. Will make another attempt on Monday to f/u.

## 2019-04-19 NOTE — Telephone Encounter (Signed)
Request for most recent labs complete Labs done 04/05/19 and will be sent to [phone]

## 2019-04-21 ENCOUNTER — Encounter (HOSPITAL_COMMUNITY): Payer: Self-pay

## 2019-04-22 ENCOUNTER — Other Ambulatory Visit (HOSPITAL_COMMUNITY): Payer: Self-pay | Admitting: Internal Medicine

## 2019-04-23 ENCOUNTER — Other Ambulatory Visit: Payer: Self-pay | Admitting: Cardiology

## 2019-05-12 ENCOUNTER — Encounter (HOSPITAL_COMMUNITY): Payer: Self-pay

## 2019-06-05 ENCOUNTER — Other Ambulatory Visit: Payer: Self-pay | Admitting: Cardiology

## 2019-06-05 ENCOUNTER — Other Ambulatory Visit (HOSPITAL_COMMUNITY): Payer: Self-pay | Admitting: Adult Health

## 2019-06-08 DIAGNOSIS — I2510 Atherosclerotic heart disease of native coronary artery without angina pectoris: Secondary | ICD-10-CM | POA: Diagnosis not present

## 2019-06-08 DIAGNOSIS — I773 Arterial fibromuscular dysplasia: Secondary | ICD-10-CM | POA: Diagnosis not present

## 2019-06-08 DIAGNOSIS — E782 Mixed hyperlipidemia: Secondary | ICD-10-CM | POA: Diagnosis not present

## 2019-06-08 DIAGNOSIS — I10 Essential (primary) hypertension: Secondary | ICD-10-CM | POA: Diagnosis not present

## 2019-06-17 ENCOUNTER — Ambulatory Visit (INDEPENDENT_AMBULATORY_CARE_PROVIDER_SITE_OTHER): Payer: Self-pay | Admitting: *Deleted

## 2019-06-17 DIAGNOSIS — I255 Ischemic cardiomyopathy: Secondary | ICD-10-CM

## 2019-06-18 LAB — CUP PACEART REMOTE DEVICE CHECK
Date Time Interrogation Session: 20200731090032
Implantable Lead Implant Date: 20180410
Implantable Lead Implant Date: 20180410
Implantable Lead Location: 753859
Implantable Lead Location: 753860
Implantable Lead Model: 377
Implantable Lead Model: 402266
Implantable Lead Serial Number: 49794726
Implantable Lead Serial Number: 49838890
Implantable Pulse Generator Implant Date: 20180410
Pulse Gen Model: 404622
Pulse Gen Serial Number: 60982098

## 2019-06-25 ENCOUNTER — Encounter: Payer: Self-pay | Admitting: Cardiology

## 2019-06-25 NOTE — Progress Notes (Signed)
Remote ICD transmission.   

## 2019-07-13 ENCOUNTER — Other Ambulatory Visit: Payer: Self-pay

## 2019-07-13 ENCOUNTER — Encounter (HOSPITAL_COMMUNITY): Payer: Self-pay | Admitting: Internal Medicine

## 2019-07-13 ENCOUNTER — Ambulatory Visit (HOSPITAL_BASED_OUTPATIENT_CLINIC_OR_DEPARTMENT_OTHER)
Admission: RE | Admit: 2019-07-13 | Discharge: 2019-07-13 | Disposition: A | Discharge status: Home / Self Care | Payer: PPO | Source: Ambulatory Visit | Attending: Internal Medicine | Admitting: Internal Medicine

## 2019-07-13 ENCOUNTER — Ambulatory Visit (HOSPITAL_COMMUNITY)
Admission: RE | Admit: 2019-07-13 | Discharge: 2019-07-13 | Disposition: A | Discharge status: Home / Self Care | Payer: PPO | Source: Ambulatory Visit | Attending: Internal Medicine | Admitting: Internal Medicine

## 2019-07-13 VITALS — BP 110/80 | HR 96 | Wt 222.4 lb

## 2019-07-13 DIAGNOSIS — Z955 Presence of coronary angioplasty implant and graft: Secondary | ICD-10-CM | POA: Diagnosis not present

## 2019-07-13 DIAGNOSIS — Z888 Allergy status to other drugs, medicaments and biological substances status: Secondary | ICD-10-CM | POA: Diagnosis not present

## 2019-07-13 DIAGNOSIS — I255 Ischemic cardiomyopathy: Secondary | ICD-10-CM | POA: Diagnosis not present

## 2019-07-13 DIAGNOSIS — Z87891 Personal history of nicotine dependence: Secondary | ICD-10-CM | POA: Insufficient documentation

## 2019-07-13 DIAGNOSIS — I5022 Chronic systolic (congestive) heart failure: Secondary | ICD-10-CM | POA: Insufficient documentation

## 2019-07-13 DIAGNOSIS — Z6837 Body mass index (BMI) 37.0-37.9, adult: Secondary | ICD-10-CM | POA: Diagnosis not present

## 2019-07-13 DIAGNOSIS — I2510 Atherosclerotic heart disease of native coronary artery without angina pectoris: Secondary | ICD-10-CM | POA: Insufficient documentation

## 2019-07-13 DIAGNOSIS — E669 Obesity, unspecified: Secondary | ICD-10-CM | POA: Insufficient documentation

## 2019-07-13 DIAGNOSIS — Z7982 Long term (current) use of aspirin: Secondary | ICD-10-CM | POA: Diagnosis not present

## 2019-07-13 DIAGNOSIS — I252 Old myocardial infarction: Secondary | ICD-10-CM | POA: Insufficient documentation

## 2019-07-13 DIAGNOSIS — Z95810 Presence of automatic (implantable) cardiac defibrillator: Secondary | ICD-10-CM | POA: Insufficient documentation

## 2019-07-13 DIAGNOSIS — I110 Hypertensive heart disease with heart failure: Secondary | ICD-10-CM | POA: Diagnosis not present

## 2019-07-13 DIAGNOSIS — R42 Dizziness and giddiness: Secondary | ICD-10-CM | POA: Diagnosis not present

## 2019-07-13 DIAGNOSIS — Z8349 Family history of other endocrine, nutritional and metabolic diseases: Secondary | ICD-10-CM | POA: Insufficient documentation

## 2019-07-13 DIAGNOSIS — I429 Cardiomyopathy, unspecified: Secondary | ICD-10-CM | POA: Diagnosis not present

## 2019-07-13 DIAGNOSIS — Z8249 Family history of ischemic heart disease and other diseases of the circulatory system: Secondary | ICD-10-CM | POA: Insufficient documentation

## 2019-07-13 DIAGNOSIS — Z79899 Other long term (current) drug therapy: Secondary | ICD-10-CM | POA: Diagnosis not present

## 2019-07-13 DIAGNOSIS — E785 Hyperlipidemia, unspecified: Secondary | ICD-10-CM | POA: Diagnosis not present

## 2019-07-13 DIAGNOSIS — R002 Palpitations: Secondary | ICD-10-CM | POA: Diagnosis not present

## 2019-07-13 NOTE — Progress Notes (Addendum)
Advanced [facility] Failure Clinic Note  Date:  07/13/2019   ID:  Allison White, DOB [birth year], MRN [id number]  Location: Home  Provider location: [facility] Type of Visit: Established patient  PCP:  Lillard Anes, MD  Cardiologist:  No primary care provider on file. Primary HF: [place]  Chief Complaint: Heart Failure follow-up   History of Present Illness:  Allison White is a 58 y.o. female a h/o morbid obesity, HTN, carotid artery dissection with stenting of left carotid due to possible FMD, CAD s/p anterior MI 1/18 (DES to LAD), systolic HF with EF 0000000 s/p Biotronik ICD who is referred by Dr. Bettina Gavia for assistance with management of her HF.  She presents today for routine f/u. I had a televisit with her a few months ago at which time she seemed to be doing fairly well but reported having more swelling when in the sun or she gets hot. Suspect may be related to vasodilation or sun-sensitivity with meds. We increased torsemide from 50 bid to 100/50 for 1-2 days to see if that would help. Says it didn't really help much. Says she is tries to stay active and can "force herself" to do quite a bit. Will do some swimming in her pool. No problems with ADLs. Will do 2-3 miles on stationary bike. Struggles with hills and steps. Occasional CP. Takes aspirin and sometimes gets better No real change. Not c/w with exertion. Denies edema, orthopnea or PND.   Says now when she gets hot outside she just comes in early and feels better. Hand swelling improved. Weight stable 221-225. Chronic DOE. No orthopnea or PND. Unable to tolerate Entresto 49/51 bid due to SBP 80/40. Now back on 24/26 bid. BP 90-100/50s. No dizziness. Takes BP at home. SBP typically in 90s. No orthostasis.    Monitor 10/19 1. Sinus rhythm 2. Rare PVCs and bigeminy (< 1.0%) 3. Two patient-triggered events associated with isolated PVCs.  4. No high-grade arrhythmias    Echo 06/2017 EF  20-25% Echo 02/2018 LVEF 25-30%, Trivial MR, Normal RV, Mild TR, PA peak pressure 26 mm Hg  CPX 03/17/18 FVC 3.00 (87%), FEV1 2.97(88%), FEV1/FVC 79 (99%)  Peak VO2: 13.8 - When adjusted to the patient's ideal body weight of 142.2 lb (64.5 kg) the peak VO2 is 22.6 ml/kg VE/VCO2 slope: 38 OUES: 1.52 Peak RER: 1.11 Ventilatory Threshold: 11.8 VE/MVV: 95% O2pulse: 8 Interpretation: Mild limitation due to HF and obesity   Allison White denies symptoms worrisome for COVID 19.   Past Medical History:  Diagnosis Date  . Ejection fraction < 50%    23% as of August 2018  . Headache(784.0)   . HTN (hypertension)   . Hyperlipidemia   . MI (myocardial infarction) ([place])   . Nonruptured cerebral aneurysm, internal carotid artery    Left side, stent placement (2009)  . Vertigo    Past Surgical History:  Procedure Laterality Date  . ABDOMINAL HYSTERECTOMY    . CARDIAC DEFIBRILLATOR PLACEMENT    . CEREBRAL ANEURYSM REPAIR Left 2009  . CORONARY ANGIOPLASTY WITH STENT PLACEMENT    . PACEMAKER IMPLANT       Current Outpatient Medications  Medication Sig Dispense Refill  . aspirin 81 MG chewable tablet Chew 81 mg by mouth daily.    [name] atorvastatin (LIPITOR) 20 MG tablet Take 1 tablet by mouth once daily 30 tablet 0  . digoxin (LANOXIN) 0.125 MG tablet Take 1 tablet by mouth once daily 90 tablet  0  . ENTRESTO 24-26 MG Take 1 tablet by mouth twice daily 60 tablet 2  . HYDROcodone-acetaminophen (NORCO) 7.5-325 MG per tablet Take 1 tablet by mouth every 6 (six) hours as needed for moderate pain.    . metoprolol succinate (TOPROL-XL) 50 MG 24 hr tablet Take 1 tablet (50 mg total) by mouth 2 (two) times daily. 180 tablet 3  . nitroGLYCERIN (NITROSTAT) 0.4 MG SL tablet Place 0.4 mg under the tongue every 5 (five) minutes as needed.     . Potassium Chloride ER 20 MEQ TBCR Take 1 tablet by mouth once daily 90 tablet 1  . spironolactone (ALDACTONE) 25 MG tablet Take 1 tablet (25 mg total) by  mouth daily. 30 tablet 6  . torsemide (DEMADEX) 100 MG tablet Take 0.5 tablets (50 mg total) by mouth 2 (two) times daily. 30 tablet 6   No current facility-administered medications for this encounter.     Allergies:   Clopidogrel, Tape, and Plavix [clopidogrel bisulfate]   Social History:  The patient  reports that she has quit smoking. Her smoking use included cigarettes. She has a 6.00 pack-year smoking history. She has never used smokeless tobacco. She reports current alcohol use. She reports that she does not use drugs.   Family History:  The patient's family history includes Cerebral aneurysm in her paternal grandmother; Hypertension in her brother; Prostate cancer in her father; Thyroid disease in her mother.   ROS:  Please see the history of present illness.   All other systems are personally reviewed and negative.   Vitals:   07/13/19 1404  BP: 110/80  Pulse: 96  SpO2: 96%   Body mass index is 37.01 kg/m.  Exam:   General:  Well appearing. No resp difficulty HEENT: normal Neck: supple. no JVD. Carotids 2+ bilat; no bruits. No lymphadenopathy or thryomegaly appreciated. Cor: PMI nondisplaced. Regular rate & rhythm. No rubs, gallops or murmurs. Lungs: clear Abdomen: obese soft, nontender, nondistended. No hepatosplenomegaly. No bruits or masses. Good bowel sounds. Extremities: no cyanosis, clubbing, rash, edema Neuro: alert & orientedx3, cranial nerves grossly intact. moves all 4 extremities w/o difficulty. Affect pleasant  Recent Labs: 08/05/2018: Magnesium 2.4 09/21/2018: ALT 40; NT-Pro BNP 448 11/03/2018: B Natriuretic Peptide 118.8; BUN 15; Creatinine, Ser 1.07; Potassium 3.9; Sodium 139    Wt Readings from Last 3 Encounters:  11/03/18 101.9 kg (224 lb 9.6 oz)  11/02/18 101.2 kg (223 lb)  09/21/18 103 kg (227 lb)      ASSESSMENT AND PLAN:  1. Chronic systolic HF, ICM - Echo 123XX123 LVEF 25-30%, Trivial MR, Normal RV, Mild TR, PA peak pressure 26 mm Hg - s/p  Biotronik ICD - Echo today EF 20-25% - Overall stable NYHA II-III symptoms  - Volume status looks good - Continue Entresto 24/26 bid (failed uptitration due to low BP with SBPs in 70s)  - Continue torsemide 50 mg BID.  - Continue spiro 25 mg daily.  - Continue toprol 50 mg BID - Continue digoxin 0.125 mg. Level 0.4 in 5/20 - CPX 4/19 with mild limitation from HF and obesity.  - Reinforced fluid restriction to < 2 L daily, sodium restriction to less than 2000 mg daily, and the importance of daily weights.   - Long talk about fact that we are at the limits of medical therapy given her soft BP. EF unlikely to recover. I introduced topic of possible advanced therapies down the road (I.e. transplant or LVAD). Currently symptoms not that advanced but will have  to watch closely.  - See back in 4 months with repeat CPX.  - ICD interrogated in clinic. No VT/VF   2. CAD s/p anterior MI 1/18 with DES to LAD in [place] - No s/s of ischemia - continue ASA, b-blocker and statin   3. Obesity - Body mass index is 37.01 kg/m.  - Encouraged more exercise  4. 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 Palpitations - Zio patch looked good - Referred for Sleep study but she cancelled due to cost. Husband said she doesn't snore.   Signed, Glori Bickers, MD  07/13/2019 1:58 PM  [facility] [place] [address] Heart and [place] [address] [phone] (office) [phone] (fax)

## 2019-07-13 NOTE — Patient Instructions (Signed)
Follow up and cpx in 4 months.

## 2019-07-13 NOTE — Progress Notes (Signed)
  Echocardiogram 2D Echocardiogram has been performed.  Allison White 07/13/2019, 1:43 PM

## 2019-07-31 ENCOUNTER — Other Ambulatory Visit: Payer: Self-pay | Admitting: Cardiology

## 2019-07-31 ENCOUNTER — Other Ambulatory Visit (HOSPITAL_COMMUNITY): Payer: Self-pay | Admitting: Internal Medicine

## 2019-08-14 ENCOUNTER — Other Ambulatory Visit: Payer: Self-pay | Admitting: Cardiology

## 2019-08-27 ENCOUNTER — Other Ambulatory Visit: Payer: Self-pay | Admitting: Cardiology

## 2019-08-27 NOTE — Telephone Encounter (Signed)
Rx refill sent to pharmacy. 

## 2019-09-05 ENCOUNTER — Other Ambulatory Visit: Payer: Self-pay | Admitting: Cardiology

## 2019-09-06 NOTE — Telephone Encounter (Signed)
Torsemide refill sent to [facility] in [place]

## 2019-09-08 DIAGNOSIS — Z23 Encounter for immunization: Secondary | ICD-10-CM | POA: Diagnosis not present

## 2019-09-08 DIAGNOSIS — I5022 Chronic systolic (congestive) heart failure: Secondary | ICD-10-CM | POA: Diagnosis not present

## 2019-09-08 DIAGNOSIS — E782 Mixed hyperlipidemia: Secondary | ICD-10-CM | POA: Diagnosis not present

## 2019-09-08 DIAGNOSIS — Z1159 Encounter for screening for other viral diseases: Secondary | ICD-10-CM | POA: Diagnosis not present

## 2019-09-08 DIAGNOSIS — G43109 Migraine with aura, not intractable, without status migrainosus: Secondary | ICD-10-CM | POA: Diagnosis not present

## 2019-09-08 DIAGNOSIS — Z6837 Body mass index (BMI) 37.0-37.9, adult: Secondary | ICD-10-CM | POA: Diagnosis not present

## 2019-09-08 DIAGNOSIS — I10 Essential (primary) hypertension: Secondary | ICD-10-CM | POA: Diagnosis not present

## 2019-09-08 DIAGNOSIS — I773 Arterial fibromuscular dysplasia: Secondary | ICD-10-CM | POA: Diagnosis not present

## 2019-09-13 ENCOUNTER — Other Ambulatory Visit: Payer: Self-pay | Admitting: Cardiology

## 2019-09-13 DIAGNOSIS — R7309 Other abnormal glucose: Secondary | ICD-10-CM | POA: Diagnosis not present

## 2019-09-13 NOTE — Telephone Encounter (Signed)
°*  STAT* If patient is at the pharmacy, call can be transferred to refill team.   1. Which medications need to be refilled? (please list name of each medication and dose if known) Digoxin .125mg  tab  2. Which pharmacy/location (including street and city if local pharmacy) is medication to be sent to?[facility] on [address] in [place]  3. Do they need a 30 day or 90 day supply? [place]

## 2019-09-14 MED ORDER — DIGOXIN 125 MCG PO TABS: 125.0000 ug | ORAL_TABLET | Freq: Every day | ORAL | 1 refills | Status: DC

## 2019-09-14 NOTE — Telephone Encounter (Signed)
Refills sent in

## 2019-09-16 ENCOUNTER — Ambulatory Visit (INDEPENDENT_AMBULATORY_CARE_PROVIDER_SITE_OTHER): Payer: PPO | Admitting: *Deleted

## 2019-09-16 DIAGNOSIS — I429 Cardiomyopathy, unspecified: Secondary | ICD-10-CM | POA: Diagnosis not present

## 2019-09-16 DIAGNOSIS — I5022 Chronic systolic (congestive) heart failure: Secondary | ICD-10-CM

## 2019-09-16 LAB — CUP PACEART REMOTE DEVICE CHECK
Brady Statistic RA Percent Paced: 4 %
Brady Statistic RV Percent Paced: 1 %
Date Time Interrogation Session: 20201029234041
HighPow Impedance: 93 Ohm
Implantable Lead Implant Date: 20180410
Implantable Lead Implant Date: 20180410
Implantable Lead Location: 753859
Implantable Lead Location: 753860
Implantable Lead Model: 377
Implantable Lead Model: 402266
Implantable Lead Serial Number: 49794726
Implantable Lead Serial Number: 49838890
Implantable Pulse Generator Implant Date: 20180410
Lead Channel Impedance Value: 522 Ohm
Lead Channel Impedance Value: 638 Ohm
Lead Channel Pacing Threshold Amplitude: 0.6 V
Lead Channel Pacing Threshold Amplitude: 0.6 V
Lead Channel Pacing Threshold Pulse Width: 0.4 ms
Lead Channel Pacing Threshold Pulse Width: 0.4 ms
Lead Channel Sensing Intrinsic Amplitude: 18.6 mV
Lead Channel Sensing Intrinsic Amplitude: 2.1 mV
Lead Channel Setting Pacing Amplitude: 2 V
Lead Channel Setting Pacing Amplitude: 2.5 V
Lead Channel Setting Pacing Pulse Width: 0.4 ms
Lead Channel Setting Sensing Sensitivity: 0.8 mV
Pulse Gen Model: 404622
Pulse Gen Serial Number: 60982098

## 2019-10-09 NOTE — Progress Notes (Signed)
Remote ICD transmission.   

## 2019-10-20 ENCOUNTER — Encounter: Payer: Self-pay | Admitting: *Deleted

## 2019-10-20 ENCOUNTER — Other Ambulatory Visit: Payer: Self-pay

## 2019-10-20 ENCOUNTER — Encounter: Payer: Self-pay | Admitting: Cardiology

## 2019-10-20 ENCOUNTER — Ambulatory Visit (INDEPENDENT_AMBULATORY_CARE_PROVIDER_SITE_OTHER): Payer: PPO | Admitting: Cardiology

## 2019-10-20 VITALS — BP 118/76 | HR 79 | Ht 65.0 in | Wt 229.6 lb

## 2019-10-20 DIAGNOSIS — E7800 Pure hypercholesterolemia, unspecified: Secondary | ICD-10-CM | POA: Diagnosis not present

## 2019-10-20 DIAGNOSIS — I2510 Atherosclerotic heart disease of native coronary artery without angina pectoris: Secondary | ICD-10-CM

## 2019-10-20 DIAGNOSIS — I5022 Chronic systolic (congestive) heart failure: Secondary | ICD-10-CM

## 2019-10-20 DIAGNOSIS — I25119 Atherosclerotic heart disease of native coronary artery with unspecified angina pectoris: Secondary | ICD-10-CM | POA: Diagnosis not present

## 2019-10-20 DIAGNOSIS — M7989 Other specified soft tissue disorders: Secondary | ICD-10-CM | POA: Diagnosis not present

## 2019-10-20 MED ORDER — DAPAGLIFLOZIN PROPANEDIOL 10 MG PO TABS: 10.0000 mg | ORAL_TABLET | Freq: Every day | ORAL | 5 refills | Status: DC

## 2019-10-20 NOTE — Patient Instructions (Signed)
Medication Instructions:  Your physician has recommended you make the following change in your medication:   START farxiga 10 mg: Take 1 tablet daily   *If you need a refill on your cardiac medications before your next appointment, please call your pharmacy*  Lab Work: Your physician recommends that you return for lab work today: BMP, [place].   If you have labs (blood work) drawn today and your tests are completely normal, you will receive your results only by: [name] MyChart Message (if you have MyChart) OR . A paper copy in the mail If you have any lab test that is abnormal or we need to change your treatment, we will call you to review the results.  Testing/Procedures: You had an EKG today.   Follow-Up: At [facility], you and your health needs are our priority.  As part of our continuing mission to provide you with exceptional heart care, we have created designated Provider Care Teams.  These Care Teams include your primary Cardiologist (physician) and Advanced Practice Providers (APPs -  Physician Assistants and Nurse Practitioners) who all work together to provide you with the care you need, when you need it.  Your next appointment:   6 month(s)  The format for your next appointment:   In Person  Provider:   Shirlee More, MD    Dapagliflozin tablets What is this medicine? DAPAGLIFLOZIN (DAP a gli FLOE zin) helps to treat type 2 diabetes. It helps to control blood sugar. Treatment is combined with diet and exercise. This drug may also be used to reduce the risk of going to the hospital for heart failure if you have type 2 diabetes and risk factors for heart disease. This medicine may be used for other purposes; ask your health care provider or pharmacist if you have questions. COMMON BRAND NAME(S): Wilder Glade What should I tell my health care provider before I take this medicine? They need to know if you have any of these conditions:  dehydration  diabetic  ketoacidosis  diet low in salt  eating less due to illness, surgery, dieting, or any other reason  having surgery  history of pancreatitis or pancreas problems  history of yeast infection of the penis or vagina  if you often drink alcohol  infections in the bladder, kidneys, or urinary tract  kidney disease  low blood pressure  on hemodialysis  problems urinating  type 1 diabetes  uncircumcised female  an unusual or allergic reaction to dapagliflozin, other medicines, foods, dyes, or preservatives  pregnant or trying to get pregnant  breast-feeding How should I use this medicine? Take this medicine by mouth with a glass of water. Follow the directions on the prescription label. You can take it with or without food. If it upsets your stomach, take it with food. Take this medicine in the morning. Take your dose at the same time each day. Do not take more often than directed. Do not stop taking except on your doctor's advice. A special MedGuide will be given to you by the pharmacist with each prescription and refill. Be sure to read this information carefully each time. Talk to your pediatrician regarding the use of this medicine in children. Special care may be needed. Overdosage: If you think you have taken too much of this medicine contact a poison control center or emergency room at once. NOTE: This medicine is only for you. Do not share this medicine with others. What if I miss a dose? If you miss a dose, take it  as soon as you can. If it is almost time for your next dose, take only that dose. Do not take double or extra doses. What may interact with this medicine? Do not take this medicine with any of the following medications:  gatifloxacin This medicine may also interact with the following medications:  alcohol  certain medicines for blood pressure, heart disease  diuretics  insulin  nateglinide  pioglitazone  quinolone antibiotics like ciprofloxacin,  levofloxacin, ofloxacin  repaglinide  some herbal dietary supplements  steroid medicines like prednisone or cortisone  sulfonylureas like glimepiride, glipizide, glyburide  thyroid medicine This list may not describe all possible interactions. Give your health care provider a list of all the medicines, herbs, non-prescription drugs, or dietary supplements you use. Also tell them if you smoke, drink alcohol, or use illegal drugs. Some items may interact with your medicine. What should I watch for while using this medicine? Visit your doctor or health care professional for regular checks on your progress. This medicine can cause a serious condition in which there is too much acid in the blood. If you develop nausea, vomiting, stomach pain, unusual tiredness, or breathing problems, stop taking this medicine and call your doctor right away. If possible, use a ketone dipstick to check for ketones in your urine. A test called the HbA1C (A1C) will be monitored. This is a simple blood test. It measures your blood sugar control over the last 2 to 3 months. You will receive this test every 3 to 6 months. Learn how to check your blood sugar. Learn the symptoms of low and high blood sugar and how to manage them. Always carry a quick-source of sugar with you in case you have symptoms of low blood sugar. Examples include hard sugar candy or glucose tablets. Make sure others know that you can choke if you eat or drink when you develop serious symptoms of low blood sugar, such as seizures or unconsciousness. They must get medical help at once. Tell your doctor or health care professional if you have high blood sugar. You might need to change the dose of your medicine. If you are sick or exercising more than usual, you might need to change the dose of your medicine. Do not skip meals. Ask your doctor or health care professional if you should avoid alcohol. Many nonprescription cough and cold products contain sugar  or alcohol. These can affect blood sugar. Wear a medical ID bracelet or chain, and carry a card that describes your disease and details of your medicine and dosage times. What side effects may I notice from receiving this medicine? Side effects that you should report to your doctor or health care professional as soon as possible:  allergic reactions like skin rash, itching or hives, swelling of the face, lips, or tongue  breathing problems  dizziness  feeling faint or lightheaded, falls  muscle weakness  nausea, vomiting, unusual stomach upset or pain  new pain or tenderness, change in skin color, sores or ulcers, or infection in legs or feet  penile discharge, itching, or pain in men  signs and symptoms of a genital infection, such as fever; tenderness, redness, or swelling in the genitals or area from the genitals to the back of the rectum  signs and symptoms of low blood sugar such as feeling anxious, confusion, dizziness, increased hunger, unusually weak or tired, sweating, shakiness, cold, irritable, headache, blurred vision, fast heartbeat, loss of consciousness  signs and symptoms of a urinary tract infection, such as  fever, chills, a burning feeling when urinating, blood in the urine, back pain  trouble passing urine or change in the amount of urine, including an urgent need to urinate more often, in larger amounts, or at night  unusual tiredness  vaginal discharge, itching, or odor in women Side effects that usually do not require medical attention (report to your doctor or health care professional if they continue or are bothersome):  mild increase in urination  thirsty This list may not describe all possible side effects. Call your doctor for medical advice about side effects. You may report side effects to FDA at 1-800-FDA-1088. Where should I keep my medicine? Keep out of the reach of children. Store at room temperature between 15 and 30 degrees C (59 and 86  degrees F). Throw away any unused medicine after the expiration date. NOTE: This sheet is a summary. It may not cover all possible information. If you have questions about this medicine, talk to your doctor, pharmacist, or health care provider.  2020 Elsevier/Gold Standard (2018-09-18 15:24:42)

## 2019-10-20 NOTE — Progress Notes (Signed)
Cardiology Office Note:    Date:  10/20/2019   ID:  Allison White, DOB Apr 04, 1961, MRN [id number]  PCP:  Lillard Anes, MD  Cardiologist:  Shirlee More, MD    Referring MD: Lillard Anes,*    ASSESSMENT:    1. Chronic systolic congestive heart failure ([place])   2. Coronary artery disease involving native coronary artery of native heart with angina pectoris ([place])   3. CAD in native artery   4. Hypercholesteremia    PLAN:    In order of problems listed above:  1. Heart failure is improved and despite soft hemodynamics tolerates her distal diuretic MRA beta-blocker low-dose Entresto and digoxin.  She will continue the same.  We talked about additional therapy SGLT2 inhibitor and we will start today.  For safety and efficacy will check renal function proBNP level. 2. Stable CAD continue medical treatment aspirin beta-blocker statin.  Recent labs requested from her PCP office for lipid profile liver function 3. Continue statin   Next appointment: 6 months   Medication Adjustments/Labs and Tests Ordered: Current medicines are reviewed at length with the patient today.  Concerns regarding medicines are outlined above.  Orders Placed This Encounter  Procedures  . Basic Metabolic Panel (BMET)  . Pro b natriuretic peptide (BNP)  . EKG 12-Lead   Meds ordered this encounter  Medications  . dapagliflozin propanediol (FARXIGA) 10 MG TABS tablet    Sig: Take 10 mg by mouth daily before breakfast.    Dispense:  30 tablet    Refill:  5    No chief complaint on file.   History of Present Illness:    Allison White is a 58 y.o. female with a hx of morbid obesity, HTN, carotid artery dissection with stenting of left carotid due to possible FMD, CAD s/p anterior MI 1/18 (DES to LAD), systolic HF with EF 0000000 s/p Biotronik ICD  She was last seen 07/13/2019. Compliance with diet, lifestyle and medications: Yes  She has noticed a sustained functional improvement with  digoxin she is short of breath more than usual activities New York Heart Association class II but no orthopnea chest pain palpitation or syncope and home blood pressure runs in the range of 95-110/60 tolerating her cardiomyopathy and heart failure medications.  She said for a cardiopulmonary stress test as part of advanced heart failure is aware that she in the future may need advanced modalities like LVAD.  Her ICD is stable followed in our practice.  She weighs daily stable sodium restricts is compliant with medications.  She did not tolerate up titration of Entresto Past Medical History:  Diagnosis Date  . Ejection fraction < 50%    23% as of August 2018  . Headache(784.0)   . HTN (hypertension)   . Hyperlipidemia   . MI (myocardial infarction) ([place])   . Nonruptured cerebral aneurysm, internal carotid artery    Left side, stent placement (2009)  . Vertigo     Past Surgical History:  Procedure Laterality Date  . ABDOMINAL HYSTERECTOMY    . CARDIAC DEFIBRILLATOR PLACEMENT    . CEREBRAL ANEURYSM REPAIR Left 2009  . CORONARY ANGIOPLASTY WITH STENT PLACEMENT    . PACEMAKER IMPLANT      Current Medications: Current Meds  Medication Sig  . aspirin 81 MG chewable tablet Chew 81 mg by mouth daily.  [name] atorvastatin (LIPITOR) 20 MG tablet Take 1 tablet by mouth once daily  . digoxin (LANOXIN) 0.125 MG tablet Take 1 tablet (125 mcg total)  by mouth daily.  [name] ENTRESTO 24-26 MG Take 1 tablet by mouth twice daily  . HYDROcodone-acetaminophen (NORCO) 7.5-325 MG per tablet Take 1 tablet by mouth every 6 (six) hours as needed for moderate pain.  . metoprolol succinate (TOPROL-XL) 50 MG 24 hr tablet Take 1 tablet by mouth twice daily  . nitroGLYCERIN (NITROSTAT) 0.4 MG SL tablet Place 0.4 mg under the tongue every 5 (five) minutes as needed.   . Potassium Chloride ER 20 MEQ TBCR TAKE 1 TABLET BY MOUTH ONCE DAILY, NEEDS OFFICE VISIT FOR FURTHER REFILLS  . torsemide (DEMADEX) 100 MG tablet TAKE 1/2  TABLET BY MOUTH TWICE DAILY. PATIENT NEEDS TO SCHEDULE APPT FOR REFILLS     Allergies:   Clopidogrel, Tape, and Plavix [clopidogrel bisulfate]   Social History   Socioeconomic History  . Marital status: Married    Spouse name: Not on file  . Number of children: Not on file  . Years of education: Not on file  . Highest education level: Not on file  Occupational History  . Not on file  Social Needs  . Financial resource strain: Not on file  . Food insecurity    Worry: Not on file    Inability: Not on file  . Transportation needs    Medical: Not on file    Non-medical: Not on file  Tobacco Use  . Smoking status: Former Smoker    Packs/day: 1.00    Years: 6.00    Pack years: 6.00    Types: Cigarettes  . Smokeless tobacco: Never Used  . Tobacco comment: Quit 30 years ago  Substance and Sexual Activity  . Alcohol use: Yes    Comment: Occasionally  . Drug use: No  . Sexual activity: Not on file  Lifestyle  . Physical activity    Days per week: Not on file    Minutes per session: Not on file  . Stress: Not on file  Relationships  . Social Herbalist on phone: Not on file    Gets together: Not on file    Attends religious service: Not on file    Active member of club or organization: Not on file    Attends meetings of clubs or organizations: Not on file    Relationship status: Not on file  Other Topics Concern  . 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       Family History: The patient's family history includes Cerebral aneurysm in her paternal grandmother; Hypertension in her brother; Prostate cancer in her father; Thyroid disease in her mother. ROS:   Please see the history of present illness.    All other systems reviewed and are negative.  EKGs/Labs/Other Studies Reviewed:    The following studies were reviewed today:  EKG:  EKG ordered today and personally  reviewed.  The ekg ordered today demonstrates sinus rhythm old anterior MI with persistent ST elevation consider aneurysm anterior septal left axis deviation Q normal QT interval  Recent Labs: 11/03/2018: B Natriuretic Peptide 118.8; BUN 15; Creatinine, Ser 1.07; Potassium 3.9; Sodium 139  Recent Lipid Panel    Component Value Date/Time   CHOL 169 09/21/2018 0920   TRIG 226 (H) 09/21/2018 0920   HDL 44 09/21/2018 0920   CHOLHDL 3.8 09/21/2018 0920   CHOLHDL 4.1 06/11/2008 0825   VLDL 23 06/11/2008 0825   LDLCALC 80 09/21/2018 0920  Physical Exam:    VS:  BP 118/76 (BP Location: Right Arm, Patient Position: Sitting, Cuff Size: Large)   Pulse 79   Ht 5\' 5"  (1.651 m)   Wt 229 lb 9.6 oz (104.1 kg)   SpO2 98%   BMI 38.21 kg/m     Wt Readings from Last 3 Encounters:  10/20/19 229 lb 9.6 oz (104.1 kg)  07/13/19 222 lb 6.4 oz (100.9 kg)  11/03/18 224 lb 9.6 oz (101.9 kg)     GEN: She appears much more healthy well nourished, well developed in no acute distress HEENT: Normal NECK: No JVD; No carotid bruits LYMPHATICS: No lymphadenopathy CARDIAC: Soft S1 no S3 RRR, no murmurs, rubs, gallops RESPIRATORY:  Clear to auscultation without rales, wheezing or rhonchi  ABDOMEN: Soft, non-tender, non-distended MUSCULOSKELETAL:  No edema; No deformity  SKIN: Warm and dry NEUROLOGIC:  Alert and oriented x 3 PSYCHIATRIC:  Normal affect    Signed, Shirlee More, MD  10/20/2019 5:04 PM    [place]

## 2019-10-21 ENCOUNTER — Telehealth: Payer: Self-pay | Admitting: Cardiology

## 2019-10-21 LAB — BASIC METABOLIC PANEL
BUN/Creatinine Ratio: 21 (ref 9–23)
BUN: 19 mg/dL (ref 6–24)
CO2: 25 mmol/L (ref 20–29)
Calcium: 9.8 mg/dL (ref 8.7–10.2)
Chloride: 98 mmol/L (ref 96–106)
Creatinine, Ser: 0.92 mg/dL (ref 0.57–1.00)
GFR calc Af Amer: 80 mL/min/{1.73_m2} (ref >59)
GFR calc non Af Amer: 69 mL/min/{1.73_m2} (ref >59)
Glucose: 102 mg/dL — ABNORMAL HIGH (ref 65–99)
Potassium: 4.5 mmol/L (ref 3.5–5.2)
Sodium: 140 mmol/L (ref 134–144)

## 2019-10-21 LAB — PRO B NATRIURETIC PEPTIDE: NT-Pro BNP: 403 pg/mL — ABNORMAL HIGH (ref 0–287)

## 2019-10-21 MED ORDER — JARDIANCE 10 MG PO TABS: 10.0000 mg | ORAL_TABLET | Freq: Every day | ORAL | 3 refills | Status: DC

## 2019-10-21 NOTE — Addendum Note (Signed)
Addended by: Austin Miles on: 10/21/2019 05:42 PM   Modules accepted: Orders

## 2019-10-21 NOTE — Telephone Encounter (Signed)
Returned patient's call who reports that her insurance will not cover farxiga but will cover jardiance or invokana. Spoke with Dr. Bettina Gavia who advised for patient to start jardiance 10 mg daily. Patient is agreeable and verbalized understanding. Prescription sent to [facility] in [place] as requested. No further questions.

## 2019-10-21 NOTE — Telephone Encounter (Signed)
Has questions about medicine that was called in yesterday, she can't afford it

## 2019-11-05 ENCOUNTER — Encounter (HOSPITAL_COMMUNITY): Payer: Self-pay | Admitting: Internal Medicine

## 2019-11-05 ENCOUNTER — Other Ambulatory Visit: Payer: Self-pay

## 2019-11-05 ENCOUNTER — Other Ambulatory Visit: Payer: Self-pay | Admitting: Cardiology

## 2019-11-05 ENCOUNTER — Ambulatory Visit (HOSPITAL_COMMUNITY)
Admission: RE | Admit: 2019-11-05 | Discharge: 2019-11-05 | Disposition: A | Discharge status: Home / Self Care | Payer: PPO | Source: Ambulatory Visit | Attending: Internal Medicine | Admitting: Internal Medicine

## 2019-11-05 VITALS — BP 108/63 | HR 84 | Wt 227.3 lb

## 2019-11-05 DIAGNOSIS — Z87891 Personal history of nicotine dependence: Secondary | ICD-10-CM | POA: Diagnosis not present

## 2019-11-05 DIAGNOSIS — I25119 Atherosclerotic heart disease of native coronary artery with unspecified angina pectoris: Secondary | ICD-10-CM

## 2019-11-05 DIAGNOSIS — Z79899 Other long term (current) drug therapy: Secondary | ICD-10-CM | POA: Diagnosis not present

## 2019-11-05 DIAGNOSIS — Z955 Presence of coronary angioplasty implant and graft: Secondary | ICD-10-CM | POA: Insufficient documentation

## 2019-11-05 DIAGNOSIS — I2510 Atherosclerotic heart disease of native coronary artery without angina pectoris: Secondary | ICD-10-CM | POA: Diagnosis not present

## 2019-11-05 DIAGNOSIS — I110 Hypertensive heart disease with heart failure: Secondary | ICD-10-CM | POA: Insufficient documentation

## 2019-11-05 DIAGNOSIS — Z95810 Presence of automatic (implantable) cardiac defibrillator: Secondary | ICD-10-CM | POA: Insufficient documentation

## 2019-11-05 DIAGNOSIS — Z888 Allergy status to other drugs, medicaments and biological substances status: Secondary | ICD-10-CM | POA: Insufficient documentation

## 2019-11-05 DIAGNOSIS — I5022 Chronic systolic (congestive) heart failure: Secondary | ICD-10-CM | POA: Insufficient documentation

## 2019-11-05 DIAGNOSIS — R002 Palpitations: Secondary | ICD-10-CM | POA: Diagnosis not present

## 2019-11-05 DIAGNOSIS — Z7982 Long term (current) use of aspirin: Secondary | ICD-10-CM | POA: Diagnosis not present

## 2019-11-05 DIAGNOSIS — E785 Hyperlipidemia, unspecified: Secondary | ICD-10-CM | POA: Diagnosis not present

## 2019-11-05 DIAGNOSIS — I252 Old myocardial infarction: Secondary | ICD-10-CM | POA: Diagnosis not present

## 2019-11-05 LAB — BASIC METABOLIC PANEL
Anion gap: 13 (ref 5–15)
BUN: 20 mg/dL (ref 6–20)
CO2: 26 mmol/L (ref 22–32)
Calcium: 9.3 mg/dL (ref 8.9–10.3)
Chloride: 98 mmol/L (ref 98–111)
Creatinine, Ser: 0.95 mg/dL (ref 0.44–1.00)
GFR calc Af Amer: >60 mL/min (ref >60)
GFR calc non Af Amer: >60 mL/min (ref >60)
Glucose, Bld: 104 mg/dL — ABNORMAL HIGH (ref 70–99)
Potassium: 4.7 mmol/L (ref 3.5–5.1)
Sodium: 137 mmol/L (ref 135–145)

## 2019-11-05 LAB — BRAIN NATRIURETIC PEPTIDE: B Natriuretic Peptide: 76.6 pg/mL (ref 0.0–100.0)

## 2019-11-05 MED ORDER — TORSEMIDE 20 MG PO TABS: 20.0000 mg | ORAL_TABLET | Freq: Every evening | ORAL | 6 refills | Status: DC

## 2019-11-05 MED ORDER — TORSEMIDE 100 MG PO TABS: 50.0000 mg | ORAL_TABLET | Freq: Every morning | ORAL | 6 refills | Status: DC

## 2019-11-05 NOTE — Patient Instructions (Signed)
Change Torsemide dosing to:   Torsemide 50 mg (1/2 of 100 mg tab) EVERY AM  Torsemide 20 mg (1 of 20 mg tabs) EVERY PM  Labs done today, we will notify you of abnormal results  Your physician has recommended that you have a cardiopulmonary stress test (CPX). CPX testing is a non-invasive measurement of heart and lung function. It replaces a traditional treadmill stress test. This type of test provides a tremendous amount of information that relates not only to your present condition but also for future outcomes. This test combines measurements of you ventilation, respiratory gas exchange in the lungs, electrocardiogram (EKG), blood pressure and physical response before, during, and following an exercise protocol.  Your physician recommends that you schedule a follow-up appointment in: 2-3 months  If you have any questions or concerns before your next appointment please send [place] a message through [place] or call our office at [phone].  At the [facility], you and your health needs are our priority. As part of our continuing mission to provide you with exceptional heart care, we have created designated Provider Care Teams. These Care Teams include your primary Cardiologist (physician) and Advanced Practice Providers (APPs- Physician Assistants and Nurse Practitioners) who all work together to provide you with the care you need, when you need it.   You may see any of the following providers on your designated Care Team at your next follow up: [name] Dr Glori Bickers . Dr Loralie Champagne . Darrick Grinder, NP . Lyda Jester, PA . Audry Riles, PharmD   Please be sure to bring in all your medications bottles to every appointment.

## 2019-11-05 NOTE — Progress Notes (Signed)
Advanced Heart Failure Clinic Note  Date:  11/05/2019   ID:  Lilla Spotts, DOB January 21, 1961, MRN [id number]  Location: Home  Provider location: [facility] Type of Visit: Established patient  PCP:  Lillard Anes, MD  Cardiologist:  No primary care provider on file. Primary HF: [place]  Chief Complaint: Heart Failure follow-up   History of Present Illness:  Allison White is a 59 y.o. female a h/o morbid obesity, HTN, carotid artery dissection with stenting of left carotid due to possible FMD, CAD s/p anterior MI 1/18 (DES to LAD), systolic HF with EF 0000000 s/p Biotronik ICD who is referred by Dr. Bettina Gavia for assistance with management of her HF.  She presents today for routine f/u. I had a televisit with her a few months ago at which time she seemed to be doing fairly well but reported having more swelling when in the sun or she gets hot. Suspect may be related to vasodilation or sun-sensitivity with meds. We increased torsemide from 50 bid to 100/50 for 1-2 days to see if that would help. Says it didn't really help much.  Here for routine f/u. Dr. Bettina Gavia add Vania Rea. Has been on for 2 weeks. Says she feels dizzy when standing and more fatigued. Still on torsemide 50 bid. Weight stable at 220 at home (was 221-225) Unable to tolerate Entresto 49/51 bid due to SBP 80/40. Now back on 24/26 bid. BP 100-110/50s. No dizziness. No edema, orthopnea or PND.     Monitor 10/19 1. Sinus rhythm 2. Rare PVCs and bigeminy (< 1.0%) 3. Two patient-triggered events associated with isolated PVCs.  4. No high-grade arrhythmias    Echo 06/2017 EF 20-25% Echo 02/2018 LVEF 25-30%, Trivial MR, Normal RV, Mild TR, PA peak pressure 26 mm Hg  CPX 03/17/18 FVC 3.00 (87%), FEV1 2.97(88%), FEV1/FVC 79 (99%)  Peak VO2: 13.8 - When adjusted to the patient's ideal body weight of 142.2 lb (64.5 kg) the peak VO2 is 22.6 ml/kg VE/VCO2 slope: 38 OUES: 1.52 Peak RER:  1.11 Ventilatory Threshold: 11.8 VE/MVV: 95% O2pulse: 8 Interpretation: Mild limitation due to HF and obesity   Allison White denies symptoms worrisome for COVID 19.   Past Medical History:  Diagnosis Date  . Ejection fraction < 50%    23% as of August 2018  . Headache(784.0)   . HTN (hypertension)   . Hyperlipidemia   . MI (myocardial infarction) ([place])   . Nonruptured cerebral aneurysm, internal carotid artery    Left side, stent placement (2009)  . Vertigo    Past Surgical History:  Procedure Laterality Date  . ABDOMINAL HYSTERECTOMY    . CARDIAC DEFIBRILLATOR PLACEMENT    . CEREBRAL ANEURYSM REPAIR Left 2009  . CORONARY ANGIOPLASTY WITH STENT PLACEMENT    . PACEMAKER IMPLANT       Current Outpatient Medications  Medication Sig Dispense Refill  . aspirin 81 MG chewable tablet Chew 81 mg by mouth daily.    [name] atorvastatin (LIPITOR) 20 MG tablet Take 1 tablet by mouth once daily 30 tablet 0  . digoxin (LANOXIN) 0.125 MG tablet Take 1 tablet (125 mcg total) by mouth daily. 30 tablet 1  . empagliflozin (JARDIANCE) 10 MG TABS tablet Take 10 mg by mouth daily before breakfast. 30 tablet 3  . ENTRESTO 24-26 MG Take 1 tablet by mouth twice daily 60 tablet 2  . HYDROcodone-acetaminophen (NORCO) 7.5-325 MG per tablet Take 1 tablet by mouth every 6 (six) hours as  needed for moderate pain.    . metoprolol succinate (TOPROL-XL) 50 MG 24 hr tablet Take 1 tablet by mouth twice daily 180 tablet 3  . nitroGLYCERIN (NITROSTAT) 0.4 MG SL tablet Place 0.4 mg under the tongue every 5 (five) minutes as needed.     . Potassium Chloride ER 20 MEQ TBCR TAKE 1 TABLET BY MOUTH ONCE DAILY, NEEDS OFFICE VISIT FOR FURTHER REFILLS 30 tablet 0  . spironolactone (ALDACTONE) 25 MG tablet Take 1 tablet (25 mg total) by mouth daily. 30 tablet 6  . torsemide (DEMADEX) 100 MG tablet TAKE 1/2 TABLET BY MOUTH TWICE DAILY. PATIENT NEEDS TO SCHEDULE APPT FOR REFILLS 30 tablet 1   No current  facility-administered medications for this encounter.    Allergies:   Clopidogrel, Tape, and Plavix [clopidogrel bisulfate]   Social History:  The patient  reports that she has quit smoking. Her smoking use included cigarettes. She has a 6.00 pack-year smoking history. She has never used smokeless tobacco. She reports current alcohol use. She reports that she does not use drugs.   Family History:  The patient's family history includes Cerebral aneurysm in her paternal grandmother; Hypertension in her brother; Prostate cancer in her father; Thyroid disease in her mother.   ROS:  Please see the history of present illness.   All other systems are personally reviewed and negative.   Vitals:   11/05/19 1342  BP: 108/63  Pulse: 84  SpO2: 97%   There is no height or weight on file to calculate BMI.  Exam:   General:  Well appearing. No resp difficulty HEENT: normal Neck: supple. no JVD. Carotids 2+ bilat; no bruits. No lymphadenopathy or thryomegaly appreciated. Cor: PMI nondisplaced. Regular rate & rhythm. No rubs, gallops or murmurs. Lungs: clear Abdomen: obese soft, nontender, nondistended. No hepatosplenomegaly. No bruits or masses. Good bowel sounds. Extremities: no cyanosis, clubbing, rash, trace edema Neuro: alert & orientedx3, cranial nerves grossly intact. moves all 4 extremities w/o difficulty. Affect pleasant   Recent Labs: 10/20/2019: BUN 19; Creatinine, Ser 0.92; NT-Pro BNP 403; Potassium 4.5; Sodium 140    Wt Readings from Last 3 Encounters:  10/20/19 104.1 kg (229 lb 9.6 oz)  07/13/19 100.9 kg (222 lb 6.4 oz)  11/03/18 101.9 kg (224 lb 9.6 oz)      ASSESSMENT AND PLAN:  1. Chronic systolic HF, ICM - Echo 123XX123 LVEF 25-30%, Trivial MR, Normal RV, Mild TR, PA peak pressure 26 mm Hg - s/p Biotronik ICD - Echo 10/21/19 EF 20-25% - Progressive NYHA III symptoms after addition of Jardiance. ? Volume depletion - Volume status seems low - Continue Entresto 24/26 bid  (failed uptitration due to low BP with SBPs in 70s)  - Decrease torsemide to 50/20 (from 50 bid) can drop to 50 daily if needed - Continue spiro 25 mg daily.  - Continue toprol 50 mg BID - Continue digoxin 0.125 mg. Level 0.4 in 5/20 - Continue jardiance 10 daily for now  - CPX 4/19 with mild limitation from HF and obesity.   - Will plan CPX to further risk stratify for advanced therapies  - labs today  2. CAD s/p anterior MI 1/18 with DES to LAD in [place] - No s/s of ischemia - continue ASA, b-blocker and statin   3. Obesity - Continue weight loss efforts  4. 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 Palpitations - Zio patch looked good - Referred for Sleep study but she cancelled due to cost. Husband said she doesn't snore. I worry OSA may be contributing to her fatigue   Signed, Glori Bickers, MD  11/05/2019 1:17 PM  Advanced Heart Failure Clinic [facility] [place] [facility] [place] [address] [phone] (office) [phone] (fax)

## 2019-11-15 ENCOUNTER — Encounter (HOSPITAL_COMMUNITY): Payer: Self-pay

## 2019-11-26 ENCOUNTER — Encounter (HOSPITAL_COMMUNITY): Payer: Self-pay

## 2019-12-03 ENCOUNTER — Other Ambulatory Visit (HOSPITAL_COMMUNITY): Payer: PPO

## 2019-12-06 ENCOUNTER — Encounter (HOSPITAL_COMMUNITY): Payer: PPO

## 2019-12-14 DIAGNOSIS — I5022 Chronic systolic (congestive) heart failure: Secondary | ICD-10-CM | POA: Diagnosis not present

## 2019-12-14 DIAGNOSIS — E782 Mixed hyperlipidemia: Secondary | ICD-10-CM | POA: Diagnosis not present

## 2019-12-14 DIAGNOSIS — K5732 Diverticulitis of large intestine without perforation or abscess without bleeding: Secondary | ICD-10-CM | POA: Diagnosis not present

## 2019-12-14 DIAGNOSIS — G43109 Migraine with aura, not intractable, without status migrainosus: Secondary | ICD-10-CM | POA: Diagnosis not present

## 2019-12-14 DIAGNOSIS — Z6837 Body mass index (BMI) 37.0-37.9, adult: Secondary | ICD-10-CM | POA: Diagnosis not present

## 2019-12-14 DIAGNOSIS — R7303 Prediabetes: Secondary | ICD-10-CM | POA: Diagnosis not present

## 2019-12-14 DIAGNOSIS — I773 Arterial fibromuscular dysplasia: Secondary | ICD-10-CM | POA: Diagnosis not present

## 2019-12-14 DIAGNOSIS — I10 Essential (primary) hypertension: Secondary | ICD-10-CM | POA: Diagnosis not present

## 2019-12-14 DIAGNOSIS — Z1159 Encounter for screening for other viral diseases: Secondary | ICD-10-CM | POA: Diagnosis not present

## 2019-12-16 ENCOUNTER — Ambulatory Visit (INDEPENDENT_AMBULATORY_CARE_PROVIDER_SITE_OTHER): Payer: PPO | Admitting: *Deleted

## 2019-12-16 DIAGNOSIS — I429 Cardiomyopathy, unspecified: Secondary | ICD-10-CM

## 2019-12-16 LAB — CUP PACEART REMOTE DEVICE CHECK
Battery Voltage: 3.11 V
Date Time Interrogation Session: 20210128091757
HighPow Impedance: 99 Ohm
Implantable Lead Implant Date: 20180410
Implantable Lead Implant Date: 20180410
Implantable Lead Location: 753859
Implantable Lead Location: 753860
Implantable Lead Model: 377
Implantable Lead Model: 402266
Implantable Lead Serial Number: 49794726
Implantable Lead Serial Number: 49838890
Implantable Pulse Generator Implant Date: 20180410
Lead Channel Impedance Value: 536 Ohm
Lead Channel Impedance Value: 594 Ohm
Lead Channel Pacing Threshold Amplitude: 0.6 V
Lead Channel Pacing Threshold Amplitude: 0.6 V
Lead Channel Pacing Threshold Pulse Width: 0.4 ms
Lead Channel Pacing Threshold Pulse Width: 0.4 ms
Lead Channel Sensing Intrinsic Amplitude: 18.4 mV
Lead Channel Sensing Intrinsic Amplitude: 2.6 mV
Lead Channel Setting Pacing Amplitude: 2 V
Lead Channel Setting Pacing Amplitude: 2.5 V
Lead Channel Setting Pacing Pulse Width: 0.4 ms
Lead Channel Setting Sensing Sensitivity: 0.8 mV
Pulse Gen Model: 404622
Pulse Gen Serial Number: 60982098

## 2019-12-16 NOTE — Progress Notes (Signed)
ICD Remote  

## 2019-12-23 ENCOUNTER — Encounter (HOSPITAL_COMMUNITY): Payer: Self-pay

## 2019-12-28 ENCOUNTER — Telehealth (HOSPITAL_COMMUNITY): Payer: Self-pay | Admitting: Pharmacist

## 2019-12-28 NOTE — Telephone Encounter (Signed)
Allison White is non-formulary on her insurance plan. I have submitted a non-formulary exception request and should have a response in 24-72 hours. It will likely be more expensive than the Jardiance if it is approved since it is not preferred.

## 2019-12-28 NOTE — Telephone Encounter (Signed)
Patient Advocate Encounter   Received notification from [employer] that prior authorization for Wilder Glade is required.   PA submitted on CoverMyMeds Key [id number] Status is pending   Will continue to follow.  Audry Riles, PharmD, BCPS, BCCP, CPP Heart Failure Clinic Pharmacist [phone]

## 2019-12-29 ENCOUNTER — Telehealth (HOSPITAL_COMMUNITY): Payer: Self-pay | Admitting: Pharmacy Technician

## 2019-12-29 ENCOUNTER — Telehealth (HOSPITAL_COMMUNITY): Payer: Self-pay | Admitting: Pharmacist

## 2019-12-29 MED ORDER — DAPAGLIFLOZIN PROPANEDIOL 10 MG PO TABS: 10.0000 mg | ORAL_TABLET | Freq: Every day | ORAL | 11 refills | Status: DC

## 2019-12-29 NOTE — Telephone Encounter (Signed)
Wilder Glade has been approved for Non-formulary exception through her insurance. Copay is $90/month. Kathlee Nations will reach out to her and get her a PAN grant to help with copay cost and we will get her switched over.

## 2019-12-29 NOTE — Telephone Encounter (Signed)
Patient is switching from [place] to [place]. 30 day [place] co-pay is $90. Was able to obtain pan grant for patient.  Member ID: [id number] Group ID: [id number] RxBin ID: [id number] PCN: [id number] Eligibility Start Date: 09/30/2019 Eligibility End Date: 12/27/2020 Assistance Amount: $1,000.00  Fund: Heart Failure  Patient is aware of co-pay and grant. Called and confirmed pharmacy was able to bill claim successfully.  Charlann Boxer, CPhT

## 2019-12-29 NOTE — Telephone Encounter (Signed)
Patient complains of dizziness and extreme fatigue with Jardiance. After discussion with Dr. Haroldine Laws, will switch to [place]. Sent new Farxiga prescription to [facility]. Patient aware.   Audry Riles, PharmD, BCPS, BCCP, CPP Heart Failure Clinic Pharmacist [phone]

## 2019-12-29 NOTE — Telephone Encounter (Signed)
Advanced Heart Failure Patient Advocate Encounter  Prior Authorization for Wilder Glade has been approved.    Effective dates: 12/29/19 through 11/17/20  Patients co-pay is $90   Audry Riles, PharmD, BCPS, BCCP, CPP Heart Failure Clinic Pharmacist [phone]

## 2020-02-03 ENCOUNTER — Ambulatory Visit (HOSPITAL_COMMUNITY)
Admission: RE | Admit: 2020-02-03 | Discharge: 2020-02-03 | Disposition: A | Discharge status: Home / Self Care | Payer: PPO | Source: Ambulatory Visit | Attending: Internal Medicine | Admitting: Internal Medicine

## 2020-02-03 ENCOUNTER — Other Ambulatory Visit: Payer: Self-pay

## 2020-02-03 ENCOUNTER — Encounter (HOSPITAL_COMMUNITY): Payer: Self-pay | Admitting: Internal Medicine

## 2020-02-03 VITALS — BP 103/71 | HR 96 | Wt 233.0 lb

## 2020-02-03 DIAGNOSIS — I5022 Chronic systolic (congestive) heart failure: Secondary | ICD-10-CM | POA: Diagnosis not present

## 2020-02-03 DIAGNOSIS — Z7984 Long term (current) use of oral hypoglycemic drugs: Secondary | ICD-10-CM | POA: Insufficient documentation

## 2020-02-03 DIAGNOSIS — Z955 Presence of coronary angioplasty implant and graft: Secondary | ICD-10-CM | POA: Insufficient documentation

## 2020-02-03 DIAGNOSIS — R42 Dizziness and giddiness: Secondary | ICD-10-CM | POA: Diagnosis not present

## 2020-02-03 DIAGNOSIS — Z8249 Family history of ischemic heart disease and other diseases of the circulatory system: Secondary | ICD-10-CM | POA: Insufficient documentation

## 2020-02-03 DIAGNOSIS — Z79899 Other long term (current) drug therapy: Secondary | ICD-10-CM | POA: Insufficient documentation

## 2020-02-03 DIAGNOSIS — Z6838 Body mass index (BMI) 38.0-38.9, adult: Secondary | ICD-10-CM | POA: Diagnosis not present

## 2020-02-03 DIAGNOSIS — R140 Abdominal distension (gaseous): Secondary | ICD-10-CM | POA: Insufficient documentation

## 2020-02-03 DIAGNOSIS — Z7982 Long term (current) use of aspirin: Secondary | ICD-10-CM | POA: Diagnosis not present

## 2020-02-03 DIAGNOSIS — R002 Palpitations: Secondary | ICD-10-CM | POA: Insufficient documentation

## 2020-02-03 DIAGNOSIS — Z95810 Presence of automatic (implantable) cardiac defibrillator: Secondary | ICD-10-CM | POA: Insufficient documentation

## 2020-02-03 DIAGNOSIS — I2510 Atherosclerotic heart disease of native coronary artery without angina pectoris: Secondary | ICD-10-CM | POA: Diagnosis not present

## 2020-02-03 DIAGNOSIS — Z87891 Personal history of nicotine dependence: Secondary | ICD-10-CM | POA: Diagnosis not present

## 2020-02-03 DIAGNOSIS — E785 Hyperlipidemia, unspecified: Secondary | ICD-10-CM | POA: Insufficient documentation

## 2020-02-03 DIAGNOSIS — R000 Tachycardia, unspecified: Secondary | ICD-10-CM | POA: Diagnosis not present

## 2020-02-03 DIAGNOSIS — I252 Old myocardial infarction: Secondary | ICD-10-CM | POA: Insufficient documentation

## 2020-02-03 LAB — COMPREHENSIVE METABOLIC PANEL
ALT: 75 U/L — ABNORMAL HIGH (ref 0–44)
AST: 45 U/L — ABNORMAL HIGH (ref 15–41)
Albumin: 3.9 g/dL (ref 3.5–5.0)
Alkaline Phosphatase: 104 U/L (ref 38–126)
Anion gap: 11 (ref 5–15)
BUN: 20 mg/dL (ref 6–20)
CO2: 26 mmol/L (ref 22–32)
Calcium: 9.2 mg/dL (ref 8.9–10.3)
Chloride: 98 mmol/L (ref 98–111)
Creatinine, Ser: 0.8 mg/dL (ref 0.44–1.00)
GFR calc Af Amer: >60 mL/min (ref >60)
GFR calc non Af Amer: >60 mL/min (ref >60)
Glucose, Bld: 112 mg/dL — ABNORMAL HIGH (ref 70–99)
Potassium: 3.8 mmol/L (ref 3.5–5.1)
Sodium: 135 mmol/L (ref 135–145)
Total Bilirubin: 0.7 mg/dL (ref 0.3–1.2)
Total Protein: 7.2 g/dL (ref 6.5–8.1)

## 2020-02-03 LAB — CBC
HCT: 48.4 % — ABNORMAL HIGH (ref 36.0–46.0)
Hemoglobin: 16.2 g/dL — ABNORMAL HIGH (ref 12.0–15.0)
MCH: 31.3 pg (ref 26.0–34.0)
MCHC: 33.5 g/dL (ref 30.0–36.0)
MCV: 93.6 fL (ref 80.0–100.0)
Platelets: 249 10*3/uL (ref 150–400)
RBC: 5.17 MIL/uL — ABNORMAL HIGH (ref 3.87–5.11)
RDW: 12.9 % (ref 11.5–15.5)
WBC: 8.4 10*3/uL (ref 4.0–10.5)
nRBC: 0 % (ref 0.0–0.2)

## 2020-02-03 LAB — BRAIN NATRIURETIC PEPTIDE: B Natriuretic Peptide: 76.7 pg/mL (ref 0.0–100.0)

## 2020-02-03 LAB — DIGOXIN LEVEL: Digoxin Level: 0.4 ng/mL — ABNORMAL LOW (ref 0.8–2.0)

## 2020-02-03 MED ORDER — TORSEMIDE 100 MG PO TABS: 50.0000 mg | ORAL_TABLET | Freq: Every morning | ORAL | 6 refills | Status: DC

## 2020-02-03 MED ORDER — IVABRADINE HCL 5 MG PO TABS: 5.0000 mg | ORAL_TABLET | Freq: Two times a day (BID) | ORAL | 6 refills | Status: DC

## 2020-02-03 NOTE — Addendum Note (Signed)
Encounter addended by: Scarlette Calico, RN on: 02/03/2020 2:32 PM  Actions taken: Diagnosis association updated, Order list changed, Clinical Note Signed, Charge Capture section accepted

## 2020-02-03 NOTE — Progress Notes (Signed)
Advanced Heart Failure Clinic Note  Date:  02/03/2020   ID:  Allison White, DOB 03-10-1961, MRN [id number]  Location: Home  Provider location: [facility] Type of Visit: Established patient  PCP:  Lillard Anes, MD  Cardiologist:  No primary care provider on file. Primary HF: Allison White  Chief Complaint: Heart Failure follow-up   History of Present Illness:  Allison White is a 59 y.o. female a h/o morbid obesity, HTN, carotid artery dissection with stenting of left carotid due to possible FMD, CAD s/p anterior MI 1/18 (DES to LAD), systolic HF with EF 0000000 s/p Biotronik ICD who is referred by Dr. Bettina Gavia for assistance with management of her HF.  She presents today for routine f/u. I had a televisit with her a few months ago at which time she seemed to be doing fairly well but reported having more swelling when in the sun or she gets hot. Suspect may be related to vasodilation or sun-sensitivity with meds. We increased torsemide from 50 bid to 100/50 for 1-2 days to see if that would help. Says it didn't really help much.  Here for routine f/u. Felt terrible with Jardiance with dizziness, fatigue and orthostatic tachycardia. Now tolerating [place]. Fatigue improving. Able to do her basic activities. + edema in ankles and hands. + ab bloating. Taking torsemide 50 daily but we decreased due to dizziness. Weight up 5-6 pounds at home. Unable to tolerate Entresto 49/51 due to low BP. SBP now 110-115. HR has been high but improving goes to 130 with mild activity    Monitor 10/19 1. Sinus rhythm 2. Rare PVCs and bigeminy (< 1.0%) 3. Two patient-triggered events associated with isolated PVCs.  4. No high-grade arrhythmias    Echo 06/2017 EF 20-25% Echo 02/2018 LVEF 25-30%, Trivial MR, Normal RV, Mild TR, PA peak pressure 26 mm Hg  CPX 03/17/18 FVC 3.00 (87%), FEV1 2.97(88%), FEV1/FVC 79 (99%)  Peak VO2: 13.8 - When adjusted to the patient's  ideal body weight of 142.2 lb (64.5 kg) the peak VO2 is 22.6 ml/kg VE/VCO2 slope: 38 OUES: 1.52 Peak RER: 1.11 Ventilatory Threshold: 11.8 VE/MVV: 95% O2pulse: 8 Interpretation: Mild limitation due to HF and obesity   Aspen Shewmake denies symptoms worrisome for COVID 19.   Past Medical History:  Diagnosis Date  . Ejection fraction < 50%    23% as of August 2018  . Headache(784.0)   . HTN (hypertension)   . Hyperlipidemia   . MI (myocardial infarction) ([place])   . Nonruptured cerebral aneurysm, internal carotid artery    Left side, stent placement (2009)  . Vertigo    Past Surgical History:  Procedure Laterality Date  . ABDOMINAL HYSTERECTOMY    . CARDIAC DEFIBRILLATOR PLACEMENT    . CEREBRAL ANEURYSM REPAIR Left 2009  . CORONARY ANGIOPLASTY WITH STENT PLACEMENT    . PACEMAKER IMPLANT       Current Outpatient Medications  Medication Sig Dispense Refill  . aspirin 81 MG chewable tablet Chew 81 mg by mouth daily.    [name] atorvastatin (LIPITOR) 20 MG tablet Take 1 tablet by mouth once daily 30 tablet 0  . dapagliflozin propanediol (FARXIGA) 10 MG TABS tablet Take 10 mg by mouth daily before breakfast. 30 tablet 11  . digoxin (LANOXIN) 0.125 MG tablet TAKE 1 TABLET BY MOUTH ONCE DAILY . APPOINTMENT REQUIRED FOR FUTURE REFILLS 30 tablet 6  . ENTRESTO 24-26 MG Take 1 tablet by mouth twice daily 60 tablet 2  .  HYDROcodone-acetaminophen (NORCO) 7.5-325 MG per tablet Take 1 tablet by mouth every 6 (six) hours as needed for moderate pain.    . metoprolol succinate (TOPROL-XL) 50 MG 24 hr tablet Take 1 tablet by mouth twice daily 180 tablet 3  . nitroGLYCERIN (NITROSTAT) 0.4 MG SL tablet Place 0.4 mg under the tongue every 5 (five) minutes as needed.     . Potassium Chloride ER 20 MEQ TBCR TAKE 1 TABLET BY MOUTH ONCE DAILY, NEEDS OFFICE VISIT FOR FURTHER REFILLS 30 tablet 0  . spironolactone (ALDACTONE) 25 MG tablet Take 1 tablet (25 mg total) by mouth daily. 30 tablet 6  . torsemide  (DEMADEX) 100 MG tablet Take 0.5 tablets (50 mg total) by mouth every morning. 30 tablet 6   No current facility-administered medications for this encounter.    Allergies:   Clopidogrel, Tape, and Plavix [clopidogrel bisulfate]   Social History:  The patient  reports that she has quit smoking. Her smoking use included cigarettes. She has a 6.00 pack-year smoking history. She has never used smokeless tobacco. She reports current alcohol use. She reports that she does not use drugs.   Family History:  The patient's family history includes Cerebral aneurysm in her paternal grandmother; Hypertension in her brother; Prostate cancer in her father; Thyroid disease in her mother.   ROS:  Please see the history of present illness.   All other systems are personally reviewed and negative.   Vitals:   02/03/20 1356  BP: 103/71  Pulse: 96  SpO2: 95%   Body mass index is 38.77 kg/m.  Exam:   General:  Well appearing. No resp difficulty HEENT: normal Neck: supple. no JVD. Carotids 2+ bilat; no bruits. No lymphadenopathy or thryomegaly appreciated. Cor: PMI nondisplaced. Regular rate & rhythm. No rubs, gallops or murmurs. Lungs: clear Abdomen: obese  soft, nontender, nondistended. No hepatosplenomegaly. No bruits or masses. Good bowel sounds. Extremities: no cyanosis, clubbing, rash, 2+ edema cool Neuro: alert & orientedx3, cranial nerves grossly intact. moves all 4 extremities w/o difficulty. Affect pleasant    Recent Labs: 10/20/2019: NT-Pro BNP 403 11/05/2019: B Natriuretic Peptide 76.6; BUN 20; Creatinine, Ser 0.95; Potassium 4.7; Sodium 137    Wt Readings from Last 3 Encounters:  02/03/20 105.7 kg (233 lb)  11/05/19 103.1 kg (227 lb 4.8 oz)  10/20/19 104.1 kg (229 lb 9.6 oz)      ASSESSMENT AND PLAN:  1. Chronic systolic HF, ICM - Echo 123XX123 LVEF 25-30%, Trivial MR, Normal RV, Mild TR, PA peak pressure 26 mm Hg - s/p Biotronik ICD - Echo 10/21/19 EF 20-25% - Somewhat better  today but still NYHA III - Volume status is up today. Can take torsemide extra 50mg  at night for 2 days and as needed - Continue Entresto 24/26 bid (failed uptitration due to low BP with SBPs in 70s) - Continue spiro 25 mg daily.  - Continue toprol 50 mg BID - Continue digoxin 0.125 mg. Check level today - Continue Farxiga - Add ivabradine 5 bid for tachycardia - CPX 4/19 with mild limitation from HF and obesity.   - Will need CPX in near future to further risk stratify for advanced therapies  - Labs today  2. CAD s/p anterior MI 1/18 with DES to LAD in [place] - No s/s of ischemia - continue ASA, b-blocker and statin   3. Obesity - Continue weight loss efforts  4. 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 Palpitations - Zio patch looked good - Referred for Sleep study but she cancelled due to cost. Husband said she doesn't snore. I worry OSA may be contributing to her fatigue. Can revisit as needed  Signed, Glori Bickers, MD  02/03/2020 2:14 PM  Advanced Heart Failure [place] [address] [facility] [place] [address] [phone] (office) [phone] (fax)

## 2020-02-03 NOTE — Patient Instructions (Signed)
Start Corlanor 5 mg Twice daily   Continue Torsemide 50 mg (1/2 tab) daily, can take an extra 1/2 tab in PM AS NEEDED--maybe 2-3 times a week  Labs done today, your results will be available in MyChart, we will contact you for abnormal readings.  Your physician recommends that you schedule a follow-up appointment in: 2 months  If you have any questions or concerns before your next appointment please send [place] a message through [place] or call our office at [phone].  At the [facility], you and your health needs are our priority. As part of our continuing mission to provide you with exceptional heart care, we have created designated Provider Care Teams. These Care Teams include your primary Cardiologist (physician) and Advanced Practice Providers (APPs- Physician Assistants and Nurse Practitioners) who all work together to provide you with the care you need, when you need it.   You may see any of the following providers on your designated Care Team at your next follow up: [name] Dr Glori Bickers . Dr Loralie Champagne . Darrick Grinder, NP . Lyda Jester, PA . Audry Riles, PharmD   Please be sure to bring in all your medications bottles to every appointment.

## 2020-02-04 ENCOUNTER — Telehealth (HOSPITAL_COMMUNITY): Payer: Self-pay | Admitting: Pharmacist

## 2020-02-04 NOTE — Telephone Encounter (Signed)
Advanced Heart Failure Patient Advocate Encounter  Prior Authorization for Corlanor has been approved.    Effective dates: 02/04/20 through 02/03/21  Audry Riles, PharmD, BCPS, BCCP, CPP Heart Failure Clinic Pharmacist [phone]

## 2020-02-04 NOTE — Telephone Encounter (Signed)
Patient Advocate Encounter   Received notification from [employer] that prior authorization for Corlanor is required.   PA submitted on CoverMyMeds Key [id number] Status is pending   Will continue to follow.  Audry Riles, PharmD, BCPS, BCCP, CPP Heart Failure Clinic Pharmacist [phone]

## 2020-02-06 ENCOUNTER — Other Ambulatory Visit: Payer: Self-pay | Admitting: Legal Medicine

## 2020-02-17 ENCOUNTER — Other Ambulatory Visit: Payer: Self-pay | Admitting: Legal Medicine

## 2020-02-17 ENCOUNTER — Other Ambulatory Visit: Payer: Self-pay

## 2020-02-17 MED ORDER — HYDROCODONE-ACETAMINOPHEN 7.5-325 MG PO TABS: 1.0000 | ORAL_TABLET | Freq: Four times a day (QID) | ORAL | 0 refills | Status: DC | PRN

## 2020-03-02 ENCOUNTER — Telehealth (HOSPITAL_COMMUNITY): Payer: Self-pay | Admitting: Pharmacist

## 2020-03-02 NOTE — Telephone Encounter (Signed)
Received message from Ms. Werner Lean that she is now in the Medicare Coverage gap and Entresto and Corlanor are too expensive. We had previously obtained a PAN grant for her Wilder Glade. I informed her that she can use the grant for the [facility] and the Corlanor too. I offered to help her apply for manufacturer's assistance, but she says they will not qualify this year as they had to use some extra funding from her 39k. Plan will be to use the PAN grant for Corlanor and pay out of pocket for Entresto. There is a possibility she will go into catastrophic coverage soon and then the copays will be reduced. I instructed her to contact me when the grant runs out to make further plans.   Audry Riles, PharmD, BCPS, BCCP, CPP Heart Failure Clinic Pharmacist [phone]

## 2020-03-16 ENCOUNTER — Ambulatory Visit (INDEPENDENT_AMBULATORY_CARE_PROVIDER_SITE_OTHER): Payer: PPO | Admitting: *Deleted

## 2020-03-16 DIAGNOSIS — I429 Cardiomyopathy, unspecified: Secondary | ICD-10-CM | POA: Diagnosis not present

## 2020-03-16 LAB — CUP PACEART REMOTE DEVICE CHECK
Battery Remaining Percentage: 87 %
Battery Voltage: 3.11 V
Brady Statistic RA Percent Paced: 7 %
Brady Statistic RV Percent Paced: 1 %
Date Time Interrogation Session: 20210429065540
HighPow Impedance: 99 Ohm
Implantable Lead Implant Date: 20180410
Implantable Lead Implant Date: 20180410
Implantable Lead Location: 753859
Implantable Lead Location: 753860
Implantable Lead Model: 377
Implantable Lead Model: 402266
Implantable Lead Serial Number: 49794726
Implantable Lead Serial Number: 49838890
Implantable Pulse Generator Implant Date: 20180410
Lead Channel Impedance Value: 536 Ohm
Lead Channel Impedance Value: 580 Ohm
Lead Channel Pacing Threshold Amplitude: 0.5 V
Lead Channel Pacing Threshold Amplitude: 0.7 V
Lead Channel Pacing Threshold Pulse Width: 0.4 ms
Lead Channel Pacing Threshold Pulse Width: 0.4 ms
Lead Channel Sensing Intrinsic Amplitude: 18.5 mV
Lead Channel Sensing Intrinsic Amplitude: 2.3 mV
Lead Channel Setting Pacing Amplitude: 2 V
Lead Channel Setting Pacing Amplitude: 2.5 V
Lead Channel Setting Pacing Pulse Width: 0.4 ms
Lead Channel Setting Sensing Sensitivity: 0.8 mV
Pulse Gen Model: 404622
Pulse Gen Serial Number: 60982098

## 2020-03-17 NOTE — Progress Notes (Signed)
ICD Remote  

## 2020-04-04 ENCOUNTER — Ambulatory Visit (HOSPITAL_COMMUNITY)
Admission: RE | Admit: 2020-04-04 | Discharge: 2020-04-04 | Disposition: A | Discharge status: Home / Self Care | Payer: PPO | Source: Ambulatory Visit | Attending: Internal Medicine | Admitting: Internal Medicine

## 2020-04-04 ENCOUNTER — Other Ambulatory Visit: Payer: Self-pay

## 2020-04-04 ENCOUNTER — Other Ambulatory Visit (HOSPITAL_COMMUNITY): Payer: Self-pay | Admitting: *Deleted

## 2020-04-04 ENCOUNTER — Other Ambulatory Visit (HOSPITAL_COMMUNITY)
Admission: RE | Admit: 2020-04-04 | Discharge: 2020-04-04 | Disposition: A | Discharge status: Home / Self Care | Payer: PPO | Source: Ambulatory Visit | Attending: Internal Medicine | Admitting: Internal Medicine

## 2020-04-04 ENCOUNTER — Encounter (HOSPITAL_COMMUNITY): Payer: Self-pay | Admitting: Internal Medicine

## 2020-04-04 VITALS — BP 99/67 | HR 68 | Wt 236.8 lb

## 2020-04-04 DIAGNOSIS — Z8042 Family history of malignant neoplasm of prostate: Secondary | ICD-10-CM | POA: Insufficient documentation

## 2020-04-04 DIAGNOSIS — E785 Hyperlipidemia, unspecified: Secondary | ICD-10-CM | POA: Diagnosis not present

## 2020-04-04 DIAGNOSIS — I5022 Chronic systolic (congestive) heart failure: Secondary | ICD-10-CM

## 2020-04-04 DIAGNOSIS — Z6839 Body mass index (BMI) 39.0-39.9, adult: Secondary | ICD-10-CM | POA: Diagnosis not present

## 2020-04-04 DIAGNOSIS — I2510 Atherosclerotic heart disease of native coronary artery without angina pectoris: Secondary | ICD-10-CM

## 2020-04-04 DIAGNOSIS — I671 Cerebral aneurysm, nonruptured: Secondary | ICD-10-CM | POA: Insufficient documentation

## 2020-04-04 DIAGNOSIS — Z888 Allergy status to other drugs, medicaments and biological substances status: Secondary | ICD-10-CM | POA: Insufficient documentation

## 2020-04-04 DIAGNOSIS — Z8249 Family history of ischemic heart disease and other diseases of the circulatory system: Secondary | ICD-10-CM | POA: Diagnosis not present

## 2020-04-04 DIAGNOSIS — Z95810 Presence of automatic (implantable) cardiac defibrillator: Secondary | ICD-10-CM | POA: Diagnosis not present

## 2020-04-04 DIAGNOSIS — Z87891 Personal history of nicotine dependence: Secondary | ICD-10-CM | POA: Insufficient documentation

## 2020-04-04 DIAGNOSIS — Z955 Presence of coronary angioplasty implant and graft: Secondary | ICD-10-CM | POA: Diagnosis not present

## 2020-04-04 DIAGNOSIS — I252 Old myocardial infarction: Secondary | ICD-10-CM | POA: Diagnosis not present

## 2020-04-04 DIAGNOSIS — R002 Palpitations: Secondary | ICD-10-CM | POA: Insufficient documentation

## 2020-04-04 DIAGNOSIS — R42 Dizziness and giddiness: Secondary | ICD-10-CM | POA: Insufficient documentation

## 2020-04-04 DIAGNOSIS — Z20822 Contact with and (suspected) exposure to covid-19: Secondary | ICD-10-CM | POA: Diagnosis not present

## 2020-04-04 DIAGNOSIS — Z8349 Family history of other endocrine, nutritional and metabolic diseases: Secondary | ICD-10-CM | POA: Insufficient documentation

## 2020-04-04 DIAGNOSIS — Z7984 Long term (current) use of oral hypoglycemic drugs: Secondary | ICD-10-CM | POA: Insufficient documentation

## 2020-04-04 DIAGNOSIS — Z79899 Other long term (current) drug therapy: Secondary | ICD-10-CM | POA: Diagnosis not present

## 2020-04-04 DIAGNOSIS — Z01818 Encounter for other preprocedural examination: Secondary | ICD-10-CM | POA: Diagnosis not present

## 2020-04-04 DIAGNOSIS — Z7982 Long term (current) use of aspirin: Secondary | ICD-10-CM | POA: Insufficient documentation

## 2020-04-04 LAB — BASIC METABOLIC PANEL
Anion gap: 12 (ref 5–15)
BUN: 19 mg/dL (ref 6–20)
CO2: 26 mmol/L (ref 22–32)
Calcium: 9.4 mg/dL (ref 8.9–10.3)
Chloride: 99 mmol/L (ref 98–111)
Creatinine, Ser: 0.91 mg/dL (ref 0.44–1.00)
GFR calc Af Amer: >60 mL/min (ref >60)
GFR calc non Af Amer: >60 mL/min (ref >60)
Glucose, Bld: 100 mg/dL — ABNORMAL HIGH (ref 70–99)
Potassium: 4.5 mmol/L (ref 3.5–5.1)
Sodium: 137 mmol/L (ref 135–145)

## 2020-04-04 LAB — CBC
HCT: 48.2 % — ABNORMAL HIGH (ref 36.0–46.0)
Hemoglobin: 15.9 g/dL — ABNORMAL HIGH (ref 12.0–15.0)
MCH: 31.4 pg (ref 26.0–34.0)
MCHC: 33 g/dL (ref 30.0–36.0)
MCV: 95.1 fL (ref 80.0–100.0)
Platelets: 253 10*3/uL (ref 150–400)
RBC: 5.07 MIL/uL (ref 3.87–5.11)
RDW: 13 % (ref 11.5–15.5)
WBC: 8.1 10*3/uL (ref 4.0–10.5)
nRBC: 0 % (ref 0.0–0.2)

## 2020-04-04 LAB — PROTIME-INR
INR: 1 (ref 0.8–1.2)
Prothrombin Time: 12.8 seconds (ref 11.4–15.2)

## 2020-04-04 LAB — BRAIN NATRIURETIC PEPTIDE: B Natriuretic Peptide: 85.7 pg/mL (ref 0.0–100.0)

## 2020-04-04 LAB — SARS CORONAVIRUS 2 (TAT 6-24 HRS): SARS Coronavirus 2: NEGATIVE

## 2020-04-04 MED ORDER — NITROGLYCERIN 0.4 MG SL SUBL: 0.4000 mg | SUBLINGUAL_TABLET | SUBLINGUAL | 3 refills | Status: DC | PRN

## 2020-04-04 MED ORDER — SODIUM CHLORIDE 0.9% FLUSH: 3.0000 mL | Freq: Two times a day (BID) | INTRAVENOUS | Status: DC

## 2020-04-04 NOTE — H&P (View-Only) (Signed)
[facility] Clinic Note  Date:  04/04/2020   ID:  Allison White, DOB 07-22-1961, MRN [id number]  Location: Home  Provider location: [facility] Type of Visit: Established patient  PCP:  Allison Anes, MD  Cardiologist:  No primary care provider on file. Primary HF: Allison White  Chief Complaint: Heart Failure follow-up   History of Present Illness:  Allison White is a 59 y.o. female a h/o morbid obesity, HTN, carotid artery dissection with stenting of left carotid due to possible FMD, CAD s/p anterior MI 1/18 (DES to LAD), systolic HF with EF 0000000 s/p Biotronik ICD who is referred by Dr. Bettina White for assistance with management of her HF.  She presents today for routine f/u. I had a televisit with her a few months ago at which time she seemed to be doing fairly well but reported having more swelling when in the sun or she gets hot. Suspect may be related to vasodilation or sun-sensitivity with meds. We increased torsemide from 50 bid to 100/50 for 1-2 days to see if that would help. Says it didn't really help much.  Here for routine f/u. Felt terrible with Jardiance with dizziness, fatigue and orthostatic tachycardia. Now tolerating [place]. Says she hasn't been able to get her swelling under control for past 2-3 weeks. Has increased torsemide from 50 daily due to 50 bid. Has chronic dizziness. No presyncope. Watching fluid intake. Gets SOB with mild exertion. Weight up 3 pounds. HR much better on ivabradine. Very fatigued. Can do ADLs in the house but oonce she gets in the sun can't do anything.   Echo 8/20  EF 20-25% RV ok. Personally reviewed   Monitor 10/19 1. Sinus rhythm 2. Rare PVCs and bigeminy (< 1.0%) 3. Two patient-triggered events associated with isolated PVCs.  4. No high-grade arrhythmias    Echo 06/2017 EF 20-25% Echo 02/2018 LVEF 25-30%, Trivial MR, Normal RV, Mild TR, PA peak pressure 26 mm Hg  CPX 03/17/18 FVC  3.00 (87%), FEV1 2.97(88%), FEV1/FVC 79 (99%)  Peak VO2: 13.8 - When adjusted to the patient's ideal body weight of 142.2 lb (64.5 kg) the peak VO2 is 22.6 ml/kg VE/VCO2 slope: 38 OUES: 1.52 Peak RER: 1.11 Ventilatory Threshold: 11.8 VE/MVV: 95% O2pulse: 8 Interpretation: Mild limitation due to HF and obesity   Allison White denies symptoms worrisome for COVID 19.   Past Medical History:  Diagnosis Date  . Ejection fraction < 50%    23% as of August 2018  . Headache(784.0)   . HTN (hypertension)   . Hyperlipidemia   . MI (myocardial infarction) ([place])   . Nonruptured cerebral aneurysm, internal carotid artery    Left side, stent placement (2009)  . Vertigo    Past Surgical History:  Procedure Laterality Date  . ABDOMINAL HYSTERECTOMY    . CARDIAC DEFIBRILLATOR PLACEMENT    . CEREBRAL ANEURYSM REPAIR Left 2009  . CORONARY ANGIOPLASTY WITH STENT PLACEMENT    . PACEMAKER IMPLANT       Current Outpatient Medications  Medication Sig Dispense Refill  . aspirin 81 MG chewable tablet Chew 81 mg by mouth daily.     [name] atorvastatin (LIPITOR) 20 MG tablet Take 1 tablet by mouth once daily 30 tablet 6  . dapagliflozin propanediol (FARXIGA) 10 MG TABS tablet Take 10 mg by mouth daily before breakfast. 30 tablet 11  . digoxin (LANOXIN) 0.125 MG tablet TAKE 1 TABLET BY MOUTH ONCE DAILY . APPOINTMENT REQUIRED FOR FUTURE  REFILLS 30 tablet 6  . ENTRESTO 24-26 MG Take 1 tablet by mouth twice daily 60 tablet 2  . HYDROcodone-acetaminophen (NORCO) 7.5-325 MG tablet Take 1 tablet by mouth every 6 (six) hours as needed for moderate pain. 90 tablet 0  . ivabradine (CORLANOR) 5 MG TABS tablet Take 1 tablet (5 mg total) by mouth 2 (two) times daily with a meal. 60 tablet 6  . metoprolol succinate (TOPROL-XL) 50 MG 24 hr tablet Take 1 tablet by mouth twice daily 180 tablet 3  . nitroGLYCERIN (NITROSTAT) 0.4 MG SL tablet Place 1 tablet (0.4 mg total) under the tongue every 5 (five) minutes as  needed. 25 tablet 3  . Potassium Chloride ER 20 MEQ TBCR TAKE 1 TABLET BY MOUTH ONCE DAILY, NEEDS OFFICE VISIT FOR FURTHER REFILLS 30 tablet 0  . spironolactone (ALDACTONE) 25 MG tablet Take 1 tablet (25 mg total) by mouth daily. 30 tablet 6  . torsemide (DEMADEX) 100 MG tablet Take 0.5 tablets (50 mg total) by mouth every morning. Can take extra 0.5 tab in PM AS NEEDED 30 tablet 6   No current facility-administered medications for this encounter.    Allergies:   Tape and Plavix [clopidogrel bisulfate]   Social History:  The patient  reports that she has quit smoking. Her smoking use included cigarettes. She has a 6.00 pack-year smoking history. She has never used smokeless tobacco. She reports current alcohol use. She reports that she does not use drugs.   Family History:  The patient's family history includes Cerebral aneurysm in her paternal grandmother; Hypertension in her brother; Prostate cancer in her father; Thyroid disease in her mother.   ROS:  Please see the history of present illness.   All other systems are personally reviewed and negative.   Body mass index is 39.41 kg/m.  Vitals:   04/04/20 1140  BP: 99/67  Pulse: 68  SpO2: 99%  Weight: 107.4 kg (236 lb 12.8 oz)   Wt Readings from Last 3 Encounters:  02/03/20 105.7 kg (233 lb)  11/05/19 103.1 kg (227 lb 4.8 oz)  10/20/19 104.1 kg (229 lb 9.6 oz)     Exam:   General:  Weak appearing. No resp difficulty HEENT: normal Neck: supple. JVP hard to see. Carotids 2+ bilat; no bruits. No lymphadenopathy or thryomegaly appreciated. Cor: PMI nondisplaced. Regular rate & rhythm. No rubs, gallops or murmurs. Lungs: clear Abdomen: obesesoft, nontender, nondistended. No hepatosplenomegaly. No bruits or masses. Good bowel sounds. Extremities: no cyanosis, clubbing, rash, tr-1+ edema Neuro: alert & orientedx3, cranial nerves grossly intact. moves all 4 extremities w/o difficulty. Affect pleasant    Recent Labs: 10/20/2019:  NT-Pro BNP 403 02/03/2020: ALT 75; B Natriuretic Peptide 76.7; BUN 20; Creatinine, Ser 0.80; Hemoglobin 16.2; Platelets 249; Potassium 3.8; Sodium 135    Wt Readings from Last 3 Encounters:  04/04/20 107.4 kg (236 lb 12.8 oz)  02/03/20 105.7 kg (233 lb)  11/05/19 103.1 kg (227 lb 4.8 oz)      ASSESSMENT AND PLAN:  1. Chronic systolic HF, ICM - Echo 123XX123 LVEF 25-30%, Trivial MR, Normal RV, Mild TR, PA peak pressure 26 mm Hg - s/p Biotronik ICD - Echo 10/21/19 EF 20-25% - Continues to deteriorate. Now NYHA IIIb - Continue Entresto 24/26 bid (failed uptitration due to low BP with SBPs in 70s) - Continue spiro 25 mg daily.  - Continue toprol 50 mg BID - Continue digoxin 0.125 mg.  - Continue Farxiga - Continue ivabradine 5 bid - tachycardia MUCH  improved - CPX 4/19 with mild limitation from HF and obesity.   - She is clearly worse today with mild volume overload and progressive HF symptoms. Concern for possible low output. Long discussion about need to further risk stratify for advanced therapies.  - Start with R/L cath this week  2. CAD s/p anterior MI 1/18 with DES to LAD in [place] - With worsening HF symptoms will plan repeat R and LHC this week - continue ASA, b-blocker and statin   3. Obesity - Continue weight loss efforts  4. 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 No changes  6. Palpitations - Zio patch looked good - Referred for Sleep study but she cancelled due to cost. Husband said she doesn't snore. I worry OSA may be contributing to her fatigue. Can revisit as needed.   Total time spent 40 minutes. Over half that time spent discussing above.    Signed, Glori Bickers, MD  04/04/2020 12:03 PM  [facility] [place] [address] [facility] [place]  [id number] [phone] (office) [phone] (fax)

## 2020-04-04 NOTE — Progress Notes (Signed)
ReDS Vest / Clip - 04/04/20 1200      ReDS Vest / Clip   Station Marker  B    Ruler Value  32    ReDS Value Range  Low volume    ReDS Actual Value  28    Anatomical Comments  sitting

## 2020-04-04 NOTE — Patient Instructions (Signed)
Labs done today, your results will be available in MyChart, we will contact you for abnormal readings.  Your physician has requested that you have a cardiac catheterization. Cardiac catheterization is used to diagnose and/or treat various heart conditions. Doctors may recommend this procedure for a number of different reasons. The most common reason is to evaluate chest pain. Chest pain can be a symptom of coronary artery disease (CAD), and cardiac catheterization can show whether plaque is narrowing or blocking your heart's arteries. This procedure is also used to evaluate the valves, as well as measure the blood flow and oxygen levels in different parts of your heart. For further information please visit [email or web address]. Please follow instruction sheet, as given.   Your physician recommends that you schedule a follow-up appointment in: 2 MONTHS  CATHETERIZATION INSTRUCTIONS:  You are scheduled for a Cardiac Catheterization on Friday, May 21 with Dr. Glori Bickers.  1. Please arrive at the [facility] (Main Entrance A) at [facility]: [address] [place], [place] [address] at 10:00 AM (This time is two hours before your procedure to ensure your preparation). Free valet parking service is available.   Special note: Every effort is made to have your procedure done on time. Please understand that emergencies sometimes delay scheduled procedures.  2. Diet: Do not eat solid foods after midnight.  The patient may have clear liquids until 5am upon the day of the procedure.  3. COVID TEST: TODAY AT [address], SEE DIRECTION SHEET ATTACHED  4. Medication instructions in preparation for your procedure:   FRI 5/21 AM--DO NOT TAKE Farxiga, Torsemide or Spironolactone  On the morning of your procedure, take your Aspirin and any morning medicines NOT listed above.  You may use sips of water.  5. Plan for one night stay--bring personal belongings. 6. Bring a current list of your  medications and current insurance cards. 7. You MUST have a responsible person to drive you home. 8. Someone MUST be with you the first 24 hours after you arrive home or your discharge will be delayed. 9. Please wear clothes that are easy to get on and off and wear slip-on shoes.  Thank you for allowing [place] to care for you!   -- [facility]

## 2020-04-04 NOTE — Progress Notes (Signed)
Advanced [facility] Failure Clinic Note  Date:  04/04/2020   ID:  Lameika Qasem, DOB 06-Nov-1961, MRN [id number]  Location: Home  Provider location: [facility] Type of Visit: Established patient  PCP:  Lillard Anes, MD  Cardiologist:  No primary care provider on file. Primary HF: Alayjah Boehringer  Chief Complaint: Heart Failure follow-up   History of Present Illness:  Allison White is a 59 y.o. female a h/o morbid obesity, HTN, carotid artery dissection with stenting of left carotid due to possible FMD, CAD s/p anterior MI 1/18 (DES to LAD), systolic HF with EF 0000000 s/p Biotronik ICD who is referred by Dr. Bettina Gavia for assistance with management of her HF.  She presents today for routine f/u. I had a televisit with her a few months ago at which time she seemed to be doing fairly well but reported having more swelling when in the sun or she gets hot. Suspect may be related to vasodilation or sun-sensitivity with meds. We increased torsemide from 50 bid to 100/50 for 1-2 days to see if that would help. Says it didn't really help much.  Here for routine f/u. Felt terrible with Jardiance with dizziness, fatigue and orthostatic tachycardia. Now tolerating [place]. Says she hasn't been able to get her swelling under control for past 2-3 weeks. Has increased torsemide from 50 daily due to 50 bid. Has chronic dizziness. No presyncope. Watching fluid intake. Gets SOB with mild exertion. Weight up 3 pounds. HR much better on ivabradine. Very fatigued. Can do ADLs in the house but oonce she gets in the sun can't do anything.   Echo 8/20  EF 20-25% RV ok. Personally reviewed   Monitor 10/19 1. Sinus rhythm 2. Rare PVCs and bigeminy (< 1.0%) 3. Two patient-triggered events associated with isolated PVCs.  4. No high-grade arrhythmias    Echo 06/2017 EF 20-25% Echo 02/2018 LVEF 25-30%, Trivial MR, Normal RV, Mild TR, PA peak pressure 26 mm Hg  CPX 03/17/18 FVC  3.00 (87%), FEV1 2.97(88%), FEV1/FVC 79 (99%)  Peak VO2: 13.8 - When adjusted to the patient's ideal body weight of 142.2 lb (64.5 kg) the peak VO2 is 22.6 ml/kg VE/VCO2 slope: 38 OUES: 1.52 Peak RER: 1.11 Ventilatory Threshold: 11.8 VE/MVV: 95% O2pulse: 8 Interpretation: Mild limitation due to HF and obesity   Ezrah Rittenour denies symptoms worrisome for COVID 19.   Past Medical History:  Diagnosis Date  . Ejection fraction < 50%    23% as of August 2018  . Headache(784.0)   . HTN (hypertension)   . Hyperlipidemia   . MI (myocardial infarction) ([place])   . Nonruptured cerebral aneurysm, internal carotid artery    Left side, stent placement (2009)  . Vertigo    Past Surgical History:  Procedure Laterality Date  . ABDOMINAL HYSTERECTOMY    . CARDIAC DEFIBRILLATOR PLACEMENT    . CEREBRAL ANEURYSM REPAIR Left 2009  . CORONARY ANGIOPLASTY WITH STENT PLACEMENT    . PACEMAKER IMPLANT       Current Outpatient Medications  Medication Sig Dispense Refill  . aspirin 81 MG chewable tablet Chew 81 mg by mouth daily.     [name] atorvastatin (LIPITOR) 20 MG tablet Take 1 tablet by mouth once daily 30 tablet 6  . dapagliflozin propanediol (FARXIGA) 10 MG TABS tablet Take 10 mg by mouth daily before breakfast. 30 tablet 11  . digoxin (LANOXIN) 0.125 MG tablet TAKE 1 TABLET BY MOUTH ONCE DAILY . APPOINTMENT REQUIRED FOR FUTURE  REFILLS 30 tablet 6  . ENTRESTO 24-26 MG Take 1 tablet by mouth twice daily 60 tablet 2  . HYDROcodone-acetaminophen (NORCO) 7.5-325 MG tablet Take 1 tablet by mouth every 6 (six) hours as needed for moderate pain. 90 tablet 0  . ivabradine (CORLANOR) 5 MG TABS tablet Take 1 tablet (5 mg total) by mouth 2 (two) times daily with a meal. 60 tablet 6  . metoprolol succinate (TOPROL-XL) 50 MG 24 hr tablet Take 1 tablet by mouth twice daily 180 tablet 3  . nitroGLYCERIN (NITROSTAT) 0.4 MG SL tablet Place 1 tablet (0.4 mg total) under the tongue every 5 (five) minutes as  needed. 25 tablet 3  . Potassium Chloride ER 20 MEQ TBCR TAKE 1 TABLET BY MOUTH ONCE DAILY, NEEDS OFFICE VISIT FOR FURTHER REFILLS 30 tablet 0  . spironolactone (ALDACTONE) 25 MG tablet Take 1 tablet (25 mg total) by mouth daily. 30 tablet 6  . torsemide (DEMADEX) 100 MG tablet Take 0.5 tablets (50 mg total) by mouth every morning. Can take extra 0.5 tab in PM AS NEEDED 30 tablet 6   No current facility-administered medications for this encounter.    Allergies:   Tape and Plavix [clopidogrel bisulfate]   Social History:  The patient  reports that she has quit smoking. Her smoking use included cigarettes. She has a 6.00 pack-year smoking history. She has never used smokeless tobacco. She reports current alcohol use. She reports that she does not use drugs.   Family History:  The patient's family history includes Cerebral aneurysm in her paternal grandmother; Hypertension in her brother; Prostate cancer in her father; Thyroid disease in her mother.   ROS:  Please see the history of present illness.   All other systems are personally reviewed and negative.   Body mass index is 39.41 kg/m.  Vitals:   04/04/20 1140  BP: 99/67  Pulse: 68  SpO2: 99%  Weight: 107.4 kg (236 lb 12.8 oz)   Wt Readings from Last 3 Encounters:  02/03/20 105.7 kg (233 lb)  11/05/19 103.1 kg (227 lb 4.8 oz)  10/20/19 104.1 kg (229 lb 9.6 oz)     Exam:   General:  Weak appearing. No resp difficulty HEENT: normal Neck: supple. JVP hard to see. Carotids 2+ bilat; no bruits. No lymphadenopathy or thryomegaly appreciated. Cor: PMI nondisplaced. Regular rate & rhythm. No rubs, gallops or murmurs. Lungs: clear Abdomen: obesesoft, nontender, nondistended. No hepatosplenomegaly. No bruits or masses. Good bowel sounds. Extremities: no cyanosis, clubbing, rash, tr-1+ edema Neuro: alert & orientedx3, cranial nerves grossly intact. moves all 4 extremities w/o difficulty. Affect pleasant    Recent Labs: 10/20/2019:  NT-Pro BNP 403 02/03/2020: ALT 75; B Natriuretic Peptide 76.7; BUN 20; Creatinine, Ser 0.80; Hemoglobin 16.2; Platelets 249; Potassium 3.8; Sodium 135    Wt Readings from Last 3 Encounters:  04/04/20 107.4 kg (236 lb 12.8 oz)  02/03/20 105.7 kg (233 lb)  11/05/19 103.1 kg (227 lb 4.8 oz)      ASSESSMENT AND PLAN:  1. Chronic systolic HF, ICM - Echo 123XX123 LVEF 25-30%, Trivial MR, Normal RV, Mild TR, PA peak pressure 26 mm Hg - s/p Biotronik ICD - Echo 10/21/19 EF 20-25% - Continues to deteriorate. Now NYHA IIIb - Continue Entresto 24/26 bid (failed uptitration due to low BP with SBPs in 70s) - Continue spiro 25 mg daily.  - Continue toprol 50 mg BID - Continue digoxin 0.125 mg.  - Continue Farxiga - Continue ivabradine 5 bid - tachycardia MUCH  improved - CPX 4/19 with mild limitation from HF and obesity.   - She is clearly worse today with mild volume overload and progressive HF symptoms. Concern for possible low output. Long discussion about need to further risk stratify for advanced therapies.  - Start with R/L cath this week  2. CAD s/p anterior MI 1/18 with DES to LAD in [place] - With worsening HF symptoms will plan repeat R and LHC this week - continue ASA, b-blocker and statin   3. Obesity - Continue weight loss efforts  4. 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 No changes  6. Palpitations - Zio patch looked good - Referred for Sleep study but she cancelled due to cost. Husband said she doesn't snore. I worry OSA may be contributing to her fatigue. Can revisit as needed.   Total time spent 40 minutes. Over half that time spent discussing above.    Signed, Glori Bickers, MD  04/04/2020 12:03 PM  [facility] [place] [address] [facility] [place]  [phone] (office) [phone] (fax)

## 2020-04-07 ENCOUNTER — Encounter (HOSPITAL_COMMUNITY)
Admission: RE | Disposition: A | Discharge status: Home / Self Care | Payer: Self-pay | Source: Ambulatory Visit | Attending: Internal Medicine

## 2020-04-07 ENCOUNTER — Ambulatory Visit (HOSPITAL_COMMUNITY)
Admission: RE | Admit: 2020-04-07 | Discharge: 2020-04-07 | Disposition: A | Discharge status: Home / Self Care | Payer: PPO | Source: Ambulatory Visit | Attending: Internal Medicine | Admitting: Internal Medicine

## 2020-04-07 ENCOUNTER — Other Ambulatory Visit: Payer: Self-pay

## 2020-04-07 DIAGNOSIS — I252 Old myocardial infarction: Secondary | ICD-10-CM | POA: Diagnosis not present

## 2020-04-07 DIAGNOSIS — Z95810 Presence of automatic (implantable) cardiac defibrillator: Secondary | ICD-10-CM | POA: Diagnosis not present

## 2020-04-07 DIAGNOSIS — R42 Dizziness and giddiness: Secondary | ICD-10-CM | POA: Insufficient documentation

## 2020-04-07 DIAGNOSIS — I5022 Chronic systolic (congestive) heart failure: Secondary | ICD-10-CM | POA: Diagnosis not present

## 2020-04-07 DIAGNOSIS — Z888 Allergy status to other drugs, medicaments and biological substances status: Secondary | ICD-10-CM | POA: Diagnosis not present

## 2020-04-07 DIAGNOSIS — Z79899 Other long term (current) drug therapy: Secondary | ICD-10-CM | POA: Insufficient documentation

## 2020-04-07 DIAGNOSIS — I2510 Atherosclerotic heart disease of native coronary artery without angina pectoris: Secondary | ICD-10-CM | POA: Insufficient documentation

## 2020-04-07 DIAGNOSIS — R002 Palpitations: Secondary | ICD-10-CM | POA: Insufficient documentation

## 2020-04-07 DIAGNOSIS — Z955 Presence of coronary angioplasty implant and graft: Secondary | ICD-10-CM | POA: Insufficient documentation

## 2020-04-07 DIAGNOSIS — I255 Ischemic cardiomyopathy: Secondary | ICD-10-CM | POA: Diagnosis not present

## 2020-04-07 DIAGNOSIS — I110 Hypertensive heart disease with heart failure: Secondary | ICD-10-CM | POA: Insufficient documentation

## 2020-04-07 DIAGNOSIS — E785 Hyperlipidemia, unspecified: Secondary | ICD-10-CM | POA: Insufficient documentation

## 2020-04-07 DIAGNOSIS — Z7982 Long term (current) use of aspirin: Secondary | ICD-10-CM | POA: Diagnosis not present

## 2020-04-07 DIAGNOSIS — Z87891 Personal history of nicotine dependence: Secondary | ICD-10-CM | POA: Insufficient documentation

## 2020-04-07 DIAGNOSIS — Z7984 Long term (current) use of oral hypoglycemic drugs: Secondary | ICD-10-CM | POA: Insufficient documentation

## 2020-04-07 DIAGNOSIS — Z6839 Body mass index (BMI) 39.0-39.9, adult: Secondary | ICD-10-CM | POA: Insufficient documentation

## 2020-04-07 HISTORY — PX: RIGHT/LEFT HEART CATH AND CORONARY ANGIOGRAPHY: CATH118266

## 2020-04-07 LAB — POCT I-STAT EG7
Acid-Base Excess: 4 mmol/L — ABNORMAL HIGH (ref 0.0–2.0)
Acid-Base Excess: 4 mmol/L — ABNORMAL HIGH (ref 0.0–2.0)
Acid-base deficit: 2 mmol/L (ref 0.0–2.0)
Bicarbonate: 22.9 mmol/L (ref 20.0–28.0)
Bicarbonate: 29.8 mmol/L — ABNORMAL HIGH (ref 20.0–28.0)
Bicarbonate: 30.5 mmol/L — ABNORMAL HIGH (ref 20.0–28.0)
Calcium, Ion: 0.75 mmol/L — CL (ref 1.15–1.40)
Calcium, Ion: 1.2 mmol/L (ref 1.15–1.40)
Calcium, Ion: 1.24 mmol/L (ref 1.15–1.40)
HCT: 34 % — ABNORMAL LOW (ref 36.0–46.0)
HCT: 42 % (ref 36.0–46.0)
HCT: 45 % (ref 36.0–46.0)
Hemoglobin: 11.6 g/dL — ABNORMAL LOW (ref 12.0–15.0)
Hemoglobin: 14.3 g/dL (ref 12.0–15.0)
Hemoglobin: 15.3 g/dL — ABNORMAL HIGH (ref 12.0–15.0)
O2 Saturation: 79 %
O2 Saturation: 81 %
O2 Saturation: 82 %
Potassium: 2.3 mmol/L — CL (ref 3.5–5.1)
Potassium: 3.4 mmol/L — ABNORMAL LOW (ref 3.5–5.1)
Potassium: 3.5 mmol/L (ref 3.5–5.1)
Sodium: 141 mmol/L (ref 135–145)
Sodium: 141 mmol/L (ref 135–145)
Sodium: 150 mmol/L — ABNORMAL HIGH (ref 135–145)
TCO2: 24 mmol/L (ref 22–32)
TCO2: 31 mmol/L (ref 22–32)
TCO2: 32 mmol/L (ref 22–32)
pCO2, Ven: 37.6 mmHg — ABNORMAL LOW (ref 44.0–60.0)
pCO2, Ven: 48.2 mmHg (ref 44.0–60.0)
pCO2, Ven: 49.7 mmHg (ref 44.0–60.0)
pH, Ven: 7.392 (ref 7.250–7.430)
pH, Ven: 7.396 (ref 7.250–7.430)
pH, Ven: 7.399 (ref 7.250–7.430)
pO2, Ven: 44 mmHg (ref 32.0–45.0)
pO2, Ven: 46 mmHg — ABNORMAL HIGH (ref 32.0–45.0)
pO2, Ven: 46 mmHg — ABNORMAL HIGH (ref 32.0–45.0)

## 2020-04-07 LAB — POCT I-STAT 7, (LYTES, BLD GAS, ICA,H+H)
Acid-Base Excess: 3 mmol/L — ABNORMAL HIGH (ref 0.0–2.0)
Bicarbonate: 28.2 mmol/L — ABNORMAL HIGH (ref 20.0–28.0)
Calcium, Ion: 1.17 mmol/L (ref 1.15–1.40)
HCT: 43 % (ref 36.0–46.0)
Hemoglobin: 14.6 g/dL (ref 12.0–15.0)
O2 Saturation: 99 %
Potassium: 3.4 mmol/L — ABNORMAL LOW (ref 3.5–5.1)
Sodium: 141 mmol/L (ref 135–145)
TCO2: 30 mmol/L (ref 22–32)
pCO2 arterial: 42.2 mmHg (ref 32.0–48.0)
pH, Arterial: 7.433 (ref 7.350–7.450)
pO2, Arterial: 132 mmHg — ABNORMAL HIGH (ref 83.0–108.0)

## 2020-04-07 SURGERY — RIGHT/LEFT HEART CATH AND CORONARY ANGIOGRAPHY
Anesthesia: LOCAL

## 2020-04-07 MED ORDER — SODIUM CHLORIDE 0.9 % IV SOLN: 250.0000 mL | INTRAVENOUS | Status: DC | PRN

## 2020-04-07 MED ORDER — SODIUM CHLORIDE 0.9% FLUSH: 3.0000 mL | Freq: Two times a day (BID) | INTRAVENOUS | Status: DC

## 2020-04-07 MED ORDER — LIDOCAINE HCL (PF) 1 % IJ SOLN
INTRAMUSCULAR | Status: AC
  Filled 2020-04-07: qty 30

## 2020-04-07 MED ORDER — ONDANSETRON HCL 4 MG/2ML IJ SOLN: 4.0000 mg | Freq: Four times a day (QID) | INTRAMUSCULAR | Status: DC | PRN

## 2020-04-07 MED ORDER — MIDAZOLAM HCL 2 MG/2ML IJ SOLN
INTRAMUSCULAR | Status: AC
  Filled 2020-04-07: qty 2

## 2020-04-07 MED ORDER — SODIUM CHLORIDE 0.9% FLUSH: 3.0000 mL | INTRAVENOUS | Status: DC | PRN

## 2020-04-07 MED ORDER — FENTANYL CITRATE (PF) 100 MCG/2ML IJ SOLN
INTRAMUSCULAR | Status: DC | PRN
  Administered 2020-04-07: 25 ug via INTRAVENOUS

## 2020-04-07 MED ORDER — IOHEXOL 350 MG/ML SOLN
INTRAVENOUS | Status: DC | PRN
  Administered 2020-04-07: 40 mL

## 2020-04-07 MED ORDER — HEPARIN SODIUM (PORCINE) 1000 UNIT/ML IJ SOLN
INTRAMUSCULAR | Status: AC
  Filled 2020-04-07: qty 1

## 2020-04-07 MED ORDER — ACETAMINOPHEN 325 MG PO TABS: 650.0000 mg | ORAL_TABLET | ORAL | Status: DC | PRN

## 2020-04-07 MED ORDER — LIDOCAINE HCL (PF) 1 % IJ SOLN
INTRAMUSCULAR | Status: DC | PRN
  Administered 2020-04-07 (×2): 2 mL via INTRADERMAL

## 2020-04-07 MED ORDER — HYDRALAZINE HCL 20 MG/ML IJ SOLN: 10.0000 mg | INTRAMUSCULAR | Status: DC | PRN

## 2020-04-07 MED ORDER — VERAPAMIL HCL 2.5 MG/ML IV SOLN
INTRAVENOUS | Status: DC | PRN
  Administered 2020-04-07: 10 mL via INTRA_ARTERIAL

## 2020-04-07 MED ORDER — SODIUM CHLORIDE 0.9 % IV SOLN: INTRAVENOUS | Status: DC

## 2020-04-07 MED ORDER — SODIUM CHLORIDE 0.9 % IV SOLN: INTRAVENOUS | Status: AC

## 2020-04-07 MED ORDER — LABETALOL HCL 5 MG/ML IV SOLN: 10.0000 mg | INTRAVENOUS | Status: DC | PRN

## 2020-04-07 MED ORDER — HEPARIN SODIUM (PORCINE) 1000 UNIT/ML IJ SOLN
INTRAMUSCULAR | Status: DC | PRN
  Administered 2020-04-07: 5000 [IU] via INTRAVENOUS

## 2020-04-07 MED ORDER — HEPARIN (PORCINE) IN NACL 1000-0.9 UT/500ML-% IV SOLN
INTRAVENOUS | Status: AC
  Filled 2020-04-07: qty 1000

## 2020-04-07 MED ORDER — MIDAZOLAM HCL 2 MG/2ML IJ SOLN
INTRAMUSCULAR | Status: DC | PRN
  Administered 2020-04-07: 1 mg via INTRAVENOUS

## 2020-04-07 MED ORDER — VERAPAMIL HCL 2.5 MG/ML IV SOLN
INTRAVENOUS | Status: AC
  Filled 2020-04-07: qty 2

## 2020-04-07 MED ORDER — FENTANYL CITRATE (PF) 100 MCG/2ML IJ SOLN
INTRAMUSCULAR | Status: AC
  Filled 2020-04-07: qty 2

## 2020-04-07 MED ORDER — HEPARIN (PORCINE) IN NACL 1000-0.9 UT/500ML-% IV SOLN
INTRAVENOUS | Status: DC | PRN
  Administered 2020-04-07 (×2): 500 mL

## 2020-04-07 SURGICAL SUPPLY — 10 items
CATH BALLN WEDGE 5F 110CM (CATHETERS) ×1 IMPLANT
CATH IMPULSE 5F ANG/FL3.5 (CATHETERS) ×1 IMPLANT
CATH LAUNCHER 5F JL3 (CATHETERS) IMPLANT
CATHETER LAUNCHER 5F JL3 (CATHETERS) ×2
GLIDESHEATH SLEND SS 6F .021 (SHEATH) ×1 IMPLANT
GUIDEWIRE INQWIRE 1.5J.035X260 (WIRE) IMPLANT
INQWIRE 1.5J .035X260CM (WIRE) ×2
PACK CARDIAC CATHETERIZATION (CUSTOM PROCEDURE TRAY) ×2 IMPLANT
SHEATH GLIDE SLENDER 4/5FR (SHEATH) ×1 IMPLANT
TRANSDUCER W/STOPCOCK (MISCELLANEOUS) ×2 IMPLANT

## 2020-04-07 NOTE — Discharge Instructions (Signed)
Radial Site Care  This sheet gives you information about how to care for yourself after your procedure. Your health care provider may also give you more specific instructions. If you have problems or questions, contact your health care provider. What can I expect after the procedure? After the procedure, it is common to have:  Bruising and tenderness at the catheter insertion area. Follow these instructions at home: Medicines  Take over-the-counter and prescription medicines only as told by your health care provider. Insertion site care  Follow instructions from your health care provider about how to take care of your insertion site. Make sure you: ? Wash your hands with soap and water before you change your bandage (dressing). If soap and water are not available, use hand sanitizer. ? Change your dressing as told by your health care provider. ? Leave stitches (sutures), skin glue, or adhesive strips in place. These skin closures may need to stay in place for 2 weeks or longer. If adhesive strip edges start to loosen and curl up, you may trim the loose edges. Do not remove adhesive strips completely unless your health care provider tells you to do that.  Check your insertion site every day for signs of infection. Check for: ? Redness, swelling, or pain. ? Fluid or blood. ? Pus or a bad smell. ? Warmth.  Do not take baths, swim, or use a hot tub until your health care provider approves.  You may shower 24-48 hours after the procedure, or as directed by your health care provider. ? Remove the dressing and gently wash the site with plain soap and water. ? Pat the area dry with a clean towel. ? Do not rub the site. That could cause bleeding.  Do not apply powder or lotion to the site. Activity   For 24 hours after the procedure, or as directed by your health care provider: ? Do not flex or bend the affected arm. ? Do not push or pull heavy objects with the affected arm. ? Do not  drive yourself home from the hospital or clinic. You may drive 24 hours after the procedure unless your health care provider tells you not to. ? Do not operate machinery or power tools.  Do not lift anything that is heavier than 10 lb (4.5 kg), or the limit that you are told, until your health care provider says that it is safe.  Ask your health care provider when it is okay to: ? Return to work or school. ? Resume usual physical activities or sports. ? Resume sexual activity. General instructions  If the catheter site starts to bleed, raise your arm and put firm pressure on the site. If the bleeding does not stop, get help right away. This is a medical emergency.  If you went home on the same day as your procedure, a responsible adult should be with you for the first 24 hours after you arrive home.  Keep all follow-up visits as told by your health care provider. This is important. Contact a health care provider if:  You have a fever.  You have redness, swelling, or yellow drainage around your insertion site. Get help right away if:  You have unusual pain at the radial site.  The catheter insertion area swells very fast.  The insertion area is bleeding, and the bleeding does not stop when you hold steady pressure on the area.  Your arm or hand becomes pale, cool, tingly, or numb. These symptoms may represent a serious problem   that is an emergency. Do not wait to see if the symptoms will go away. Get medical help right away. Call your local emergency services (911 in the [place] Do not drive yourself to the hospital. Summary  After the procedure, it is common to have bruising and tenderness at the site.  Follow instructions from your health care provider about how to take care of your radial site wound. Check the wound every day for signs of infection.  Do not lift anything that is heavier than 10 lb (4.5 kg), or the limit that you are told, until your health care provider says  that it is safe. This information is not intended to replace advice given to you by your health care provider. Make sure you discuss any questions you have with your health care provider. Document Revised: 12/10/2017 Document Reviewed: 12/10/2017 Elsevier Patient Education  2020 [employer]vier Inc.  

## 2020-04-07 NOTE — Interval H&P Note (Signed)
History and Physical Interval Note:  04/07/2020 12:01 PM  Allison White  has presented today for surgery, with the diagnosis of hf.  The various methods of treatment have been discussed with the patient and family. After consideration of risks, benefits and other options for treatment, the patient has consented to  Procedure(s): RIGHT/LEFT HEART CATH AND CORONARY ANGIOGRAPHY (N/A) and possible coronary angioplasty as a surgical intervention.  The patient's history has been reviewed, patient examined, no change in status, stable for surgery.  I have reviewed the patient's chart and labs.  Questions were answered to the patient's satisfaction.     [name]

## 2020-04-12 ENCOUNTER — Ambulatory Visit (INDEPENDENT_AMBULATORY_CARE_PROVIDER_SITE_OTHER): Payer: PPO | Admitting: Legal Medicine

## 2020-04-12 ENCOUNTER — Other Ambulatory Visit: Payer: Self-pay

## 2020-04-12 ENCOUNTER — Encounter: Payer: Self-pay | Admitting: Legal Medicine

## 2020-04-12 VITALS — BP 100/60 | HR 86 | Temp 97.5°F | Resp 16 | Ht 65.0 in | Wt 237.2 lb

## 2020-04-12 DIAGNOSIS — I773 Arterial fibromuscular dysplasia: Secondary | ICD-10-CM

## 2020-04-12 DIAGNOSIS — G894 Chronic pain syndrome: Secondary | ICD-10-CM | POA: Diagnosis not present

## 2020-04-12 DIAGNOSIS — G8929 Other chronic pain: Secondary | ICD-10-CM | POA: Insufficient documentation

## 2020-04-12 DIAGNOSIS — E7849 Other hyperlipidemia: Secondary | ICD-10-CM

## 2020-04-12 DIAGNOSIS — I5022 Chronic systolic (congestive) heart failure: Secondary | ICD-10-CM

## 2020-04-12 DIAGNOSIS — Z6839 Body mass index (BMI) 39.0-39.9, adult: Secondary | ICD-10-CM | POA: Diagnosis not present

## 2020-04-12 DIAGNOSIS — I110 Hypertensive heart disease with heart failure: Secondary | ICD-10-CM

## 2020-04-12 DIAGNOSIS — R931 Abnormal findings on diagnostic imaging of heart and coronary circulation: Secondary | ICD-10-CM

## 2020-04-12 DIAGNOSIS — Z950 Presence of cardiac pacemaker: Secondary | ICD-10-CM | POA: Diagnosis not present

## 2020-04-12 NOTE — Assessment & Plan Note (Signed)
Patient continues to have pain diffusely.

## 2020-04-12 NOTE — Assessment & Plan Note (Signed)
An individualize plan was formulated for obesity using patient history and physical exam to encourage weight loss.  An evidence based program was formulated.  Patient is to cutting portion size with meals and to plan physical exercise 3 days a week at least 20 minutes.  Weight watchers and other programs are helpful.  Planned amount of weight loss 10 lbs. She meets morbid obesity criteria with hypertension and cardiomyopathy.

## 2020-04-12 NOTE — Assessment & Plan Note (Signed)
An individualize plan was formulated for obesity using patient history and physical exam to encourage weight loss.  An evidence based program was formulated.  Patient is to cut portion size with meals and to plan physical exercise 3 days a week at least 20 minutes.  Weight watchers and other programs are helpful.  Planned amount of weight loss 10 lbs. 

## 2020-04-12 NOTE — Assessment & Plan Note (Signed)
An individualized care plan was established and reinforced.  The patient's disease status was assessed using clinical finding son exam today, labs, and/or other diagnostic testing such as x-rays, to determine the patient's success in meeting treatmentgoalsbased on disease-based guidelines and found to beimproving. But not at goal yet. Medications prescriptions no change Laboratory tests ordered to be performed today include routine. RECOMMENDATIONS: given include follow up with cardiology.  Call physician is patient gains 3 lbs in one day or 5 lbs for one week.  Call for progressive PND, orthopnea or increased pedal edema.

## 2020-04-12 NOTE — Progress Notes (Signed)
Established Patient Office Visit  Subjective:  Patient ID: Allison White, female    DOB: [birth year]  Age: 59 y.o. MRN: [id number]  CC:  Chief Complaint  Patient presents with  . Hypertension  . Hyperlipidemia    HPI Allison White presents for Chronic visit.  Patient presents with HFrEF  that is stable. Diagnosis made 2017.  The course of the disease is stable.  Current medicines include entresto, digoxin, metoprolol, farxiga. Patient follows a low cholesterol diet and maintains a weight diary.  Patient is on low salt, low cholesterol diet and avoids alcohol.  Patient denies adverse effects of medicines. Patient is monitoring weight and has increased weight of weight.  Patient is having some pedal edema, no PND and no PND.  Patient is continuing to see cardiology.last cath on 04/07/2020.  She is possible candidate for transplant.   Patient presents for follow up of hypertension.  Patient tolerating metoprolol, torsemide well with side effects.  Patient was diagnosed with hypertension metoprolol so has been treated for hypertension for 10 years.Patient is working on maintaining diet and exercise regimen and follows up as directed. Complication include cardiomyopathy.  Patient presents with hyperlipidemia.  Compliance with treatment has been good; patient takes medicines as directed, maintains low cholesterol diet, follows up as directed, and maintains exercise regimen.  Patient is using atorvastatin without problems.  She has chronic pain with her fibromuscular dysplasia.  She is on Dietitian.  She brought in her bottle 1/2 full of oxycodone. No history of abuse Past Medical History:  Diagnosis Date  . Headache(784.0)   . HTN (hypertension)   . Hyperlipidemia   . MI (myocardial infarction) ([place])   . Nonruptured cerebral aneurysm, internal carotid artery    Left side, stent placement (2009)  . Vertigo     Past Surgical History:  Procedure Laterality Date  . ABDOMINAL  HYSTERECTOMY    . CARDIAC DEFIBRILLATOR PLACEMENT    . CEREBRAL ANEURYSM REPAIR Left 2009  . CORONARY ANGIOPLASTY WITH STENT PLACEMENT    . PACEMAKER IMPLANT    . RIGHT/LEFT HEART CATH AND CORONARY ANGIOGRAPHY N/A 04/07/2020   Procedure: RIGHT/LEFT HEART CATH AND CORONARY ANGIOGRAPHY;  Surgeon: Jolaine Artist, MD;  Location: [facility];  Service: Cardiovascular;  Laterality: N/A;    Family History  Problem Relation Age of Onset  . Thyroid disease Mother   . Prostate cancer Father        Prostate  . Hypertension Brother   . Cerebral aneurysm Paternal Grandmother        Nonruptured    Social History   Socioeconomic History  . Marital status: Married    Spouse name: Not on file  . Number of children: Not on file  . Years of education: Not on file  . Highest education level: Not on file  Occupational History  . Not on file  Tobacco Use  . Smoking status: Former Smoker    Packs/day: 1.00    Years: 6.00    Pack years: 6.00    Types: Cigarettes  . Smokeless tobacco: Never Used  . Tobacco comment: Quit 30 years ago  Substance and Sexual Activity  . Alcohol use: Yes    Comment: Occasionally  . Drug use: No  . Sexual activity: Not on file  Other Topics Concern  . 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     Social Determinants of  Health   Financial Resource Strain: Low Risk   . Difficulty of Paying Living Expenses: Not hard at all  Food Insecurity: No Food Insecurity  . Worried About Charity fundraiser in the Last Year: Never true  . Ran Out of Food in the Last Year: Never true  Transportation Needs: No Transportation Needs  . Lack of Transportation (Medical): No  . Lack of Transportation (Non-Medical): No  Physical Activity: Sufficiently Active  . Days of Exercise per Week: 4 days  . Minutes of Exercise per Session: 90 min  Stress: No Stress Concern Present  . Feeling  of Stress : Not at all  Social Connections:   . Frequency of Communication with Friends and Family:   . Frequency of Social Gatherings with Friends and Family:   . Attends Religious Services:   . Active Member of Clubs or Organizations:   . Attends Archivist Meetings:   Allison White Marital Status:   Intimate Partner Violence:   . Fear of Current or Ex-Partner:   . Emotionally Abused:   Allison White Physically Abused:   . Sexually Abused:     Outpatient Medications Prior to Visit  Medication Sig Dispense Refill  . aspirin 81 MG EC tablet Take 81 mg by mouth daily.     Allison White atorvastatin (LIPITOR) 20 MG tablet Take 1 tablet by mouth once daily (Patient taking differently: Take 20 mg by mouth daily. ) 30 tablet 6  . dapagliflozin propanediol (FARXIGA) 10 MG TABS tablet Take 10 mg by mouth daily before breakfast. 30 tablet 11  . digoxin (LANOXIN) 0.125 MG tablet TAKE 1 TABLET BY MOUTH ONCE DAILY . APPOINTMENT REQUIRED FOR FUTURE REFILLS (Patient taking differently: Take 0.125 mg by mouth at bedtime. ) 30 tablet 6  . ENTRESTO 24-26 MG Take 1 tablet by mouth twice daily (Patient taking differently: Take 1 tablet by mouth 2 (two) times daily. ) 60 tablet 2  . HYDROcodone-acetaminophen (NORCO) 7.5-325 MG tablet Take 1 tablet by mouth every 6 (six) hours as needed for moderate pain. 90 tablet 0  . ivabradine (CORLANOR) 5 MG TABS tablet Take 1 tablet (5 mg total) by mouth 2 (two) times daily with a meal. 60 tablet 6  . loratadine (CLARITIN) 10 MG tablet Take 10 mg by mouth daily.    . metoprolol succinate (TOPROL-XL) 50 MG 24 hr tablet Take 1 tablet by mouth twice daily (Patient taking differently: Take 50 mg by mouth in the morning and at bedtime. ) 180 tablet 3  . Potassium Chloride ER 20 MEQ TBCR TAKE 1 TABLET BY MOUTH ONCE DAILY, NEEDS OFFICE VISIT FOR FURTHER REFILLS (Patient taking differently: Take 10 mEq by mouth daily at 12 noon. ) 30 tablet 0  . torsemide (DEMADEX) 100 MG tablet Take 0.5 tablets (50 mg  total) by mouth every morning. Can take extra 0.5 tab in PM AS NEEDED (Patient taking differently: Take 50 mg by mouth 2 (two) times daily. ) 30 tablet 6  . nitroGLYCERIN (NITROSTAT) 0.4 MG SL tablet Place 1 tablet (0.4 mg total) under the tongue every 5 (five) minutes as needed. (Patient taking differently: Place 0.4 mg under the tongue every 5 (five) minutes as needed for chest pain. ) 25 tablet 3  . spironolactone (ALDACTONE) 25 MG tablet Take 1 tablet (25 mg total) by mouth daily. 30 tablet 6   No facility-administered medications prior to visit.    Allergies  Allergen Reactions  . Tape Rash    PREFERS CLOTH OR NOTHING  .  Plavix [Clopidogrel Bisulfate] Other (See Comments)    Joint pain    ROS Review of Systems  Constitutional: Negative.   HENT: Negative.   Eyes: Negative.   Respiratory: Positive for shortness of breath.   Cardiovascular: Negative.   Gastrointestinal: Negative.   Endocrine: Negative.   Genitourinary: Negative.   Musculoskeletal: Negative.   Neurological: Negative.   Psychiatric/Behavioral: Negative.       Objective:    Physical Exam  Constitutional: She is oriented to person, place, and time. She appears well-developed and well-nourished.  HENT:  Head: Normocephalic and atraumatic.  Right Ear: External ear normal.  Left Ear: External ear normal.  Nose: Nose normal.  Mouth/Throat: Oropharynx is clear and moist.  Eyes: Pupils are equal, round, and reactive to light. Conjunctivae and EOM are normal.  Cardiovascular: Normal rate and regular rhythm. Exam reveals gallop (S4).  Edema arms and legs  Pulmonary/Chest: Effort normal and breath sounds normal.  Abdominal: Soft. Bowel sounds are normal.  Musculoskeletal:        General: Normal range of motion.     Cervical back: Normal range of motion.  Neurological: She is alert and oriented to person, place, and time. She has normal reflexes.  Skin: Skin is warm and dry.  Psychiatric: She has a normal mood  and affect. Her behavior is normal. Judgment and thought content normal.  Vitals reviewed.   BP 100/60   Pulse 86   Temp (!) 97.5 F (36.4 C)   Resp 16   Ht 5\' 5"  (1.651 m)   Wt 237 lb 3.2 oz (107.6 kg)   SpO2 96%   BMI 39.47 kg/m  Wt Readings from Last 3 Encounters:  04/12/20 237 lb 3.2 oz (107.6 kg)  04/07/20 235 lb (106.6 kg)  04/04/20 236 lb 12.8 oz (107.4 kg)     Health Maintenance Due  Topic Date Due  . Hepatitis C Screening  Never done  . HIV Screening  Never done  . TETANUS/TDAP  Never done  . PAP SMEAR-Modifier  Never done  . MAMMOGRAM  Never done  . COLONOSCOPY  Never done    There are no preventive care reminders to display for this patient.  No results found for: TSH Lab Results  Component Value Date   WBC 8.1 04/04/2020   HGB 14.3 04/07/2020   HCT 42.0 04/07/2020   MCV 95.1 04/04/2020   PLT 253 04/04/2020   Lab Results  Component Value Date   NA 141 04/07/2020   K 3.4 (L) 04/07/2020   CO2 26 04/04/2020   GLUCOSE 100 (H) 04/04/2020   BUN 19 04/04/2020   CREATININE 0.91 04/04/2020   BILITOT 0.7 02/03/2020   ALKPHOS 104 02/03/2020   AST 45 (H) 02/03/2020   ALT 75 (H) 02/03/2020   PROT 7.2 02/03/2020   ALBUMIN 3.9 02/03/2020   CALCIUM 9.4 04/04/2020   ANIONGAP 12 04/04/2020   GFR 71.82 07/22/2013   Lab Results  Component Value Date   CHOL 169 09/21/2018   Lab Results  Component Value Date   HDL 44 09/21/2018   Lab Results  Component Value Date   LDLCALC 80 09/21/2018   Lab Results  Component Value Date   TRIG 226 (H) 09/21/2018   Lab Results  Component Value Date   CHOLHDL 3.8 09/21/2018   No results found for: HGBA1C    Assessment & Plan:   Problem List Items Addressed This Visit      Cardiovascular and Mediastinum   Chronic systolic congestive  heart failure (HCC) (Chronic)    An individualized care plan was established and reinforced.  The patient's disease status was assessed using clinical finding son exam today,  labs, and/or other diagnostic testing such as x-rays, to determine the patient's success in meeting treatmentgoalsbased on disease-based guidelines and found to beimproving. But not at goal yet. Medications prescriptions no change Laboratory tests ordered to be performed today include routine. RECOMMENDATIONS: given include follow up with cardiology.  Call physician is patient gains 3 lbs in one day or 5 lbs for one week.  Call for progressive PND, orthopnea or increased pedal edema.      Fibromuscular dysplasia ([place])    Patient continues to have pain diffusely.      Hypertensive heart disease with heart failure ([place]) - Primary    An individual hypertension care plan was established and reinforced today.  The patient's status was assessed using clinical findings on exam and labs or diagnostic tests. The patient's success at meeting treatment goals on disease specific evidence-based guidelines and found to be well controlled. SELF MANAGEMENT: The patient and I together assessed ways to personally work towards obtaining the recommended goals. RECOMMENDATIONS: avoid decongestants found in common cold remedies, decrease consumption of alcohol, perform routine monitoring of BP with home BP cuff, exercise, reduction of dietary salt, take medicines as prescribed, try not to miss doses and quit smoking.  Regular exercise and maintaining a healthy weight is needed.  Stress reduction may help. A CLINICAL SUMMARY including written plan identify barriers to care unique to individual due to social or financial issues.  We attempt to mutually creat solutions for individual and family understanding.      Relevant Orders   CBC with Differential/Platelet   Comprehensive metabolic panel     Other   Morbid obesity ([place]) (Chronic)    An individualize plan was formulated for obesity using patient history and physical exam to encourage weight loss.  An evidence based program was formulated.  Patient is to cut  portion size with meals and to plan physical exercise 3 days a week at least 20 minutes.  Weight watchers and other programs are helpful.  Planned amount of weight loss 10 lbs.      Hyperlipidemia    AN INDIVIDUAL CARE PLAN for hyperlipidemia/ cholesterol was established and reinforced today.  The patient's status was assessed using clinical findings on exam, lab and other diagnostic tests. The patient's disease status was assessed based on evidence-based guidelines and found to be well controlled. MEDICATIONS were reviewed. SELF MANAGEMENT GOALS have been discussed and patient's success at attaining the goal of low cholesterol was assessed. RECOMMENDATION given include regular exercise 3 days a week and low cholesterol/low fat diet. CLINICAL SUMMARY including written plan to identify barriers unique to the patient due to social or economic  reasons was discussed.      Relevant Orders   Lipid panel   Decreased cardiac ejection fraction    Poor EF 20-25%      Chronic pain    AN INDIVIDUAL CARE PLAN was established and reinforced today.  The patient's status was assessed using clinical findings on exam, labs, and other diagnostic testing. Patient's success at meeting treatment goals based on disease specific evidence-bassed guidelines and found to be in fair control. RECOMMENDATIONS include maintining present medicines and treatment. He is on chronicoxycodone with no abuse.  Negative REMS.      Pacemaker    Present and controlling rate      BMI 39.0-39.9,adult  An individualize plan was formulated for obesity using patient history and physical exam to encourage weight loss.  An evidence based program was formulated.  Patient is to cutting portion size with meals and to plan physical exercise 3 days a week at least 20 minutes.  Weight watchers and other programs are helpful.  Planned amount of weight loss 10 lbs. She meets morbid obesity criteria with hypertension and cardiomyopathy.          No orders of the defined types were placed in this encounter.   Follow-up: Return in about 3 months (around 07/13/2020) for fasting.    Reinaldo Meeker, MD

## 2020-04-12 NOTE — Assessment & Plan Note (Signed)

## 2020-04-12 NOTE — Assessment & Plan Note (Signed)
Present and controlling rate

## 2020-04-12 NOTE — Assessment & Plan Note (Signed)

## 2020-04-12 NOTE — Assessment & Plan Note (Signed)
AN INDIVIDUAL CARE PLAN was established and reinforced today.  The patient's status was assessed using clinical findings on exam, labs, and other diagnostic testing. Patient's success at meeting treatment goals based on disease specific evidence-bassed guidelines and found to be in fair control. RECOMMENDATIONS include maintining present medicines and treatment. He is on chronicoxycodone with no abuse.  Negative REMS. 

## 2020-04-12 NOTE — Assessment & Plan Note (Signed)
Poor EF 20-25%

## 2020-04-13 LAB — LIPID PANEL
Chol/HDL Ratio: 4.2 ratio (ref 0.0–4.4)
Cholesterol, Total: 175 mg/dL (ref 100–199)
HDL: 42 mg/dL (ref >39)
LDL Chol Calc (NIH): 91 mg/dL (ref 0–99)
Triglycerides: 250 mg/dL — ABNORMAL HIGH (ref 0–149)
VLDL Cholesterol Cal: 42 mg/dL — ABNORMAL HIGH (ref 5–40)

## 2020-04-13 LAB — CBC WITH DIFFERENTIAL/PLATELET
Basophils Absolute: 0 10*3/uL (ref 0.0–0.2)
Basos: 1 %
EOS (ABSOLUTE): 0.2 10*3/uL (ref 0.0–0.4)
Eos: 3 %
Hematocrit: 46.2 % (ref 34.0–46.6)
Hemoglobin: 16.1 g/dL — ABNORMAL HIGH (ref 11.1–15.9)
Immature Grans (Abs): 0 10*3/uL (ref 0.0–0.1)
Immature Granulocytes: 0 %
Lymphocytes Absolute: 1.7 10*3/uL (ref 0.7–3.1)
Lymphs: 22 %
MCH: 31.8 pg (ref 26.6–33.0)
MCHC: 34.8 g/dL (ref 31.5–35.7)
MCV: 91 fL (ref 79–97)
Monocytes Absolute: 0.6 10*3/uL (ref 0.1–0.9)
Monocytes: 8 %
Neutrophils Absolute: 5.1 10*3/uL (ref 1.4–7.0)
Neutrophils: 66 %
Platelets: 250 10*3/uL (ref 150–450)
RBC: 5.06 x10E6/uL (ref 3.77–5.28)
RDW: 12.5 % (ref 11.7–15.4)
WBC: 7.7 10*3/uL (ref 3.4–10.8)

## 2020-04-13 LAB — COMPREHENSIVE METABOLIC PANEL
ALT: 109 IU/L — ABNORMAL HIGH (ref 0–32)
AST: 66 IU/L — ABNORMAL HIGH (ref 0–40)
Albumin/Globulin Ratio: 1.4 (ref 1.2–2.2)
Albumin: 4.4 g/dL (ref 3.8–4.9)
Alkaline Phosphatase: 133 IU/L — ABNORMAL HIGH (ref 48–121)
BUN/Creatinine Ratio: 23 (ref 9–23)
BUN: 23 mg/dL (ref 6–24)
Bilirubin Total: 0.6 mg/dL (ref 0.0–1.2)
CO2: 27 mmol/L (ref 20–29)
Calcium: 9.9 mg/dL (ref 8.7–10.2)
Chloride: 98 mmol/L (ref 96–106)
Creatinine, Ser: 0.99 mg/dL (ref 0.57–1.00)
GFR calc Af Amer: 73 mL/min/{1.73_m2} (ref >59)
GFR calc non Af Amer: 63 mL/min/{1.73_m2} (ref >59)
Globulin, Total: 3.1 g/dL (ref 1.5–4.5)
Glucose: 189 mg/dL — ABNORMAL HIGH (ref 65–99)
Potassium: 4.7 mmol/L (ref 3.5–5.2)
Sodium: 137 mmol/L (ref 134–144)
Total Protein: 7.5 g/dL (ref 6.0–8.5)

## 2020-04-13 LAB — CARDIOVASCULAR RISK ASSESSMENT

## 2020-04-13 NOTE — Progress Notes (Signed)
CBC normal, pO2 46.0, glucose 189 kidney tests ok, elevated liver test, triglycerides high- watch diet,  lp

## 2020-04-19 ENCOUNTER — Other Ambulatory Visit: Payer: Self-pay | Admitting: Legal Medicine

## 2020-04-26 ENCOUNTER — Telehealth (HOSPITAL_COMMUNITY): Payer: Self-pay | Admitting: Pharmacy Technician

## 2020-04-26 NOTE — Telephone Encounter (Signed)
Was able to get patient second PAN [name] for [place].  Patient Name Allison White DOB - 19-Mar-1961 Member ID - [id number] Disease Fund - Heart Failure 2nd grant amount - $1,000 Total remaining balance - $1,000 Eligibility End Date - 12/27/2020 Claims Submission End Date - 02/25/2021  All the billing information for the pharmacy remains the same.  Charlann Boxer, CPhT

## 2020-05-09 DIAGNOSIS — D485 Neoplasm of uncertain behavior of skin: Secondary | ICD-10-CM | POA: Diagnosis not present

## 2020-05-09 DIAGNOSIS — L918 Other hypertrophic disorders of the skin: Secondary | ICD-10-CM | POA: Diagnosis not present

## 2020-05-24 ENCOUNTER — Other Ambulatory Visit: Payer: Self-pay

## 2020-05-24 MED ORDER — HYDROCODONE-ACETAMINOPHEN 7.5-325 MG PO TABS: 1.0000 | ORAL_TABLET | Freq: Four times a day (QID) | ORAL | 0 refills | Status: DC | PRN

## 2020-06-12 ENCOUNTER — Other Ambulatory Visit: Payer: Self-pay | Admitting: Internal Medicine

## 2020-06-14 ENCOUNTER — Other Ambulatory Visit: Payer: Self-pay

## 2020-06-14 ENCOUNTER — Ambulatory Visit (HOSPITAL_COMMUNITY)
Admission: RE | Admit: 2020-06-14 | Discharge: 2020-06-14 | Disposition: A | Discharge status: Home / Self Care | Payer: PPO | Source: Ambulatory Visit | Attending: Internal Medicine | Admitting: Internal Medicine

## 2020-06-14 VITALS — BP 105/75 | HR 70 | Wt 235.4 lb

## 2020-06-14 DIAGNOSIS — Z95810 Presence of automatic (implantable) cardiac defibrillator: Secondary | ICD-10-CM | POA: Insufficient documentation

## 2020-06-14 DIAGNOSIS — Z888 Allergy status to other drugs, medicaments and biological substances status: Secondary | ICD-10-CM | POA: Diagnosis not present

## 2020-06-14 DIAGNOSIS — I2510 Atherosclerotic heart disease of native coronary artery without angina pectoris: Secondary | ICD-10-CM

## 2020-06-14 DIAGNOSIS — Z6839 Body mass index (BMI) 39.0-39.9, adult: Secondary | ICD-10-CM | POA: Insufficient documentation

## 2020-06-14 DIAGNOSIS — Z8349 Family history of other endocrine, nutritional and metabolic diseases: Secondary | ICD-10-CM | POA: Diagnosis not present

## 2020-06-14 DIAGNOSIS — Z79899 Other long term (current) drug therapy: Secondary | ICD-10-CM | POA: Insufficient documentation

## 2020-06-14 DIAGNOSIS — E785 Hyperlipidemia, unspecified: Secondary | ICD-10-CM | POA: Insufficient documentation

## 2020-06-14 DIAGNOSIS — R42 Dizziness and giddiness: Secondary | ICD-10-CM | POA: Insufficient documentation

## 2020-06-14 DIAGNOSIS — R002 Palpitations: Secondary | ICD-10-CM | POA: Diagnosis not present

## 2020-06-14 DIAGNOSIS — Z7984 Long term (current) use of oral hypoglycemic drugs: Secondary | ICD-10-CM | POA: Diagnosis not present

## 2020-06-14 DIAGNOSIS — I252 Old myocardial infarction: Secondary | ICD-10-CM | POA: Diagnosis not present

## 2020-06-14 DIAGNOSIS — Z955 Presence of coronary angioplasty implant and graft: Secondary | ICD-10-CM | POA: Insufficient documentation

## 2020-06-14 DIAGNOSIS — Z87891 Personal history of nicotine dependence: Secondary | ICD-10-CM | POA: Diagnosis not present

## 2020-06-14 DIAGNOSIS — Z8249 Family history of ischemic heart disease and other diseases of the circulatory system: Secondary | ICD-10-CM | POA: Insufficient documentation

## 2020-06-14 DIAGNOSIS — I110 Hypertensive heart disease with heart failure: Secondary | ICD-10-CM | POA: Insufficient documentation

## 2020-06-14 DIAGNOSIS — I5022 Chronic systolic (congestive) heart failure: Secondary | ICD-10-CM | POA: Diagnosis not present

## 2020-06-14 DIAGNOSIS — Z9119 Patient's noncompliance with other medical treatment and regimen: Secondary | ICD-10-CM | POA: Insufficient documentation

## 2020-06-14 DIAGNOSIS — Z7982 Long term (current) use of aspirin: Secondary | ICD-10-CM | POA: Diagnosis not present

## 2020-06-14 DIAGNOSIS — Z8679 Personal history of other diseases of the circulatory system: Secondary | ICD-10-CM | POA: Insufficient documentation

## 2020-06-14 LAB — DIGOXIN LEVEL: Digoxin Level: 0.5 ng/mL — ABNORMAL LOW (ref 0.8–2.0)

## 2020-06-14 LAB — BASIC METABOLIC PANEL
Anion gap: 12 (ref 5–15)
BUN: 17 mg/dL (ref 6–20)
CO2: 28 mmol/L (ref 22–32)
Calcium: 9.9 mg/dL (ref 8.9–10.3)
Chloride: 98 mmol/L (ref 98–111)
Creatinine, Ser: 0.89 mg/dL (ref 0.44–1.00)
GFR calc Af Amer: >60 mL/min (ref >60)
GFR calc non Af Amer: >60 mL/min (ref >60)
Glucose, Bld: 100 mg/dL — ABNORMAL HIGH (ref 70–99)
Potassium: 5.3 mmol/L — ABNORMAL HIGH (ref 3.5–5.1)
Sodium: 138 mmol/L (ref 135–145)

## 2020-06-14 LAB — BRAIN NATRIURETIC PEPTIDE: B Natriuretic Peptide: 98.4 pg/mL (ref 0.0–100.0)

## 2020-06-14 MED ORDER — IVABRADINE HCL 5 MG PO TABS: 7.5000 mg | ORAL_TABLET | Freq: Two times a day (BID) | ORAL | 6 refills | Status: DC

## 2020-06-14 NOTE — Addendum Note (Signed)
Encounter addended by: Marlise Eves, RN on: 06/14/2020 12:38 PM  Actions taken: Order list changed, Diagnosis association updated, Charge Capture section accepted, Clinical Note Signed

## 2020-06-14 NOTE — Patient Instructions (Signed)
Lab work done today. We will notify you of any abnormal lab work. No news is good news!  INCREASE Ivabradine 7.5mg  (1.5 tab) two times daily.  Your physician has recommended that you have a cardiopulmonary stress test (CPX). CPX testing is a non-invasive measurement of heart and lung function. It replaces a traditional treadmill stress test. This type of test provides a tremendous amount of information that relates not only to your present condition but also for future outcomes. This test combines measurements of you ventilation, respiratory gas exchange in the lungs, electrocardiogram (EKG), blood pressure and physical response before, during, and following an exercise protocol.   Please follow up with the [facility] in 4 months.  At the [facility], you and your health needs are our priority. As part of our continuing mission to provide you with exceptional heart care, we have created designated Provider Care Teams. These Care Teams include your primary Cardiologist (physician) and Advanced Practice Providers (APPs- Physician Assistants and Nurse Practitioners) who all work together to provide you with the care you need, when you need it.   You may see any of the following providers on your designated Care Team at your next follow up: [name] Dr Glori Bickers . Dr Loralie Champagne . Darrick Grinder, NP . Lyda Jester, PA . Audry Riles, PharmD   Please be sure to bring in all your medications bottles to every appointment.

## 2020-06-14 NOTE — Progress Notes (Signed)
[facility] Note  Date:  06/14/2020   ID:  Allison White, DOB [birth year], MRN [id number]  Location: Home  Provider location: [facility] Type of Visit: Established patient  PCP:  Lillard Anes, MD  Cardiologist:  No primary care provider on file. Primary HF: Adelyn Roscher  Chief Complaint: Heart Failure follow-up   History of Present Illness:  Allison White is a 59 y.o. female a h/o morbid obesity, HTN, carotid artery dissection with stenting of left carotid due to possible FMD, CAD s/p anterior MI 1/18 (DES to LAD), systolic HF with EF 67-89% s/p Biotronik ICD who is referred by Dr. Bettina Gavia for assistance with management of her HF.  She presents today for routine f/u. I had a televisit with her a few months ago at which time she seemed to be doing fairly well but reported having more swelling when in the sun or she gets hot. Suspect may be related to vasodilation or sun-sensitivity with meds. We increased torsemide from 50 bid to 100/50 for 1-2 days to see if that would help. Says it didn't really help much.  Felt terrible with Jardiance with dizziness, fatigue and orthostatic tachycardia. Now tolerating [place].  Echo 8/20  EF 20-25% RV ok.   Seen in 5/21 with worsening NYHA IIIb symptoms. Which showed normal coronary arteries with widely patent LAD stent, EF 20-25%, Normal hemodynamics with CI 3.4. CPX ordered  Here for HF f/u. Feels pretty good. Says much better than last time. Gets in her pool and does water exercises. Can do all ADLs without problem. SOB if she goes too fast or up hills. Mild edema when out in sun. No orthopnea or PND. 1-2x per month says HR will go up to 130 for the whole day. Says it hasn't done that since she started Corlanor.    Studies:  R/L cath 04/07/20 Ao = 102/56 (75) LV = 104/13 RA = 3 RV = 27/4 PA = 28/9 (17) PCW = 7 Fick cardiac output/index = 7.2/3.4 PVR = 1.4 WU FA sat = 99% PA sat =  79%, 81% High SVC sat = 81%  Monitor 10/19 1. Sinus rhythm 2. Rare PVCs and bigeminy (< 1.0%) 3. Two patient-triggered events associated with isolated PVCs.  4. No high-grade arrhythmias    Echo 06/2017 EF 20-25% Echo 02/2018 LVEF 25-30%, Trivial MR, Normal RV, Mild TR, PA peak pressure 26 mm Hg  CPX 03/17/18 FVC 3.00 (87%), FEV1 2.97(88%), FEV1/FVC 79 (99%)  Peak VO2: 13.8 - When adjusted to the patient's ideal body weight of 142.2 lb (64.5 kg) the peak VO2 is 22.6 ml/kg VE/VCO2 slope: 38 OUES: 1.52 Peak RER: 1.11 Ventilatory Threshold: 11.8 VE/MVV: 95% O2pulse: 8 Interpretation: Mild limitation due to HF and obesity   Allison White denies symptoms worrisome for COVID 19.   Past Medical History:  Diagnosis Date  . Headache(784.0)   . HTN (hypertension)   . Hyperlipidemia   . MI (myocardial infarction) ([place])   . Nonruptured cerebral aneurysm, internal carotid artery    Left side, stent placement (2009)  . Vertigo    Past Surgical History:  Procedure Laterality Date  . ABDOMINAL HYSTERECTOMY    . CARDIAC DEFIBRILLATOR PLACEMENT    . CEREBRAL ANEURYSM REPAIR Left 2009  . CORONARY ANGIOPLASTY WITH STENT PLACEMENT    . PACEMAKER IMPLANT    . RIGHT/LEFT HEART CATH AND CORONARY ANGIOGRAPHY N/A 04/07/2020   Procedure: RIGHT/LEFT HEART CATH AND CORONARY ANGIOGRAPHY;  Surgeon:  Aryel Edelen, Shaune Pascal, MD;  Location: [facility];  Service: Cardiovascular;  Laterality: N/A;     Current Outpatient Medications  Medication Sig Dispense Refill  . aspirin 81 MG EC tablet Take 81 mg by mouth daily.     [name] atorvastatin (LIPITOR) 20 MG tablet Take 20 mg by mouth daily.    . dapagliflozin propanediol (FARXIGA) 10 MG TABS tablet Take 10 mg by mouth daily before breakfast. 30 tablet 11  . digoxin (LANOXIN) 0.125 MG tablet TAKE 1 TABLET BY MOUTH ONCE DAILY. APPT REQUIRED FOR FUTURE REFILLS 30 tablet 2  . HYDROcodone-acetaminophen (NORCO) 7.5-325 MG tablet Take 1 tablet by mouth  every 6 (six) hours as needed for moderate pain. 90 tablet 0  . ivabradine (CORLANOR) 5 MG TABS tablet Take 1 tablet (5 mg total) by mouth 2 (two) times daily with a meal. 60 tablet 6  . metoprolol succinate (TOPROL-XL) 50 MG 24 hr tablet Take 50 mg by mouth 2 (two) times daily. Take with or immediately following a meal.    . nitroGLYCERIN (NITROSTAT) 0.4 MG SL tablet Place 0.4 mg under the tongue every 5 (five) minutes as needed for chest pain.    . potassium chloride SA (KLOR-CON) 20 MEQ tablet Take 20 mEq by mouth daily.    . sacubitril-valsartan (ENTRESTO) 24-26 MG Take 1 tablet by mouth 2 (two) times daily.    [name] spironolactone (ALDACTONE) 25 MG tablet Take 1 tablet by mouth once daily 30 tablet 6  . torsemide (DEMADEX) 100 MG tablet Take 50 mg by mouth 2 (two) times daily.     No current facility-administered medications for this encounter.    Allergies:   Tape and Plavix [clopidogrel bisulfate]   Social History:  The patient  reports that she has quit smoking. Her smoking use included cigarettes. She has a 6.00 pack-year smoking history. She has never used smokeless tobacco. She reports current alcohol use. She reports that she does not use drugs.   Family History:  The patient's family history includes Cerebral aneurysm in her paternal grandmother; Hypertension in her brother; Prostate cancer in her father; Thyroid disease in her mother.   ROS:  Please see the history of present illness.   All other systems are personally reviewed and negative.   Body mass index is 39.17 kg/m.  Vitals:   06/14/20 1152  BP: 105/75  Pulse: 70  SpO2: 95%  Weight: (!) 106.8 kg (235 lb 6.4 oz)    Exam:   General:  Well appearing. No resp difficulty HEENT: normal Neck: supple. no JVD. Carotids 2+ bilat; no bruits. No lymphadenopathy or thryomegaly appreciated. Cor: PMI nondisplaced. Regular rate & rhythm. No rubs, gallops or murmurs. Lungs: clear Abdomen: obese soft, nontender, nondistended. No  hepatosplenomegaly. No bruits or masses. Good bowel sounds. Extremities: no cyanosis, clubbing, rash, trace edema Neuro: alert & orientedx3, cranial nerves grossly intact. moves all 4 extremities w/o difficulty. Affect pleasant    Recent Labs: 10/20/2019: NT-Pro BNP 403 04/04/2020: B Natriuretic Peptide 85.7 04/12/2020: ALT 109; BUN 23; Creatinine, Ser 0.99; Hemoglobin 16.1; Platelets 250; Potassium 4.7; Sodium 137    Wt Readings from Last 3 Encounters:  06/14/20 (!) 106.8 kg (235 lb 6.4 oz)  04/12/20 107.6 kg (237 lb 3.2 oz)  04/07/20 106.6 kg (235 lb)      ASSESSMENT AND PLAN:  1. Chronic systolic HF, ICM - Echo 0/9735 LVEF 25-30%, Trivial MR, Normal RV, Mild TR, PA peak pressure 26 mm Hg - s/p Biotronik ICD -  Echo 10/21/19 EF 20-25% - Cath 5/21 EF 20-255 normal coronaries. Well compensated hemodynamics with CI 3.4 - Improved since previous NYHA II-III - Volume status looks good. Continue torsemide.  - Continue Entresto 24/26 bid (failed uptitration due to low BP with SBPs in 70s) - Continue spiro 25 mg daily.  - Continue toprol 50 mg BID - Continue digoxin 0.125 mg.  - Continue Farxiga - Increase ivabradine to 7.5 bid - tachycardia MUCH improved - CPX 4/19 with mild limitation from HF and obesity.   - Will repeat CPX testing - Check labs today   2. CAD s/p anterior MI 1/18 with DES to LAD in [place] - cath 5/21 normal cors with patent LAD stent  3. Obesity - Continue weight loss efforts  4. 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 No changes  6. Palpitations - Zio patch looked good - We discussed getting AliveCor device if these persist  - Referred for Sleep study but she cancelled due to cost. Husband said she doesn't snore. I worry OSA may be contributing to her fatigue. Can revisit as needed.    Signed, Glori Bickers, MD  06/14/2020 12:22 PM  [facility] [address] [facility] [place] [address] [phone] (office) [phone] (fax)

## 2020-06-15 ENCOUNTER — Ambulatory Visit (INDEPENDENT_AMBULATORY_CARE_PROVIDER_SITE_OTHER): Payer: PPO | Admitting: *Deleted

## 2020-06-15 ENCOUNTER — Telehealth (HOSPITAL_COMMUNITY): Payer: Self-pay | Admitting: *Deleted

## 2020-06-15 DIAGNOSIS — I429 Cardiomyopathy, unspecified: Secondary | ICD-10-CM

## 2020-06-15 DIAGNOSIS — I5022 Chronic systolic (congestive) heart failure: Secondary | ICD-10-CM

## 2020-06-15 LAB — CUP PACEART REMOTE DEVICE CHECK
Battery Remaining Percentage: 85 %
Battery Voltage: 3.11 V
Brady Statistic RA Percent Paced: 41 %
Brady Statistic RV Percent Paced: 0 %
Date Time Interrogation Session: 20210729084922
HighPow Impedance: 97 Ohm
Implantable Lead Implant Date: 20180410
Implantable Lead Implant Date: 20180410
Implantable Lead Location: 753859
Implantable Lead Location: 753860
Implantable Lead Model: 377
Implantable Lead Model: 402266
Implantable Lead Serial Number: 49794726
Implantable Lead Serial Number: 49838890
Implantable Pulse Generator Implant Date: 20180410
Lead Channel Impedance Value: 522 Ohm
Lead Channel Impedance Value: 594 Ohm
Lead Channel Pacing Threshold Amplitude: 0.4 V
Lead Channel Pacing Threshold Amplitude: 0.7 V
Lead Channel Pacing Threshold Pulse Width: 0.4 ms
Lead Channel Pacing Threshold Pulse Width: 0.4 ms
Lead Channel Sensing Intrinsic Amplitude: 17.6 mV
Lead Channel Sensing Intrinsic Amplitude: 3.6 mV
Lead Channel Setting Pacing Amplitude: 2 V
Lead Channel Setting Pacing Amplitude: 2.5 V
Lead Channel Setting Pacing Pulse Width: 0.4 ms
Lead Channel Setting Sensing Sensitivity: 0.8 mV
Pulse Gen Model: 404622
Pulse Gen Serial Number: 60982098

## 2020-06-15 NOTE — Telephone Encounter (Signed)
-----   Message from Jolaine Artist, MD sent at 06/15/2020 12:50 PM EDT ----- Stop potassium. Repeat BMET next week.

## 2020-06-15 NOTE — Telephone Encounter (Signed)
Pt aware, agreeable, and verbalized understanding, she request to have labs done in [place] office, order placed she will call them to sch lab appt.

## 2020-06-19 NOTE — Progress Notes (Signed)
Remote ICD transmission.   

## 2020-06-21 ENCOUNTER — Telehealth (HOSPITAL_COMMUNITY): Payer: Self-pay

## 2020-06-21 MED ORDER — IVABRADINE HCL 7.5 MG PO TABS: 7.5000 mg | ORAL_TABLET | Freq: Two times a day (BID) | ORAL | 11 refills | Status: DC

## 2020-06-21 NOTE — Telephone Encounter (Signed)
Received notification from Medicare that prior authorization for ivabradine is required due to increase in dose from 1 tablet (5 mg) BID to 1.5 tablets (7.5 mg) BID.  Sent new 7.5 mg script to pharmacy to avoid prior authorization.  Called pt to inform her of change. Pt verbalized understanding.   Vertis Kelch, PharmD, BCPS 06/21/2020       9:46 AM

## 2020-06-22 ENCOUNTER — Other Ambulatory Visit: Payer: Self-pay

## 2020-06-22 DIAGNOSIS — I5022 Chronic systolic (congestive) heart failure: Secondary | ICD-10-CM

## 2020-06-22 LAB — BASIC METABOLIC PANEL
BUN/Creatinine Ratio: 19 (ref 9–23)
BUN: 19 mg/dL (ref 6–24)
CO2: 28 mmol/L (ref 20–29)
Calcium: 10.1 mg/dL (ref 8.7–10.2)
Chloride: 96 mmol/L (ref 96–106)
Creatinine, Ser: 0.99 mg/dL (ref 0.57–1.00)
GFR calc Af Amer: 73 mL/min/{1.73_m2} (ref >59)
GFR calc non Af Amer: 63 mL/min/{1.73_m2} (ref >59)
Glucose: 135 mg/dL — ABNORMAL HIGH (ref 65–99)
Potassium: 4.8 mmol/L (ref 3.5–5.2)
Sodium: 138 mmol/L (ref 134–144)

## 2020-07-13 ENCOUNTER — Ambulatory Visit (HOSPITAL_COMMUNITY): Payer: PPO | Attending: Cardiology

## 2020-07-13 ENCOUNTER — Other Ambulatory Visit (HOSPITAL_COMMUNITY): Payer: Self-pay | Admitting: *Deleted

## 2020-07-13 ENCOUNTER — Other Ambulatory Visit: Payer: Self-pay

## 2020-07-13 DIAGNOSIS — I5022 Chronic systolic (congestive) heart failure: Secondary | ICD-10-CM

## 2020-07-17 ENCOUNTER — Encounter: Payer: Self-pay | Admitting: Legal Medicine

## 2020-07-17 ENCOUNTER — Other Ambulatory Visit: Payer: Self-pay

## 2020-07-17 ENCOUNTER — Ambulatory Visit (INDEPENDENT_AMBULATORY_CARE_PROVIDER_SITE_OTHER): Payer: PPO | Admitting: Legal Medicine

## 2020-07-17 VITALS — BP 106/60 | HR 62 | Temp 96.2°F | Resp 17 | Wt 237.0 lb

## 2020-07-17 DIAGNOSIS — I110 Hypertensive heart disease with heart failure: Secondary | ICD-10-CM

## 2020-07-17 DIAGNOSIS — I5022 Chronic systolic (congestive) heart failure: Secondary | ICD-10-CM | POA: Diagnosis not present

## 2020-07-17 DIAGNOSIS — I773 Arterial fibromuscular dysplasia: Secondary | ICD-10-CM

## 2020-07-17 DIAGNOSIS — G894 Chronic pain syndrome: Secondary | ICD-10-CM

## 2020-07-17 DIAGNOSIS — E7849 Other hyperlipidemia: Secondary | ICD-10-CM

## 2020-07-17 DIAGNOSIS — Z950 Presence of cardiac pacemaker: Secondary | ICD-10-CM

## 2020-07-17 NOTE — Progress Notes (Signed)
Subjective:  Patient ID: Allison White, female    DOB: [birth year]  Age: 59 y.o. MRN: [id number]  Chief Complaint  Patient presents with   Hyperlipidemia   Congestive Heart Failure    HPI: chronic visit  Patient presents for follow up of hypertension.  Patient tolerating valsartan,  well with side effects.  Patient was diagnosed with hypertension 2010 so has been treated for hypertension for 10 years.Patient is working on maintaining diet and exercise regimen and follows up as directed. Complication include none.  Patient presents with hyperlipidemia.  Compliance with treatment has been good; patient takes medicines as directed, maintains low cholesterol diet, follows up as directed, and maintains exercise regimen.  Patient is using atorvastatin without problems.  Patient presents with HFrEF  that is stable. Diagnosis made 2018.  The course of the disease is stable.  Current medicines include ivabradine, entresto, spironolactone, torsemide. Patient follows a low cholesterol diet and maintains a weight diary.  Patient is on low salt, low cholesterol diet and avoids alcohol.  Patient denies adverse effects of medicines. Patient is monitoring weight and has 2 lbs of weight.  Patient is having no pedal edema, no PND and no PND.  Patient is continuing to see cardiology.   Current Outpatient Medications on File Prior to Visit  Medication Sig Dispense Refill   aspirin 81 MG EC tablet Take 81 mg by mouth daily.      atorvastatin (LIPITOR) 20 MG tablet Take 20 mg by mouth daily.     dapagliflozin propanediol (FARXIGA) 10 MG TABS tablet Take 10 mg by mouth daily before breakfast. 30 tablet 11   digoxin (LANOXIN) 0.125 MG tablet TAKE 1 TABLET BY MOUTH ONCE DAILY. APPT REQUIRED FOR FUTURE REFILLS 30 tablet 2   HYDROcodone-acetaminophen (NORCO) 7.5-325 MG tablet Take 1 tablet by mouth every 6 (six) hours as needed for moderate pain. 90 tablet 0   ivabradine (CORLANOR) 7.5 MG TABS tablet Take 1  tablet (7.5 mg total) by mouth 2 (two) times daily with a meal. 60 tablet 11   metoprolol succinate (TOPROL-XL) 50 MG 24 hr tablet Take 50 mg by mouth 2 (two) times daily. Take with or immediately following a meal.     nitroGLYCERIN (NITROSTAT) 0.4 MG SL tablet Place 0.4 mg under the tongue every 5 (five) minutes as needed for chest pain.     sacubitril-valsartan (ENTRESTO) 24-26 MG Take 1 tablet by mouth 2 (two) times daily.     spironolactone (ALDACTONE) 25 MG tablet Take 1 tablet by mouth once daily 30 tablet 6   torsemide (DEMADEX) 100 MG tablet Take 50 mg by mouth daily.      No current facility-administered medications on file prior to visit.   Past Medical History:  Diagnosis Date   Headache(784.0)    HTN (hypertension)    Hyperlipidemia    MI (myocardial infarction) ([place])    Nonruptured cerebral aneurysm, internal carotid artery    Left side, stent placement (2009)   Vertigo    Past Surgical History:  Procedure Laterality Date   ABDOMINAL HYSTERECTOMY     CARDIAC DEFIBRILLATOR PLACEMENT     CEREBRAL ANEURYSM REPAIR Left 2009   CORONARY ANGIOPLASTY WITH STENT PLACEMENT     PACEMAKER IMPLANT     RIGHT/LEFT HEART CATH AND CORONARY ANGIOGRAPHY N/A 04/07/2020   Procedure: RIGHT/LEFT HEART CATH AND CORONARY ANGIOGRAPHY;  Surgeon: Jolaine Artist, MD;  Location: [facility];  Service: Cardiovascular;  Laterality: N/A;    Family History  Problem Relation Age of Onset   Thyroid disease Mother    Prostate cancer Father        Prostate   Hypertension Brother    Cerebral aneurysm Paternal Grandmother        Nonruptured   Social History   Socioeconomic History   Marital status: Married    Spouse name: Not on file   Number of children: Not on file   Years of education: Not on file   Highest education level: Not on file  Occupational History   Not on file  Tobacco Use   Smoking status: Former Smoker    Packs/day: 1.00    Years: 6.00     Pack years: 6.00    Types: Cigarettes   Smokeless tobacco: Never Used   Tobacco comment: Quit 30 years ago  Vaping Use   Vaping Use: Never used  Substance and Sexual Activity   Alcohol use: Yes    Comment: Occasionally   Drug use: No   Sexual activity: Not on file  Other Topics Concern   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     Social Determinants of Health   Financial Resource Strain: Low Risk    Difficulty of Paying Living Expenses: Not hard at all  Food Insecurity: No Food Insecurity   Worried About Charity fundraiser in the Last Year: Never true   [place] in the Last Year: Never true  Transportation Needs: No Transportation Needs   Lack of Transportation (Medical): No   Lack of Transportation (Non-Medical): No  Physical Activity: Sufficiently Active   Days of Exercise per Week: 4 days   Minutes of Exercise per Session: 90 min  Stress: No Stress Concern Present   Feeling of Stress : Not at all  Social Connections:    Frequency of Communication with Friends and Family: Not on file   Frequency of Social Gatherings with Friends and Family: Not on file   Attends Religious Services: Not on file   Active Member of Clubs or Organizations: Not on file   Attends Archivist Meetings: Not on file   Marital Status: Not on file    Review of Systems  Constitutional: Positive for fatigue.  HENT: Negative.   Eyes: Negative.   Respiratory: Positive for shortness of breath. Negative for cough, choking and chest tightness.   Cardiovascular: Positive for palpitations. Negative for chest pain and leg swelling.  Gastrointestinal: Negative.   Endocrine: Positive for polydipsia.  Genitourinary: Positive for dysuria and vaginal discharge.  Musculoskeletal: Positive for arthralgias and back pain.  Skin: Negative.   Neurological: Positive for headaches.    Psychiatric/Behavioral: Negative.    Depression screen [facility] 2/9 07/17/2020 07/17/2020 02/03/2020  Decreased Interest 0 0 0  Down, Depressed, Hopeless 0 0 0  PHQ - 2 Score 0 0 0  Altered sleeping 2 2 -  Tired, decreased energy 3 3 -  Change in appetite 3 3 -  Feeling bad or failure about yourself  - 0 -  Trouble concentrating 0 0 -  Moving slowly or fidgety/restless 0 0 -  Suicidal thoughts 0 0 -  PHQ-9 Score 8 8 -  Difficult doing work/chores Very difficult Very difficult -     Objective:  BP 106/60    Pulse 62    Temp (!) 96.2 F (35.7 C)    Resp 17  Wt 237 lb (107.5 kg)    BMI 39.44 kg/m   BP/Weight 07/17/2020 06/14/2020 05/15/3150  Systolic BP 761 607 371  Diastolic BP 60 75 60  Wt. (Lbs) 237 235.4 237.2  BMI 39.44 39.17 39.47    Physical Exam Vitals reviewed.  Constitutional:      Appearance: Normal appearance.  HENT:     Head: Normocephalic and atraumatic.     Right Ear: Tympanic membrane, ear canal and external ear normal.     Left Ear: Tympanic membrane, ear canal and external ear normal.     Nose: Nose normal.     Mouth/Throat:     Mouth: Mucous membranes are moist.  Eyes:     Extraocular Movements: Extraocular movements intact.     Conjunctiva/sclera: Conjunctivae normal.     Pupils: Pupils are equal, round, and reactive to light.  Cardiovascular:     Rate and Rhythm: Normal rate and regular rhythm.     Pulses: Normal pulses.     Heart sounds: Normal heart sounds.  Pulmonary:     Effort: Pulmonary effort is normal.     Breath sounds: Normal breath sounds.  Abdominal:     General: Abdomen is flat.     Palpations: Abdomen is soft.  Musculoskeletal:        General: Normal range of motion.     Cervical back: Normal range of motion and neck supple.  Skin:    General: Skin is warm and dry.     Capillary Refill: Capillary refill takes less than 2 seconds.  Neurological:     General: No focal deficit present.     Mental Status: She is alert and oriented to  person, place, and time.  Psychiatric:        Mood and Affect: Mood normal.        Thought Content: Thought content normal.      Lab Results  Component Value Date   WBC 7.7 04/12/2020   HGB 16.1 (H) 04/12/2020   HCT 46.2 04/12/2020   PLT 250 04/12/2020   GLUCOSE 135 (H) 06/22/2020   CHOL 175 04/12/2020   TRIG 250 (H) 04/12/2020   HDL 42 04/12/2020   LDLCALC 91 04/12/2020   ALT 109 (H) 04/12/2020   AST 66 (H) 04/12/2020   NA 138 06/22/2020   K 4.8 06/22/2020   CL 96 06/22/2020   CREATININE 0.99 06/22/2020   BUN 19 06/22/2020   CO2 28 06/22/2020   INR 1.0 04/04/2020      Assessment & Plan:   1. Hypertensive heart disease with heart failure (HCC) - Comprehensive metabolic panel - CBC with Differential/Platelet An individual hypertension care plan was established and reinforced today.  The patient's status was assessed using clinical findings on exam and labs or diagnostic tests. The patient's success at meeting treatment goals on disease specific evidence-based guidelines and found to be well controlled. SELF MANAGEMENT: The patient and I together assessed ways to personally work towards obtaining the recommended goals. RECOMMENDATIONS: avoid decongestants found in common cold remedies, decrease consumption of alcohol, perform routine monitoring of BP with home BP cuff, exercise, reduction of dietary salt, take medicines as prescribed, try not to miss doses and quit smoking.  Regular exercise and maintaining a healthy weight is needed.  Stress reduction may help. A CLINICAL SUMMARY including written plan identify barriers to care unique to individual due to social or financial issues.  We attempt to mutually creat solutions for individual and family understanding.  2. Other  hyperlipidemia - TSH - Lipid panel AN INDIVIDUAL CARE PLAN for hyperlipidemia/ cholesterol was established and reinforced today.  The patient's status was assessed using clinical findings on exam, lab and  other diagnostic tests. The patient's disease status was assessed based on evidence-based guidelines and found to be well controlled. MEDICATIONS were reviewed. SELF MANAGEMENT GOALS have been discussed and patient's success at attaining the goal of low cholesterol was assessed. RECOMMENDATION given include regular exercise 3 days a week and low cholesterol/low fat diet. CLINICAL SUMMARY including written plan to identify barriers unique to the patient due to social or economic  reasons was discussed.  3. Chronic systolic congestive heart failure [facility]) An individualized care plan was established and reinforced.  The patient's disease status was assessed using clinical finding son exam today, labs, and/or other diagnostic testing such as x-rays, to determine the patient's success in meeting treatmentgoalsbased on disease-based guidelines and found to beimproving. But not at goal yet. Medications prescriptions no changes Laboratory tests ordered to be performed today include non. RECOMMENDATIONS: given include see cardiology.  Call physician is patient gains 3 lbs in one day or 5 lbs for one week.  Call for progressive PND, orthopnea or increased pedal edema.  4. Chronic pain syndrome AN INDIVIDUAL CARE PLAN was established and reinforced today.  The patient's status was assessed using clinical findings on exam, labs, and other diagnostic testing. Patient's success at meeting treatment goals based on disease specific evidence-bassed guidelines and found to be in fair control. RECOMMENDATIONS include maintining present medicines and treatment. He is on chronichydrocodone with no abuse.  Negative REMS.  5. Fibromuscular dysplasia (HCC) AN INDIVIDUAL CARE PLAN fibromuscular dysplasia was established and reinforced today.  The patient's status was assessed using clinical findings on exam, labs, and other diagnostic testing. Patient's success at meeting treatment goals based on disease specific  evidence-bassed guidelines and found to be in fair control. RECOMMENDATIONS include maintaining present medicines and treatment.  6. Pacemaker Pacemaker present and working  7. Morbid obesity ([place]) An individualize plan was formulated for obesity using patient history and physical exam to encourage weight loss.  An evidence based program was formulated.  Patient is to cut portion size with meals and to plan physical exercise 3 days a week at least 20 minutes.  Weight watchers and other programs are helpful.  Planned amount of weight loss 10 lbs.      Orders Placed This Encounter  Procedures   Comprehensive metabolic panel   TSH   CBC with Differential/Platelet   Lipid panel     Follow-up: Return in about 6 months (around 01/15/2021) for chronic fasting.  An After Visit Summary was printed and given to the patient.  [place] [phone]

## 2020-07-18 LAB — CBC WITH DIFFERENTIAL/PLATELET
Basophils Absolute: 0.1 10*3/uL (ref 0.0–0.2)
Basos: 1 %
EOS (ABSOLUTE): 0.2 10*3/uL (ref 0.0–0.4)
Eos: 3 %
Hematocrit: 46.5 % (ref 34.0–46.6)
Hemoglobin: 16.1 g/dL — ABNORMAL HIGH (ref 11.1–15.9)
Immature Grans (Abs): 0 10*3/uL (ref 0.0–0.1)
Immature Granulocytes: 0 %
Lymphocytes Absolute: 2.1 10*3/uL (ref 0.7–3.1)
Lymphs: 24 %
MCH: 32 pg (ref 26.6–33.0)
MCHC: 34.6 g/dL (ref 31.5–35.7)
MCV: 92 fL (ref 79–97)
Monocytes Absolute: 0.9 10*3/uL (ref 0.1–0.9)
Monocytes: 10 %
Neutrophils Absolute: 5.6 10*3/uL (ref 1.4–7.0)
Neutrophils: 62 %
Platelets: 247 10*3/uL (ref 150–450)
RBC: 5.03 x10E6/uL (ref 3.77–5.28)
RDW: 13.1 % (ref 11.7–15.4)
WBC: 8.9 10*3/uL (ref 3.4–10.8)

## 2020-07-18 LAB — COMPREHENSIVE METABOLIC PANEL
ALT: 33 IU/L — ABNORMAL HIGH (ref 0–32)
AST: 28 IU/L (ref 0–40)
Albumin/Globulin Ratio: 1.7 (ref 1.2–2.2)
Albumin: 4.5 g/dL (ref 3.8–4.9)
Alkaline Phosphatase: 118 IU/L (ref 48–121)
BUN/Creatinine Ratio: 17 (ref 9–23)
BUN: 17 mg/dL (ref 6–24)
Bilirubin Total: 0.6 mg/dL (ref 0.0–1.2)
CO2: 25 mmol/L (ref 20–29)
Calcium: 9.9 mg/dL (ref 8.7–10.2)
Chloride: 99 mmol/L (ref 96–106)
Creatinine, Ser: 1 mg/dL (ref 0.57–1.00)
GFR calc Af Amer: 72 mL/min/{1.73_m2} (ref >59)
GFR calc non Af Amer: 62 mL/min/{1.73_m2} (ref >59)
Globulin, Total: 2.6 g/dL (ref 1.5–4.5)
Glucose: 106 mg/dL — ABNORMAL HIGH (ref 65–99)
Potassium: 3.8 mmol/L (ref 3.5–5.2)
Sodium: 139 mmol/L (ref 134–144)
Total Protein: 7.1 g/dL (ref 6.0–8.5)

## 2020-07-18 LAB — LIPID PANEL
Chol/HDL Ratio: 3.9 ratio (ref 0.0–4.4)
Cholesterol, Total: 175 mg/dL (ref 100–199)
HDL: 45 mg/dL (ref >39)
LDL Chol Calc (NIH): 90 mg/dL (ref 0–99)
Triglycerides: 239 mg/dL — ABNORMAL HIGH (ref 0–149)
VLDL Cholesterol Cal: 40 mg/dL (ref 5–40)

## 2020-07-18 LAB — TSH: TSH: 2.88 u[IU]/mL (ref 0.450–4.500)

## 2020-07-18 LAB — CARDIOVASCULAR RISK ASSESSMENT

## 2020-07-18 NOTE — Progress Notes (Signed)
Glucose 106, kidney tests normal, one liver tests slightly elevated, TSH 2.88, CBC normal, triglycerides high 239 lp

## 2020-07-24 ENCOUNTER — Other Ambulatory Visit (HOSPITAL_COMMUNITY): Payer: Self-pay | Admitting: Internal Medicine

## 2020-08-01 ENCOUNTER — Other Ambulatory Visit: Payer: Self-pay

## 2020-08-01 MED ORDER — ATORVASTATIN CALCIUM 20 MG PO TABS
20.0000 mg | ORAL_TABLET | Freq: Every day | ORAL | 3 refills | Status: DC
Start: 2020-08-01 — End: 2020-11-06

## 2020-08-15 ENCOUNTER — Other Ambulatory Visit: Payer: Self-pay | Admitting: Legal Medicine

## 2020-08-17 ENCOUNTER — Other Ambulatory Visit: Payer: Self-pay

## 2020-08-17 MED ORDER — HYDROCODONE-ACETAMINOPHEN 7.5-325 MG PO TABS
1.0000 | ORAL_TABLET | Freq: Four times a day (QID) | ORAL | 0 refills | Status: DC | PRN
Start: 2020-08-17 — End: 2020-10-26

## 2020-09-12 ENCOUNTER — Other Ambulatory Visit: Payer: Self-pay | Admitting: Internal Medicine

## 2020-09-14 ENCOUNTER — Ambulatory Visit (INDEPENDENT_AMBULATORY_CARE_PROVIDER_SITE_OTHER): Payer: PPO

## 2020-09-14 DIAGNOSIS — I5022 Chronic systolic (congestive) heart failure: Secondary | ICD-10-CM

## 2020-09-14 LAB — CUP PACEART REMOTE DEVICE CHECK
Battery Remaining Percentage: 82 %
Battery Voltage: 3.11 V
Brady Statistic RA Percent Paced: 19 %
Brady Statistic RV Percent Paced: 0 %
Date Time Interrogation Session: 20211028084720
HighPow Impedance: 95 Ohm
Implantable Lead Implant Date: 20180410
Implantable Lead Implant Date: 20180410
Implantable Lead Location: 753859
Implantable Lead Location: 753860
Implantable Lead Model: 377
Implantable Lead Model: 402266
Implantable Lead Serial Number: 49794726
Implantable Lead Serial Number: 49838890
Implantable Pulse Generator Implant Date: 20180410
Lead Channel Impedance Value: 507 Ohm
Lead Channel Impedance Value: 580 Ohm
Lead Channel Pacing Threshold Amplitude: 0.6 V
Lead Channel Pacing Threshold Amplitude: 0.6 V
Lead Channel Pacing Threshold Pulse Width: 0.4 ms
Lead Channel Pacing Threshold Pulse Width: 0.4 ms
Lead Channel Sensing Intrinsic Amplitude: 17.8 mV
Lead Channel Sensing Intrinsic Amplitude: 3.1 mV
Lead Channel Setting Pacing Amplitude: 2 V
Lead Channel Setting Pacing Amplitude: 2.5 V
Lead Channel Setting Pacing Pulse Width: 0.4 ms
Lead Channel Setting Sensing Sensitivity: 0.8 mV
Pulse Gen Model: 404622
Pulse Gen Serial Number: 60982098

## 2020-09-19 NOTE — Progress Notes (Signed)
Remote ICD transmission.   

## 2020-10-11 ENCOUNTER — Other Ambulatory Visit: Payer: Self-pay | Admitting: Internal Medicine

## 2020-10-14 NOTE — Progress Notes (Signed)
Advanced [facility] Failure Clinic Note  Date:  10/16/2020   ID:  Allison White, DOB 1961/08/03, MRN [id number]  Location: Home  Provider location: [facility] Type of Visit: Established patient  PCP:  Lillard Anes, MD  Cardiologist:  No primary care provider on file. Primary HF: Iyari Hagner  Chief Complaint: Heart Failure follow-up   History of Present Illness:  Allison White is a 59 y.o. female a h/o morbid obesity, HTN, carotid artery dissection with stenting of left carotid due to possible FMD, CAD s/p anterior MI 1/18 (DES to LAD), systolic HF with EF 17-79% s/p Biotronik ICD who is referred by Dr. Bettina Gavia for assistance with management of her HF.   Echo 8/20  EF 20-25% RV ok.   Seen in 5/21 with worsening NYHA IIIb symptoms. Had R/L cath with normal coronary arteries; widely patent LAD stent, EF 20-25%, Normal hemodynamics with CI 3.4. -> CPX  Here for HF f/u. Feels pretty good. Trying to be more active. Using stationary bike. And walking dogs several times per day. Takes her time but does ok. + SOB. No orthopnea or PND. Mild edema. CPX test as below     CPX 8/21 FVC 3.08 (91%)    FEV1 2.30 (87%%)     FEV1/FVC 75 (94%)     MVV 89 (94%)   RER 1.10 Peak VO2: 15.3 (90% predicted peak VO2) - adjusted to iBW 25.29ml/kg/min VE/VCO2 slope: 33    Studies:  R/L cath 04/07/20 Ao = 102/56 (75) LV = 104/13 RA = 3 RV = 27/4 PA = 28/9 (17) PCW = 7 Fick cardiac output/index = 7.2/3.4 PVR = 1.4 WU FA sat = 99% PA sat = 79%, 81% High SVC sat = 81%  Monitor 10/19 1. Sinus rhythm 2. Rare PVCs and bigeminy (< 1.0%) 3. Two patient-triggered events associated with isolated PVCs.  4. No high-grade arrhythmias    Echo 06/2017 EF 20-25% Echo 02/2018 LVEF 25-30%, Trivial MR, Normal RV, Mild TR, PA peak pressure 26 mm Hg  CPX 03/17/18 FVC 3.00 (87%), FEV1 2.97(88%), FEV1/FVC 79 (99%)  Peak VO2: 13.8 - When adjusted to the  patient's ideal body weight of 142.2 lb (64.5 kg) the peak VO2 is 22.6 ml/kg VE/VCO2 slope: 38 OUES: 1.52 Peak RER: 1.11 Ventilatory Threshold: 11.8 VE/MVV: 95% O2pulse: 8 Interpretation: Mild limitation due to HF and obesity   Past Medical History:  Diagnosis Date   CHF (congestive heart failure) (HCC)    Headache(784.0)    HTN (hypertension)    Hyperlipidemia    MI (myocardial infarction) ([facility])    Nonruptured cerebral aneurysm, internal carotid artery    Left side, stent placement (2009)   Vertigo    Past Surgical History:  Procedure Laterality Date   ABDOMINAL HYSTERECTOMY     CARDIAC DEFIBRILLATOR PLACEMENT     CEREBRAL ANEURYSM REPAIR Left 2009   CORONARY ANGIOPLASTY WITH STENT PLACEMENT     PACEMAKER IMPLANT     RIGHT/LEFT HEART CATH AND CORONARY ANGIOGRAPHY N/A 04/07/2020   Procedure: RIGHT/LEFT HEART CATH AND CORONARY ANGIOGRAPHY;  Surgeon: Jolaine Artist, MD;  Location: [facility];  Service: Cardiovascular;  Laterality: N/A;     Current Outpatient Medications  Medication Sig Dispense Refill   aspirin 81 MG EC tablet Take 81 mg by mouth daily.      atorvastatin (LIPITOR) 20 MG tablet Take 1 tablet (20 mg total) by mouth daily. 30 tablet 3   dapagliflozin propanediol (FARXIGA)  10 MG TABS tablet Take 10 mg by mouth daily before breakfast. 30 tablet 11   digoxin (LANOXIN) 0.125 MG tablet TAKE 1 TABLET BY MOUTH ONCE DAILY. APPT REQUIRED FOR FUTURE REFILLS 30 tablet 0   HYDROcodone-acetaminophen (NORCO) 7.5-325 MG tablet Take 1 tablet by mouth every 6 (six) hours as needed for moderate pain. 90 tablet 0   ivabradine (CORLANOR) 7.5 MG TABS tablet Take 1 tablet (7.5 mg total) by mouth 2 (two) times daily with a meal. 60 tablet 11   metoprolol succinate (TOPROL-XL) 50 MG 24 hr tablet Take 1 tablet by mouth twice daily 180 tablet 0   nitroGLYCERIN (NITROSTAT) 0.4 MG SL tablet Place 0.4 mg under the tongue every 5 (five) minutes as needed  for chest pain.     sacubitril-valsartan (ENTRESTO) 24-26 MG Take 1 tablet by mouth 2 (two) times daily.     spironolactone (ALDACTONE) 25 MG tablet Take 1 tablet by mouth once daily 30 tablet 6   torsemide (DEMADEX) 100 MG tablet Take 50 mg by mouth daily.      No current facility-administered medications for this encounter.    Allergies:   Tape and Plavix [clopidogrel bisulfate]   Social History:  The patient  reports that she has quit smoking. Her smoking use included cigarettes. She has a 6.00 pack-year smoking history. She has never used smokeless tobacco. She reports current alcohol use. She reports that she does not use drugs.   Family History:  The patient's family history includes Cerebral aneurysm in her paternal grandmother; Hypertension in her brother; Prostate cancer in her father; Thyroid disease in her mother.   ROS:  Please see the history of present illness.   All other systems are personally reviewed and negative.   Body mass index is 39.44 kg/m.  Vitals:   10/16/20 1107  BP: (!) 150/88  Pulse: 77  SpO2: 95%  Weight: 107.5 kg (237 lb)    Exam:   General:  Well appearing. No resp difficulty HEENT: normal Neck: supple. no JVD. Carotids 2+ bilat; no bruits. No lymphadenopathy or thryomegaly appreciated. Cor: PMI nondisplaced. Regular rate & rhythm. No rubs, gallops or murmurs. Lungs: clear Abdomen: obese soft, nontender, nondistended. No hepatosplenomegaly. No bruits or masses. Good bowel sounds. Extremities: no cyanosis, clubbing, rash,tr-1+ edema Neuro: alert & orientedx3, cranial nerves grossly intact. moves all 4 extremities w/o difficulty. Affect pleasant   Recent Labs: 10/20/2019: NT-Pro BNP 403 06/14/2020: B Natriuretic Peptide 98.4 07/17/2020: ALT 33; BUN 17; Creatinine, Ser 1.00; Hemoglobin 16.1; Platelets 247; Potassium 3.8; Sodium 139; TSH 2.880    Wt Readings from Last 3 Encounters:  10/16/20 107.5 kg (237 lb)  07/17/20 107.5 kg (237 lb)   06/14/20 (!) 106.8 kg (235 lb 6.4 oz)     ECG: NSR 80 LVH No ST-T wave abnormalities. Personally reviewed   ASSESSMENT AND PLAN:  1. Chronic systolic HF, ICM - Echo 11/7406 LVEF 25-30%, Trivial MR, Normal RV, Mild TR, PA peak pressure 26 mm Hg - s/p Biotronik ICD - Echo 07/13/19 EF 20-25% - Cath 5/21 EF 20-255 normal coronaries. Well compensated hemodynamics with CI 3.4 - Stable NYHA II-III. CPX 8/21 very reassuring - Peak VO2: 15.3 (90% predicted peak VO2) - adjusted to iBW 25.53ml/kg/min. VEVCO2 33  - Volume status slighly elevated. Continue torsemide. Can take extra as needed - Continue Entresto 24/26 bid (failed uptitration due to low BP with SBPs in 70s). She is reluctant to increase again today - Continue spiro 25 mg daily.  -  Continue toprol 50 mg BID - Continue digoxin 0.125 mg.  - Continue Farxiga 10 - Continue ivabradine 7.5 bid - Due for repeat echo   2. CAD s/p anterior MI 1/18 with DES to LAD in [place] - cath 5/21 normal cors with patent LAD stent - no s/s ischemia  3. Obesity - Continue weight loss efforts  4. 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 No changes  6. Palpitations - Zio patch 10/19 was ok  - We discussed getting AliveCor device if these persist  - Referred for Sleep study but she cancelled due to cost. Husband said she doesn't snore. I worry OSA may be contributing to her fatigue. Can revisit as needed.    Signed, Glori Bickers, MD  10/16/2020 11:35 AM  [facility] [place] [address] [phone] (office) [phone] (fax)

## 2020-10-16 ENCOUNTER — Other Ambulatory Visit: Payer: Self-pay

## 2020-10-16 ENCOUNTER — Encounter (HOSPITAL_COMMUNITY): Payer: Self-pay | Admitting: Internal Medicine

## 2020-10-16 ENCOUNTER — Ambulatory Visit (HOSPITAL_COMMUNITY)
Admission: RE | Admit: 2020-10-16 | Discharge: 2020-10-16 | Disposition: A | Discharge status: Home / Self Care | Payer: PPO | Source: Ambulatory Visit | Attending: Internal Medicine | Admitting: Internal Medicine

## 2020-10-16 ENCOUNTER — Other Ambulatory Visit: Payer: Self-pay | Admitting: Legal Medicine

## 2020-10-16 VITALS — BP 150/88 | HR 77 | Wt 237.0 lb

## 2020-10-16 DIAGNOSIS — Z955 Presence of coronary angioplasty implant and graft: Secondary | ICD-10-CM | POA: Diagnosis not present

## 2020-10-16 DIAGNOSIS — Z8249 Family history of ischemic heart disease and other diseases of the circulatory system: Secondary | ICD-10-CM | POA: Insufficient documentation

## 2020-10-16 DIAGNOSIS — I5022 Chronic systolic (congestive) heart failure: Secondary | ICD-10-CM | POA: Diagnosis not present

## 2020-10-16 DIAGNOSIS — Z888 Allergy status to other drugs, medicaments and biological substances status: Secondary | ICD-10-CM | POA: Diagnosis not present

## 2020-10-16 DIAGNOSIS — I110 Hypertensive heart disease with heart failure: Secondary | ICD-10-CM | POA: Diagnosis not present

## 2020-10-16 DIAGNOSIS — Z95810 Presence of automatic (implantable) cardiac defibrillator: Secondary | ICD-10-CM | POA: Diagnosis not present

## 2020-10-16 DIAGNOSIS — Z87891 Personal history of nicotine dependence: Secondary | ICD-10-CM | POA: Insufficient documentation

## 2020-10-16 DIAGNOSIS — I2510 Atherosclerotic heart disease of native coronary artery without angina pectoris: Secondary | ICD-10-CM | POA: Insufficient documentation

## 2020-10-16 DIAGNOSIS — Z7984 Long term (current) use of oral hypoglycemic drugs: Secondary | ICD-10-CM | POA: Insufficient documentation

## 2020-10-16 DIAGNOSIS — I7771 Dissection of carotid artery: Secondary | ICD-10-CM | POA: Insufficient documentation

## 2020-10-16 DIAGNOSIS — Z79899 Other long term (current) drug therapy: Secondary | ICD-10-CM | POA: Diagnosis not present

## 2020-10-16 DIAGNOSIS — R5383 Other fatigue: Secondary | ICD-10-CM | POA: Insufficient documentation

## 2020-10-16 DIAGNOSIS — R42 Dizziness and giddiness: Secondary | ICD-10-CM | POA: Insufficient documentation

## 2020-10-16 DIAGNOSIS — R002 Palpitations: Secondary | ICD-10-CM | POA: Diagnosis not present

## 2020-10-16 DIAGNOSIS — Z6839 Body mass index (BMI) 39.0-39.9, adult: Secondary | ICD-10-CM | POA: Diagnosis not present

## 2020-10-16 DIAGNOSIS — I252 Old myocardial infarction: Secondary | ICD-10-CM | POA: Insufficient documentation

## 2020-10-16 DIAGNOSIS — Z7982 Long term (current) use of aspirin: Secondary | ICD-10-CM | POA: Diagnosis not present

## 2020-10-16 DIAGNOSIS — I10 Essential (primary) hypertension: Secondary | ICD-10-CM

## 2020-10-16 DIAGNOSIS — Z8679 Personal history of other diseases of the circulatory system: Secondary | ICD-10-CM | POA: Insufficient documentation

## 2020-10-16 HISTORY — DX: Heart failure, unspecified: I50.9

## 2020-10-16 LAB — BASIC METABOLIC PANEL
Anion gap: 10 (ref 5–15)
BUN: 18 mg/dL (ref 6–20)
CO2: 29 mmol/L (ref 22–32)
Calcium: 10.1 mg/dL (ref 8.9–10.3)
Chloride: 97 mmol/L — ABNORMAL LOW (ref 98–111)
Creatinine, Ser: 0.93 mg/dL (ref 0.44–1.00)
GFR, Estimated: >60 mL/min (ref >60)
Glucose, Bld: 100 mg/dL — ABNORMAL HIGH (ref 70–99)
Potassium: 3.6 mmol/L (ref 3.5–5.1)
Sodium: 136 mmol/L (ref 135–145)

## 2020-10-16 LAB — CBC
HCT: 48.1 % — ABNORMAL HIGH (ref 36.0–46.0)
Hemoglobin: 16.5 g/dL — ABNORMAL HIGH (ref 12.0–15.0)
MCH: 31.7 pg (ref 26.0–34.0)
MCHC: 34.3 g/dL (ref 30.0–36.0)
MCV: 92.3 fL (ref 80.0–100.0)
Platelets: 260 10*3/uL (ref 150–400)
RBC: 5.21 MIL/uL — ABNORMAL HIGH (ref 3.87–5.11)
RDW: 12.6 % (ref 11.5–15.5)
WBC: 10.7 10*3/uL — ABNORMAL HIGH (ref 4.0–10.5)
nRBC: 0 % (ref 0.0–0.2)

## 2020-10-16 LAB — DIGOXIN LEVEL: Digoxin Level: 0.4 ng/mL — ABNORMAL LOW (ref 1.0–2.0)

## 2020-10-16 LAB — BRAIN NATRIURETIC PEPTIDE: B Natriuretic Peptide: 87.6 pg/mL (ref 0.0–100.0)

## 2020-10-16 NOTE — Patient Instructions (Signed)
Labs done today, your results will be available in MyChart, we will contact you for abnormal readings.  Your physician has requested that you have an echocardiogram. Echocardiography is a painless test that uses sound waves to create images of your heart. It provides your doctor with information about the size and shape of your heart and how well your heart's chambers and valves are working. This procedure takes approximately one hour. There are no restrictions for this procedure.  Your physician recommends that you schedule a follow-up appointment in: 4-6 months  If you have any questions or concerns before your next appointment please send [place] a message through [place] or call our office at [phone].    TO LEAVE A MESSAGE FOR THE NURSE SELECT OPTION 2, PLEASE LEAVE A MESSAGE INCLUDING: . YOUR NAME . DATE OF BIRTH . CALL BACK NUMBER . REASON FOR CALL**this is important as we prioritize the call backs  [place] AS LONG AS YOU CALL BEFORE 4:00 PM  At the [facility], you and your health needs are our priority. As part of our continuing mission to provide you with exceptional heart care, we have created designated Provider Care Teams. These Care Teams include your primary Cardiologist (physician) and Advanced Practice Providers (APPs- Physician Assistants and Nurse Practitioners) who all work together to provide you with the care you need, when you need it.   You may see any of the following providers on your designated Care Team at your next follow up: [name] Dr Glori Bickers . Dr Loralie Champagne . Darrick Grinder, NP . Lyda Jester, PA . Audry Riles, PharmD   Please be sure to bring in all your medications bottles to every appointment.

## 2020-10-16 NOTE — Addendum Note (Signed)
Encounter addended by: Scarlette Calico, RN on: 10/16/2020 11:51 AM  Actions taken: Order list changed, Diagnosis association updated, Clinical Note Signed, Charge Capture section accepted

## 2020-10-20 ENCOUNTER — Other Ambulatory Visit (HOSPITAL_COMMUNITY): Payer: Self-pay | Admitting: Internal Medicine

## 2020-10-20 ENCOUNTER — Other Ambulatory Visit: Payer: Self-pay

## 2020-10-20 MED ORDER — SPIRONOLACTONE 25 MG PO TABS
25.0000 mg | ORAL_TABLET | Freq: Every day | ORAL | 6 refills | Status: DC
Start: 2020-10-20 — End: 2021-05-14

## 2020-10-25 LAB — HEMOGLOBIN A1C: Hemoglobin A1C: 6.1

## 2020-10-25 LAB — HM DIABETES FOOT EXAM: HM Diabetic Foot Exam: NEGATIVE

## 2020-10-25 LAB — HM DIABETES EYE EXAM

## 2020-10-26 ENCOUNTER — Other Ambulatory Visit: Payer: Self-pay

## 2020-10-26 ENCOUNTER — Encounter: Payer: Self-pay | Admitting: Legal Medicine

## 2020-10-26 ENCOUNTER — Ambulatory Visit (INDEPENDENT_AMBULATORY_CARE_PROVIDER_SITE_OTHER): Payer: PPO | Admitting: Legal Medicine

## 2020-10-26 VITALS — BP 118/68 | HR 75 | Temp 95.5°F | Resp 17 | Ht 65.0 in | Wt 238.2 lb

## 2020-10-26 DIAGNOSIS — G894 Chronic pain syndrome: Secondary | ICD-10-CM | POA: Diagnosis not present

## 2020-10-26 DIAGNOSIS — G43011 Migraine without aura, intractable, with status migrainosus: Secondary | ICD-10-CM | POA: Diagnosis not present

## 2020-10-26 DIAGNOSIS — Z23 Encounter for immunization: Secondary | ICD-10-CM

## 2020-10-26 DIAGNOSIS — Z0000 Encounter for general adult medical examination without abnormal findings: Secondary | ICD-10-CM | POA: Insufficient documentation

## 2020-10-26 MED ORDER — HYDROCODONE-ACETAMINOPHEN 7.5-325 MG PO TABS: 1.0000 | ORAL_TABLET | Freq: Four times a day (QID) | ORAL | 0 refills | Status: DC | PRN

## 2020-10-26 NOTE — Progress Notes (Signed)
Subjective:  Patient ID: Allison White, female    DOB: 07/21/1961  Age: 59 y.o. MRN: [id number]  Chief Complaint  Patient presents with  . Annual Exam    AWV    HPI Encounter for general adult medical examination without abnormal findings  Physical ("At Risk" items are starred): Patient's last physical exam was 1 year ago .  Smoking: Life-long non-smoker ;  Physical Activity: Exercises at least 3 times per week ;  Alcohol/Drug Use: Is a non-drinker ; No illicit drug use ;  Patient is not afflicted from Stress Incontinence and Urge Incontinence  Safety: reviewed ; Patient wears a seat belt, has smoke detectors, has carbon monoxide detectors, practices appropriate gun safety, and wears sunscreen with extended sun exposure. Dental Care: biannual cleanings, brushes and flosses daily. Ophthalmology/Optometry: Annual visit.  Hearing loss: none Vision impairments: none    Needs mammogram  Fall Risk  10/26/2020 07/17/2020  Falls in the past year? 0 0  Number falls in past yr: 0 0  Injury with Fall? 0 0  Risk for fall due to : No Fall Risks -  Follow up - Falls evaluation completed     Depression screen [facility] 2/9 10/26/2020 07/17/2020 07/17/2020 02/03/2020  Decreased Interest 0 0 0 0  Down, Depressed, Hopeless 0 0 0 0  PHQ - 2 Score 0 0 0 0  Altered sleeping - 2 2 -  Tired, decreased energy - 3 3 -  Change in appetite - 3 3 -  Feeling bad or failure about yourself  - - 0 -  Trouble concentrating - 0 0 -  Moving slowly or fidgety/restless - 0 0 -  Suicidal thoughts - 0 0 -  PHQ-9 Score - 8 8 -  Difficult doing work/chores - Very difficult Very difficult -       Functional Status Survey: Is the patient deaf or have difficulty hearing?: No Does the patient have difficulty seeing, even when wearing glasses/contacts?: No Does the patient have difficulty concentrating, remembering, or making decisions?: No Does the patient have difficulty walking or climbing stairs?: Yes Does the  patient have difficulty dressing or bathing?: No Does the patient have difficulty doing errands alone such as visiting a doctor's office or shopping?: No   Social Hx   Social History   Socioeconomic History  . Marital status: Married    Spouse name: Not on file  . Number of children: Not on file  . Years of education: Not on file  . Highest education level: Not on file  Occupational History  . Not on file  Tobacco Use  . Smoking status: Former Smoker    Packs/day: 1.00    Years: 6.00    Pack years: 6.00    Types: Cigarettes  . Smokeless tobacco: Never Used  . Tobacco comment: Quit 30 years ago  Vaping Use  . Vaping Use: Never used  Substance and Sexual Activity  . Alcohol use: Yes    Comment: Occasionally  . Drug use: No  . Sexual activity: Not Currently  Other Topics Concern  . 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     Social Determinants of Health   Financial Resource Strain: Low Risk   . Difficulty of Paying Living Expenses: Not hard at all  Food Insecurity: No Food Insecurity  . Worried About Charity fundraiser in the Last Year: Never true  .  Ran Out of Food in the Last Year: Never true  Transportation Needs: No Transportation Needs  . Lack of Transportation (Medical): No  . Lack of Transportation (Non-Medical): No  Physical Activity: Sufficiently Active  . Days of Exercise per Week: 4 days  . Minutes of Exercise per Session: 90 min  Stress: No Stress Concern Present  . Feeling of Stress : Not at all  Social Connections: Moderately Integrated  . Frequency of Communication with Friends and Family: More than three times a week  . Frequency of Social Gatherings with Friends and Family: Twice a week  . Attends Religious Services: 1 to 4 times per year  . Active Member of Clubs or Organizations: No  . Attends Archivist Meetings: Never  . Marital Status: Married    Past Medical History:  Diagnosis Date  . CHF (congestive heart failure) ([facility])   . Headache(784.0)   . HTN (hypertension)   . Hyperlipidemia   . MI (myocardial infarction) ([facility])   . Nonruptured cerebral aneurysm, internal carotid artery    Left side, stent placement (2009)  . Vertigo    Family History  Problem Relation Age of Onset  . Thyroid disease Mother   . Prostate cancer Father        Prostate  . Hypertension Brother   . Cerebral aneurysm Paternal Grandmother        Nonruptured    Review of Systems  Constitutional: Negative for activity change, appetite change and chills.  HENT: Negative for congestion, ear pain and sinus pain.   Eyes: Negative for visual disturbance.  Respiratory: Negative for cough, shortness of breath and wheezing.   Cardiovascular: Negative for chest pain, palpitations and leg swelling.  Gastrointestinal: Negative for abdominal distention, abdominal pain and diarrhea.  Endocrine: Negative for polyphagia and polyuria.  Genitourinary: Negative for difficulty urinating and frequency.  Musculoskeletal: Negative for arthralgias, back pain and neck pain.  Skin: Negative.   Neurological: Negative for speech difficulty, numbness and headaches.  Psychiatric/Behavioral: Negative for agitation and behavioral problems.     Objective:  BP 118/68 (BP Location: Left Arm, Patient Position: Sitting, Cuff Size: Normal)   Pulse 75   Temp (!) 95.5 F (35.3 C) (Temporal)   Resp 17   Ht 5\' 5"  (1.651 m)   Wt 238 lb 3.2 oz (108 kg)   SpO2 97%   BMI 39.64 kg/m   BP/Weight 10/26/2020 10/16/2020 0/01/7047  Systolic BP 889 169 450  Diastolic BP 68 88 60  Wt. (Lbs) 238.2 237 237  BMI 39.64 39.44 39.44    Physical Exam na Lab Results  Component Value Date   WBC 10.7 (H) 10/16/2020   HGB 16.5 (H) 10/16/2020   HCT 48.1 (H) 10/16/2020   PLT 260 10/16/2020   GLUCOSE 100 (H) 10/16/2020   CHOL 175 07/17/2020   TRIG 239 (H) 07/17/2020   HDL 45 07/17/2020    LDLCALC 90 07/17/2020   ALT 33 (H) 07/17/2020   AST 28 07/17/2020   NA 136 10/16/2020   K 3.6 10/16/2020   CL 97 (L) 10/16/2020   CREATININE 0.93 10/16/2020   BUN 18 10/16/2020   CO2 29 10/16/2020   TSH 2.880 07/17/2020   INR 1.0 04/04/2020      Assessment & Plan:  1. Chronic pain syndrome - HYDROcodone-acetaminophen (NORCO) 7.5-325 MG tablet; Take 1 tablet by mouth every 6 (six) hours as needed for moderate pain.  Dispense: 90 tablet; Refill: 0 Patient has chronic back pain and is being  treated with hydrocodone as needed.  Early a.m. as is negative and she has no abuse.  We will need to get a pain contract resigned at next visit.  2. Intractable migraine without aura and with status migrainosus - Ambulatory referral to Neurology Patient has worsening migraines and is hurting almost daily.  We discussed referral to neurology in [facility] for work-up of her headaches and her migraines at do not seem to be responding.  3. Routine general medical examination at a health care facility Patient had full annual wellness visit and immunizations were brought up-to-date.  She was given a living will form.  Treat instructions were given to her also.  4. Encounter for immunization - Flu Vaccine MDCK QUAD PF    Meds ordered this encounter  Medications  . HYDROcodone-acetaminophen (NORCO) 7.5-325 MG tablet    Sig: Take 1 tablet by mouth every 6 (six) hours as needed for moderate pain.    Dispense:  90 tablet    Refill:  0   Needs living will These are the goals we discussed: Goals    . DIET - REDUCE PORTION SIZE        This is a list of the screening recommended for you and due dates:  Health Maintenance  Topic Date Due  .  Hepatitis C: One time screening is recommended by Center for Disease Control  (CDC) for  adults born from [birth year] through 1964.   Never done  . HIV Screening  Never done  . Tetanus Vaccine  Never done  . Pap Smear  Never done  . Mammogram  Never done  . Colon  Cancer Screening  Never done  . COVID-19 Vaccine (3 - Booster for Pfizer series) 09/20/2020  . Flu Shot  Completed     AN INDIVIDUALIZED CARE PLAN: was established or reinforced today.   SELF MANAGEMENT: The patient and I together assessed ways to personally work towards obtaining the recommended goals  Support needs The patient and/or family needs were assessed and services were offered and not necessary at this time.    Follow-up: Return in about 3 months (around 01/24/2021).  Reinaldo Meeker, MD [facility] [phone]

## 2020-10-26 NOTE — Patient Instructions (Signed)

## 2020-10-30 ENCOUNTER — Ambulatory Visit (HOSPITAL_COMMUNITY)
Admission: RE | Admit: 2020-10-30 | Discharge: 2020-10-30 | Disposition: A | Discharge status: Home / Self Care | Payer: PPO | Source: Ambulatory Visit | Attending: Internal Medicine | Admitting: Internal Medicine

## 2020-10-30 ENCOUNTER — Other Ambulatory Visit: Payer: Self-pay

## 2020-10-30 DIAGNOSIS — Z950 Presence of cardiac pacemaker: Secondary | ICD-10-CM | POA: Diagnosis not present

## 2020-10-30 DIAGNOSIS — I5022 Chronic systolic (congestive) heart failure: Secondary | ICD-10-CM

## 2020-10-30 DIAGNOSIS — Z87891 Personal history of nicotine dependence: Secondary | ICD-10-CM | POA: Insufficient documentation

## 2020-10-30 DIAGNOSIS — E785 Hyperlipidemia, unspecified: Secondary | ICD-10-CM | POA: Diagnosis not present

## 2020-10-30 DIAGNOSIS — I252 Old myocardial infarction: Secondary | ICD-10-CM | POA: Insufficient documentation

## 2020-10-30 DIAGNOSIS — I429 Cardiomyopathy, unspecified: Secondary | ICD-10-CM | POA: Insufficient documentation

## 2020-10-30 DIAGNOSIS — I110 Hypertensive heart disease with heart failure: Secondary | ICD-10-CM | POA: Insufficient documentation

## 2020-10-30 DIAGNOSIS — G894 Chronic pain syndrome: Secondary | ICD-10-CM

## 2020-10-30 LAB — ECHOCARDIOGRAM COMPLETE
Area-P 1/2: 5.13 cm2
Calc EF: 27.3 %
MV M vel: 4.06 m/s
MV Peak grad: 65.9 mmHg
S' Lateral: 4.5 cm
Single Plane A2C EF: 28.1 %
Single Plane A4C EF: 27.4 %

## 2020-10-30 MED ORDER — HYDROCODONE-ACETAMINOPHEN 7.5-325 MG PO TABS: 1.0000 | ORAL_TABLET | Freq: Four times a day (QID) | ORAL | 0 refills | Status: DC | PRN

## 2020-10-30 NOTE — Progress Notes (Signed)
°  Echocardiogram 2D Echocardiogram has been performed.  Allison White 10/30/2020, 11:44 AM

## 2020-11-06 ENCOUNTER — Other Ambulatory Visit: Payer: Self-pay

## 2020-11-06 MED ORDER — ATORVASTATIN CALCIUM 20 MG PO TABS
20.0000 mg | ORAL_TABLET | Freq: Every day | ORAL | 1 refills | Status: DC
Start: 2020-11-06 — End: 2021-04-29

## 2020-11-10 ENCOUNTER — Other Ambulatory Visit: Payer: Self-pay | Admitting: Internal Medicine

## 2020-11-13 ENCOUNTER — Encounter: Payer: Self-pay | Admitting: Legal Medicine

## 2020-11-14 ENCOUNTER — Other Ambulatory Visit: Payer: Self-pay | Admitting: Internal Medicine

## 2020-11-15 ENCOUNTER — Telehealth: Payer: Self-pay

## 2020-11-16 ENCOUNTER — Other Ambulatory Visit: Payer: Self-pay | Admitting: Internal Medicine

## 2020-11-16 NOTE — Telephone Encounter (Signed)
Her med list is correct  50mg  daily

## 2020-11-18 DIAGNOSIS — C801 Malignant (primary) neoplasm, unspecified: Secondary | ICD-10-CM

## 2020-11-18 HISTORY — DX: Malignant (primary) neoplasm, unspecified: C80.1

## 2020-11-20 ENCOUNTER — Other Ambulatory Visit (HOSPITAL_COMMUNITY): Payer: Self-pay | Admitting: *Deleted

## 2020-11-20 MED ORDER — TORSEMIDE 100 MG PO TABS
50.0000 mg | ORAL_TABLET | Freq: Every day | ORAL | 3 refills | Status: DC
Start: 2020-11-20 — End: 2021-02-05

## 2020-11-21 ENCOUNTER — Ambulatory Visit: Payer: PPO | Admitting: Legal Medicine

## 2020-12-14 ENCOUNTER — Ambulatory Visit (INDEPENDENT_AMBULATORY_CARE_PROVIDER_SITE_OTHER): Payer: Self-pay

## 2020-12-14 DIAGNOSIS — I429 Cardiomyopathy, unspecified: Secondary | ICD-10-CM

## 2020-12-14 LAB — CUP PACEART REMOTE DEVICE CHECK
Battery Remaining Percentage: 80 %
Battery Voltage: 3.11 V
Date Time Interrogation Session: 20220127075220
HighPow Impedance: 94 Ohm
Implantable Lead Implant Date: 20180410
Implantable Lead Implant Date: 20180410
Implantable Lead Location: 753859
Implantable Lead Location: 753860
Implantable Lead Model: 377
Implantable Lead Model: 402266
Implantable Lead Serial Number: 49794726
Implantable Lead Serial Number: 49838890
Implantable Pulse Generator Implant Date: 20180410
Lead Channel Impedance Value: 522 Ohm
Lead Channel Impedance Value: 594 Ohm
Lead Channel Pacing Threshold Amplitude: 0.6 V
Lead Channel Pacing Threshold Amplitude: 0.7 V
Lead Channel Pacing Threshold Pulse Width: 0.4 ms
Lead Channel Pacing Threshold Pulse Width: 0.4 ms
Lead Channel Sensing Intrinsic Amplitude: 16.6 mV
Lead Channel Sensing Intrinsic Amplitude: 3.6 mV
Lead Channel Setting Pacing Amplitude: 2 V
Lead Channel Setting Pacing Amplitude: 2.5 V
Lead Channel Setting Pacing Pulse Width: 0.4 ms
Lead Channel Setting Sensing Sensitivity: 0.8 mV
Pulse Gen Model: 404622
Pulse Gen Serial Number: 60982098

## 2020-12-18 ENCOUNTER — Other Ambulatory Visit (HOSPITAL_COMMUNITY): Payer: Self-pay | Admitting: Internal Medicine

## 2020-12-25 NOTE — Progress Notes (Signed)
Remote ICD transmission.   

## 2020-12-26 ENCOUNTER — Other Ambulatory Visit (HOSPITAL_COMMUNITY): Payer: Self-pay

## 2020-12-26 MED ORDER — DAPAGLIFLOZIN PROPANEDIOL 10 MG PO TABS
10.0000 mg | ORAL_TABLET | Freq: Every day | ORAL | 2 refills | Status: DC
Start: 2020-12-26 — End: 2021-12-26

## 2021-01-01 ENCOUNTER — Other Ambulatory Visit: Payer: Self-pay

## 2021-01-01 ENCOUNTER — Encounter: Payer: Self-pay | Admitting: Legal Medicine

## 2021-01-01 ENCOUNTER — Ambulatory Visit (INDEPENDENT_AMBULATORY_CARE_PROVIDER_SITE_OTHER): Payer: HMO | Admitting: Legal Medicine

## 2021-01-01 VITALS — BP 102/80 | HR 76 | Temp 97.4°F | Resp 16 | Ht 65.0 in | Wt 243.0 lb

## 2021-01-01 DIAGNOSIS — Z6841 Body Mass Index (BMI) 40.0 and over, adult: Secondary | ICD-10-CM

## 2021-01-01 DIAGNOSIS — I110 Hypertensive heart disease with heart failure: Secondary | ICD-10-CM | POA: Diagnosis not present

## 2021-01-01 DIAGNOSIS — E7849 Other hyperlipidemia: Secondary | ICD-10-CM | POA: Diagnosis not present

## 2021-01-01 DIAGNOSIS — I773 Arterial fibromuscular dysplasia: Secondary | ICD-10-CM

## 2021-01-01 DIAGNOSIS — I5022 Chronic systolic (congestive) heart failure: Secondary | ICD-10-CM | POA: Diagnosis not present

## 2021-01-01 DIAGNOSIS — G894 Chronic pain syndrome: Secondary | ICD-10-CM

## 2021-01-01 NOTE — Progress Notes (Signed)
Subjective:  Patient ID: Allison White, female    DOB: [birth year]  Age: 60 y.o. MRN: [id number]  Chief Complaint  Patient presents with  . Hyperlipidemia  . Congestive Heart Failure    HPI: chronic visit  Patient presents with hyperlipidemia.  Compliance with treatment has been good; patient takes medicines as directed, maintains low cholesterol diet, follows up as directed, and maintains exercise regimen.  Patient is using atorvastatin without problems.  Patient presents with HFrEF  that is stable. Diagnosis made 2020.  The course of the disease is stable.  Current medicines iEntresto and metoprololnclude . Patient follows a low cholesterol diet and maintains a weight diary.  Patient is on low salt, low cholesterol diet and avoids alcohol.  Patient denies adverse effects of medicines. Patient is monitoring weight and has no change of weight.  Patient is having some pedal edema, no PND and no PND.  Patient is continuing to see cardiology.   Current Outpatient Medications on File Prior to Visit  Medication Sig Dispense Refill  . aspirin 81 MG EC tablet Take 81 mg by mouth daily.     [name] atorvastatin (LIPITOR) 20 MG tablet Take 1 tablet (20 mg total) by mouth daily. 90 tablet 1  . dapagliflozin propanediol (FARXIGA) 10 MG TABS tablet Take 1 tablet (10 mg total) by mouth daily. 30 tablet 2  . digoxin (LANOXIN) 0.125 MG tablet TAKE 1 TABLET BY MOUTH ONCE DAILY. APPT REQUIRED FOR FUTURE REFILLS 30 tablet 0  . ENTRESTO 24-26 MG Take 1 tablet by mouth twice daily 60 tablet 6  . HYDROcodone-acetaminophen (NORCO) 7.5-325 MG tablet Take 1 tablet by mouth every 6 (six) hours as needed for moderate pain. 90 tablet 0  . ivabradine (CORLANOR) 7.5 MG TABS tablet Take 1 tablet (7.5 mg total) by mouth 2 (two) times daily with a meal. 60 tablet 11  . metoprolol succinate (TOPROL-XL) 50 MG 24 hr tablet Take 1 tablet by mouth twice daily 180 tablet 0  . nitroGLYCERIN (NITROSTAT) 0.4 MG SL tablet Place 0.4 mg  under the tongue every 5 (five) minutes as needed for chest pain.    [name] spironolactone (ALDACTONE) 25 MG tablet Take 1 tablet (25 mg total) by mouth daily. 30 tablet 6  . torsemide (DEMADEX) 100 MG tablet Take 0.5 tablets (50 mg total) by mouth daily. 45 tablet 3   No current facility-administered medications on file prior to visit.   Past Medical History:  Diagnosis Date  . Headache(784.0)   . Hyperlipidemia   . MI (myocardial infarction) ([place])   . Nonruptured cerebral aneurysm, internal carotid artery    Left side, stent placement (2009)  . Vertigo    Past Surgical History:  Procedure Laterality Date  . ABDOMINAL HYSTERECTOMY    . CARDIAC DEFIBRILLATOR PLACEMENT    . CEREBRAL ANEURYSM REPAIR Left 2009  . CORONARY ANGIOPLASTY WITH STENT PLACEMENT    . PACEMAKER IMPLANT    . RIGHT/LEFT HEART CATH AND CORONARY ANGIOGRAPHY N/A 04/07/2020   Procedure: RIGHT/LEFT HEART CATH AND CORONARY ANGIOGRAPHY;  Surgeon: Jolaine Artist, MD;  Location: [facility];  Service: Cardiovascular;  Laterality: N/A;    Family History  Problem Relation Age of Onset  . Thyroid disease Mother   . Prostate cancer Father        Prostate  . Hypertension Brother   . Cerebral aneurysm Paternal Grandmother        Nonruptured   Social History   Socioeconomic History  . Marital status:  Married    Spouse name: Not on file  . Number of children: Not on file  . Years of education: Not on file  . Highest education level: Not on file  Occupational History  . Not on file  Tobacco Use  . Smoking status: Former Smoker    Packs/day: 1.00    Years: 6.00    Pack years: 6.00    Types: Cigarettes  . Smokeless tobacco: Never Used  . Tobacco comment: Quit 30 years ago  Vaping Use  . Vaping Use: Never used  Substance and Sexual Activity  . Alcohol use: Yes    Comment: Occasionally  . Drug use: No  . Sexual activity: Not Currently  Other Topics Concern  . Not on file  Social History Narrative    Working in Systems developer at [employer].  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     Social Determinants of Health   Financial Resource Strain: Low Risk   . Difficulty of Paying Living Expenses: Not hard at all  Food Insecurity: No Food Insecurity  . Worried About Charity fundraiser in the Last Year: Never true  . Ran Out of Food in the Last Year: Never true  Transportation Needs: No Transportation Needs  . Lack of Transportation (Medical): No  . Lack of Transportation (Non-Medical): No  Physical Activity: Sufficiently Active  . Days of Exercise per Week: 4 days  . Minutes of Exercise per Session: 90 min  Stress: No Stress Concern Present  . Feeling of Stress : Not at all  Social Connections: Moderately Integrated  . Frequency of Communication with Friends and Family: More than three times a week  . Frequency of Social Gatherings with Friends and Family: Twice a week  . Attends Religious Services: 1 to 4 times per year  . Active Member of Clubs or Organizations: No  . Attends Archivist Meetings: Never  . Marital Status: Married    Review of Systems  Constitutional: Negative for activity change, appetite change and unexpected weight change.  HENT: Negative for congestion and sinus pain.   Respiratory: Negative for apnea, chest tightness and shortness of breath.   Cardiovascular: Negative for palpitations.  Gastrointestinal: Negative for abdominal distention and abdominal pain.  Endocrine: Negative for polyuria.  Genitourinary: Negative for difficulty urinating, dyspareunia and urgency.  Musculoskeletal: Positive for arthralgias. Negative for back pain.  Skin: Negative.   Neurological: Negative.   Psychiatric/Behavioral: Negative.      Objective:  BP 102/80   Pulse 76   Temp (!) 97.4 F (36.3 C)   Resp 16   Ht 5\' 5"  (1.651 m)   Wt 243 lb (110.2 kg)   SpO2 95%   BMI 40.44 kg/m   BP/Weight 01/01/2021 10/26/2020 12/45/8099  Systolic BP 833 825 053   Diastolic BP 80 68 88  Wt. (Lbs) 243 238.2 237  BMI 40.44 39.64 39.44    Physical Exam Vitals reviewed.  Constitutional:      General: She is not in acute distress.    Appearance: Normal appearance.  HENT:     Right Ear: Tympanic membrane, ear canal and external ear normal.     Left Ear: Tympanic membrane, ear canal and external ear normal.     Mouth/Throat:     Mouth: Mucous membranes are moist.     Pharynx: Oropharynx is clear.  Eyes:     Extraocular Movements: Extraocular movements intact.     Conjunctiva/sclera: Conjunctivae normal.     Pupils:  Pupils are equal, round, and reactive to light.  Cardiovascular:     Rate and Rhythm: Normal rate and regular rhythm.     Pulses: Normal pulses.     Heart sounds: Normal heart sounds. No murmur heard. No gallop.   Pulmonary:     Effort: Pulmonary effort is normal.     Breath sounds: Normal breath sounds. No rales.  Abdominal:     General: Abdomen is flat. Bowel sounds are normal. There is no distension.     Palpations: Abdomen is soft.     Tenderness: There is no abdominal tenderness.  Musculoskeletal:        General: Normal range of motion.     Cervical back: Normal range of motion and neck supple.  Skin:    General: Skin is warm and dry.     Capillary Refill: Capillary refill takes less than 2 seconds.  Neurological:     General: No focal deficit present.     Mental Status: She is alert and oriented to person, place, and time. Mental status is at baseline.  Psychiatric:        Mood and Affect: Mood normal.        Thought Content: Thought content normal.       Lab Results  Component Value Date   WBC 10.7 (H) 10/16/2020   HGB 16.5 (H) 10/16/2020   HCT 48.1 (H) 10/16/2020   PLT 260 10/16/2020   GLUCOSE 100 (H) 10/16/2020   CHOL 175 07/17/2020   TRIG 239 (H) 07/17/2020   HDL 45 07/17/2020   LDLCALC 90 07/17/2020   ALT 33 (H) 07/17/2020   AST 28 07/17/2020   NA 136 10/16/2020   K 3.6 10/16/2020   CL 97 (L)  10/16/2020   CREATININE 0.93 10/16/2020   BUN 18 10/16/2020   CO2 29 10/16/2020   TSH 2.880 07/17/2020   INR 1.0 04/04/2020   HGBA1C 6.1 10/25/2020      Assessment & Plan:  Diagnoses and all orders for this visit: Chronic pain syndrome Patient sees pain clinic for pain medicines AN INDIVIDUAL CARE PLAN was established and reinforced today.  The patient's status was assessed using clinical findings on exam, labs, and other diagnostic testing. Patient's success at meeting treatment goals based on disease specific evidence-bassed guidelines and found to be in fair control. RECOMMENDATIONS include maintining present medicines and treatment. sHe is on chronicoxycodone with no abuse.  Negative REMS.  Other hyperlipidemia -     Lipid panel AN INDIVIDUAL CARE PLAN for hyperlipidemia/ cholesterol was established and reinforced today.  The patient's status was assessed using clinical findings on exam, lab and other diagnostic tests. The patient's disease status was assessed based on evidence-based guidelines and found to be fair controlled. MEDICATIONS were reviewed. SELF MANAGEMENT GOALS have been discussed and patient's success at attaining the goal of low cholesterol was assessed. RECOMMENDATION given include regular exercise 3 days a week and low cholesterol/low fat diet. CLINICAL SUMMARY including written plan to identify barriers unique to the patient due to social or economic  reasons was discussed.  Chronic systolic congestive heart failure [facility]) An individualized care plan was established and reinforced.  The patient's disease status was assessed using clinical finding son exam today, labs, and/or other diagnostic testing such as x-rays, to determine the patient's success in meeting treatmentgoalsbased on disease-based guidelines and found to beimproving. But not at goal yet. Medications prescriptions no changes Laboratory tests ordered to be performed today include  routine. RECOMMENDATIONS: given  include see cardiology about swelling.  Call physician is patient gains 3 lbs in one day or 5 lbs for one week.  Call for progressive PND, orthopnea or increased pedal edema.  Fibromuscular dysplasia ([place]) AN INDIVIDUAL CARE PLAN for fibromuscular dysplasiawas established and reinforced today.  The patient's status was assessed using clinical findings on exam, labs, and other diagnostic testing. Patient's success at meeting treatment goals based on disease specific evidence-bassed guidelines and found to be in fair control. RECOMMENDATIONS include maintaining present medicines and treatment.  Morbid obesity ([place]) An individualize plan was formulated for obesity using patient history and physical exam to encourage weight loss.  An evidence based program was formulated.  Patient is to cut portion size with meals and to plan physical exercise 3 days a week at least 20 minutes.  Weight watchers and other programs are helpful.  Planned amount of weight loss 10 lbs.  Hypertensive heart disease with heart failure (HCC) -     CBC with Differential/Platelet -     Comprehensive metabolic panel An individual hypertension care plan was established and reinforced today.  The patient's status was assessed using clinical findings on exam and labs or diagnostic tests. The patient's success at meeting treatment goals on disease specific evidence-based guidelines and found to be fair controlled. SELF MANAGEMENT: The patient and I together assessed ways to personally work towards obtaining the recommended goals. RECOMMENDATIONS: avoid decongestants found in common cold remedies, decrease consumption of alcohol, perform routine monitoring of BP with home BP cuff, exercise, reduction of dietary salt, take medicines as prescribed, try not to miss doses and quit smoking.  Regular exercise and maintaining a healthy weight is needed.  Stress reduction may help. A CLINICAL SUMMARY including  written plan identify barriers to care unique to individual due to social or financial issues.  We attempt to mutually creat solutions for individual and family understanding. BMI 40.0-44.9, adult [facility]) An individualize plan was formulated for obesity using patient history and physical exam to encourage weight loss.  An evidence based program was formulated.  Patient is to cut portion size with meals and to plan physical exercise 3 days a week at least 20 minutes.  Weight watchers and other programs are helpful.  Planned amount of weight loss 10 lbs.        I spent 34minutes dedicated to the care of this patient on the date of this encounter to include face-to-face time with the patient, as well as: review all her records  Follow-up: Return in about 3 months (around 03/31/2021).  An After Visit Summary was printed and given to the patient.  Reinaldo Meeker, MD [facility] [phone]

## 2021-01-02 LAB — CBC WITH DIFFERENTIAL/PLATELET
Basophils Absolute: 0 10*3/uL (ref 0.0–0.2)
Basos: 0 %
EOS (ABSOLUTE): 0.2 10*3/uL (ref 0.0–0.4)
Eos: 2 %
Hematocrit: 50.2 % — ABNORMAL HIGH (ref 34.0–46.6)
Hemoglobin: 17.1 g/dL — ABNORMAL HIGH (ref 11.1–15.9)
Immature Grans (Abs): 0 10*3/uL (ref 0.0–0.1)
Immature Granulocytes: 1 %
Lymphocytes Absolute: 2.3 10*3/uL (ref 0.7–3.1)
Lymphs: 27 %
MCH: 31.6 pg (ref 26.6–33.0)
MCHC: 34.1 g/dL (ref 31.5–35.7)
MCV: 93 fL (ref 79–97)
Monocytes Absolute: 0.6 10*3/uL (ref 0.1–0.9)
Monocytes: 7 %
Neutrophils Absolute: 5.3 10*3/uL (ref 1.4–7.0)
Neutrophils: 63 %
Platelets: 275 10*3/uL (ref 150–450)
RBC: 5.41 x10E6/uL — ABNORMAL HIGH (ref 3.77–5.28)
RDW: 12.4 % (ref 11.7–15.4)
WBC: 8.5 10*3/uL (ref 3.4–10.8)

## 2021-01-02 LAB — CARDIOVASCULAR RISK ASSESSMENT

## 2021-01-02 LAB — COMPREHENSIVE METABOLIC PANEL
ALT: 46 IU/L — ABNORMAL HIGH (ref 0–32)
AST: 31 IU/L (ref 0–40)
Albumin/Globulin Ratio: 1.6 (ref 1.2–2.2)
Albumin: 4.7 g/dL (ref 3.8–4.9)
Alkaline Phosphatase: 126 IU/L — ABNORMAL HIGH (ref 44–121)
BUN/Creatinine Ratio: 17 (ref 9–23)
BUN: 17 mg/dL (ref 6–24)
Bilirubin Total: 0.8 mg/dL (ref 0.0–1.2)
CO2: 25 mmol/L (ref 20–29)
Calcium: 10.4 mg/dL — ABNORMAL HIGH (ref 8.7–10.2)
Chloride: 95 mmol/L — ABNORMAL LOW (ref 96–106)
Creatinine, Ser: 1.01 mg/dL — ABNORMAL HIGH (ref 0.57–1.00)
GFR calc Af Amer: 70 mL/min/{1.73_m2} (ref >59)
GFR calc non Af Amer: 61 mL/min/{1.73_m2} (ref >59)
Globulin, Total: 2.9 g/dL (ref 1.5–4.5)
Glucose: 144 mg/dL — ABNORMAL HIGH (ref 65–99)
Potassium: 4.5 mmol/L (ref 3.5–5.2)
Sodium: 138 mmol/L (ref 134–144)
Total Protein: 7.6 g/dL (ref 6.0–8.5)

## 2021-01-02 LAB — LIPID PANEL
Chol/HDL Ratio: 4 ratio (ref 0.0–4.4)
Cholesterol, Total: 190 mg/dL (ref 100–199)
HDL: 47 mg/dL (ref >39)
LDL Chol Calc (NIH): 103 mg/dL — ABNORMAL HIGH (ref 0–99)
Triglycerides: 236 mg/dL — ABNORMAL HIGH (ref 0–149)
VLDL Cholesterol Cal: 40 mg/dL (ref 5–40)

## 2021-01-02 NOTE — Progress Notes (Signed)
Hemoglobin/hematocrit high, glucose 144, kidney tests normal, one liver test high, triglycerides 236 need to be on DASh diet lp

## 2021-01-14 ENCOUNTER — Other Ambulatory Visit: Payer: Self-pay | Admitting: Legal Medicine

## 2021-01-14 DIAGNOSIS — G894 Chronic pain syndrome: Secondary | ICD-10-CM

## 2021-01-15 ENCOUNTER — Ambulatory Visit: Payer: HMO | Admitting: Neurology

## 2021-01-15 ENCOUNTER — Encounter: Payer: Self-pay | Admitting: Neurology

## 2021-01-15 ENCOUNTER — Telehealth: Payer: Self-pay | Admitting: *Deleted

## 2021-01-15 ENCOUNTER — Other Ambulatory Visit: Payer: Self-pay

## 2021-01-15 VITALS — BP 101/68 | HR 65 | Ht 65.0 in | Wt 240.0 lb

## 2021-01-15 DIAGNOSIS — G43711 Chronic migraine without aura, intractable, with status migrainosus: Secondary | ICD-10-CM

## 2021-01-15 DIAGNOSIS — I720 Aneurysm of carotid artery: Secondary | ICD-10-CM

## 2021-01-15 DIAGNOSIS — R519 Headache, unspecified: Secondary | ICD-10-CM

## 2021-01-15 DIAGNOSIS — G43109 Migraine with aura, not intractable, without status migrainosus: Secondary | ICD-10-CM

## 2021-01-15 DIAGNOSIS — G43909 Migraine, unspecified, not intractable, without status migrainosus: Secondary | ICD-10-CM | POA: Insufficient documentation

## 2021-01-15 MED ORDER — EMGALITY 120 MG/ML ~~LOC~~ SOAJ: 120.0000 mg | SUBCUTANEOUS | 11 refills | Status: DC

## 2021-01-15 MED ORDER — UBRELVY 100 MG PO TABS: 100.0000 mg | ORAL_TABLET | ORAL | 0 refills | Status: DC | PRN

## 2021-01-15 MED ORDER — HYDROCODONE-ACETAMINOPHEN 7.5-325 MG PO TABS: 1.0000 | ORAL_TABLET | Freq: Four times a day (QID) | ORAL | 0 refills | Status: DC | PRN

## 2021-01-15 MED ORDER — EMGALITY 120 MG/ML ~~LOC~~ SOAJ: 120.0000 mg | SUBCUTANEOUS | 0 refills | Status: DC

## 2021-01-15 MED ORDER — UBRELVY 100 MG PO TABS: 100.0000 mg | ORAL_TABLET | ORAL | 6 refills | Status: DC | PRN

## 2021-01-15 NOTE — Telephone Encounter (Signed)
Dr Jaynee Eagles will be placing an order for CT head and blood vessels. Pt would like this order to be sent to [place] where she lives.

## 2021-01-15 NOTE — Patient Instructions (Signed)
CT of the head and blood vessels Start Emgality as headache prevention: monthly Start Ubrelvy as cute headache medication: Please take one tablet at the onset of your headache. If it does not improve the symptoms please take one additional tablet. Do not take more then 2 tablets in 24hrs. Do not take use more then 2 to 3 times in a week.  Ubrogepant tablets What is this medicine? UBROGEPANT (ue BROE je pant) is used to treat migraine headaches with or without aura. An aura is a strange feeling or visual disturbance that warns you of an attack. It is not used to prevent migraines. This medicine may be used for other purposes; ask your health care provider or pharmacist if you have questions. COMMON BRAND NAME(S): Roselyn Meier What should I tell my health care provider before I take this medicine? They need to know if you have any of these conditions:  kidney disease  liver disease  an unusual or allergic reaction to ubrogepant, other medicines, foods, dyes, or preservatives  pregnant or trying to get pregnant  breast-feeding How should I use this medicine? Take this medicine by mouth with a glass of water. Follow the directions on the prescription label. You can take it with or without food. If it upsets your stomach, take it with food. Take your medicine at regular intervals. Do not take it more often than directed. Do not stop taking except on your doctor's advice. Talk to your pediatrician about the use of this medicine in children. Special care may be needed. Overdosage: If you think you have taken too much of this medicine contact a poison control center or emergency room at once. NOTE: This medicine is only for you. Do not share this medicine with others. What if I miss a dose? This does not apply. This medicine is not for regular use. What may interact with this medicine? Do not take this medicine with any of the following medicines:  ceritinib  certain antibiotics like  chloramphenicol, clarithromycin, telithromycin  certain antivirals for HIV like atazanavir, cobicistat, darunavir, delavirdine, fosamprenavir, indinavir, ritonavir  certain medicines for fungal infections like itraconazole, ketoconazole, posaconazole, voriconazole  conivaptan  grapefruit  idelalisib  mifepristone  nefazodone  ribociclib This medicine may also interact with the following medications:  carvedilol  certain medicines for seizures like phenobarbital, phenytoin  ciprofloxacin  cyclosporine  eltrombopag  fluconazole  fluvoxamine  quinidine  rifampin  St. John's wort  verapamil This list may not describe all possible interactions. Give your health care provider a list of all the medicines, herbs, non-prescription drugs, or dietary supplements you use. Also tell them if you smoke, drink alcohol, or use illegal drugs. Some items may interact with your medicine. What should I watch for while using this medicine? Visit your health care professional for regular checks on your progress. Tell your health care professional if your symptoms do not start to get better or if they get worse. Your mouth may get dry. Chewing sugarless gum or sucking hard candy and drinking plenty of water may help. Contact your health care professional if the problem does not go away or is severe. What side effects may I notice from receiving this medicine? Side effects that you should report to your doctor or health care professional as soon as possible:  allergic reactions like skin rash, itching or hives; swelling of the face, lips, or tongue Side effects that usually do not require medical attention (report these to your doctor or health care professional if they  continue or are bothersome):  drowsiness  dry mouth  nausea  tiredness This list may not describe all possible side effects. Call your doctor for medical advice about side effects. You may report side effects to FDA at  1-800-FDA-1088. Where should I keep my medicine? Keep out of the reach of children. Store at room temperature between 15 and 30 degrees C (59 and 86 degrees F). Throw away any unused medicine after the expiration date. NOTE: This sheet is a summary. It may not cover all possible information. If you have questions about this medicine, talk to your doctor, pharmacist, or health care provider.  2021 Elsevier/Gold Standard (2019-01-21 08:50:55) Galcanezumab injection What is this medicine? GALCANEZUMAB (gal ka NEZ ue mab) is used to prevent migraines and treat cluster headaches. This medicine may be used for other purposes; ask your health care provider or pharmacist if you have questions. COMMON BRAND NAME(S): Emgality What should I tell my health care provider before I take this medicine? They need to know if you have any of these conditions:  an unusual or allergic reaction to galcanezumab, other medicines, foods, dyes, or preservatives  pregnant or trying to get pregnant  breast-feeding How should I use this medicine? This medicine is for injection under the skin. You will be taught how to prepare and give this medicine. Use exactly as directed. Take your medicine at regular intervals. Do not take your medicine more often than directed. It is important that you put your used needles and syringes in a special sharps container. Do not put them in a trash can. If you do not have a sharps container, call your pharmacist or healthcare provider to get one. Talk to your pediatrician regarding the use of this medicine in children. Special care may be needed. Overdosage: If you think you have taken too much of this medicine contact a poison control center or emergency room at once. NOTE: This medicine is only for you. Do not share this medicine with others. What if I miss a dose? If you miss a dose, take it as soon as you can. If it is almost time for your next dose, take only that dose. Do not take  double or extra doses. What may interact with this medicine? Interactions are not expected. This list may not describe all possible interactions. Give your health care provider a list of all the medicines, herbs, non-prescription drugs, or dietary supplements you use. Also tell them if you smoke, drink alcohol, or use illegal drugs. Some items may interact with your medicine. What should I watch for while using this medicine? Tell your doctor or healthcare professional if your symptoms do not start to get better or if they get worse. What side effects may I notice from receiving this medicine? Side effects that you should report to your doctor or health care professional as soon as possible:  allergic reactions like skin rash, itching or hives, swelling of the face, lips, or tongue Side effects that usually do not require medical attention (report these to your doctor or health care professional if they continue or are bothersome):  pain, redness, or irritation at site where injected This list may not describe all possible side effects. Call your doctor for medical advice about side effects. You may report side effects to FDA at 1-800-FDA-1088. Where should I keep my medicine? Keep out of the reach of children. You will be instructed on how to store this medicine. Throw away any unused medicine after the expiration  date on the label. NOTE: This sheet is a summary. It may not cover all possible information. If you have questions about this medicine, talk to your doctor, pharmacist, or health care provider.  2021 Elsevier/Gold Standard (2018-04-22 12:03:23)

## 2021-01-15 NOTE — Progress Notes (Signed)
[facility]    Provider:  Dr Jaynee Eagles Requesting Provider: Lillard Anes,* Primary Care Provider:  Lillard Anes, MD  CC:  migraines  HPI:  Allison White is a 60 y.o. female here as requested by Lillard Anes,* for intractable headaches and intractable migraines. PMHx intractable migraine, chronic daily headache, chronic pain, obesity, vertigo, aneurysm ICA s/p coronary angioplasty with left tandem stents, cerebral fibromuscular dysplasia(saw [facility] who did not think imaging was strongly suggestive), , migraine, HLD,cardiomyopathy and heart failure s/p pacemaker and defibrillator.  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  She underwent stent placement for the left internal carotid artery pseudoaneurysm and then a second stent in 2010 in the left.  Due to fibromuscular dysplasia on her initial angiogram, she had been to the [facility] clinic and saw Dr. Gita Kudo who did not feel her imaging was strongly suggestive of FMD.  She has had chronic headaches since then and has been evaluated at multiple clinics including pain clinics.  She has had 0 headaches free in a month since 2009.  Currently she does take Vicodin daily as needed for pain relief.     She is here with her husband who also provides much information and states she had a dissection of her carotid artery and deveshwar put in a stent, the headaches started at the onset of her dissection. Worse with pressure changes. Worse moving her head. Always comes from the back of the head, throbbing, shoots to behind the eyes, The right started first, then the left started hurting. Massage would help. There all the time, the throbbing is always a 2-3/10 if she moves her head too much,  hurts badly when she lays down. She has had light and sound sensitivity, can start with a lightning bolt in the right eye, she gets vertigo badly on the left, she has clogged ears on the left, 15 migrainous days can last 24 hours, throbbing/pulksating/pounding, light sensitivity, nausea when it gets very bad, movement makes it worse. Botox made it worse. Botox was a failure. Ongoing at this frequency and severity for years since 2009. Has tried multiplemedications. No other focal neurologic deficits, associated symptoms, inciting events or modifiable factors.   Reviewed notes, labs and imaging from outside physicians, which showed:  From a thorough review of records, medications tried that can be used in migraine management include: Aspirin, Tylenol, hydrocodone, labetalol, lisinopril, metoprolol, Zofran, verapamil, Zoloft, Topamax(abdomoinal pain and diarrhea), Neupro dependent, Zonegran, botox, amitriptyline, Vicodin, Valium, gabapentin, Cymbalta, vestibular therapy, headache supplements   01/01/21: creatinine 1.01 BUN 17  08/11/2013: IMPRESSION:  CTA HEAD IMPRESSION   1. The left ICA petrous segment stent is patent.  2. Normal variant CTA head without evidence for significant proximal  stenosis, aneurysm, or branch vessel occlusion.   CTA NECK IMPRESSION   1. Tandem left ICA stents are patent without evidence for expansion  of the fusiform left ICA aneurysm.  2. Slight irregularity in the high cervical right internal carotid  artery suggesting fibromuscular dysplasia describes. This finding is  new since the prior exam.  3. Focal tortuosity and looping of the vertebral arteries  bilaterally at the C2 level.    Review of Systems: Patient complains of symptoms per HPI as well as the following symptoms: intractable headache. Pertinent negatives and positives per HPI. All  others negative.   Social History   Socioeconomic History  . Marital status: Married    Spouse name: Not on file   . Number of children: 4  . Years of education: Not on file  . Highest education level: High school graduate  Occupational History  . Not on file  Tobacco Use  . Smoking status: Former Smoker    Packs/day: 1.00    Years: 6.00    Pack years: 6.00    Types: Cigarettes  . Smokeless tobacco: Never Used  . Tobacco comment: Quit over 30 years ago  Vaping Use  . Vaping Use: Never used  Substance and Sexual Activity  . Alcohol use: Yes    Comment: Occasionally "little"  . Drug use: No  . Sexual activity: Not Currently  Other Topics Concern  . Not on file  Social History Narrative   Lives with husband in a one-story home.     Right handed   Caffeine: "very little"   Social Determinants of Health   Financial Resource Strain: Low Risk   . Difficulty of Paying Living Expenses: Not hard at all  Food Insecurity: No Food Insecurity  . Worried About Charity fundraiser in the Last Year: Never true  . Ran Out of Food in the Last Year: Never true  Transportation Needs: No Transportation Needs  . Lack of Transportation (Medical): No  . Lack of Transportation (Non-Medical): No  Physical Activity: Sufficiently Active  . Days of Exercise per Week: 4 days  . Minutes of Exercise per Session: 90 min  Stress: No Stress Concern Present  . Feeling of Stress : Not at all  Social Connections: Moderately Integrated  . Frequency of Communication with Friends and Family: More than three times a week  . Frequency of Social Gatherings with Friends and Family: Twice a week  . Attends Religious Services: 1 to 4 times per year  . Active Member of Clubs or Organizations: No  . Attends Archivist Meetings: Never  . Marital Status: Married  Human resources officer Violence: Not At Risk  . Fear of Current or Ex-Partner: No  . Emotionally Abused: No  . Physically Abused: No  . Sexually Abused: No    Family History  Problem Relation Age of Onset  . Thyroid disease Mother   . Prostate cancer Father         Prostate  . Hypertension Brother   . Cerebral aneurysm Paternal Grandmother        Nonruptured  . Dementia Paternal Grandfather   . Prostate cancer Paternal Grandfather     Past Medical History:  Diagnosis Date  . FMD (facioscapulohumeral muscular dystrophy) ([place])   . Headache(784.0)   . Heart failure ([place])   . Hyperlipidemia   . MI (myocardial infarction) ([place])   . Migraine   . Nonruptured cerebral aneurysm, internal carotid artery    Left side, stent placement (2009)  . Pseudoaneurysm ([place])    both carotids   . Vertigo     Patient Active Problem List   Diagnosis Date Noted  . Chronic migraine without aura, with intractable migraine, so stated, with status migrainosus 01/15/2021  . Migraine with aura and without status migrainosus, not intractable 01/15/2021  . BMI 40.0-44.9, adult ([place]) 01/01/2021  . Routine general medical examination at a health care facility 10/26/2020  . Chronic pain 04/12/2020  . Pacemaker 04/12/2020  . Morbid obesity ([place]) 04/10/2018  . Myalgia 09/22/2017  . Cardiomyopathy ([place]) 07/31/2017  . Presence of  automatic (implantable) cardiac defibrillator 02/25/2017  . Decreased cardiac ejection fraction 01/23/2017  . Fibromuscular dysplasia ([place]) 12/06/2016  . Chronic systolic congestive heart failure ([place]) 12/05/2016  . Hypertensive heart disease with heart failure ([place]) 12/05/2016  . Pleural effusion 12/01/2016  . Intractable migraine without aura and with status migrainosus 01/31/2015  . Chronic daily headache 07/23/2013  . Carotid aneurysm, left ([place]) 07/23/2013  . Vertigo   . Headache(784.0)   . Hyperlipidemia     Past Surgical History:  Procedure Laterality Date  . ABDOMINAL HYSTERECTOMY    . CARDIAC DEFIBRILLATOR PLACEMENT    . CEREBRAL ANEURYSM REPAIR Left 2009  . CESAREAN SECTION    . CORONARY ANGIOPLASTY WITH STENT PLACEMENT    . PACEMAKER IMPLANT    . RIGHT/LEFT HEART CATH AND CORONARY ANGIOGRAPHY N/A 04/07/2020    Procedure: RIGHT/LEFT HEART CATH AND CORONARY ANGIOGRAPHY;  Surgeon: Jolaine Artist, MD;  Location: [facility];  Service: Cardiovascular;  Laterality: N/A;  . TUBAL LIGATION      Current Outpatient Medications  Medication Sig Dispense Refill  . aspirin 81 MG EC tablet Take 81 mg by mouth daily.     [name] atorvastatin (LIPITOR) 20 MG tablet Take 1 tablet (20 mg total) by mouth daily. 90 tablet 1  . dapagliflozin propanediol (FARXIGA) 10 MG TABS tablet Take 1 tablet (10 mg total) by mouth daily. 30 tablet 2  . digoxin (LANOXIN) 0.125 MG tablet TAKE 1 TABLET BY MOUTH ONCE DAILY. APPT REQUIRED FOR FUTURE REFILLS 30 tablet 0  . ENTRESTO 24-26 MG Take 1 tablet by mouth twice daily 60 tablet 6  . HYDROcodone-acetaminophen (NORCO) 7.5-325 MG tablet Take 1 tablet by mouth every 6 (six) hours as needed for moderate pain. 90 tablet 0  . ivabradine (CORLANOR) 7.5 MG TABS tablet Take 1 tablet (7.5 mg total) by mouth 2 (two) times daily with a meal. 60 tablet 11  . LORATADINE PO Take by mouth daily. [facility] brand    . metoprolol succinate (TOPROL-XL) 50 MG 24 hr tablet Take 1 tablet by mouth twice daily 180 tablet 0  . nitroGLYCERIN (NITROSTAT) 0.4 MG SL tablet Place 0.4 mg under the tongue every 5 (five) minutes as needed for chest pain.    [name] spironolactone (ALDACTONE) 25 MG tablet Take 1 tablet (25 mg total) by mouth daily. 30 tablet 6  . torsemide (DEMADEX) 100 MG tablet Take 0.5 tablets (50 mg total) by mouth daily. 45 tablet 3  . Galcanezumab-gnlm (EMGALITY) 120 MG/ML SOAJ Inject 120 mg into the skin every 30 (thirty) days. 1 mL 0  . Ubrogepant (UBRELVY) 100 MG TABS Take 100 mg by mouth every 2 (two) hours as needed. Maximum 200mg  a day. 2 tablet 0   No current facility-administered medications for this visit.    Allergies as of 01/15/2021 - Review Complete 01/15/2021  Allergen Reaction Noted  . Tape Rash 02/20/2017  . Plavix [clopidogrel bisulfate] Other (See Comments) 08/27/2012     Vitals: BP 101/68 (BP Location: Right Arm, Patient Position: Sitting, Cuff Size: Large)   Pulse 65   Ht 5\' 5"  (1.651 m)   Wt 240 lb (108.9 kg)   BMI 39.94 kg/m  Last Weight:  Wt Readings from Last 1 Encounters:  01/15/21 240 lb (108.9 kg)   Last Height:   Ht Readings from Last 1 Encounters:  01/15/21 5\' 5"  (1.651 m)     Physical exam: Exam: Gen: NAD, conversant, well nourised, obese, well groomed  CV: RRR, no MRG. No Carotid Bruits. No peripheral edema, warm, nontender Eyes: Conjunctivae clear without exudates or hemorrhage  Neuro: Detailed Neurologic Exam  Speech:    Speech is normal; fluent and spontaneous with normal comprehension.  Cognition:    The patient is oriented to person, place, and time;     recent and remote memory intact;     language fluent;     normal attention, concentration,     fund of knowledge Cranial Nerves:    The pupils are equal, round, and reactive to light. The fundi are normal and spontaneous venous pulsations are present. Visual fields are full to finger confrontation. Extraocular movements are intact. Trigeminal sensation is intact and the muscles of mastication are normal. The face is symmetric. The palate elevates in the midline. Hearing intact. Voice is normal. Shoulder shrug is normal. The tongue has normal motion without fasciculations.   Coordination:    No dysmetria or ataxia   Gait:    Normal native gait   Motor Observation:    No asymmetry, no atrophy, and no involuntary movements noted. Tone:    Normal muscle tone.    Posture:    Posture is normal. normal erect    Strength:    Strength is V/V in the upper and lower limbs.      Sensation: intact to LT     Reflex Exam:  DTR's:    Deep tendon reflexes in the upper and lower extremities are brisk for age bilaterally.   Toes:    The toes are downgoing bilaterally.   Clonus:    Clonus is absent.    Assessment/Plan:  60 y.o. female here as  requested by Lillard Anes,* for intractable headaches and intractable migraines. PMHx intractable migraine, chronic daily headache, chronic pain, obesity, vertigo, aneurysm ICA s/p coronary angioplasty with left tandem stents, possible right old carotid dissection per CTA imaging 2014, cerebral fibromuscular dysplasia([facility] who did not think imaging was strongly suggestive), , migraine, HLD,cardiomyopathy and heart failure s/p pacemaker and defibrillator.  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  She underwent stent placement for the left internal carotid artery pseudoaneurysm and then a second stent in 2010 in the left.  Due to fibromuscular dysplasia on her initial angiogram, she had been to the [facility] clinic and saw Dr. Gita Kudo who did not feel her imaging was strongly suggestive of FMD.  She has had chronic headaches since then and has been evaluated at multiple clinics including pain clinics, neurology clinics.  She has had 0 headaches free in a month since 2009.  Currently she does take Vicodin daily as needed for pain relief.  From a thorough review of records, medications tried that can be used in migraine management include: Aspirin, Tylenol, hydrocodone, labetalol, lisinopril, metoprolol, Zofran, verapamil, Zoloft, Topamax(abdomoinal pain and diarrhea), Neupro dependent, Zonegran, botox, amitriptyline, Vicodin, Valium, gabapentin, Cymbalta, vestibular therapy, headache supplements  - CTA H&N to follow intracranial fibromuscular dysplasia,  s/p stenting for left ICA aneurysm - Would hesitate to give triptans to this patient, contraindicated, try Ubrelvy for acute management of migraine - has failed multiple classes of medications including Botox: try  Emgality  Discussed: To prevent or relieve headaches, try the following: Cool Compress. Lie down and place a cool compress on your head.  Avoid headache triggers.  If certain foods or odors seem to have triggered your migraines in the past, avoid them. A headache diary might help you identify triggers.  Include physical activity in your daily routine. Try a daily walk or other moderate aerobic exercise.  Manage stress. Find healthy ways to cope with the stressors, such as delegating tasks on your to-do list.  Practice relaxation techniques. Try deep breathing, yoga, massage and visualization.  Eat regularly. Eating regularly scheduled meals and maintaining a healthy diet might help prevent headaches. Also, drink plenty of fluids.  Follow a regular sleep schedule. Sleep deprivation might contribute to headaches Consider biofeedback. With this mind-body technique, you learn to control certain bodily functions -- such as muscle tension, heart rate and blood pressure -- to prevent headaches or reduce headache pain.    Proceed to emergency room if you experience new or worsening symptoms or symptoms do not resolve, if you have new neurologic symptoms or if headache is severe, or for any concerning symptom.   Provided education on: chronic migraine medication overuse headache, chronic migraines, prevention of migraines, behavioral and other nonpharmacologic treatments for headache.     Orders Placed This Encounter  Procedures  . CT ANGIO HEAD W OR WO CONTRAST  . CT ANGIO NECK W OR WO CONTRAST   Meds ordered this encounter  Medications  . DISCONTD: Galcanezumab-gnlm (EMGALITY) 120 MG/ML SOAJ    Sig: Inject 120 mg into the skin every 30 (thirty) days.    Dispense:  1.12 mL    Refill:  11  . DISCONTD: Ubrogepant (UBRELVY) 100 MG TABS    Sig: Take 100 mg by mouth every 2 (two) hours as needed. Maximum 200mg  a day.    Dispense:  16 tablet    Refill:  6  . Ubrogepant (UBRELVY) 100 MG TABS     Sig: Take 100 mg by mouth every 2 (two) hours as needed. Maximum 200mg  a day.    Dispense:  2 tablet    Refill:  0    Order Specific Question:   Lot Number?    Answer:   [id number]    Order Specific Question:   Expiration Date?    Answer:   12/18/2021    Order Specific Question:   Quantity    Answer:   2  . Galcanezumab-gnlm (EMGALITY) 120 MG/ML SOAJ    Sig: Inject 120 mg into the skin every 30 (thirty) days.    Dispense:  1 mL    Refill:  0    Order Specific Question:   Lot Number?    Answer:   [id number]    Order Specific Question:   Expiration Date?    Answer:   08/17/2022    Order Specific Question:   Quantity    Answer:   2    Comments:   pen    Cc: Lafe Garin, MD  Sarina Ill, MD  [facility] [address] [place], [place] [address]  Phone [phone] Fax [phone]  I spent 110 minutes of face-to-face and non-face-to-face time with patient on the  1. Chronic daily headache   2. Chronic migraine without aura, with intractable migraine, so stated, with status migrainosus   3. Migraine with aura and without status migrainosus, not intractable   4. Carotid aneurysm, left (HCC)    diagnosis.  This included previsit chart review, lab review, study review, order entry, electronic health record documentation, patient education on the different diagnostic and therapeutic options, counseling  and coordination of care, risks and benefits of management, compliance, or risk factor reduction

## 2021-01-15 NOTE — Progress Notes (Signed)
Patient was educated on how to administer Emgality injection and was provided with the instructions to take home for reference. Pt aware of administration sites and proper skin prep.  Patient was given to Emgality 120 mg injections today for loading dose in his upper arms.  Pt tolerated the injections well. Pt is aware the next injection will be 120 mg due on March 28.  They are every 30 days.  Pt's questions were answered.

## 2021-01-16 NOTE — Telephone Encounter (Signed)
Noted, thank you!  healthteam no auth pt is scheduled at [facility] for 01/18/21 to arrive at 2:30 pm pt is aware of time & day. I also gave the patient their number of [phone] incase she needed to r/s.

## 2021-01-17 NOTE — Telephone Encounter (Signed)
I spoke with Allison White at [facility]. I told her that I have never done a PA for health team advantage for a CT. She stated with the type of plan she has it requires. I found a PA form on the Health team website and faxed the information. It is pending.

## 2021-01-17 NOTE — Telephone Encounter (Signed)
Abigail Butts called from [facility] and relayed CT needed auth .  I relayed to Theone Murdoch note from 01/16/2021 . Abigail Butts is stating CT needs auth .   Raquel Sarna when you can you can return's Abigail Butts call at [phone] . Thanks Hinton Dyer

## 2021-01-18 DIAGNOSIS — I773 Arterial fibromuscular dysplasia: Secondary | ICD-10-CM | POA: Diagnosis not present

## 2021-01-18 DIAGNOSIS — I720 Aneurysm of carotid artery: Secondary | ICD-10-CM | POA: Diagnosis not present

## 2021-01-18 NOTE — Telephone Encounter (Signed)
Thank you! I spoke with Allison White at [facility] and informed her I had fax everything yesterday to that fax number and that I got a confirmation from it. She stated she was going to call health team and check the status with them. I also gave her my direct number if she had any of further questions.

## 2021-01-18 NOTE — Telephone Encounter (Signed)
Allison White is calling again and stating to please call  Her . Wendy CX patient's apt .  [phone]  .  Patient has a new plan .   Please fax auth to [phone] Att Um Dept .   Raquel Sarna do you want me to help with something I will .

## 2021-01-21 ENCOUNTER — Encounter (HOSPITAL_COMMUNITY): Payer: Self-pay

## 2021-01-23 NOTE — Telephone Encounter (Signed)
Allison White from [facility] called me back and informed me the insurance did approve it and she had the exam on 01/18/21.

## 2021-01-23 NOTE — Telephone Encounter (Signed)
LVM for Allison White at [facility] to give me a call back. I was checking the status on this patient appt.

## 2021-01-24 ENCOUNTER — Telehealth (HOSPITAL_COMMUNITY): Payer: Self-pay | Admitting: Pharmacy Technician

## 2021-01-24 NOTE — Telephone Encounter (Signed)
Spoke with patient. She is in the coverage gap, causing her co-pays to be unaffordable. Started an Land for [employer], [employer] and [employer]. The applications are at the check in desk for the patient to sign. Will fax once signatures are obtained.  Entresto LOT [id number] EXP 05/23  Farxiga LOT [id number] EXP 06/18/23

## 2021-01-24 NOTE — Telephone Encounter (Addendum)
Received a message from the patient. Her PAN grant is out of funds and she wanted to know if we could renew it or change her medications.   Called and left her a message to call me back so we can discuss. Also replied to her mychart message with the same information.  Charlann Boxer, CPhT

## 2021-01-25 ENCOUNTER — Encounter: Payer: Self-pay | Admitting: Family Medicine

## 2021-01-26 ENCOUNTER — Other Ambulatory Visit: Payer: Self-pay | Admitting: Neurology

## 2021-01-26 DIAGNOSIS — G43711 Chronic migraine without aura, intractable, with status migrainosus: Secondary | ICD-10-CM

## 2021-01-26 MED ORDER — NURTEC 75 MG PO TBDP: 75.0000 mg | ORAL_TABLET | Freq: Every day | ORAL | 6 refills | Status: DC | PRN

## 2021-01-26 NOTE — Telephone Encounter (Signed)
Sent in applications via fax.  Will follow up.  

## 2021-01-27 ENCOUNTER — Other Ambulatory Visit (HOSPITAL_COMMUNITY): Payer: Self-pay | Admitting: Internal Medicine

## 2021-02-04 NOTE — Progress Notes (Signed)
Advanced Heart Failure Clinic Note  Date:  02/05/2021   ID:  Allison White, DOB [birth year], MRN [id number]  Location: Home  Provider location: [place] [facility] Type of Visit: Established patient  PCP:  Lillard Anes, MD  Cardiologist:  No primary care provider on file. Primary HF: [place] Messler  Chief Complaint: Heart Failure follow-up   History of Present Illness:  Allison White is a 60 y.o. female with morbid obesity, HTN, carotid artery dissection with stenting of left carotid due to possible FMD, CAD s/p anterior MI 1/18 (DES to LAD), systolic HF with EF 67-89% s/p Biotronik ICD who is referred by Dr. Bettina Gavia for assistance with management of her HF.  Echo 8/20  EF 20-25% RV ok.   Seen in 5/21 with worsening NYHA IIIb symptoms. Had R/L cath with normal coronary arteries; widely patent LAD stent, EF 20-25%, Normal hemodynamics with CI 3.4. -> CPX  Echo 12/21 EF 25%  Here for HF f/u. Doing well. Denies SOB. Main issue is that BP is running low. Most 90/50s and will get dizzy when she stands up. No edema, orthopnea or PND. Still riding stationary bike. Gardening outside. Says she gained 9 pounds over 2 weeks and had swelling. Torsemide did not take it off. Now taking torsemide 50 daily    CPX 8/21 FVC 3.08 (91%)    FEV1 2.30 (87%%)     FEV1/FVC 75 (94%)     MVV 89 (94%)   RER 1.10 Peak VO2: 15.3 (90% predicted peak VO2) - adjusted to iBW 25.81ml/kg/min VE/VCO2 slope: 33    Studies:  R/L cath 04/07/20 Ao = 102/56 (75) LV = 104/13 RA = 3 RV = 27/4 PA = 28/9 (17) PCW = 7 Fick cardiac output/index = 7.2/3.4 PVR = 1.4 WU FA sat = 99% PA sat = 79%, 81% High SVC sat = 81%  Monitor 10/19 1. Sinus rhythm 2. Rare PVCs and bigeminy (< 1.0%) 3. Two patient-triggered events associated with isolated PVCs.  4. No high-grade arrhythmias    Echo 06/2017 EF 20-25% Echo 02/2018 LVEF 25-30%, Trivial MR, Normal RV, Mild TR, PA peak  pressure 26 mm Hg  CPX 03/17/18 FVC 3.00 (87%), FEV1 2.97(88%), FEV1/FVC 79 (99%)  Peak VO2: 13.8 - When adjusted to the patient's ideal body weight of 142.2 lb (64.5 kg) the peak VO2 is 22.6 ml/kg VE/VCO2 slope: 38 OUES: 1.52 Peak RER: 1.11 Ventilatory Threshold: 11.8 VE/MVV: 95% O2pulse: 8 Interpretation: Mild limitation due to HF and obesity   Past Medical History:  Diagnosis Date  . CHF (congestive heart failure) ([place])   . FMD (facioscapulohumeral muscular dystrophy) ([place])   . Headache(784.0)   . Heart failure ([place])   . Hyperlipidemia   . MI (myocardial infarction) ([place])   . Migraine   . Nonruptured cerebral aneurysm, internal carotid artery    Left side, stent placement (2009)  . 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 Vertigo    Past Surgical History:  Procedure Laterality Date  . ABDOMINAL HYSTERECTOMY    . CARDIAC DEFIBRILLATOR PLACEMENT    . CEREBRAL ANEURYSM REPAIR Left 2009  . CESAREAN SECTION    . CORONARY ANGIOPLASTY WITH STENT PLACEMENT    . PACEMAKER IMPLANT    . RIGHT/LEFT HEART CATH AND CORONARY ANGIOGRAPHY N/A 04/07/2020   Procedure: RIGHT/LEFT HEART CATH AND CORONARY ANGIOGRAPHY;  Surgeon: Jolaine Artist, MD;  Location: [facility];  Service: Cardiovascular;  Laterality: N/A;  .  TUBAL LIGATION       Current Outpatient Medications  Medication Sig Dispense Refill  . aspirin 81 MG EC tablet Take 81 mg by mouth daily.     [name] atorvastatin (LIPITOR) 20 MG tablet Take 1 tablet (20 mg total) by mouth daily. 90 tablet 1  . dapagliflozin propanediol (FARXIGA) 10 MG TABS tablet Take 1 tablet (10 mg total) by mouth daily. 30 tablet 2  . digoxin (LANOXIN) 0.125 MG tablet TAKE 1 TABLET BY MOUTH ONCE DAILY. APPT REQUIRED FOR FUTURE REFILLS 30 tablet 0  . ENTRESTO 24-26 MG Take 1 tablet by mouth twice daily 60 tablet 6  . Galcanezumab-gnlm (EMGALITY) 120 MG/ML SOAJ Inject 120 mg into the skin every 30 (thirty) days. 1 mL 0  .  HYDROcodone-acetaminophen (NORCO) 7.5-325 MG tablet Take 1 tablet by mouth every 6 (six) hours as needed for moderate pain. 90 tablet 0  . ivabradine (CORLANOR) 7.5 MG TABS tablet Take 1 tablet (7.5 mg total) by mouth 2 (two) times daily with a meal. 60 tablet 11  . LORATADINE PO Take by mouth daily. [facility] brand    . metoprolol succinate (TOPROL-XL) 50 MG 24 hr tablet Take 1 tablet by mouth twice daily 180 tablet 0  . nitroGLYCERIN (NITROSTAT) 0.4 MG SL tablet Place 0.4 mg under the tongue every 5 (five) minutes as needed for chest pain.    . Rimegepant Sulfate (NURTEC) 75 MG TBDP Take 75 mg by mouth daily as needed. For migraines. Take as close to onset of migraine as possible. One daily maximum. 8 tablet 6  . spironolactone (ALDACTONE) 25 MG tablet Take 1 tablet (25 mg total) by mouth daily. 30 tablet 6  . torsemide (DEMADEX) 100 MG tablet Take 50 mg by mouth as needed.     No current facility-administered medications for this encounter.    Allergies:   Tape and Plavix [clopidogrel bisulfate]   Social History:  The patient  reports that she has quit smoking. Her smoking use included cigarettes. She has a 6.00 pack-year smoking history. She has never used smokeless tobacco. She reports current alcohol use. She reports that she does not use drugs.   Family History:  The patient's family history includes Cerebral aneurysm in her paternal grandmother; Dementia in her paternal grandfather; Hypertension in her brother; Prostate cancer in her father and paternal grandfather; Thyroid disease in her mother.   ROS:  Please see the history of present illness.   All other systems are personally reviewed and negative.   Body mass index is 40.34 kg/m.  Vitals:   02/05/21 1103  BP: 110/70  Pulse: 67  SpO2: 97%  Weight: 110 kg (242 lb 6.4 oz)    Exam:   General:  Well appearing. No resp difficulty HEENT: normal Neck: supple. no JVD. Carotids 2+ bilat; no bruits. No lymphadenopathy or  thryomegaly appreciated. Cor: PMI nondisplaced. Regular rate & rhythm. No rubs, gallops or murmurs. Lungs: clear Abdomen: obese soft, nontender, nondistended. No hepatosplenomegaly. No bruits or masses. Good bowel sounds. Extremities: no cyanosis, clubbing, rash, edema  Hands puffy but no other edema Neuro: alert & orientedx3, cranial nerves grossly intact. moves all 4 extremities w/o difficulty. Affect pleasant    Recent Labs: 07/17/2020: TSH 2.880 10/16/2020: B Natriuretic Peptide 87.6 01/01/2021: ALT 46; BUN 17; Creatinine, Ser 1.01; Hemoglobin 17.1; Platelets 275; Potassium 4.5; Sodium 138    Wt Readings from Last 3 Encounters:  02/05/21 110 kg (242 lb 6.4 oz)  01/15/21 108.9 kg (240 lb)  01/01/21 110.2 kg (243 lb)      ASSESSMENT AND PLAN:  1. Chronic systolic HF, ICM - Echo 06/1858 LVEF 25-30%, Trivial MR, Normal RV, Mild TR, PA peak pressure 26 mm Hg - s/p Biotronik ICD - Echo 07/13/19 EF 20-25% - Cath 5/21 EF 20-25% normal coronaries. Well compensated hemodynamics with CI 3.4 - Echo 12/21 EF 25% - CPX 8/21 very reassuring - Peak VO2: 15.3 (90% predicted peak VO2) - adjusted to iBW 25.6ml/kg/min. VEVCO2 33  - Volume status looks good. REDS 30%  Continue torsemide 50 daily  - Add compression hose to deal with orthostasis. If no benefit will need to switch Entresto to losartan 25.  - Continue Entresto 24/26 bid for now (see above) - Continue spiro 25 mg daily.  - Continue toprol 50 mg BID - Continue digoxin 0.125 mg.  - Continue Farxiga 10 - Continue ivabradine 7.5 bid - labs today   2. CAD s/p anterior MI 1/18 with DES to LAD in [place] - cath 5/21 normal cors with patent LAD stent - no s/s ischemia  3. Obesity - Continue weight loss efforts  4. 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 No changes  6. Palpitations - Zio  patch 10/19 was ok  - We discussed getting AliveCor device if these persist  - Referred for Sleep study but she cancelled due to cost. Husband said she doesn't snore. I worry OSA may be contributing to her fatigue. Can revisit as needed.  - No change today   Signed, Glori Bickers, MD  02/05/2021 11:17 AM  Advanced [facility] Failure Clinic [facility] [place] [facility] [place] [address] [phone] (office) [phone] (fax)

## 2021-02-05 ENCOUNTER — Ambulatory Visit (HOSPITAL_COMMUNITY)
Admission: RE | Admit: 2021-02-05 | Discharge: 2021-02-05 | Disposition: A | Discharge status: Home / Self Care | Payer: HMO | Source: Ambulatory Visit | Attending: Internal Medicine | Admitting: Internal Medicine

## 2021-02-05 ENCOUNTER — Other Ambulatory Visit: Payer: Self-pay

## 2021-02-05 ENCOUNTER — Encounter (HOSPITAL_COMMUNITY): Payer: Self-pay | Admitting: Internal Medicine

## 2021-02-05 VITALS — BP 110/70 | HR 67 | Wt 242.4 lb

## 2021-02-05 DIAGNOSIS — I25119 Atherosclerotic heart disease of native coronary artery with unspecified angina pectoris: Secondary | ICD-10-CM | POA: Diagnosis not present

## 2021-02-05 DIAGNOSIS — Z955 Presence of coronary angioplasty implant and graft: Secondary | ICD-10-CM | POA: Insufficient documentation

## 2021-02-05 DIAGNOSIS — E785 Hyperlipidemia, unspecified: Secondary | ICD-10-CM | POA: Diagnosis not present

## 2021-02-05 DIAGNOSIS — I110 Hypertensive heart disease with heart failure: Secondary | ICD-10-CM | POA: Insufficient documentation

## 2021-02-05 DIAGNOSIS — Z95810 Presence of automatic (implantable) cardiac defibrillator: Secondary | ICD-10-CM | POA: Insufficient documentation

## 2021-02-05 DIAGNOSIS — I2510 Atherosclerotic heart disease of native coronary artery without angina pectoris: Secondary | ICD-10-CM | POA: Insufficient documentation

## 2021-02-05 DIAGNOSIS — R002 Palpitations: Secondary | ICD-10-CM | POA: Insufficient documentation

## 2021-02-05 DIAGNOSIS — Z79899 Other long term (current) drug therapy: Secondary | ICD-10-CM | POA: Insufficient documentation

## 2021-02-05 DIAGNOSIS — I255 Ischemic cardiomyopathy: Secondary | ICD-10-CM | POA: Insufficient documentation

## 2021-02-05 DIAGNOSIS — Z888 Allergy status to other drugs, medicaments and biological substances status: Secondary | ICD-10-CM | POA: Insufficient documentation

## 2021-02-05 DIAGNOSIS — Z7982 Long term (current) use of aspirin: Secondary | ICD-10-CM | POA: Diagnosis not present

## 2021-02-05 DIAGNOSIS — I5022 Chronic systolic (congestive) heart failure: Secondary | ICD-10-CM

## 2021-02-05 DIAGNOSIS — Z7984 Long term (current) use of oral hypoglycemic drugs: Secondary | ICD-10-CM | POA: Insufficient documentation

## 2021-02-05 DIAGNOSIS — Z87891 Personal history of nicotine dependence: Secondary | ICD-10-CM | POA: Diagnosis not present

## 2021-02-05 DIAGNOSIS — Z8249 Family history of ischemic heart disease and other diseases of the circulatory system: Secondary | ICD-10-CM | POA: Diagnosis not present

## 2021-02-05 DIAGNOSIS — I252 Old myocardial infarction: Secondary | ICD-10-CM | POA: Insufficient documentation

## 2021-02-05 DIAGNOSIS — Z6841 Body Mass Index (BMI) 40.0 and over, adult: Secondary | ICD-10-CM | POA: Diagnosis not present

## 2021-02-05 LAB — BASIC METABOLIC PANEL
Anion gap: 12 (ref 5–15)
BUN: 18 mg/dL (ref 6–20)
CO2: 31 mmol/L (ref 22–32)
Calcium: 10 mg/dL (ref 8.9–10.3)
Chloride: 97 mmol/L — ABNORMAL LOW (ref 98–111)
Creatinine, Ser: 0.93 mg/dL (ref 0.44–1.00)
GFR, Estimated: >60 mL/min (ref >60)
Glucose, Bld: 127 mg/dL — ABNORMAL HIGH (ref 70–99)
Potassium: 4.3 mmol/L (ref 3.5–5.1)
Sodium: 140 mmol/L (ref 135–145)

## 2021-02-05 LAB — BRAIN NATRIURETIC PEPTIDE: B Natriuretic Peptide: 114.3 pg/mL — ABNORMAL HIGH (ref 0.0–100.0)

## 2021-02-05 NOTE — Patient Instructions (Signed)
Labs done today, your results will be available in MyChart, we will contact you for abnormal readings.  Please wear your compression hose daily, place them on as soon as you get up in the morning and remove before you go to bed at night.  Your physician recommends that you schedule a follow-up appointment in: 3-4 months  If you have any questions or concerns before your next appointment please send [place] a message through [place] or call our office at [phone].    TO LEAVE A MESSAGE FOR THE NURSE SELECT OPTION 2, PLEASE LEAVE A MESSAGE INCLUDING: . YOUR NAME . DATE OF BIRTH . CALL BACK NUMBER . REASON FOR CALL**this is important as we prioritize the call backs  [place] AS LONG AS YOU CALL BEFORE 4:00 PM  At the [facility], you and your health needs are our priority. As part of our continuing mission to provide you with exceptional heart care, we have created designated Provider Care Teams. These Care Teams include your primary Cardiologist (physician) and Advanced Practice Providers (APPs- Physician Assistants and Nurse Practitioners) who all work together to provide you with the care you need, when you need it.   You may see any of the following providers on your designated Care Team at your next follow up: [name] Dr Glori Bickers . Dr Loralie Champagne . Dr Vickki Muff . Darrick Grinder, NP . Lyda Jester, [place] . Audry Riles, PharmD   Please be sure to bring in all your medications bottles to every appointment.

## 2021-02-05 NOTE — Progress Notes (Signed)
ReDS Vest / Clip - 02/05/21 1100      ReDS Vest / Clip   Station Marker B    Ruler Value 33    ReDS Value Range Low volume    ReDS Actual Value 30

## 2021-02-12 ENCOUNTER — Telehealth (HOSPITAL_COMMUNITY): Payer: Self-pay | Admitting: Pharmacy Technician

## 2021-02-12 NOTE — Telephone Encounter (Addendum)
Advanced Heart Failure Patient Advocate Encounter   Patient was approved to receive Corlanor from [facility]  Patient ID: [id number] Effective dates: 02/01/21 through 11/17/21  Advanced Heart Failure Patient Advocate Encounter   Patient was approved to receive Farxiga from [employer]  Patient ID: [id number] Effective dates: 02/07/21 through 11/17/21  Advanced Heart Failure Patient Advocate Encounter   Patient was approved to receive Entresto from [employer]  Patient ID: [id number]   Effective dates: 02/01/21 through 11/17/21  Spoke with patient. She was very Patent attorney.  Charlann Boxer, CPhT

## 2021-02-20 ENCOUNTER — Encounter (HOSPITAL_COMMUNITY): Payer: Self-pay

## 2021-02-20 NOTE — Telephone Encounter (Signed)
Whats her BP?  Before starting midodrine may want to change entresto to losartan. Let me know BP first. Thanks.

## 2021-02-21 NOTE — Telephone Encounter (Signed)
Change entrestro to losartan 25 qhs

## 2021-02-22 ENCOUNTER — Telehealth (HOSPITAL_COMMUNITY): Payer: Self-pay | Admitting: *Deleted

## 2021-02-22 MED ORDER — LOSARTAN POTASSIUM 25 MG PO TABS: 25.0000 mg | ORAL_TABLET | Freq: Every day | ORAL | 3 refills | Status: DC

## 2021-02-22 NOTE — Telephone Encounter (Signed)
Allison White, Allison Pascal, MD  You 16 hours ago (4:02 PM)       Change entrestro to losartan 25 qhs

## 2021-03-14 LAB — CUP PACEART REMOTE DEVICE CHECK
Battery Remaining Percentage: 77 %
Battery Voltage: 3.11 V
Brady Statistic RA Percent Paced: 27 %
Brady Statistic RV Percent Paced: 0 %
Date Time Interrogation Session: 20220426065548
HighPow Impedance: 102 Ohm
Implantable Lead Implant Date: 20180410
Implantable Lead Implant Date: 20180410
Implantable Lead Location: 753859
Implantable Lead Location: 753860
Implantable Lead Model: 377
Implantable Lead Model: 402266
Implantable Lead Serial Number: 49794726
Implantable Lead Serial Number: 49838890
Implantable Pulse Generator Implant Date: 20180410
Lead Channel Impedance Value: 522 Ohm
Lead Channel Pacing Threshold Amplitude: 0.6 V
Lead Channel Pacing Threshold Amplitude: 0.6 V
Lead Channel Pacing Threshold Pulse Width: 0.4 ms
Lead Channel Pacing Threshold Pulse Width: 0.4 ms
Lead Channel Sensing Intrinsic Amplitude: 16.5 mV
Lead Channel Sensing Intrinsic Amplitude: 2.3 mV
Lead Channel Setting Pacing Amplitude: 2 V
Lead Channel Setting Pacing Amplitude: 2.5 V
Lead Channel Setting Pacing Pulse Width: 0.4 ms
Lead Channel Setting Sensing Sensitivity: 0.8 mV
Pulse Gen Model: 404622
Pulse Gen Serial Number: 60982098

## 2021-03-15 ENCOUNTER — Ambulatory Visit (INDEPENDENT_AMBULATORY_CARE_PROVIDER_SITE_OTHER): Payer: HMO

## 2021-03-15 DIAGNOSIS — I429 Cardiomyopathy, unspecified: Secondary | ICD-10-CM | POA: Diagnosis not present

## 2021-03-21 ENCOUNTER — Other Ambulatory Visit: Payer: Self-pay | Admitting: Legal Medicine

## 2021-03-21 DIAGNOSIS — G894 Chronic pain syndrome: Secondary | ICD-10-CM

## 2021-03-21 MED ORDER — HYDROCODONE-ACETAMINOPHEN 7.5-325 MG PO TABS: 1.0000 | ORAL_TABLET | Freq: Four times a day (QID) | ORAL | 0 refills | Status: DC | PRN

## 2021-04-02 ENCOUNTER — Ambulatory Visit: Payer: Self-pay | Admitting: Legal Medicine

## 2021-04-03 ENCOUNTER — Encounter: Payer: Self-pay | Admitting: Legal Medicine

## 2021-04-03 ENCOUNTER — Other Ambulatory Visit: Payer: Self-pay

## 2021-04-03 ENCOUNTER — Ambulatory Visit (INDEPENDENT_AMBULATORY_CARE_PROVIDER_SITE_OTHER): Payer: HMO | Admitting: Legal Medicine

## 2021-04-03 VITALS — BP 130/70 | HR 99 | Temp 97.2°F | Ht 65.0 in | Wt 242.0 lb

## 2021-04-03 DIAGNOSIS — G43711 Chronic migraine without aura, intractable, with status migrainosus: Secondary | ICD-10-CM

## 2021-04-03 DIAGNOSIS — Z1231 Encounter for screening mammogram for malignant neoplasm of breast: Secondary | ICD-10-CM | POA: Diagnosis not present

## 2021-04-03 DIAGNOSIS — I110 Hypertensive heart disease with heart failure: Secondary | ICD-10-CM

## 2021-04-03 DIAGNOSIS — Z1211 Encounter for screening for malignant neoplasm of colon: Secondary | ICD-10-CM | POA: Diagnosis not present

## 2021-04-03 DIAGNOSIS — E7849 Other hyperlipidemia: Secondary | ICD-10-CM

## 2021-04-03 DIAGNOSIS — I773 Arterial fibromuscular dysplasia: Secondary | ICD-10-CM

## 2021-04-03 DIAGNOSIS — Z6841 Body Mass Index (BMI) 40.0 and over, adult: Secondary | ICD-10-CM

## 2021-04-03 DIAGNOSIS — I5022 Chronic systolic (congestive) heart failure: Secondary | ICD-10-CM | POA: Diagnosis not present

## 2021-04-03 MED ORDER — VALSARTAN 40 MG PO TABS: 40.0000 mg | ORAL_TABLET | Freq: Every day | ORAL | 3 refills | Status: DC

## 2021-04-03 NOTE — Progress Notes (Signed)
Subjective:  Patient ID: Allison White, female    DOB: 1960-12-04  Age: 60 y.o. MRN: [id number]  Chief Complaint  Patient presents with  . Hypertension    HPI: chronic visit  Patient presents for follow up of hypertension.  Patient tolerating losartan well with side effects.  Patient was diagnosed with hypertension 2010 so has been treated for hypertension for 10 years.Patient is working on maintaining diet and exercise regimen and follows up as directed. Complication include change to valsartan 40mg  since unable to tolerate losartan..  Patient present with type 2 diabetes.  Specifically, this is type 2, noninsulin requiring diabetes, complicated by hypertension and obesity.  Compliance with treatment has been good; patient take medicines as directed, maintains diet and exercise regimen, follows up as directed, and is keeping glucose diary.  Date of  diagnosis 2010.  Depression screen has been performed.Tobacco screen nonsmoker. Current medicines for diabetes farxiga.  Patient is on valsartan for renal protection and unknown for cholesterol control.  Patient performs foot exams daily and last ophthalmologic exam was none  Patient presents for follow up of hypertension.  Patient tolerating metoprol well with side effects.  Patient was diagnosed with hypertension valsartan and metoprolol so has been treated for hypertension for 10 years.Patient is working on maintaining diet and exercise regimen and follows up as directed. Complication include CHF..   Current Outpatient Medications on File Prior to Visit  Medication Sig Dispense Refill  . aspirin 81 MG EC tablet Take 81 mg by mouth daily.     [name] atorvastatin (LIPITOR) 20 MG tablet Take 1 tablet (20 mg total) by mouth daily. 90 tablet 1  . dapagliflozin propanediol (FARXIGA) 10 MG TABS tablet Take 1 tablet (10 mg total) by mouth daily. 30 tablet 2  . digoxin (LANOXIN) 0.125 MG tablet TAKE 1 TABLET BY MOUTH ONCE DAILY. APPT REQUIRED FOR FUTURE  REFILLS 30 tablet 0  . HYDROcodone-acetaminophen (NORCO) 7.5-325 MG tablet Take 1 tablet by mouth every 6 (six) hours as needed for moderate pain. 90 tablet 0  . ivabradine (CORLANOR) 7.5 MG TABS tablet Take 1 tablet (7.5 mg total) by mouth 2 (two) times daily with a meal. 60 tablet 11  . LORATADINE PO Take by mouth daily. [facility] brand    . metoprolol succinate (TOPROL-XL) 50 MG 24 hr tablet Take 1 tablet by mouth twice daily 180 tablet 0  . nitroGLYCERIN (NITROSTAT) 0.4 MG SL tablet Place 0.4 mg under the tongue every 5 (five) minutes as needed for chest pain.    . Rimegepant Sulfate (NURTEC) 75 MG TBDP Take 75 mg by mouth daily as needed. For migraines. Take as close to onset of migraine as possible. One daily maximum. 8 tablet 6  . spironolactone (ALDACTONE) 25 MG tablet Take 1 tablet (25 mg total) by mouth daily. 30 tablet 6  . torsemide (DEMADEX) 100 MG tablet Take 50 mg by mouth as needed.     No current facility-administered medications on file prior to visit.   Past Medical History:  Diagnosis Date  . CHF (congestive heart failure) ([place])   . FMD (facioscapulohumeral muscular dystrophy) ([place])   . Headache(784.0)   . Heart failure ([place])   . Hyperlipidemia   . MI (myocardial infarction) ([place])   . Migraine   . Nonruptured cerebral aneurysm, internal carotid artery    Left side, stent placement (2009)  . 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 Vertigo    Past Surgical History:  Procedure Laterality Date  .  ABDOMINAL HYSTERECTOMY    . CARDIAC DEFIBRILLATOR PLACEMENT    . CEREBRAL ANEURYSM REPAIR Left 2009  . CESAREAN SECTION    . CORONARY ANGIOPLASTY WITH STENT PLACEMENT    . PACEMAKER IMPLANT    . RIGHT/LEFT HEART CATH AND CORONARY ANGIOGRAPHY N/A 04/07/2020   Procedure: RIGHT/LEFT HEART CATH AND CORONARY ANGIOGRAPHY;  Surgeon: Jolaine Artist, MD;  Location: [facility];  Service: Cardiovascular;  Laterality: N/A;  . TUBAL LIGATION      Family History   Problem Relation Age of Onset  . Thyroid disease Mother   . Prostate cancer Father        Prostate  . Hypertension Brother   . Cerebral aneurysm Paternal Grandmother        Nonruptured  . Dementia Paternal Grandfather   . Prostate cancer Paternal Grandfather    Social History   Socioeconomic History  . Marital status: Married    Spouse name: Not on file  . Number of children: 4  . Years of education: Not on file  . Highest education level: High school graduate  Occupational History  . Not on file  Tobacco Use  . Smoking status: Former Smoker    Packs/day: 1.00    Years: 6.00    Pack years: 6.00    Types: Cigarettes  . Smokeless tobacco: Never Used  . Tobacco comment: Quit over 30 years ago  Vaping Use  . Vaping Use: Never used  Substance and Sexual Activity  . Alcohol use: Yes    Comment: Occasionally "little"  . Drug use: No  . Sexual activity: Not Currently  Other Topics Concern  . Not on file  Social History Narrative   Lives with husband in a one-story home.     Right handed   Caffeine: "very little"   Social Determinants of Health   Financial Resource Strain: Not on file  Food Insecurity: Not on file  Transportation Needs: Not on file  Physical Activity: Not on file  Stress: Not on file  Social Connections: Moderately Integrated  . Frequency of Communication with Friends and Family: More than three times a week  . Frequency of Social Gatherings with Friends and Family: Twice a week  . Attends Religious Services: 1 to 4 times per year  . Active Member of Clubs or Organizations: No  . Attends Archivist Meetings: Never  . Marital Status: Married    Review of Systems  Constitutional: Negative for activity change and appetite change.  HENT: Negative for congestion and rhinorrhea.   Eyes: Positive for visual disturbance.  Respiratory: Negative for chest tightness and shortness of breath.   Cardiovascular: Negative for chest pain, palpitations  and leg swelling.  Gastrointestinal: Negative for abdominal distention and abdominal pain.  Endocrine: Negative for polyuria.  Genitourinary: Negative for difficulty urinating, dysuria and urgency.  Musculoskeletal: Negative for arthralgias and back pain.  Skin: Negative.   Neurological: Negative.   Psychiatric/Behavioral: Negative.      Objective:  BP 130/70 (BP Location: Left Arm, Patient Position: Sitting, Cuff Size: Normal)   Pulse 99   Temp (!) 97.2 F (36.2 C) (Temporal)   Ht 5\' 5"  (1.651 m)   Wt 242 lb (109.8 kg)   SpO2 99%   BMI 40.27 kg/m   BP/Weight 04/03/2021 02/05/2021 [id number]  Systolic BP [id number]  Diastolic BP 70 70 68  Wt. (Lbs) 242 242.4 240  BMI 40.27 40.34 39.94    Physical Exam Vitals reviewed.  Constitutional:  Appearance: Normal appearance. She is obese.  HENT:     Head: Normocephalic.     Right Ear: Tympanic membrane, ear canal and external ear normal.     Left Ear: Tympanic membrane and ear canal normal.     Mouth/Throat:     Mouth: Mucous membranes are moist.     Pharynx: Oropharynx is clear.  Eyes:     Extraocular Movements: Extraocular movements intact.     Conjunctiva/sclera: Conjunctivae normal.     Pupils: Pupils are equal, round, and reactive to light.  Cardiovascular:     Rate and Rhythm: Normal rate and regular rhythm.     Pulses: Normal pulses.     Heart sounds: Normal heart sounds. No murmur heard. No gallop.   Pulmonary:     Effort: Pulmonary effort is normal. No respiratory distress.     Breath sounds: Normal breath sounds. No rales.  Abdominal:     General: Abdomen is flat. Bowel sounds are normal.  Skin:    General: Skin is warm.     Capillary Refill: Capillary refill takes less than 2 seconds.  Neurological:     General: No focal deficit present.     Mental Status: She is alert and oriented to person, place, and time. Mental status is at baseline.  Psychiatric:        Mood and Affect: Mood normal.         Thought Content: Thought content normal.     Diabetic Foot Exam - Simple   Simple Foot Form Diabetic Foot exam was performed with the following findings: Yes 04/03/2021 11:32 AM  Visual Inspection No deformities, no ulcerations, no other skin breakdown bilaterally: Yes Sensation Testing Intact to touch and monofilament testing bilaterally: Yes Pulse Check Posterior Tibialis and Dorsalis pulse intact bilaterally: Yes Comments      Lab Results  Component Value Date   WBC 8.5 01/01/2021   HGB 17.1 (H) 01/01/2021   HCT 50.2 (H) 01/01/2021   PLT 275 01/01/2021   GLUCOSE 127 (H) 02/05/2021   CHOL 190 01/01/2021   TRIG 236 (H) 01/01/2021   HDL 47 01/01/2021   LDLCALC 103 (H) 01/01/2021   ALT 46 (H) 01/01/2021   AST 31 01/01/2021   NA 140 02/05/2021   K 4.3 02/05/2021   CL 97 (L) 02/05/2021   CREATININE 0.93 02/05/2021   BUN 18 02/05/2021   CO2 31 02/05/2021   TSH 2.880 07/17/2020   INR 1.0 04/04/2020   HGBA1C 6.1 10/25/2020      Assessment & Plan:   1. Chronic migraine without aura, with intractable migraine, so stated, with status migrainosus AN INDIVIDUAL CARE PLANfor migraines was established and reinforced today.  The patient's status was assessed using clinical findings on exam, labs, and other diagnostic testing. Patient's success at meeting treatment goals based on disease specific evidence-bassed guidelines and found to be in fair control. RECOMMENDATIONS include maintain present medicines and treatment.  2. Chronic systolic congestive heart failure [facility]) An individualized care plan was established and reinforced.  The patient's disease status was assessed using clinical finding son exam today, labs, and/or other diagnostic testing such as x-rays, to determine the patient's success in meeting treatmentgoalsbased on disease-based guidelines and found to beimproving. But not at goal yet. Medications prescriptions no changes Laboratory tests ordered to be performed  today include routine. RECOMMENDATIONS: given include see cardiology.  Call physician is patient gains 3 lbs in one day or 5 lbs for one week.  Call for progressive  PND, orthopnea or increased pedal edema.  3. Fibromuscular dysplasia ([place]) AN INDIVIDUAL CARE PLAN for fibromuscular dysplasia was established and reinforced today.  The patient's status was assessed using clinical findings on exam, labs, and other diagnostic testing. Patient's success at meeting treatment goals based on disease specific evidence-bassed guidelines and found to be in fair control. RECOMMENDATIONS include maintain present medicines and treatment.  4. Hypertensive heart disease with heart failure (HCC) - valsartan (DIOVAN) 40 MG tablet; Take 1 tablet (40 mg total) by mouth daily.  Dispense: 90 tablet; Refill: 3 An individual hypertension care plan was established and reinforced today.  The patient's status was assessed using clinical findings on exam and labs or diagnostic tests. The patient's success at meeting treatment goals on disease specific evidence-based guidelines and found to be fair controlled. SELF MANAGEMENT: The patient and I together assessed ways to personally work towards obtaining the recommended goals. RECOMMENDATIONS: avoid decongestants found in common cold remedies, decrease consumption of alcohol, perform routine monitoring of BP with home BP cuff, exercise, reduction of dietary salt, take medicines as prescribed, try not to miss doses and quit smoking.  Regular exercise and maintaining a healthy weight is needed.  Stress reduction may help. A CLINICAL SUMMARY including written plan identify barriers to care unique to individual due to social or financial issues.  We attempt to mutually creat solutions for individual and family understanding.  5. BMI 40.0-44.9, adult [facility]) An individualize plan was formulated for obesity using patient history and physical exam to encourage weight loss.  An evidence  based program was formulated.  Patient is to cut portion size with meals and to plan physical exercise 3 days a week at least 20 minutes.  Weight watchers and other programs are helpful.  Planned amount of weight loss 10 lbs.  6. Other hyperlipidemia AN INDIVIDUAL CARE PLAN for hyperlipidemia/ cholesterol was established and reinforced today.  The patient's status was assessed using clinical findings on exam, lab and other diagnostic tests. The patient's disease status was assessed based on evidence-based guidelines and found to be fair controlled. MEDICATIONS were reviewed. SELF MANAGEMENT GOALS have been discussed and patient's success at attaining the goal of low cholesterol was assessed. RECOMMENDATION given include regular exercise 3 days a week and low cholesterol/low fat diet. CLINICAL SUMMARY including written plan to identify barriers unique to the patient due to social or economic  reasons was discussed.  7. Morbid obesity ([place]) An individualize plan was formulated for obesity using patient history and physical exam to encourage weight loss.  An evidence based program was formulated.  Patient is to cut portion size with meals and to plan physical exercise 3 days a week at least 20 minutes.  Weight watchers and other programs are helpful.  Planned amount of weight loss 10 lbs.  8. Screening for colon cancer - Ambulatory referral to Gastroenterology  9. Encounter for screening mammogram for malignant neoplasm of breast - MM Digital Screening    Meds ordered this encounter  Medications  . valsartan (DIOVAN) 40 MG tablet    Sig: Take 1 tablet (40 mg total) by mouth daily.    Dispense:  90 tablet    Refill:  3    Orders Placed This Encounter  Procedures  . MM Digital Screening  . CBC with Differential/Platelet  . Comprehensive metabolic panel  . Lipid panel  . Ambulatory referral to Gastroenterology     Follow-up: Return in about 4 months (around 08/04/2021) for fasting.  An  After  Visit Summary was printed and given to the patient.  Reinaldo Meeker, MD [facility] [phone]

## 2021-04-04 ENCOUNTER — Other Ambulatory Visit: Payer: HMO

## 2021-04-05 NOTE — Progress Notes (Signed)
Remote ICD transmission.   

## 2021-04-10 NOTE — Addendum Note (Signed)
Addended by: Erie Noe on: 04/10/2021 09:35 AM   Modules accepted: Orders

## 2021-04-28 ENCOUNTER — Other Ambulatory Visit (HOSPITAL_COMMUNITY): Payer: Self-pay | Admitting: Internal Medicine

## 2021-04-28 ENCOUNTER — Other Ambulatory Visit: Payer: Self-pay | Admitting: Legal Medicine

## 2021-05-01 ENCOUNTER — Telehealth: Payer: Self-pay | Admitting: Legal Medicine

## 2021-05-01 NOTE — Progress Notes (Signed)
  Chronic Care Management   Note  05/01/2021 Name: Elizette Shek MRN: [id number] DOB: 04/18/1961  Jinger Middlesworth is a 60 y.o. year old female who is a primary care patient of Lillard Anes, MD. I reached out to [name] by phone today in response to a referral sent by Ms. Efraim Kaufmann PCP, Lillard Anes, MD.   Ms. Werner Lean was given information about Chronic Care Management services today including:  CCM service includes personalized support from designated clinical staff supervised by her physician, including individualized plan of care and coordination with other care providers 24/7 contact phone numbers for assistance for urgent and routine care needs. Service will only be billed when office clinical staff spend 20 minutes or more in a month to coordinate care. Only one practitioner may furnish and bill the service in a calendar month. The patient may stop CCM services at any time (effective at the end of the month) by phone call to the office staff.   Patient agreed to services and verbal consent obtained.   Follow up plan:   Tatjana Secretary/administrator

## 2021-05-07 ENCOUNTER — Other Ambulatory Visit: Payer: Self-pay

## 2021-05-07 ENCOUNTER — Encounter: Payer: Self-pay | Admitting: Cardiology

## 2021-05-07 ENCOUNTER — Ambulatory Visit (INDEPENDENT_AMBULATORY_CARE_PROVIDER_SITE_OTHER): Payer: HMO | Admitting: Cardiology

## 2021-05-07 VITALS — BP 115/75 | HR 71 | Ht 68.0 in | Wt 241.0 lb

## 2021-05-07 DIAGNOSIS — I255 Ischemic cardiomyopathy: Secondary | ICD-10-CM | POA: Diagnosis not present

## 2021-05-07 NOTE — Patient Instructions (Signed)
Medication Instructions:  Your physician recommends that you continue on your current medications as directed. Please refer to the Current Medication list given to you today.  *If you need a refill on your cardiac medications before your next appointment, please call your pharmacy*   Lab Work: None ordered If you have labs (blood work) drawn today and your tests are completely normal, you will receive your results only by: [place] (if you have MyChart) OR A paper copy in the mail If you have any lab test that is abnormal or we need to change your treatment, we will call you to review the results.   Testing/Procedures: None ordered   Follow-Up: At [facility], you and your health needs are our priority.  As part of our continuing mission to provide you with exceptional heart care, we have created designated Provider Care Teams.  These Care Teams include your primary Cardiologist (physician) and Advanced Practice Providers (APPs -  Physician Assistants and Nurse Practitioners) who all work together to provide you with the care you need, when you need it.  Remote monitoring is used to monitor your Pacemaker or ICD from home. This monitoring reduces the number of office visits required to check your device to one time per year. It allows [place] to keep an eye on the functioning of your device to ensure it is working properly. You are scheduled for a device check from home on 06/14/2021. You may send your transmission at any time that day. If you have a wireless device, the transmission will be sent automatically. After your physician reviews your transmission, you will receive a postcard with your next transmission date.  Your next appointment:   1 year(s)  The format for your next appointment:   In Person  Provider:   Allegra Lai, MD   Thank you for choosing [facility]!!   Trinidad Curet, RN [phone]    Other Instructions

## 2021-05-07 NOTE — Progress Notes (Signed)
Advanced [facility] Failure Clinic Note  Date:  05/08/2021   ID:  Allison White, DOB [birth year], MRN [id number]  Location: Home  Provider location: [facility] Type of Visit: Established patient  PCP:  Allison Anes, MD  Cardiologist:  None Primary HF: [place]  Chief Complaint: Heart Failure follow-up   History of Present Illness:  Allison White is a 60 y.o. female with morbid obesity, HTN, carotid artery dissection with stenting of left carotid due to possible FMD, CAD s/p anterior MI 1/18 (DES to LAD), systolic HF with EF 37-85% s/p Biotronik ICD who is referred by Dr. Bettina White for assistance with management of her HF.  Echo 8/20  EF 20-25% RV ok.   Seen in 5/21 with worsening NYHA IIIb symptoms. Had R/L cath with normal coronary arteries; widely patent LAD stent, EF 20-25%, Normal hemodynamics with CI 3.4. -> CPX  Echo 12/21 EF 25%  Today she returns for HF follow up. Last visit 3/22 struggling with orthostasis, compression hose added. Did not tolerate losartan, and she thinks valsartan is causing HA to increase, taking a lot of ibuprofen 2-4 tabs/day. SOB with stairs and yard work. Denies increasing SOB, CP, dizziness, edema, or PND/Orthopnea. Appetite ok. No fever or chills. Weight at home ~238 pounds. Taking all medications. Swimming for exercise.   Studies:  CPX 8/21 FVC 3.08 (91%)      FEV1 2.30 (87%%)        FEV1/FVC 75 (94%)        MVV 89 (94%)   RER 1.10 Peak VO2: 15.3 (90% predicted peak VO2) - adjusted to iBW 25.55ml/kg/min VE/VCO2 slope:  33   R/L cath 04/07/20 Ao = 102/56 (75) LV = 104/13 RA = 3 RV = 27/4 PA = 28/9 (17) PCW = 7 Fick cardiac output/index = 7.2/3.4 PVR = 1.4 WU FA sat = 99% PA sat = 79%, 81% High SVC sat = 81%  Monitor 10/19 1. Sinus rhythm 2. Rare PVCs and bigeminy (< 1.0%) 3. Two patient-triggered events associated with isolated PVCs.  4. No high-grade arrhythmias    Echo 06/2017 EF  20-25% Echo 02/2018 LVEF 25-30%, Trivial MR, Normal RV, Mild TR, PA peak pressure 26 mm Hg   CPX 03/17/18 FVC 3.00 (87%), FEV1 2.97(88%), FEV1/FVC 79 (99%)  Peak VO2: 13.8 - When adjusted to the patient's ideal body weight of 142.2 lb (64.5 kg) the peak VO2 is 22.6 ml/kg VE/VCO2 slope: 38 OUES: 1.52 Peak RER: 1.11 Ventilatory Threshold: 11.8 VE/MVV:  95% O2pulse:  8 Interpretation: Mild limitation due to HF and obesity   Past Medical History:  Diagnosis Date   CHF (congestive heart failure) ([place])    FMD (facioscapulohumeral muscular dystrophy) ([facility])    Headache(784.0)    Heart failure (HCC)    Hyperlipidemia    MI (myocardial infarction) ([place])    Migraine    Nonruptured cerebral aneurysm, internal carotid artery    Left side, stent placement (2009)   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 carotids    Vertigo    Past Surgical History:  Procedure Laterality Date   ABDOMINAL HYSTERECTOMY     CARDIAC DEFIBRILLATOR PLACEMENT     CEREBRAL ANEURYSM REPAIR Left 2009   CESAREAN SECTION     CORONARY ANGIOPLASTY WITH STENT PLACEMENT     PACEMAKER IMPLANT     RIGHT/LEFT HEART CATH AND CORONARY ANGIOGRAPHY N/A 04/07/2020   Procedure: RIGHT/LEFT HEART CATH AND CORONARY ANGIOGRAPHY;  Surgeon: Allison Artist, MD;  Location: [facility];  Service: Cardiovascular;  Laterality: N/A;   TUBAL LIGATION      Current Outpatient Medications  Medication Sig Dispense Refill   aspirin 81 MG EC tablet Take 81 mg by mouth daily.      atorvastatin (LIPITOR) 20 MG tablet Take 1 tablet by mouth once daily 90 tablet 1   dapagliflozin propanediol (FARXIGA) 10 MG TABS tablet Take 1 tablet (10 mg total) by mouth daily. 30 tablet 2   digoxin (LANOXIN) 0.125 MG tablet TAKE 1 TABLET BY MOUTH ONCE DAILY. APPT REQUIRED FOR FUTURE REFILLS 30 tablet 0   HYDROcodone-acetaminophen (NORCO) 7.5-325 MG tablet Take 1 tablet by mouth every 6 (six) hours as needed for moderate pain. 90 tablet 0   ivabradine (CORLANOR)  7.5 MG TABS tablet Take 1 tablet (7.5 mg total) by mouth 2 (two) times daily with a meal. 60 tablet 11   LORATADINE PO Take by mouth daily. [facility] brand     metoprolol succinate (TOPROL-XL) 50 MG 24 hr tablet Take 1 tablet by mouth twice daily 180 tablet 2   nitroGLYCERIN (NITROSTAT) 0.4 MG SL tablet Place 0.4 mg under the tongue every 5 (five) minutes as needed for chest pain.     spironolactone (ALDACTONE) 25 MG tablet Take 1 tablet (25 mg total) by mouth daily. 30 tablet 6   torsemide (DEMADEX) 100 MG tablet Take 50 mg by mouth as needed.     valsartan (DIOVAN) 40 MG tablet Take 1 tablet (40 mg total) by mouth daily. 90 tablet 3   No current facility-administered medications for this encounter.    Allergies:   Tape and Plavix [clopidogrel bisulfate]   Social History:  The patient  reports that she has quit smoking. Her smoking use included cigarettes. She has a 6.00 pack-year smoking history. She has never used smokeless tobacco. She reports current alcohol use. She reports that she does not use drugs.   Family History:  The patient's family history includes Cerebral aneurysm in her paternal grandmother; Dementia in her paternal grandfather; Hypertension in her brother; Prostate cancer in her father and paternal grandfather; Thyroid disease in her mother.   ROS:  Please see the history of present illness.   All other systems are personally reviewed and negative.   Body mass index is 36.61 kg/m.  Vitals:   05/08/21 1100  BP: 110/70  Pulse: 72  SpO2: 97%  Weight: 109.2 kg (240 lb 12.8 oz)   Recent Labs: 07/17/2020: TSH 2.880 01/01/2021: ALT 46; Hemoglobin 17.1; Platelets 275 02/05/2021: B Natriuretic Peptide 114.3; BUN 18; Creatinine, Ser 0.93; Potassium 4.3; Sodium 140   Wt Readings from Last 3 Encounters:  05/08/21 109.2 kg (240 lb 12.8 oz)  05/07/21 109.3 kg (241 lb)  04/03/21 109.8 kg (242 lb)    Exam:   General:  NAD. No resp difficulty HEENT: Normal Neck: Supple. No  JVD. Carotids 2+ bilat; no bruits. No lymphadenopathy or thryomegaly appreciated. Cor: PMI nondisplaced. Regular rate & rhythm. No rubs, gallops or murmurs. Lungs: Clear Abdomen: Obese, soft, nontender, nondistended. No hepatosplenomegaly. No bruits or masses. Good bowel sounds. Extremities: No cyanosis, clubbing, rash, trace BLE edema Neuro: alert & oriented x 3, cranial nerves grossly intact. Moves all 4 extremities w/o difficulty. Affect pleasant.  ECG: SR, a-paced 70 bpm (personally reviewed).  ASSESSMENT AND PLAN: 1. Chronic systolic HF, ICM - Echo 0/4540 LVEF 25-30%, Trivial MR, Normal RV, Mild TR, PA peak pressure 26 mm Hg - s/p Biotronik ICD - Echo  07/13/19 EF 20-25% - Cath 5/21 EF 20-25% normal coronaries. Well compensated hemodynamics with CI 3.4 - Echo 12/21 EF 25% - CPX 8/21 very reassuring - Peak VO2: 15.3 (90% predicted peak VO2) - adjusted to iBW 25.43ml/kg/min. VEVCO2 33  - NYHA II-early IIIb. Volume status looks good. REDS 34%   - Continue torsemide 50 mg prn.  - Continue compression hose to deal with orthostasis.  - Continue valsartan 40 mg daily (did not tolerate Entresto with orthostasis). - Spoke with PharmD, can consider switching valsartan to irbesartan if HA continue to worsen. - Continue spiro 25 mg daily.  - Continue Toprol XL 50 mg bid. - Continue digoxin 0.125 mg.  - Continue Farxiga 10 mg daily. - Decrease ivabradine to 5 mg bid. HR 70 today. - Needs to limit ibuprofen. - BMET & dig level today.  2. CAD s/p anterior MI 1/18 with DES to LAD in [place] - Cath 5/21 normal cors with patent LAD stent. - No s/s ischemia.   3. Obesity - Continue weight loss efforts. - Body mass index is 36.61 kg/m.   4. 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 - Has residual vertigo and dizziness. No changes.  5. Palpitations - Zio patch (10/19) was ok. Declined  repeating Zio. - We discussed getting AliveCor device if these persist.  - Agreeable to sleep study, will order.  Follow up in 3-4 months.   Frankey Poot, FNP  05/08/2021 11:09 AM  Advanced [facility] Failure [place] [address] [phone] (office) [phone] (fax)

## 2021-05-07 NOTE — Progress Notes (Signed)
Electrophysiology Office Note   Date:  05/07/2021   ID:  Allison White, DOB January 20, 1961, MRN [id number]  PCP:  Lillard Anes, MD  Cardiologist:  Bettina Gavia Primary Electrophysiologist:  Jomaira Darr Meredith Leeds, MD    No chief complaint on file.    History of Present Illness: Allison White is a 60 y.o. female who is being seen today for the evaluation of systolic heart failure at the request of Lillard Anes,*. Presenting today for electrophysiology evaluation.    She has a history of systolic heart failure, coronary artery disease, hyperlipidemia.  She has a Biotronik ICD in place.  She had an anterior STEMI 11/19/2016 and is status post drug-eluting stent to the LAD diagonal bifurcation.  Her ICD was implanted around that time.  Today, denies symptoms of palpitations, chest pain, shortness of breath, orthopnea, PND, lower extremity edema, claudication, dizziness, presyncope, syncope, bleeding, or neurologic sequela. The patient is tolerating medications without difficulties.  Doing well.  She has no restrictions.  Able do all of her daily activities.   Past Medical History:  Diagnosis Date   CHF (congestive heart failure) ([place])    FMD (facioscapulohumeral muscular dystrophy) ([place])    Headache(784.0)    Heart failure (H[facility])    Hyperlipidemia    MI (myocardial infarction) ([place])    Migraine    Nonruptured cerebral aneurysm, internal carotid artery    Left side, stent placement (2009)   Pseudoaneurysm ([place])    both carotids    Vertigo    Past Surgical History:  Procedure Laterality Date   ABDOMINAL HYSTERECTOMY     CARDIAC DEFIBRILLATOR PLACEMENT     CEREBRAL ANEURYSM REPAIR Left 2009   CESAREAN SECTION     CORONARY ANGIOPLASTY WITH STENT PLACEMENT     PACEMAKER IMPLANT     RIGHT/LEFT HEART CATH AND CORONARY ANGIOGRAPHY N/A 04/07/2020   Procedure: RIGHT/LEFT HEART CATH AND CORONARY ANGIOGRAPHY;  Surgeon: Jolaine Artist, MD;  Location: [facility];   Service: Cardiovascular;  Laterality: N/A;   TUBAL LIGATION       Current Outpatient Medications  Medication Sig Dispense Refill   aspirin 81 MG EC tablet Take 81 mg by mouth daily.      atorvastatin (LIPITOR) 20 MG tablet Take 1 tablet by mouth once daily 90 tablet 1   dapagliflozin propanediol (FARXIGA) 10 MG TABS tablet Take 1 tablet (10 mg total) by mouth daily. 30 tablet 2   digoxin (LANOXIN) 0.125 MG tablet TAKE 1 TABLET BY MOUTH ONCE DAILY. APPT REQUIRED FOR FUTURE REFILLS 30 tablet 0   HYDROcodone-acetaminophen (NORCO) 7.5-325 MG tablet Take 1 tablet by mouth every 6 (six) hours as needed for moderate pain. 90 tablet 0   ivabradine (CORLANOR) 7.5 MG TABS tablet Take 1 tablet (7.5 mg total) by mouth 2 (two) times daily with a meal. 60 tablet 11   LORATADINE PO Take by mouth daily. [facility] brand     metoprolol succinate (TOPROL-XL) 50 MG 24 hr tablet Take 1 tablet by mouth twice daily 180 tablet 2   nitroGLYCERIN (NITROSTAT) 0.4 MG SL tablet Place 0.4 mg under the tongue every 5 (five) minutes as needed for chest pain.     Rimegepant Sulfate (NURTEC) 75 MG TBDP Take 75 mg by mouth daily as needed. For migraines. Take as close to onset of migraine as possible. One daily maximum. 8 tablet 6   spironolactone (ALDACTONE) 25 MG tablet Take 1 tablet (25 mg total) by mouth daily. 30 tablet 6  torsemide (DEMADEX) 100 MG tablet Take 50 mg by mouth as needed.     valsartan (DIOVAN) 40 MG tablet Take 1 tablet (40 mg total) by mouth daily. 90 tablet 3   No current facility-administered medications for this visit.    Allergies:   Tape and Plavix [clopidogrel bisulfate]   Social History:  The patient  reports that she has quit smoking. Her smoking use included cigarettes. She has a 6.00 pack-year smoking history. She has never used smokeless tobacco. She reports current alcohol use. She reports that she does not use drugs.   Family History:  The patient's family history includes Cerebral  aneurysm in her paternal grandmother; Dementia in her paternal grandfather; Hypertension in her brother; Prostate cancer in her father and paternal grandfather; Thyroid disease in her mother.   ROS:  Please see the history of present illness.   Otherwise, review of systems is positive for none.   All other systems are reviewed and negative.   PHYSICAL EXAM: VS:  BP 115/75   Pulse 71   Ht 5\' 8"  (1.727 m)   Wt 241 lb (109.3 kg)   BMI 36.64 kg/m  , BMI Body mass index is 36.64 kg/m. GEN: Well nourished, well developed, in no acute distress  HEENT: normal  Neck: no JVD, carotid bruits, or masses Cardiac: RRR; no murmurs, rubs, or gallops,no edema  Respiratory:  clear to auscultation bilaterally, normal work of breathing GI: soft, nontender, nondistended, + BS MS: no deformity or atrophy  Skin: warm and dry, device site well healed Neuro:  Strength and sensation are intact Psych: euthymic mood, full affect  EKG:  EKG is ordered today. Personal review of the ekg ordered shows sinus rhythm, left anterior fascicular block, rate 71  Personal review of the device interrogation today. Results in [facility]: 07/17/2020: TSH 2.880 01/01/2021: ALT 46; Hemoglobin 17.1; Platelets 275 02/05/2021: B Natriuretic Peptide 114.3; BUN 18; Creatinine, Ser 0.93; Potassium 4.3; Sodium 140    Lipid Panel     Component Value Date/Time   CHOL 190 01/01/2021 1039   TRIG 236 (H) 01/01/2021 1039   HDL 47 01/01/2021 1039   CHOLHDL 4.0 01/01/2021 1039   CHOLHDL 4.1 06/11/2008 0825   VLDL 23 06/11/2008 0825   LDLCALC 103 (H) 01/01/2021 1039     Wt Readings from Last 3 Encounters:  05/07/21 241 lb (109.3 kg)  04/03/21 242 lb (109.8 kg)  02/05/21 242 lb 6.4 oz (110 kg)      Other studies Reviewed: Additional studies/ records that were reviewed today include: TTE 03/17/18 Review of the above records today demonstrates:  - Left ventricle: The cavity size was normal. Systolic function was    severely reduced. The estimated ejection fraction was in the   range of 25% to 30%. Diffuse hypokinesis. The study is not   technically sufficient to allow evaluation of LV diastolic   function. - Aortic valve: Transvalvular velocity was within the normal range.   There was no stenosis. There was no regurgitation. - Mitral valve: Transvalvular velocity was within the normal range.   There was no evidence for stenosis. There was trivial   regurgitation. - Right ventricle: The cavity size was normal. Wall thickness was   normal. Systolic function was normal. - Atrial septum: No defect or patent foramen ovale was identified   by color flow Doppler. - Tricuspid valve: There was mild regurgitation. - Pulmonary arteries: Systolic pressure was within the normal   range. PA peak  pressure: 26 mm Hg (S).   ASSESSMENT AND PLAN:  1.  Chronic systolic heart failure due to ischemic cardiomyopathy: Status post Biotronik ICD.  Currently on optimal medical therapy.  Device functioning appropriately.  No changes.  Continue Entresto, Aldactone, Toprol-XL.    2.  Hypertension: Currently well controlled  3.  Coronary artery disease: Currently feeling well without pain.  No changes.  4.  Hyperlipidemia: Continue atorvastatin  Current medicines are reviewed at length with the patient today.   The patient does not have concerns regarding her medicines.  The following changes were made today:  none  Labs/ tests ordered today include:  Orders Placed This Encounter  Procedures   EKG 12-Lead      Disposition:   FU with Hatem Cull 1 year  Signed, [place] Meredith Leeds, MD  05/07/2021 3:46 PM     [facility] [address] [place] [address] [phone] (office) [phone] (fax)

## 2021-05-08 ENCOUNTER — Ambulatory Visit (HOSPITAL_COMMUNITY)
Admission: RE | Admit: 2021-05-08 | Discharge: 2021-05-08 | Disposition: A | Discharge status: Home / Self Care | Payer: HMO | Source: Ambulatory Visit | Attending: Family Medicine | Admitting: Family Medicine

## 2021-05-08 ENCOUNTER — Encounter (HOSPITAL_COMMUNITY): Payer: Self-pay

## 2021-05-08 VITALS — BP 110/70 | HR 72 | Wt 240.8 lb

## 2021-05-08 DIAGNOSIS — R002 Palpitations: Secondary | ICD-10-CM | POA: Diagnosis not present

## 2021-05-08 DIAGNOSIS — Z7984 Long term (current) use of oral hypoglycemic drugs: Secondary | ICD-10-CM | POA: Diagnosis not present

## 2021-05-08 DIAGNOSIS — I110 Hypertensive heart disease with heart failure: Secondary | ICD-10-CM | POA: Insufficient documentation

## 2021-05-08 DIAGNOSIS — R0602 Shortness of breath: Secondary | ICD-10-CM | POA: Diagnosis not present

## 2021-05-08 DIAGNOSIS — I25119 Atherosclerotic heart disease of native coronary artery with unspecified angina pectoris: Secondary | ICD-10-CM | POA: Diagnosis not present

## 2021-05-08 DIAGNOSIS — R400 Somnolence: Secondary | ICD-10-CM | POA: Diagnosis not present

## 2021-05-08 DIAGNOSIS — Z8249 Family history of ischemic heart disease and other diseases of the circulatory system: Secondary | ICD-10-CM | POA: Insufficient documentation

## 2021-05-08 DIAGNOSIS — I252 Old myocardial infarction: Secondary | ICD-10-CM | POA: Diagnosis not present

## 2021-05-08 DIAGNOSIS — I2510 Atherosclerotic heart disease of native coronary artery without angina pectoris: Secondary | ICD-10-CM | POA: Insufficient documentation

## 2021-05-08 DIAGNOSIS — Z87891 Personal history of nicotine dependence: Secondary | ICD-10-CM | POA: Insufficient documentation

## 2021-05-08 DIAGNOSIS — Z6836 Body mass index (BMI) 36.0-36.9, adult: Secondary | ICD-10-CM | POA: Diagnosis not present

## 2021-05-08 DIAGNOSIS — Z955 Presence of coronary angioplasty implant and graft: Secondary | ICD-10-CM | POA: Insufficient documentation

## 2021-05-08 DIAGNOSIS — Z79899 Other long term (current) drug therapy: Secondary | ICD-10-CM | POA: Diagnosis not present

## 2021-05-08 DIAGNOSIS — Z7901 Long term (current) use of anticoagulants: Secondary | ICD-10-CM | POA: Insufficient documentation

## 2021-05-08 DIAGNOSIS — Z888 Allergy status to other drugs, medicaments and biological substances status: Secondary | ICD-10-CM | POA: Diagnosis not present

## 2021-05-08 DIAGNOSIS — I5022 Chronic systolic (congestive) heart failure: Secondary | ICD-10-CM | POA: Diagnosis not present

## 2021-05-08 DIAGNOSIS — Z7982 Long term (current) use of aspirin: Secondary | ICD-10-CM | POA: Diagnosis not present

## 2021-05-08 LAB — BASIC METABOLIC PANEL
Anion gap: 10 (ref 5–15)
BUN: 16 mg/dL (ref 6–20)
CO2: 34 mmol/L — ABNORMAL HIGH (ref 22–32)
Calcium: 9.8 mg/dL (ref 8.9–10.3)
Chloride: 93 mmol/L — ABNORMAL LOW (ref 98–111)
Creatinine, Ser: 0.89 mg/dL (ref 0.44–1.00)
GFR, Estimated: >60 mL/min (ref >60)
Glucose, Bld: 121 mg/dL — ABNORMAL HIGH (ref 70–99)
Potassium: 3.7 mmol/L (ref 3.5–5.1)
Sodium: 137 mmol/L (ref 135–145)

## 2021-05-08 LAB — DIGOXIN LEVEL: Digoxin Level: 0.5 ng/mL — ABNORMAL LOW (ref 0.8–2.0)

## 2021-05-08 MED ORDER — IVABRADINE HCL 5 MG PO TABS: 5.0000 mg | ORAL_TABLET | Freq: Two times a day (BID) | ORAL | 3 refills | Status: DC

## 2021-05-08 NOTE — Patient Instructions (Signed)
DECREASE Corlanor to 5 mg, one tab twice a day   Labs today We will only contact you if something comes back abnormal or we need to make some changes. Otherwise no news is good news!  Your physician has recommended that you have a sleep study. This test records several body functions during sleep, including: brain activity, eye movement, oxygen and carbon dioxide blood levels, heart rate and rhythm, breathing rate and rhythm, the flow of air through your mouth and nose, snoring, body muscle movements, and chest and belly movement.  Your physician recommends that you schedule a follow-up appointment in: 3-4 months with Dr Haroldine Laws   Do the following things EVERYDAY: Weigh yourself in the morning before breakfast. Write it down and keep it in a log. Take your medicines as prescribed Eat low salt foods--Limit salt (sodium) to 2000 mg per day.  Stay as active as you can everyday Limit all fluids for the day to less than 2 liters  At the [facility], you and your health needs are our priority. As part of our continuing mission to provide you with exceptional heart care, we have created designated Provider Care Teams. These Care Teams include your primary Cardiologist (physician) and Advanced Practice Providers (APPs- Physician Assistants and Nurse Practitioners) who all work together to provide you with the care you need, when you need it.   You may see any of the following providers on your designated Care Team at your next follow up: Dr Glori Bickers Dr Loralie Champagne Dr Patrice Paradise, NP Lyda Jester, [place] Ginnie Smart Audry Riles, PharmD   Please be sure to bring in all your medications bottles to every appointment.   If you have any questions or concerns before your next appointment please send [place] a message through [facility] or call our office at [phone].    TO LEAVE A MESSAGE FOR THE NURSE SELECT OPTION 2, PLEASE LEAVE A MESSAGE  INCLUDING: YOUR NAME DATE OF BIRTH CALL BACK NUMBER REASON FOR CALL**this is important as we prioritize the call backs  YOU WILL RECEIVE A CALL BACK THE SAME DAY AS LONG AS YOU CALL BEFORE 4:00 PM

## 2021-05-08 NOTE — Progress Notes (Signed)
Patient Name: Allison White         DOB: [birth year]      Height: 5'5"    Weight:240lb  Office Name:[facility]         Referring Provider: Allena Katz, NP/ Glori Bickers, MD  Today's Date: 05/08/2021  STOP BANG RISK ASSESSMENT S (snore) Have you been told that you snore?     YES   T (tired) Are you often tired, fatigued, or sleepy during the day?   YES  O (obstruction) Do you stop breathing, choke, or gasp during sleep? YES   P (pressure) Do you have or are you being treated for high blood pressure? YES   B (BMI) Is your body index greater than 35 kg/m? YES   A (age) Are you 59 years old or older? YES   N (neck) Do you have a neck circumference greater than 16 inches?   NO   G (gender) Are you a female? NO   TOTAL STOP/BANG "YES" ANSWERS                                                                        For Office Use Only              Procedure Order Form    YES to 3+ Stop Bang questions OR two clinical symptoms - patient qualifies for WatchPAT (CPT 95800)             Clinical Notes: Will consult Sleep Specialist and refer for management of therapy due to patient increased risk of Sleep Apnea. Ordering a sleep study due to the following two clinical symptoms: Excessive daytime sleepiness G47.10 Loud snoring R06.83 Unrefreshed by sleep G47.8 History of high blood pressure R03.0     I understand that I am proceeding with a home sleep apnea test as ordered by my treating physician. I understand that untreated sleep apnea is a serious cardiovascular risk factor and it is my responsibility to perform the test and seek management for sleep apnea. I will be contacted with the results and be managed for sleep apnea by a local sleep physician. I will be receiving equipment and further instructions from [facility]. I shall promptly ship back the equipment via the included mailing label. I understand my insurance will be billed for the test and as the  patient I am responsible for any insurance related out-of-pocket costs incurred. I have been provided with written instructions and can call for additional video or telephonic instruction, with 24-hour availability of qualified personnel to answer any questions: Patient Help Desk [phone].  Patient Signature ______________________________________________________   Date______________________ Patient Telemedicine Verbal Consent

## 2021-05-08 NOTE — Progress Notes (Signed)
ReDS Vest / Clip - 05/08/21 1100       ReDS Vest / Clip   Station Marker A    Ruler Value 31    ReDS Value Range Low volume    ReDS Actual Value 34

## 2021-05-10 ENCOUNTER — Encounter (HOSPITAL_BASED_OUTPATIENT_CLINIC_OR_DEPARTMENT_OTHER): Payer: HMO | Admitting: Cardiology

## 2021-05-10 DIAGNOSIS — I5022 Chronic systolic (congestive) heart failure: Secondary | ICD-10-CM | POA: Diagnosis not present

## 2021-05-10 DIAGNOSIS — I420 Dilated cardiomyopathy: Secondary | ICD-10-CM | POA: Diagnosis not present

## 2021-05-10 DIAGNOSIS — Z6841 Body Mass Index (BMI) 40.0 and over, adult: Secondary | ICD-10-CM

## 2021-05-10 DIAGNOSIS — I110 Hypertensive heart disease with heart failure: Secondary | ICD-10-CM | POA: Diagnosis not present

## 2021-05-14 ENCOUNTER — Encounter: Payer: Self-pay | Admitting: Family Medicine

## 2021-05-14 ENCOUNTER — Ambulatory Visit: Payer: HMO | Admitting: Family Medicine

## 2021-05-14 ENCOUNTER — Ambulatory Visit
Admission: RE | Admit: 2021-05-14 | Discharge: 2021-05-14 | Disposition: A | Discharge status: Home / Self Care | Payer: HMO | Source: Ambulatory Visit | Attending: Legal Medicine | Admitting: Legal Medicine

## 2021-05-14 ENCOUNTER — Other Ambulatory Visit: Payer: Self-pay

## 2021-05-14 VITALS — BP 94/62 | HR 68 | Ht 65.0 in | Wt 239.4 lb

## 2021-05-14 DIAGNOSIS — Z1231 Encounter for screening mammogram for malignant neoplasm of breast: Secondary | ICD-10-CM

## 2021-05-14 DIAGNOSIS — G43711 Chronic migraine without aura, intractable, with status migrainosus: Secondary | ICD-10-CM

## 2021-05-14 MED ORDER — SPIRONOLACTONE 25 MG PO TABS: 25.0000 mg | ORAL_TABLET | Freq: Every day | ORAL | 6 refills | Status: DC

## 2021-05-14 MED ORDER — AJOVY 225 MG/1.5ML ~~LOC~~ SOAJ: 225.0000 mg | SUBCUTANEOUS | 0 refills | Status: DC

## 2021-05-14 MED ORDER — AJOVY 225 MG/1.5ML ~~LOC~~ SOAJ: 225.0000 mg | SUBCUTANEOUS | 3 refills | Status: DC

## 2021-05-14 NOTE — Progress Notes (Addendum)
Chief Complaint  Patient presents with   Follow-up    RM 1, alone. Here for migraine f/u, pt reports her migraines have worsen due to change in BP medication. Pt is now having constant sharp frontal HA, causing it to aggravate her other HA.      HISTORY OF PRESENT ILLNESS: 05/14/21 ALL:  Allison White is a 60 y.o. female here today for follow up for migraines. She was started on [place] at consult with Dr Jaynee Eagles 12/2020. Emgality seemed to be somewhat effective the first month. She does not feel it has been as effective, recently, and is too expensive.  Roselyn Meier did not help much at all with multiple doses. Nurtec was too expensive. She reports that her blood pressure was low on Entresto and cardiology has been adjusting medications. She is now on valsartan. She is having daily headaches. She has throbbing pain constantly in the left occipital region. Some sharp stabbing pains, recently. She feels changes in cardiology medicaitons are trigger. Cardiology has asked her not to use Nsaids. Aspirin upsets her stomach. She is just taking hydrocodone for chronic pain management. It is the only thing that helps with migraine abortion.   Patient has tried and failed: Preventative: Topiramate (abdominal pain and diarrhea), zonisamide, labetalol, metoprolol, lisinopril, amitriptyline, duloxetine, gabapentin, Emgality (on now), Botox (worsened headaches), verapamil   Abortive: triptan contraindicated, Nurtec, Ubrelvy (ineffective), ibuprofen, Aleve, Tylenol, Aspirin, muscle relaxers, Valium, ondansetron, hydrocodone   HISTORY (copied from Dr Cathren Laine previous note)  HPI:  Allison White is a 60 y.o. female here as requested by Lillard Anes,* for intractable headaches and intractable migraines. PMHx intractable migraine, chronic daily headache, chronic pain, obesity, vertigo, aneurysm ICA s/p coronary angioplasty with left tandem stents, cerebral fibromuscular dysplasia (saw  [facility] who did not think imaging was strongly suggestive), migraine, HLD,cardiomyopathy and heart failure s/p pacemaker and defibrillator.  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  She underwent stent placement for the left internal carotid artery pseudoaneurysm and then a second stent in 2010 in the left.  Due to fibromuscular dysplasia on her initial angiogram, she had been to the [facility] and saw Dr. Gita Kudo who did not feel her imaging was strongly suggestive of FMD.  She has had chronic headaches since then and has been evaluated at multiple clinics including pain clinics.  She has had 0 headaches free in a month since 2009.  Currently she does take Vicodin daily as needed for pain relief.   She is here with her husband who also provides much information and states she had a dissection of her carotid artery and deveshwar put in a stent, the headaches started at the onset of her dissection. Worse with pressure changes. Worse moving her head. Always comes from the back of the head, throbbing, shoots to behind the eyes, The right started first, then the left started hurting. Massage would help. There all the time, the throbbing is always a 2-3/10 if she moves her head too much, hurts badly when she lays down. She has had light and sound sensitivity, can start with a lightning bolt in the right eye, she gets vertigo badly on the left, she has clogged ears on the left, 15 migrainous days can last 24 hours, throbbing/pulksating/pounding, light sensitivity, nausea when it gets very bad,  movement makes it worse. Botox made it worse. Botox was a failure. Ongoing at this frequency and severity for years since 2009. Has tried multiplemedications. No other focal neurologic deficits,  associated symptoms, inciting events or modifiable factors.    Reviewed notes, labs and imaging from outside physicians, which showed:   From a thorough review of records, medications tried that can be used in migraine management include: Aspirin, Tylenol, hydrocodone, labetalol, lisinopril, metoprolol, Zofran, verapamil, Zoloft, Topamax(abdomoinal pain and diarrhea), Neupro dependent, Zonegran, botox, amitriptyline, Vicodin, Valium, gabapentin, Cymbalta, vestibular therapy, headache supplements   REVIEW OF SYSTEMS: Out of a complete 14 system review of symptoms, the patient complains only of the following symptoms,headaches, numbness, chronic pain and all other reviewed systems are negative.   ALLERGIES: Allergies  Allergen Reactions   Tape Rash    PREFERS CLOTH OR NOTHING   Plavix [Clopidogrel Bisulfate] Other (See Comments)    Joint pain     HOME MEDICATIONS: Outpatient Medications Prior to Visit  Medication Sig Dispense Refill   aspirin 81 MG EC tablet Take 81 mg by mouth daily.      atorvastatin (LIPITOR) 20 MG tablet Take 1 tablet by mouth once daily 90 tablet 1   dapagliflozin propanediol (FARXIGA) 10 MG TABS tablet Take 1 tablet (10 mg total) by mouth daily. 30 tablet 2   digoxin (LANOXIN) 0.125 MG tablet TAKE 1 TABLET BY MOUTH ONCE DAILY. APPT REQUIRED FOR FUTURE REFILLS 30 tablet 0   HYDROcodone-acetaminophen (NORCO) 7.5-325 MG tablet Take 1 tablet by mouth every 6 (six) hours as needed for moderate pain. 90 tablet 0   ivabradine (CORLANOR) 5 MG TABS tablet Take 1 tablet (5 mg total) by mouth 2 (two) times daily with a meal. 180 tablet 3   LORATADINE PO Take by mouth daily. [facility] brand     metoprolol succinate (TOPROL-XL) 50 MG 24 hr tablet Take 1 tablet by mouth twice daily 180 tablet 2   nitroGLYCERIN (NITROSTAT) 0.4 MG SL tablet Place 0.4 mg under the tongue every 5 (five) minutes as needed for chest pain.     spironolactone (ALDACTONE) 25 MG tablet Take 1 tablet (25  mg total) by mouth daily. 30 tablet 6   torsemide (DEMADEX) 100 MG tablet Take 50 mg by mouth daily.     valsartan (DIOVAN) 40 MG tablet Take 1 tablet (40 mg total) by mouth daily. 90 tablet 3   No facility-administered medications prior to visit.     PAST MEDICAL HISTORY: Past Medical History:  Diagnosis Date   CHF (congestive heart failure) ([place])    FMD (facioscapulohumeral muscular dystrophy) ([place])    Headache(784.0)    Heart failure (H[facility])    Hyperlipidemia    MI (myocardial infarction) ([place])    Migraine    Nonruptured cerebral aneurysm, internal carotid artery    Left side, stent placement (2009)   Pseudoaneurysm ([place])    both carotids    Vertigo      PAST SURGICAL HISTORY: Past Surgical History:  Procedure Laterality Date   ABDOMINAL HYSTERECTOMY     CARDIAC DEFIBRILLATOR PLACEMENT     CEREBRAL ANEURYSM REPAIR Left 2009   CESAREAN SECTION     CORONARY ANGIOPLASTY WITH STENT PLACEMENT     PACEMAKER IMPLANT     RIGHT/LEFT HEART CATH AND CORONARY ANGIOGRAPHY N/A 04/07/2020   Procedure: RIGHT/LEFT HEART CATH AND CORONARY ANGIOGRAPHY;  Surgeon: Jolaine Artist, MD;  Location: [facility];  Service: Cardiovascular;  Laterality: N/A;   TUBAL LIGATION  FAMILY HISTORY: Family History  Problem Relation Age of Onset   Thyroid disease Mother    Prostate cancer Father        Prostate   Hypertension Brother    Cerebral aneurysm Paternal Grandmother        Nonruptured   Dementia Paternal Grandfather    Prostate cancer Paternal Grandfather      SOCIAL HISTORY: Social History   Socioeconomic History   Marital status: Married    Spouse name: Not on file   Number of children: 4   Years of education: Not on file   Highest education level: High school graduate  Occupational History   Not on file  Tobacco Use   Smoking status: Former    Packs/day: 1.00    Years: 6.00    Pack years: 6.00    Types: Cigarettes   Smokeless tobacco: Never   Tobacco  comments:    Quit over 30 years ago  Vaping Use   Vaping Use: Never used  Substance and Sexual Activity   Alcohol use: Yes    Comment: Occasionally "little"   Drug use: No   Sexual activity: Not Currently  Other Topics Concern   Not on file  Social History Narrative   Lives with husband in a one-story home.     Right handed   Caffeine: "very little"   Social Determinants of Health   Financial Resource Strain: Not on file  Food Insecurity: Not on file  Transportation Needs: Not on file  Physical Activity: Not on file  Stress: Not on file  Social Connections: Moderately Integrated   Frequency of Communication with Friends and Family: More than three times a week   Frequency of Social Gatherings with Friends and Family: Twice a week   Attends Religious Services: 1 to 4 times per year   Active Member of [employer] or Organizations: No   Attends Archivist Meetings: Never   Marital Status: Married  Human resources officer Violence: Not At Risk   Fear of Current or Ex-Partner: No   Emotionally Abused: No   Physically Abused: No   Sexually Abused: No     PHYSICAL EXAM  Vitals:   05/14/21 0955  BP: 94/62  Pulse: 68  Weight: 239 lb 6.4 oz (108.6 kg)  Height: 5\' 5"  (1.651 m)   Body mass index is 39.84 kg/m.   Generalized: Well developed, in no acute distress  Cardiology: normal rate and rhythm, no murmur auscultated  Respiratory: clear to auscultation bilaterally    Neurological examination  Mentation: Alert oriented to time, place, history taking. Follows all commands speech and language fluent Cranial nerve II-XII: Pupils were equal round reactive to light. Extraocular movements were full, visual field were full on confrontational test. Facial sensation and strength were normal. Head turning and shoulder shrug  were normal and symmetric. Motor: The motor testing reveals 5 over 5 strength of all 4 extremities. Good symmetric motor tone is noted throughout.  Gait and  station: Gait is normal.    DIAGNOSTIC DATA (LABS, IMAGING, TESTING) - I reviewed patient records, labs, notes, testing and imaging myself where available.  Lab Results  Component Value Date   WBC 8.5 01/01/2021   HGB 17.1 (H) 01/01/2021   HCT 50.2 (H) 01/01/2021   MCV 93 01/01/2021   PLT 275 01/01/2021      Component Value Date/Time   NA 137 05/08/2021 1148   NA 138 01/01/2021 1039   K 3.7 05/08/2021 1148   CL 93 (  L) 05/08/2021 1148   CO2 34 (H) 05/08/2021 1148   GLUCOSE 121 (H) 05/08/2021 1148   BUN 16 05/08/2021 1148   BUN 17 01/01/2021 1039   CREATININE 0.89 05/08/2021 1148   CALCIUM 9.8 05/08/2021 1148   PROT 7.6 01/01/2021 1039   ALBUMIN 4.7 01/01/2021 1039   AST 31 01/01/2021 1039   ALT 46 (H) 01/01/2021 1039   ALKPHOS 126 (H) 01/01/2021 1039   BILITOT 0.8 01/01/2021 1039   GFRNONAA >60 05/08/2021 1148   GFRAA 70 01/01/2021 1039   Lab Results  Component Value Date   CHOL 190 01/01/2021   HDL 47 01/01/2021   LDLCALC 103 (H) 01/01/2021   TRIG 236 (H) 01/01/2021   CHOLHDL 4.0 01/01/2021   Lab Results  Component Value Date   HGBA1C 6.1 10/25/2020   No results found for: [id number] Lab Results  Component Value Date   TSH 2.880 07/17/2020    No flowsheet data found.   No flowsheet data found.   ASSESSMENT AND PLAN  60 y.o. year old female  has a past medical history of CHF (congestive heart failure) ([place]), FMD (facioscapulohumeral muscular dystrophy) ([place]), Headache(784.0), Heart failure ([place]), Hyperlipidemia, MI (myocardial infarction) ([place]), Migraine, Nonruptured cerebral aneurysm, internal carotid artery, Pseudoaneurysm ([place]), and Vertigo. here with    Chronic migraine without aura, with intractable migraine, so stated, with status migrainosus  Ceili does not feel Emgality has helped enough to warrant cost. She was unable to afford copay with Nurtec and Ubrelvy not effective. Triptans contraindicated due to cardiac and vascular history. She has  tried and failed multiple preventative and abortive therapies. We will discontinue Emgality and start Ajovy. First injection performed in the office, today. She is aware of potential side effects and appropriate storage/administration. Amovig contraindicated due to cardiac history. She will continue close follow up with PCP and cardiology. She may reach out to Nurtec to inquire about patient assistance. No samples available, today. She will follow up with me in 3-4 months. She verbalizes understanding and agreement with this plan.    No orders of the defined types were placed in this encounter.    Meds ordered this encounter  Medications   Fremanezumab-vfrm (AJOVY) 225 MG/1.5ML SOAJ    Sig: Inject 225 mg into the skin every 30 (thirty) days.    Dispense:  4.5 mL    Refill:  3    Order Specific Question:   Supervising Provider    Answer:   Melvenia Beam [[id number]]   Fremanezumab-vfrm (AJOVY) 225 MG/1.5ML SOAJ    Sig: Inject 225 mg into the skin every 30 (thirty) days.    Dispense:  2 mL    Refill:  0    Order Specific Question:   Supervising Provider    Answer:   Melvenia Beam [[id number]]    Order Specific Question:   Lot Number?    Answer:   [id number]    Order Specific Question:   Expiration Date?    Answer:   05/18/2021       Debbora Presto, MSN, FNP-C 05/14/2021, 10:49 AM  [facility] [address] [place], [place] [address] [phone]  agree with assessment and plan as stated.     Sarina Ill, MD [facility]

## 2021-05-14 NOTE — Patient Instructions (Addendum)
Below is our plan:  We will discontinue Emgality. Start Ajovy injections every 30 days. First injection was given in the office, today. Try calling [employer]ur[employer] and/or Ajovy to see if you may qualify for patient assistance. Continue working with PCP and cardiology for co morbidity management.   Please make sure you are staying well hydrated. I recommend 50-60 ounces daily. Well balanced diet and regular exercise encouraged. Consistent sleep schedule with 6-8 hours recommended.   Please continue follow up with care team as directed.   Follow up with me in 3-4 months   You may receive a survey regarding today's visit. I encourage you to leave honest feed back as I do use this information to improve patient care. Thank you for seeing me today!

## 2021-05-16 ENCOUNTER — Ambulatory Visit: Payer: HMO

## 2021-05-16 DIAGNOSIS — R400 Somnolence: Secondary | ICD-10-CM

## 2021-05-16 DIAGNOSIS — I5022 Chronic systolic (congestive) heart failure: Secondary | ICD-10-CM

## 2021-05-16 NOTE — Progress Notes (Signed)
Mammogram- fibroglandular tissue , needs diagnostic mammogram and ultrasound lp

## 2021-05-16 NOTE — Procedures (Signed)
   Sleep Study Report  Patient Information Study Date: May 10, 2021 Patient Name: Allison White Patient ID: [id number] Birth Date: 06-02-1961 Age: 60 Gender: Female Referring Physician: Pierre Bali, MD  TEST DESCRIPTION: Home sleep apnea testing was completed using the WatchPat, a Type 1 device, utilizing  peripheral arterial tonometry (PAT), chest movement, actigraphy, pulse oximetry, pulse rate, body position and snore.  AHI was calculated with apnea and hypopnea using valid sleep time as the denominator. RDI includes apneas,  hypopneas, and RERAs. The data acquired and the scoring of sleep and all associated events were performed in  accordance with the recommended standards and specifications as outlined in the AASM Manual for the Scoring of  Sleep and Associated Events 2.2.0 (2015).   FINDINGS: 1. No evidence of Obstructive Sleep Apnea with AHI 4.6/hr.  2. No Central Sleep Apnea. 3. Oxygen desaturations as low as 85%. 4. Mild snoring was present. O2 sats were < 88% for 3.26minutes. 5. Total sleep time was 8 hrs and 53 min. 6. 21.7% of total sleep time was spent in REM sleep.  7. Normal sleep onset latency at 22 min.  8. Normal REM sleep onset latency at 83 min.  9. Total awakenings were 7.   DIAGNOSIS:  Normal study with no significant sleep disordered breathing.  RECOMMENDATIONS:  1. Normal study with no significant sleep disordered breathing.  2. Healthy sleep recommendations include: adequate nightly sleep (normal 7-9 hrs/night), avoidance of caffeine after  noon and alcohol near bedtime, and maintaining a sleep environment that is cool, dark and quiet.  3. Weight loss for overweight patients is recommended.   4. Snoring recommendations include: weight loss where appropriate, side sleeping, and avoidance of alcohol before  bed.  5. Operation of motor vehicle or dangerous equipment must be avoided when feeling drowsy, excessively sleepy, or  mentally fatigued.    6. An ENT consultation which may be useful for specific causes of and possible treatment of bothersome snoring .   7. Weight loss may be of benefit in reducing the severity of snoring.   Signature: Electronically Signed: May 16, 2021 Fransico Him, MD; [facility]; [place], American Board of  Sleep Medicine Report prepared by: Fransico Him, MD

## 2021-05-17 ENCOUNTER — Other Ambulatory Visit: Payer: Self-pay | Admitting: Legal Medicine

## 2021-05-17 DIAGNOSIS — R928 Other abnormal and inconclusive findings on diagnostic imaging of breast: Secondary | ICD-10-CM

## 2021-05-18 ENCOUNTER — Other Ambulatory Visit: Payer: Self-pay | Admitting: Legal Medicine

## 2021-05-22 ENCOUNTER — Telehealth: Payer: Self-pay

## 2021-05-22 ENCOUNTER — Encounter: Payer: Self-pay | Admitting: Legal Medicine

## 2021-05-22 ENCOUNTER — Other Ambulatory Visit: Payer: Self-pay | Admitting: Legal Medicine

## 2021-05-22 ENCOUNTER — Telehealth: Payer: Self-pay | Admitting: *Deleted

## 2021-05-22 DIAGNOSIS — G894 Chronic pain syndrome: Secondary | ICD-10-CM

## 2021-05-22 DIAGNOSIS — I10 Essential (primary) hypertension: Secondary | ICD-10-CM

## 2021-05-22 DIAGNOSIS — R400 Somnolence: Secondary | ICD-10-CM

## 2021-05-22 NOTE — Telephone Encounter (Signed)
The patient has been notified of the result and verbalized understanding.  All questions (if any) were answered. Marolyn Hammock, [place] 05/22/2021 11:02 AM   LMTCB with husband Tim.

## 2021-05-22 NOTE — Telephone Encounter (Signed)
-----   Message from Sueanne Margarita, MD sent at 05/16/2021 10:38 PM EDT ----- Normal home sleep study so in lab PSG will be ordered

## 2021-05-22 NOTE — Telephone Encounter (Signed)
I let the patient know her monitor has not communicated with her in 21 days. She states sometimes she see the open book icon and she hit the button and it says okay. I gave her the number to [employer] tech support to get additional help. I gave her the device clinic number as well to call us back to let us know what tech support says.

## 2021-05-22 NOTE — Telephone Encounter (Signed)
The patient called tech support and they are sending her a new monitor because hers is 3g and no longer working. She should receive it in a few days.

## 2021-05-23 ENCOUNTER — Other Ambulatory Visit: Payer: Self-pay

## 2021-05-23 DIAGNOSIS — G894 Chronic pain syndrome: Secondary | ICD-10-CM

## 2021-05-23 MED ORDER — HYDROCODONE-ACETAMINOPHEN 7.5-325 MG PO TABS: 1.0000 | ORAL_TABLET | Freq: Four times a day (QID) | ORAL | 0 refills | Status: DC | PRN

## 2021-05-28 NOTE — Telephone Encounter (Signed)
`  Prior Authorization for NPSG sent to [facility] via Fax [phone].

## 2021-05-28 NOTE — Addendum Note (Signed)
Addended by: Freada Bergeron on: 05/28/2021 01:25 PM   Modules accepted: Orders

## 2021-06-01 ENCOUNTER — Encounter (HOSPITAL_COMMUNITY): Payer: Self-pay

## 2021-06-07 ENCOUNTER — Ambulatory Visit
Admission: RE | Admit: 2021-06-07 | Discharge: 2021-06-07 | Disposition: A | Discharge status: Home / Self Care | Payer: HMO | Source: Ambulatory Visit | Attending: Legal Medicine | Admitting: Legal Medicine

## 2021-06-07 ENCOUNTER — Other Ambulatory Visit: Payer: Self-pay

## 2021-06-07 ENCOUNTER — Other Ambulatory Visit: Payer: Self-pay | Admitting: Legal Medicine

## 2021-06-07 DIAGNOSIS — R928 Other abnormal and inconclusive findings on diagnostic imaging of breast: Secondary | ICD-10-CM

## 2021-06-07 DIAGNOSIS — R922 Inconclusive mammogram: Secondary | ICD-10-CM | POA: Diagnosis not present

## 2021-06-07 NOTE — Progress Notes (Signed)
Needs needle biopsy lp

## 2021-06-12 ENCOUNTER — Encounter (HOSPITAL_COMMUNITY): Payer: Self-pay

## 2021-06-14 ENCOUNTER — Ambulatory Visit (INDEPENDENT_AMBULATORY_CARE_PROVIDER_SITE_OTHER): Payer: HMO

## 2021-06-14 DIAGNOSIS — I5022 Chronic systolic (congestive) heart failure: Secondary | ICD-10-CM

## 2021-06-15 LAB — CUP PACEART REMOTE DEVICE CHECK
Date Time Interrogation Session: 20220727100945
Implantable Lead Implant Date: 20180410
Implantable Lead Implant Date: 20180410
Implantable Lead Location: 753859
Implantable Lead Location: 753860
Implantable Lead Model: 377
Implantable Lead Model: 402266
Implantable Lead Serial Number: 49794726
Implantable Lead Serial Number: 49838890
Implantable Pulse Generator Implant Date: 20180410
Pulse Gen Model: 404622
Pulse Gen Serial Number: 60982098

## 2021-06-18 ENCOUNTER — Ambulatory Visit
Admission: RE | Admit: 2021-06-18 | Discharge: 2021-06-18 | Disposition: A | Discharge status: Home / Self Care | Payer: HMO | Source: Ambulatory Visit | Attending: Legal Medicine | Admitting: Legal Medicine

## 2021-06-18 ENCOUNTER — Other Ambulatory Visit: Payer: Self-pay

## 2021-06-18 DIAGNOSIS — D0511 Intraductal carcinoma in situ of right breast: Secondary | ICD-10-CM | POA: Diagnosis not present

## 2021-06-18 DIAGNOSIS — D0501 Lobular carcinoma in situ of right breast: Secondary | ICD-10-CM | POA: Diagnosis not present

## 2021-06-18 DIAGNOSIS — R928 Other abnormal and inconclusive findings on diagnostic imaging of breast: Secondary | ICD-10-CM

## 2021-06-18 DIAGNOSIS — R921 Mammographic calcification found on diagnostic imaging of breast: Secondary | ICD-10-CM | POA: Diagnosis not present

## 2021-06-19 ENCOUNTER — Other Ambulatory Visit (HOSPITAL_COMMUNITY): Payer: Self-pay | Admitting: *Deleted

## 2021-06-19 MED ORDER — IVABRADINE HCL 7.5 MG PO TABS: 7.5000 mg | ORAL_TABLET | Freq: Two times a day (BID) | ORAL | 3 refills | Status: DC

## 2021-06-21 ENCOUNTER — Telehealth: Payer: Self-pay | Admitting: Neurology

## 2021-06-21 NOTE — Progress Notes (Signed)
 Chronic Care Management Pharmacy Note  07/18/2021 Name:  Allison White MRN:  [id number] DOB:  10/22/1961  Summary: Reviewed patient's home medications and discussed cost of medications.  Discussed benefit of COVID booster and pharmacy team consulted Dr. Morgan to find out if patient should wait until after upcoming breast cancer surgery. Patient instructed to wait per Dr. Morgan's suggestion.  Reviewed affordability options for Uribel and Ajovy. Pharmacy team following-up with patient on these items.  -Recommended considering increase in statin dose if upcoming LDL in September is above goal.     Subjective: Allison White is an 60 y.o. year old female who is a primary patient of Perry, Lawrence Edward, MD.  The CCM team was consulted for assistance with disease management and care coordination needs.    Engaged with patient face to face for initial visit in response to provider referral for pharmacy case management and/or care coordination services.   Consent to Services:  The patient was given the following information about Chronic Care Management services today, agreed to services, and gave verbal consent: 1. CCM service includes personalized support from designated clinical staff supervised by the primary care provider, including individualized plan of care and coordination with other care providers 2. 24/7 contact phone numbers for assistance for urgent and routine care needs. 3. Service will only be billed when office clinical staff spend 20 minutes or more in a month to coordinate care. 4. Only one practitioner may furnish and bill the service in a calendar month. 5.The patient may stop CCM services at any time (effective at the end of the month) by phone call to the office staff. 6. The patient will be responsible for cost sharing (co-pay) of up to 20% of the service fee (after annual deductible is met). Patient agreed to services and consent obtained.  Patient Care Team: Perry,  Lawrence Edward, MD as PCP - General (Family Medicine) Camnitz, Will Martin, MD as PCP - Electrophysiology (Cardiology) Bensimhon, Daniel R, MD as PCP - Advanced Heart Failure (Cardiology) Brown, Sarah B, RPH as Pharmacist (Pharmacist)  Recent office visits:  04/03/21- Lawrence Perry, MD - seen for follow up of hypertension, started valsartan 40 mg daily, referral to gastroenterology, labs ordered, mammogram ordered, follow up 4 months 01/01/21- Lawrence Perry, MD- seen for chronic visit, labs ordered, no medication changes, follow up 3 months   Recent consult visits:  06/14/21- Patient Message- Jessica Milford, FNP (Cardiology) increased corlanor back to 7.5 due to increased HR 06/01/21- Patient Message- Daniel Bensimhon, MD (Cardiology) discontinued valsartan due to  BP irregularity 05/22/21- Telephone encounter- Traci Turner, MD (Cardiology) ordered PSG after normal sleep study 05/14/21- Amy Lomax, NP (Neurology)- seen for migraine follow up, started Ajovy 225 mg/ 1.5 ml one self administered injection every 30 days, follow up 3 months 05/07/21- Will Martin Camnitz, MD (Cardiology)- seen for  evaluation of systolic heart failure, no medication changes, follow up 1 yr  02/20/21-Patient Message- Daniel Bensimhon, MD (Cardiology) discontinued entresto and started losartan 25 mg daily at bedtime due to low BP 02/05/21- Daniel Bensimhon, MD (Cardiology)-  seen for heart failure follow up, labs ordered, no medication changes, follow up 3 months  01/15/21- Antonia Ahern, MD (Neurology)- seen for chronic daily headaches, started emgality 120 mg/ml  every 30 days, started ubrelvy 100 mg every 2 hours prn max dose 200 mg/ day, CT performed, follow up 4 month   Hospital visits:  None in previous 6 months   Objective:  Lab Results  Component Value Date     CREATININE 0.89 05/08/2021   BUN 16 05/08/2021   GFR 71.82 07/22/2013   GFRNONAA >60 05/08/2021   GFRAA 70 01/01/2021   NA 137 05/08/2021    K 3.7 05/08/2021   CALCIUM 9.8 05/08/2021   CO2 34 (H) 05/08/2021   GLUCOSE 121 (H) 05/08/2021    Lab Results  Component Value Date/Time   HGBA1C 6.1 10/25/2020 12:00 AM   GFR 71.82 07/22/2013 03:28 PM    Last diabetic Eye exam:  Lab Results  Component Value Date/Time   HMDIABEYEEXA No Retinopathy 10/25/2020 12:00 AM    Last diabetic Foot exam:  Lab Results  Component Value Date/Time   HMDIABFOOTEX Negative 10/25/2020 12:00 AM     Lab Results  Component Value Date   CHOL 190 01/01/2021   HDL 47 01/01/2021   LDLCALC 103 (H) 01/01/2021   TRIG 236 (H) 01/01/2021   CHOLHDL 4.0 01/01/2021    Hepatic Function Latest Ref Rng & Units 01/01/2021 07/17/2020 04/12/2020  Total Protein 6.0 - 8.5 g/dL 7.6 7.1 7.5  Albumin 3.8 - 4.9 g/dL 4.7 4.5 4.4  AST 0 - 40 IU/L 31 28 66(H)  ALT 0 - 32 IU/L 46(H) 33(H) 109(H)  Alk Phosphatase 44 - 121 IU/L 126(H) 118 133(H)  Total Bilirubin 0.0 - 1.2 mg/dL 0.8 0.6 0.6    Lab Results  Component Value Date/Time   TSH 2.880 07/17/2020 10:11 AM    CBC Latest Ref Rng & Units 01/01/2021 10/16/2020 07/17/2020  WBC 3.4 - 10.8 x10E3/uL 8.5 10.7(H) 8.9  Hemoglobin 11.1 - 15.9 g/dL 17.1(H) 16.5(H) 16.1(H)  Hematocrit 34.0 - 46.6 % 50.2(H) 48.1(H) 46.5  Platelets 150 - 450 x10E3/uL 275 260 247    No results found for: VD25OH  Clinical ASCVD: No  The 10-year ASCVD risk score Mikey Bussing DC Jr., et al., 2013) is: 4.5%   Values used to calculate the score:     Age: 20 years     Sex: Female     Is Non-Hispanic African American: No     Diabetic: Yes     Tobacco smoker: No     Systolic Blood Pressure: 94 mmHg     Is BP treated: Yes     HDL Cholesterol: 47 mg/dL     Total Cholesterol: 190 mg/dL    Depression screen [facility] 2/9 10/26/2020 07/17/2020 07/17/2020  Decreased Interest 0 0 0  Down, Depressed, Hopeless 0 0 0  PHQ - 2 Score 0 0 0  Altered sleeping - 2 2  Tired, decreased energy - 3 3  Change in appetite - 3 3  Feeling bad or failure about yourself   - - 0  Trouble concentrating - 0 0  Moving slowly or fidgety/restless - 0 0  Suicidal thoughts - 0 0  PHQ-9 Score - 8 8  Difficult doing work/chores - Very difficult Very difficult     Other: (CHADS2VASc if Afib, MMRC or CAT for COPD, ACT, DEXA)  Social History   Tobacco Use  Smoking Status Former   Packs/day: 1.00   Years: 6.00   Pack years: 6.00   Types: Cigarettes  Smokeless Tobacco Never  Tobacco Comments   Quit over 30 years ago   BP Readings from Last 3 Encounters:  05/14/21 94/62  05/08/21 110/70  05/07/21 115/75   Pulse Readings from Last 3 Encounters:  05/14/21 68  05/08/21 72  05/07/21 71   Wt Readings from Last 3 Encounters:  05/14/21 239 lb 6.4 oz (108.6 kg)  05/08/21 240 lb 12.8  oz (109.2 kg)  05/07/21 241 lb (109.3 kg)   BMI Readings from Last 3 Encounters:  05/14/21 39.84 kg/m  05/08/21 36.61 kg/m  05/07/21 36.64 kg/m    Assessment/Interventions: Review of patient past medical history, allergies, medications, health status, including review of consultants reports, laboratory and other test data, was performed as part of comprehensive evaluation and provision of chronic care management services.   SDOH:  (Social Determinants of Health) assessments and interventions performed: Yes  SDOH Screenings   Alcohol Screen: Not on file  Depression (PHQ2-9): Low Risk    PHQ-2 Score: 0  Financial Resource Strain: Not on file  Food Insecurity: Not on file  Housing: Low Risk    Last Housing Risk Score: 0  Physical Activity: Not on file  Social Connections: Moderately Integrated   Frequency of Communication with Friends and Family: More than three times a week   Frequency of Social Gatherings with Friends and Family: Twice a week   Attends Religious Services: 1 to 4 times per year   Active Member of Clubs or Organizations: No   Attends Club or Organization Meetings: Never   Marital Status: Married  Stress: Not on file  Tobacco Use: Medium Risk    Smoking Tobacco Use: Former   Smokeless Tobacco Use: Never  Transportation Needs: No Transportation Needs   Lack of Transportation (Medical): No   Lack of Transportation (Non-Medical): No    CCM Care Plan  Allergies  Allergen Reactions   Tape Rash    PREFERS CLOTH OR NOTHING   Plavix [Clopidogrel Bisulfate] Other (See Comments)    Joint pain    Medications Reviewed Today     Reviewed by Brown, Sarah B, RPH (Pharmacist) on 07/02/21 at 1441  Med List Status: <None>   Medication Order Taking? Sig Documenting Provider Last Dose Status Informant  aspirin 81 MG EC tablet [id number] Yes Take 81 mg by mouth daily.  [provider] Taking Active Self  atorvastatin (LIPITOR) 20 MG tablet [id number] Yes Take 1 tablet by mouth once daily Perry, Lawrence Edward, MD Taking Active   dapagliflozin propanediol (FARXIGA) 10 MG TABS tablet [id number] Yes Take 1 tablet (10 mg total) by mouth daily. Bensimhon, Daniel R, MD Taking Active   digoxin (LANOXIN) 0.125 MG tablet [id number] Yes TAKE 1 TABLET BY MOUTH ONCE DAILY. APPT REQUIRED FOR FUTURE REFILLS Bensimhon, Daniel R, MD Taking Active   Fremanezumab-vfrm (AJOVY) 225 MG/1.5ML SOAJ [id number] Yes Inject 225 mg into the skin every 30 (thirty) days. Lomax, Amy, NP Taking Active   Fremanezumab-vfrm (AJOVY) 225 MG/1.5ML SOAJ [id number] No Inject 225 mg into the skin every 30 (thirty) days.  Patient not taking: Reported on 07/02/2021   Lomax, Amy, NP Not Taking Consider Medication Status and Discontinue   HYDROcodone-acetaminophen (NORCO) 7.5-325 MG tablet [id number] Yes Take 1 tablet by mouth every 6 (six) hours as needed for moderate pain. Cox, Kirsten, MD Taking Active   ivabradine (CORLANOR) 7.5 MG TABS tablet [id number] Yes Take 1 tablet (7.5 mg total) by mouth 2 (two) times daily with a meal. Milford, Jessica M, FNP Taking Active   LORATADINE PO [id number] Yes Take by mouth daily. [facility] brand [provider] Taking Active Self   metoprolol succinate (TOPROL-XL) 50 MG 24 hr tablet [id number] Yes Take 1 tablet by mouth twice daily Bensimhon, Daniel R, MD Taking Active   nitroGLYCERIN (NITROSTAT) 0.4 MG SL tablet [id number] Yes Place 0.4 mg under the tongue every 5 (five) minutes as needed for chest pain. [provider] Taking Active   spironolactone (ALDACTONE) 25 MG tablet [id number] Yes Take 1 tablet (25 mg total) by mouth daily. Lillard Anes, MD Taking Active   torsemide [facility]) 100 MG tablet [id number] Yes Take 50 mg by mouth daily. [provider] Taking Active   valsartan (DIOVAN) 40 MG tablet [id number] No Take 1 tablet (40 mg total) by mouth daily.  Patient not taking: Reported on 07/02/2021   Lillard Anes, MD Not Taking Consider Medication Status and Discontinue             Patient Active Problem List   Diagnosis Date Noted   Chronic migraine without aura, with intractable migraine, so stated, with status migrainosus 01/15/2021   Migraine with aura and without status migrainosus, not intractable 01/15/2021   BMI 40.0-44.9, adult ([place]) 01/01/2021   Routine general medical examination at a health care facility 10/26/2020   Chronic pain 04/12/2020   Pacemaker 04/12/2020   Morbid obesity ([place]) 04/10/2018   Myalgia 09/22/2017   Cardiomyopathy ([place]) 07/31/2017   Presence of automatic (implantable) cardiac defibrillator 02/25/2017   Decreased cardiac ejection fraction 01/23/2017   Fibromuscular dysplasia ([place]) 58/07/9832   Chronic systolic congestive heart failure ([place]) 12/05/2016   Hypertensive heart disease with heart failure ([place]) 12/05/2016   Pleural effusion 12/01/2016   Intractable migraine without aura and with status migrainosus 01/31/2015   Chronic daily headache 07/23/2013   Carotid aneurysm, left ([place]) 07/23/2013   Vertigo    Headache(784.0)    Hyperlipidemia     Immunization History  Administered Date(s) Administered   Influenza Inj Mdck Quad Pf  10/26/2020   PFIZER(Purple Top)SARS-COV-2 Vaccination 02/21/2020, 03/20/2020   Pneumococcal Polysaccharide-23 09/08/2019    Conditions to be addressed/monitored:  Hypertension, Hyperlipidemia, Heart Failure, and migraine  Care Plan : [place]  Updates made by Burnice Logan, [place] since 07/18/2021 12:00 AM     Problem: htn, hld, heart failure   Priority: High  Onset Date: 07/06/2021     Long-Range Goal: Disease State Management   Start Date: 07/06/2021  Expected End Date: 07/06/2022  This Visit's Progress: On track  Priority: High  Note:   Current Barriers:  Unable to independently afford treatment regimen  Pharmacist Clinical Goal(s):  Patient will verbalize ability to afford treatment regimen through collaboration with PharmD and provider.   Interventions: 1:1 collaboration with Lillard Anes, MD regarding development and update of comprehensive plan of care as evidenced by provider attestation and co-signature Inter-disciplinary care team collaboration (see longitudinal plan of care) Comprehensive medication review performed; medication list updated in electronic medical record  (BP goal <130/80) -Controlled -Current treatment: Metoprolol succinate 50 mg twice daily  Spironolactone 25 mg daily  -Medications previously tried: valsartan -Current home readings: well controlled - patient felt washed out when too low on Entresto -Current dietary habits: eats small portions but tries to keep healthy diet -Current exercise habits: limited  -Denies hypotensive/hypertensive symptoms -Educated on BP goals and benefits of medications for prevention of heart attack, stroke and kidney damage; Daily salt intake goal < 2300 mg; Exercise goal of 150 minutes per week; -Counseled to monitor BP at home weekly, document, and provide log at future appointments -Recommended to continue current medication  Hyperlipidemia: (LDL goal < 55) -Not ideally  controlled -Current treatment: atorvastatin 20 mg daily  Aspirin ec 81 mg daily  -Medications previously tried: none reported  -Current dietary patterns: little appetite but patient is trying to eat healthy -Current exercise habits: limited -Educated on  Cholesterol goals;  Benefits of statin for ASCVD risk reduction; Exercise goal of 150 minutes per week; -Recommended considering increase in statin dose if upcoming LDL above goal.   Heart Failure (Goal: manage symptoms and prevent exacerbations) -Controlled -Last ejection fraction: 25%  (Date: 10/2020) -HF type: Systolic -NYHA Class: III (marked limitation of activity)  -Current treatment: Farxiga 10 mg daily  Digoxin 0.125 mg daily  Corlanolr 7.5 mg bid with a meal Metoprolol Succinate 50 mg bid  Nitroglcyerin 0.4 mg every 5 minutes prn chest pain  Torsemide 50 mg daily  Spironolactone 25 mg daily  -Medications previously tried: Entresto, valsartan -Current home BP/HR readings: patient reports bp runs low -Current dietary habits: very little appetite -Current exercise habits: limited  -Educated on Benefits of medications for managing symptoms and prolonging life Importance of weighing daily; if you gain more than 3 pounds in one day or 5 pounds in one week,  -Recommended to continue current medication  Migraine (Goal: prevent/manage migraines) -Not ideally controlled -Current treatment  Ajovy 225 mg every 30 days - cost concerns currently due to doughnut hole Hydrocodone 7.5/325 mg every 6 hours prn  -Medications previously tried: none reported  -Recommended to continue current medication  Health Maintenance -Vaccine gaps: tdap, shingrix, COVID booster (finding out timing due to upcoming breast surgery) -Current therapy:   -Collaborated with surgeon to find out recommendation for COVID vaccine.   Patient Goals/Self-Care Activities Patient will:  - take medications as prescribed focus on medication adherence by  using pill box target a minimum of 150 minutes of moderate intensity exercise weekly engage in dietary modifications by **  Follow Up Plan: Telephone follow up appointment with care management team member scheduled for: 08/2021        Medication Assistance:  Corlanor and Farxiga obtained through patietn assistance with Cardiologist medication assistance program.  Enrollment ends 11/17/2021. Checking on affordability options for Ajovy.   Compliance/Adherence/Medication fill history:   Care Gaps: Last annual wellness visit? 01/01/21 If applicable: Last eye exam / retinopathy screening? Never completed  Last diabetic foot exam? 04/03/21  Star Rating Drugs:  atorvastatin 20 MG -90 DS last filled 04/29/21 dapagliflozin propanediol  10 MG - 5 DS last filled 02/23/21 valsartan 40 MG - Discontinued by provider  Patient's preferred pharmacy is:  [facility] - [place], [place] - [address] [place] [address] Phone: [phone] Fax: [phone]  [facility] - [place], [place] - [address] [place] [address] Phone: [phone] Fax: [phone]  Uses pill box? Yes Pt endorses good compliance  We discussed: Benefits of medication synchronization, packaging and delivery as well as enhanced pharmacist oversight with [employer]. Patient decided to: Continue current medication management strategy  Care Plan and Follow Up Patient Decision:  Patient agrees to Care Plan and Follow-up.  Plan: Telephone follow up appointment with care management team member scheduled for:  08/2021     

## 2021-06-21 NOTE — Telephone Encounter (Signed)
PA submitted through [facility] [id number] Will wait for a response

## 2021-06-22 NOTE — Progress Notes (Signed)
Patient has intraductal carcinoma in situ- speak with surgeon about options lp

## 2021-06-25 ENCOUNTER — Telehealth: Payer: Self-pay

## 2021-06-25 NOTE — Telephone Encounter (Signed)
PA had to be resubmitted via paper PA- will fax after Amy signs to [employer]

## 2021-06-25 NOTE — Chronic Care Management (AMB) (Signed)
Chronic Care Management Pharmacy Assistant   Name: Allison White  MRN: [id number] DOB: [birth year]  Allison White is an 60 y.o. year old female who presents for her initial CCM visit with the clinical pharmacist.  Reason for Encounter: Chart Prep/IQ  Recent office visits:  04/03/21- Reinaldo Meeker, MD - seen for follow up of hypertension, started valsartan 40 mg daily, referral to gastroenterology, labs ordered, mammogram ordered, follow up 4 months 01/01/21- Reinaldo Meeker, MD- seen for chronic visit, labs ordered, no medication changes, follow up 3 months  Recent consult visits:  06/14/21- Patient Message- Allena Katz, [place] (Cardiology) increased corlanor back to 7.5 due to increased HR 06/01/21- Patient Message- Glori Bickers, MD (Cardiology) discontinued valsartan due to  BP irregularity 05/22/21- Telephone encounter- Fransico Him, MD (Cardiology) ordered PSG after normal sleep study 05/14/21- Debbora Presto, NP (Neurology)- seen for migraine follow up, started Ajovy 225 mg/ 1.5 ml one self administered injection every 30 days, follow up 3 months 05/07/21- Will Meredith Leeds, MD (Cardiology)- seen for  evaluation of systolic heart failure, no medication changes, follow up 1 yr  02/20/21-Patient Message- Glori Bickers, MD (Cardiology) discontinued entresto and started losartan 25 mg daily at bedtime due to low BP 02/05/21- Glori Bickers, MD (Cardiology)-  seen for heart failure follow up, labs ordered, no medication changes, follow up 3 months  01/15/21- Sarina Ill, MD (Neurology)- seen for chronic daily headaches, started emgality 120 mg/ml  every 30 days, started ubrelvy 100 mg every 2 hours prn max dose 200 mg/ day, CT performed, follow up 4 month  Hospital visits:  None in previous 6 months  Medications: Outpatient Encounter Medications as of 06/25/2021  Medication Sig   aspirin 81 MG EC tablet Take 81 mg by mouth daily.    atorvastatin (LIPITOR) 20 MG tablet Take 1  tablet by mouth once daily   dapagliflozin propanediol (FARXIGA) 10 MG TABS tablet Take 1 tablet (10 mg total) by mouth daily.   digoxin (LANOXIN) 0.125 MG tablet TAKE 1 TABLET BY MOUTH ONCE DAILY. APPT REQUIRED FOR FUTURE REFILLS   Fremanezumab-vfrm (AJOVY) 225 MG/1.5ML SOAJ Inject 225 mg into the skin every 30 (thirty) days.   Fremanezumab-vfrm (AJOVY) 225 MG/1.5ML SOAJ Inject 225 mg into the skin every 30 (thirty) days.   HYDROcodone-acetaminophen (NORCO) 7.5-325 MG tablet Take 1 tablet by mouth every 6 (six) hours as needed for moderate pain.   ivabradine (CORLANOR) 7.5 MG TABS tablet Take 1 tablet (7.5 mg total) by mouth 2 (two) times daily with a meal.   LORATADINE PO Take by mouth daily. [facility] brand   metoprolol succinate (TOPROL-XL) 50 MG 24 hr tablet Take 1 tablet by mouth twice daily   nitroGLYCERIN (NITROSTAT) 0.4 MG SL tablet Place 0.4 mg under the tongue every 5 (five) minutes as needed for chest pain.   spironolactone (ALDACTONE) 25 MG tablet Take 1 tablet (25 mg total) by mouth daily.   torsemide (DEMADEX) 100 MG tablet Take 50 mg by mouth daily.   valsartan (DIOVAN) 40 MG tablet Take 1 tablet (40 mg total) by mouth daily.   No facility-administered encounter medications on file as of 06/25/2021.    Lab Results  Component Value Date/Time   HGBA1C 6.1 10/25/2020 12:00 AM     BP Readings from Last 3 Encounters:  05/14/21 94/62  05/08/21 110/70  05/07/21 115/75   Current Documented Medications digoxin 0.125 MG - 90 DS last filled 06/04/21 HYDROcodone-acetaminophen 7.5-325 MG- 23 DS last filled 05/23/21 ivabradine  7.5 MG -  30 DS last filled 05/08/21 metoprolol succinate  50 MG- 90 DS last filled 05/01/21 nitroGLYCERIN 0.4 MG- 30 DS last filled 04/19/20 spironolactone 25 MG - 90 DS last filled 05/14/21 torsemide 100 MG - 90 DS last filled 06/04/21  No Fill Hx Fremanezumab-vfrm 225 MG/1.5ML SOAJ LORATADINE PO aspirin 81 MG EC tablet  Star Rating Drugs:   atorvastatin 20 MG -90 DS last filled 04/29/21 dapagliflozin propanediol  10 MG - 5 DS last filled 02/23/21 valsartan 40 MG - Discontinued by provider  Have you seen any other providers since your last visit with PCP?  06/27/21- Dr. Lilia Pro for lumpectomy 05/14/21- Debbora Presto, NP (Neurology) 05/07/21- Will Meredith Leeds, MD (Cardiology)  Any changes in your medications or health?  Patient had a biopsy that revealed cancer and had a lumpectomy. She will be receiving radiation therapy 5 x weeks for 35 days  06/14/21- increased corlanor back to 7.5 due to increased HR 06/01/21-discontinued valsartan due to  BP irregularity  Any side effects from any medications?  Patient stated she has no side effects from medications  Do you have an symptoms or problems not managed by your medications?  Patient stated she has been having bladder problems connected with cystitis. She takes AZO and Vicodin every 6 hours and it usually relieves itself within 30- 40 minutes. She was previously prescribed a medication that was very helpful and only needed to be taken once daily. Unfortunately insurance did not cover it and it was too expensive Patient stated her migraines are still not controlled by her new medication, Ajovy, but have been getting more manageable since being off of Losartan and Valsartan  Any concerns about your health right now?  Patient stated she has concerns about her bladder problems and migraines  Has your provider asked that you check blood pressure, blood sugar, or follow special diet at home?  Patient stated she uses her wrist cuff to check her BP daily. Her recent ranges are around 126/70.  Patient stated she follows a low carb, low salt diet. She also drinks less than 2 liters of fluid a day to combat fluid retention. She usually drinks water, milk, and orange juice.  Do you get any type of exercise on a regular basis?  Patient stated she swims when she is able to depending on her  cardiac function  Can you think of a goal you would like to reach for your health?  Patient could not think of any goals  Do you have any problems getting your medications?  Patient stated she receives her specialty medications for free and her other medications are manageable.  Is there anything that you would like to discuss during the appointment?  Patient had nothing to discuss  Roux Kohring was reminded to have all medications, supplements and any blood glucose and blood pressure readings available for review with Clarise Cruz B. Joline Salt. D, at her office visit on 08/15 at 2 pm .   Care Gaps: Last annual wellness visit? [id number] If applicable: Last eye exam / retinopathy screening? Never completed  Last diabetic foot exam? 04/03/21  [employer] Sports CPA, [place]

## 2021-06-26 NOTE — Telephone Encounter (Signed)
PA faxed to [employer] for the patient. Will await response

## 2021-06-27 DIAGNOSIS — D0501 Lobular carcinoma in situ of right breast: Secondary | ICD-10-CM | POA: Diagnosis not present

## 2021-06-27 DIAGNOSIS — D0511 Intraductal carcinoma in situ of right breast: Secondary | ICD-10-CM | POA: Diagnosis not present

## 2021-06-29 ENCOUNTER — Telehealth (HOSPITAL_COMMUNITY): Payer: Self-pay | Admitting: *Deleted

## 2021-06-29 NOTE — Telephone Encounter (Signed)
Received fax from [place], pt needs clearance for R breast lumpectomy on 07/16/21 under general anesthesia  Per Dr Haroldine Laws: Pt is medically optimized for surgery as planned above  Note faxed back to [place] at [phone]

## 2021-07-02 ENCOUNTER — Ambulatory Visit (INDEPENDENT_AMBULATORY_CARE_PROVIDER_SITE_OTHER): Payer: HMO

## 2021-07-02 ENCOUNTER — Other Ambulatory Visit: Payer: Self-pay

## 2021-07-02 DIAGNOSIS — I110 Hypertensive heart disease with heart failure: Secondary | ICD-10-CM | POA: Diagnosis not present

## 2021-07-02 DIAGNOSIS — E7849 Other hyperlipidemia: Secondary | ICD-10-CM

## 2021-07-05 ENCOUNTER — Telehealth: Payer: Self-pay

## 2021-07-05 NOTE — Chronic Care Management (AMB) (Signed)
Chronic Care Management Pharmacy Assistant   Name: Allison White  MRN: [id number] DOB: Apr 16, 1961  Reason for Encounter: Patient Assistance Coordination and Care Coordination with Outside Provider  8/18/2022[facility] the office of Dr Orrin Brigham inquiring if patient can have a Covid Booster Vaccine, patient is scheduled for breast lumpectomy 08/29. Spoke with Mrs Gilford Rile, called wrong office patient is going to the [place] location. [facility] [place], spoke with Ivin Booty, per Dr Lilia Pro patient should wait until after surgery, he does not want booster to cause any swelling to lymph nodes that could interfere with surgery.   Ajovy patient assistance application filled out online on patient behalf, determination of approval will be sent to patient email account.  Researched patient assistance for Lynnda Shields, there is a program through [employer] to apply for patient assistance, provider will need to send a letter to patient assistance program indicating patient needs of mediation, duration, diagnosis then an application will be sent to providers office to be filled out.  Called patient to inform on status of all 3 items. Patient aware she will not get Covid Booster until after surgery, patient will be on the lookout for determination of Ajovy in her yahoo account and she is aware I am sending a request to Donette Larry, CPP to see if Dr Henrene Pastor would sign letter to get application for assistance with Uribel.   For Uribel and due to patient having insurance, will work on Regulatory affairs officer to get medication covered through insurance.   07/16/2021- Called patient to follow up with [employer] prior authorization, no success with medication and noticed that medication is no longer on her list, per spouse patient is lying down at the moment and he will have her call me back. Patient just had surgery today. Will follow up with patient in a  week.   Medications: Outpatient Encounter Medications as of 07/05/2021  Medication Sig   aspirin 81 MG EC tablet Take 81 mg by mouth daily.    atorvastatin (LIPITOR) 20 MG tablet Take 1 tablet by mouth once daily   dapagliflozin propanediol (FARXIGA) 10 MG TABS tablet Take 1 tablet (10 mg total) by mouth daily.   digoxin (LANOXIN) 0.125 MG tablet TAKE 1 TABLET BY MOUTH ONCE DAILY. APPT REQUIRED FOR FUTURE REFILLS   Fremanezumab-vfrm (AJOVY) 225 MG/1.5ML SOAJ Inject 225 mg into the skin every 30 (thirty) days.   Fremanezumab-vfrm (AJOVY) 225 MG/1.5ML SOAJ Inject 225 mg into the skin every 30 (thirty) days. (Patient not taking: Reported on 07/02/2021)   HYDROcodone-acetaminophen (NORCO) 7.5-325 MG tablet Take 1 tablet by mouth every 6 (six) hours as needed for moderate pain.   ivabradine (CORLANOR) 7.5 MG TABS tablet Take 1 tablet (7.5 mg total) by mouth 2 (two) times daily with a meal.   LORATADINE PO Take by mouth daily. [facility] brand   metoprolol succinate (TOPROL-XL) 50 MG 24 hr tablet Take 1 tablet by mouth twice daily   nitroGLYCERIN (NITROSTAT) 0.4 MG SL tablet Place 0.4 mg under the tongue every 5 (five) minutes as needed for chest pain.   spironolactone (ALDACTONE) 25 MG tablet Take 1 tablet (25 mg total) by mouth daily.   torsemide (DEMADEX) 100 MG tablet Take 50 mg by mouth daily.   valsartan (DIOVAN) 40 MG tablet Take 1 tablet (40 mg total) by mouth daily. (Patient not taking: Reported on 07/02/2021)   No facility-administered encounter medications on file as of 07/05/2021.    Care Gaps:  HIV Screening- Never done Hepatitis C Screening (Once)  TETANUS/TDAP (Every 10 Years) Zoster Vaccines- Shingrix (1 of 2) COVID-19 Vaccine (3 - Pfizer risk series)- Last completed: Mar 20, 2020 Pneumococcal Vaccine 41-25 Years old (2 - PCV)- Last completed: Sep 08, 2019 HEMOGLOBIN A1C (Every 6 Months)-Last completed: Oct 25, 2020 INFLUENZA VACCINE (Every 8 Months, August to March)- Last completed:  Oct 26, 2020   Star Rating Drugs: Valsartan 40 mg- Last filled 04/04/2021 for 90 day supply at [facility]. Farxiga 10 mg- Last filled 02/23/2021- PAP Atorvastatin 20 mg- Last filled 04/29/2021 for 90 day supply at [facility].  Pattricia Boss, [place] Pharmacist Assistant [phone]'

## 2021-07-11 ENCOUNTER — Telehealth: Payer: Self-pay | Admitting: Family Medicine

## 2021-07-11 DIAGNOSIS — G43711 Chronic migraine without aura, intractable, with status migrainosus: Secondary | ICD-10-CM

## 2021-07-11 MED ORDER — AJOVY 225 MG/1.5ML ~~LOC~~ SOAJ: 225.0000 mg | SUBCUTANEOUS | 3 refills | Status: DC

## 2021-07-11 NOTE — Telephone Encounter (Signed)
Faxed printed/signed rx to [facility] at fax# below. Received fax confirmation

## 2021-07-11 NOTE — Progress Notes (Signed)
Remote ICD transmission.   

## 2021-07-11 NOTE — Telephone Encounter (Signed)
Called and spoke w/ [facility]/Brittney. Provided ICD 10 code verbally: G43.711.  They also need printed rx for Ajovy faxed to them. Confirmed fax# correct that was provided. Printed rx, waiting on NP signature

## 2021-07-11 NOTE — Telephone Encounter (Signed)
Allison White from [employer] called stating that the pt is needing financial assistance for her Ajovy and they are needing a prescription and the ICD 10 code faxed to them at [phone]

## 2021-07-12 ENCOUNTER — Encounter (HOSPITAL_COMMUNITY): Payer: Self-pay

## 2021-07-12 ENCOUNTER — Encounter: Payer: Self-pay | Admitting: Family Medicine

## 2021-07-12 NOTE — Telephone Encounter (Signed)
[facility] @ [facility] [employer] called re: being on the next step now, pt needing a PA for the Ajovy. This is as non formulary for pt.  The call back # is [phone] option 3(for Financial Assistance) the fax # is the [phone]

## 2021-07-12 NOTE — Telephone Encounter (Addendum)
I reviewed pt chart. Looks like CB,RN submitted PA for this back on 06/26/21. No determination listed. Called [employer] at [phone]. Spoke [place]. She was not a trained rep for this pt plan. She transferred me. Spoke w/ Brooke Pace. PA Ajovy approved 06/26/21-11/17/21. PA# [id number]. She will fax approval letter to [place] at [phone]. Received fax, see below.    I called [facility] back and spoke w/ Pearson Grippe. Provided PA approval info, nothing further needed.

## 2021-07-16 ENCOUNTER — Encounter: Payer: Self-pay | Admitting: Legal Medicine

## 2021-07-16 DIAGNOSIS — I2510 Atherosclerotic heart disease of native coronary artery without angina pectoris: Secondary | ICD-10-CM | POA: Diagnosis not present

## 2021-07-16 DIAGNOSIS — C50911 Malignant neoplasm of unspecified site of right female breast: Secondary | ICD-10-CM | POA: Diagnosis not present

## 2021-07-16 DIAGNOSIS — D0501 Lobular carcinoma in situ of right breast: Secondary | ICD-10-CM | POA: Diagnosis not present

## 2021-07-16 DIAGNOSIS — Z87891 Personal history of nicotine dependence: Secondary | ICD-10-CM | POA: Diagnosis not present

## 2021-07-16 DIAGNOSIS — D0511 Intraductal carcinoma in situ of right breast: Secondary | ICD-10-CM | POA: Diagnosis not present

## 2021-07-16 DIAGNOSIS — I10 Essential (primary) hypertension: Secondary | ICD-10-CM | POA: Diagnosis not present

## 2021-07-18 NOTE — Patient Instructions (Signed)
Visit Information  Thank you for your time discussing your medications. I look forward to working with you to achieve your health care goals. Below is a summary of what we talked about during our visit.    Goals Addressed             This Visit's Progress    Lifestyle Change-Hypertension       Timeframe:  Long-Range Goal Priority:  High Start Date:                             Expected End Date:                       Follow Up Date 08/2021    - ask questions to understand    Why is this important?   The changes that you are asked to make may be hard to do.  This is especially true when the changes are life-long.  Knowing why it is important to you is the first step.  Working on the change with your family or support person helps you not feel alone.  Reward yourself and family or support person when goals are met. This can be an activity you choose like bowling, hiking, biking, swimming or shooting hoops.     Notes:      Manage My Medicine       Timeframe:  Long-Range Goal Priority:  High Start Date:                             Expected End Date:                       Follow Up Date 08/2021    - call for medicine refill 2 or 3 days before it runs out - keep a list of all the medicines I take; vitamins and herbals too - use a pillbox to sort medicine    Why is this important?   These steps will help you keep on track with your medicines.   Notes:      Track and Manage Symptoms-Heart Failure       Timeframe:  Long-Range Goal Priority:  High Start Date:                             Expected End Date:                       Follow Up Date 08/2021    - eat more whole grains, fruits and vegetables, White meats and healthy fats - know when to call the doctor    Why is this important?   You will be able to handle your symptoms better if you keep track of them.  Making some simple changes to your lifestyle will help.  Eating healthy is one thing you can do to take good  care of yourself.    Notes:         Patient Care Plan: CCM Pharmacy Care Plan     Problem Identified: htn, hld, heart failure   Priority: High  Onset Date: 07/06/2021     Long-Range Goal: Disease State Management   Start Date: 07/06/2021  Expected End Date: 07/06/2022  This Visit's Progress: On track  Priority: High  Note:   Current Barriers:  Unable to independently afford treatment regimen  Pharmacist Clinical Goal(s):  Patient will verbalize ability to afford treatment regimen through collaboration with PharmD and provider.   Interventions: 1:1 collaboration with Lillard Anes, MD regarding development and update of comprehensive plan of care as evidenced by provider attestation and co-signature Inter-disciplinary care team collaboration (see longitudinal plan of care) Comprehensive medication review performed; medication list updated in electronic medical record  (BP goal <130/80) -Controlled -Current treatment: Metoprolol succinate 50 mg twice daily  Spironolactone 25 mg daily  -Medications previously tried: valsartan -Current home readings: well controlled - patient felt washed out when too low on Entresto -Current dietary habits: eats small portions but tries to keep healthy diet -Current exercise habits: limited  -Denies hypotensive/hypertensive symptoms -Educated on BP goals and benefits of medications for prevention of heart attack, stroke and kidney damage; Daily salt intake goal < 2300 mg; Exercise goal of 150 minutes per week; -Counseled to monitor BP at home weekly, document, and provide log at future appointments -Recommended to continue current medication  Hyperlipidemia: (LDL goal < 55) -Not ideally controlled -Current treatment: atorvastatin 20 mg daily  Aspirin ec 81 mg daily  -Medications previously tried: none reported  -Current dietary patterns: little appetite but patient is trying to eat healthy -Current exercise habits:  limited -Educated on Cholesterol goals;  Benefits of statin for ASCVD risk reduction; Exercise goal of 150 minutes per week; -Recommended considering increase in statin dose if upcoming LDL above goal.   Heart Failure (Goal: manage symptoms and prevent exacerbations) -Controlled -Last ejection fraction: 25%  (Date: 10/2020) -HF type: Systolic -NYHA Class: III (marked limitation of activity)  -Current treatment: Farxiga 10 mg daily  Digoxin 0.125 mg daily  Corlanolr 7.5 mg bid with a meal Metoprolol Succinate 50 mg bid  Nitroglcyerin 0.4 mg every 5 minutes prn chest pain  Torsemide 50 mg daily  Spironolactone 25 mg daily  -Medications previously tried: Entresto, valsartan -Current home BP/HR readings: patient reports bp runs low -Current dietary habits: very little appetite -Current exercise habits: limited  -Educated on Benefits of medications for managing symptoms and prolonging life Importance of weighing daily; if you gain more than 3 pounds in one day or 5 pounds in one week,  -Recommended to continue current medication  Migraine (Goal: prevent/manage migraines) -Not ideally controlled -Current treatment  Ajovy 225 mg every 30 days - cost concerns currently due to doughnut hole Hydrocodone 7.5/325 mg every 6 hours prn  -Medications previously tried: none reported  -Recommended to continue current medication  Health Maintenance -Vaccine gaps: tdap, shingrix, COVID booster (finding out timing due to upcoming breast surgery) -Current therapy:   -Collaborated with surgeon to find out recommendation for COVID vaccine.   Patient Goals/Self-Care Activities Patient will:  - take medications as prescribed focus on medication adherence by using pill box target a minimum of 150 minutes of moderate intensity exercise weekly engage in dietary modifications by **  Follow Up Plan: Telephone follow up appointment with care management team member scheduled for: 08/2021        Allison White was given information about Chronic Care Management services today including:  CCM service includes personalized support from designated clinical staff supervised by her physician, including individualized plan of care and coordination with other care providers 24/7 contact phone numbers for assistance for urgent and routine care needs. Standard insurance, coinsurance, copays and deductibles apply for chronic care management only during months in which we provide at least 20 minutes of these services.  Most insurances cover these services at 100%, however patients may be responsible for any copay, coinsurance and/or deductible if applicable. This service may help you avoid the need for more expensive face-to-face services. Only one practitioner may furnish and bill the service in a calendar month. The patient may stop CCM services at any time (effective at the end of the month) by phone call to the office staff.  Patient agreed to services and verbal consent obtained.   Patient verbalizes understanding of instructions provided today and agrees to view in [place].  Telephone follow up appointment with pharmacy team member scheduled for: 08/2021  Sherre Poot, PharmD Clinical Pharmacist [facility] [phone] (office) [phone] (mobile)

## 2021-07-19 HISTORY — PX: BREAST LUMPECTOMY: SHX2

## 2021-07-24 DIAGNOSIS — D0511 Intraductal carcinoma in situ of right breast: Secondary | ICD-10-CM | POA: Diagnosis not present

## 2021-07-25 ENCOUNTER — Other Ambulatory Visit: Payer: Self-pay | Admitting: Family Medicine

## 2021-07-25 DIAGNOSIS — G894 Chronic pain syndrome: Secondary | ICD-10-CM

## 2021-07-25 MED ORDER — HYDROCODONE-ACETAMINOPHEN 7.5-325 MG PO TABS: 1.0000 | ORAL_TABLET | Freq: Four times a day (QID) | ORAL | 0 refills | Status: DC | PRN

## 2021-07-30 NOTE — Telephone Encounter (Signed)
Ok to schedule NPSG valid dates are 06/18/21-09-16-21 aut [phone]

## 2021-08-03 ENCOUNTER — Other Ambulatory Visit: Payer: Self-pay

## 2021-08-03 ENCOUNTER — Encounter: Payer: Self-pay | Admitting: Legal Medicine

## 2021-08-03 ENCOUNTER — Ambulatory Visit: Payer: HMO | Admitting: Legal Medicine

## 2021-08-03 ENCOUNTER — Ambulatory Visit (INDEPENDENT_AMBULATORY_CARE_PROVIDER_SITE_OTHER): Payer: HMO | Admitting: Legal Medicine

## 2021-08-03 VITALS — BP 130/88 | HR 73 | Temp 97.8°F | Ht 65.0 in | Wt 233.2 lb

## 2021-08-03 DIAGNOSIS — Z0000 Encounter for general adult medical examination without abnormal findings: Secondary | ICD-10-CM

## 2021-08-03 DIAGNOSIS — I5022 Chronic systolic (congestive) heart failure: Secondary | ICD-10-CM | POA: Diagnosis not present

## 2021-08-03 DIAGNOSIS — Z171 Estrogen receptor negative status [ER-]: Secondary | ICD-10-CM

## 2021-08-03 DIAGNOSIS — Z95810 Presence of automatic (implantable) cardiac defibrillator: Secondary | ICD-10-CM | POA: Diagnosis not present

## 2021-08-03 DIAGNOSIS — C50611 Malignant neoplasm of axillary tail of right female breast: Secondary | ICD-10-CM

## 2021-08-03 DIAGNOSIS — G43011 Migraine without aura, intractable, with status migrainosus: Secondary | ICD-10-CM | POA: Diagnosis not present

## 2021-08-03 DIAGNOSIS — I110 Hypertensive heart disease with heart failure: Secondary | ICD-10-CM | POA: Diagnosis not present

## 2021-08-03 DIAGNOSIS — E7849 Other hyperlipidemia: Secondary | ICD-10-CM

## 2021-08-03 DIAGNOSIS — C50919 Malignant neoplasm of unspecified site of unspecified female breast: Secondary | ICD-10-CM | POA: Insufficient documentation

## 2021-08-03 NOTE — Progress Notes (Signed)
Established Patient Office Visit  Subjective:  Patient ID: Allison White, female    DOB: 1960-12-03  Age: 60 y.o. MRN: [id number]  CC:  Chief Complaint  Patient presents with   Hyperlipidemia   Hypertension    HPI Allison White presents for chronic visit  New intraductal breast cancer, has lumpectomy but they will do a second look.  Patient presents for follow up of hypertension.  Patient tolerating metoprolol well with side effects.  Patient was diagnosed with hypertension 2010 so has been treated for hypertension for 10 years.Patient is working on maintaining diet and exercise regimen and follows up as directed. Complication include none.   Patient presents with hyperlipidemia.  Compliance with treatment has been good; patient takes medicines as directed, maintains low cholesterol diet, follows up as directed, and maintains exercise regimen.  Patient is using atorvastatin without problems.   Patient presents with HFrEF  that is stable. Diagnosis made 2020.  The course of the disease is stable.  Current medicines include lanoxin, torsemide. Patient follows a low cholesterol diet and maintains a weight diary.  Patient is on low salt, low cholesterol diet and avoids alcohol.  Patient denies adverse effects of medicines. Patient is monitoring weight and has lost 7 lbs of weight.  Patient is having no pedal edema, no PND and see cardiology PND.  Patient is continuing to see cardiology.   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    Vertigo     Past Surgical History:  Procedure Laterality Date   ABDOMINAL HYSTERECTOMY     CARDIAC DEFIBRILLATOR  PLACEMENT     CEREBRAL ANEURYSM REPAIR Left 2009   CESAREAN SECTION     CORONARY ANGIOPLASTY WITH STENT PLACEMENT     PACEMAKER IMPLANT     RIGHT/LEFT HEART CATH AND CORONARY ANGIOGRAPHY N/A 04/07/2020   Procedure: RIGHT/LEFT HEART CATH AND CORONARY ANGIOGRAPHY;  Surgeon: Jolaine Artist, MD;  Location: [facility];  Service: Cardiovascular;  Laterality: N/A;   TUBAL LIGATION      Family History  Problem Relation Age of Onset   Thyroid disease Mother    Prostate cancer Father        Prostate   Cerebral aneurysm Paternal Grandmother        Nonruptured   Dementia Paternal Grandfather    Prostate cancer Paternal Grandfather    Hypertension Brother    Breast cancer Neg Hx     Social History   Socioeconomic History   Marital status: Married    Spouse name: Not on file   Number of children: 4   Years of education: Not on file   Highest education level: High school graduate  Occupational History   Not on file  Tobacco Use   Smoking status: Former    Packs/day: 1.00    Years: 6.00    Pack years: 6.00    Types: Cigarettes   Smokeless tobacco: Never   Tobacco comments:    Quit over 30 years ago  Vaping Use   Vaping Use: Never used  Substance and Sexual Activity   Alcohol use: Yes    Comment:  Occasionally "little"   Drug use: No   Sexual activity: Not Currently  Other Topics Concern   Not on file  Social History Narrative   Lives with husband in a one-story home.     Right handed   Caffeine: "very little"   Social Determinants of Health   Financial Resource Strain: Not on file  Food Insecurity: Not on file  Transportation Needs: No Transportation Needs   Lack of Transportation (Medical): No   Lack of Transportation (Non-Medical): No  Physical Activity: Not on file  Stress: Not on file  Social Connections: Moderately Integrated   Frequency of Communication with Friends and Family: More than three times a week   Frequency of Social Gatherings with  Friends and Family: Twice a week   Attends Religious Services: 1 to 4 times per year   Active Member of [employer] or Organizations: No   Attends Archivist Meetings: Never   Marital Status: Married  Human resources officer Violence: Not At Risk   Fear of Current or Ex-Partner: No   Emotionally Abused: No   Physically Abused: No   Sexually Abused: No    Outpatient Medications Prior to Visit  Medication Sig Dispense Refill   aspirin 81 MG EC tablet Take 81 mg by mouth daily.      atorvastatin (LIPITOR) 20 MG tablet Take 1 tablet by mouth once daily 90 tablet 1   dapagliflozin propanediol (FARXIGA) 10 MG TABS tablet Take 1 tablet (10 mg total) by mouth daily. 30 tablet 2   digoxin (LANOXIN) 0.125 MG tablet TAKE 1 TABLET BY MOUTH ONCE DAILY. APPT REQUIRED FOR FUTURE REFILLS 30 tablet 0   Fremanezumab-vfrm (AJOVY) 225 MG/1.5ML SOAJ Inject 225 mg into the skin every 30 (thirty) days. 2 mL 0   HYDROcodone-acetaminophen (NORCO) 7.5-325 MG tablet Take 1 tablet by mouth every 6 (six) hours as needed for moderate pain. 90 tablet 0   ivabradine (CORLANOR) 7.5 MG TABS tablet Take 1 tablet (7.5 mg total) by mouth 2 (two) times daily with a meal. 180 tablet 3   LORATADINE PO Take by mouth daily. [facility] brand     metoprolol succinate (TOPROL-XL) 50 MG 24 hr tablet Take 1 tablet by mouth twice daily 180 tablet 2   nitroGLYCERIN (NITROSTAT) 0.4 MG SL tablet Place 0.4 mg under the tongue every 5 (five) minutes as needed for chest pain.     spironolactone (ALDACTONE) 25 MG tablet Take 1 tablet (25 mg total) by mouth daily. 30 tablet 6   torsemide (DEMADEX) 100 MG tablet Take 50 mg by mouth daily.     Fremanezumab-vfrm (AJOVY) 225 MG/1.5ML SOAJ Inject 225 mg into the skin every 30 (thirty) days. 4.5 mL 3   valsartan (DIOVAN) 40 MG tablet Take 1 tablet (40 mg total) by mouth daily. (Patient not taking: Reported on 07/02/2021) 90 tablet 3   No facility-administered medications prior to visit.    Allergies   Allergen Reactions   Tape Rash    PREFERS CLOTH OR NOTHING   Plavix [Clopidogrel Bisulfate] Other (See Comments)    Joint pain    ROS Review of Systems  Constitutional:  Negative for activity change and appetite change.  HENT:  Negative for congestion.   Eyes:  Negative for visual disturbance.  Respiratory:  Negative for chest tightness and shortness of breath.   Cardiovascular:  Negative for chest pain, palpitations and leg swelling.  Gastrointestinal:  Negative for abdominal pain.  Endocrine: Negative for polyuria.  Genitourinary:  Negative for  difficulty urinating and dysuria.  Musculoskeletal:  Negative for arthralgias and back pain.  Neurological: Negative.   Psychiatric/Behavioral: Negative.       Objective:    Physical Exam Vitals reviewed.  Constitutional:      General: She is not in acute distress.    Appearance: Normal appearance.  HENT:     Head: Normocephalic.     Right Ear: Tympanic membrane, ear canal and external ear normal.     Left Ear: Tympanic membrane and ear canal normal.     Mouth/Throat:     Mouth: Mucous membranes are moist.  Eyes:     Extraocular Movements: Extraocular movements intact.     Conjunctiva/sclera: Conjunctivae normal.     Pupils: Pupils are equal, round, and reactive to light.  Cardiovascular:     Rate and Rhythm: Normal rate and regular rhythm.     Pulses: Normal pulses.     Heart sounds: Normal heart sounds. No murmur heard.   No gallop.  Pulmonary:     Effort: Pulmonary effort is normal. No respiratory distress.     Breath sounds: Normal breath sounds. No wheezing.  Abdominal:     General: Abdomen is flat. Bowel sounds are normal. There is no distension.     Palpations: Abdomen is soft.     Tenderness: There is no abdominal tenderness.  Musculoskeletal:        General: Normal range of motion.     Cervical back: Normal range of motion.  Skin:    General: Skin is warm.     Capillary Refill: Capillary refill takes less  than 2 seconds.  Neurological:     General: No focal deficit present.     Mental Status: She is alert and oriented to person, place, and time. Mental status is at baseline.    BP 130/88   Pulse 73   Temp 97.8 F (36.6 C)   Ht 5' 5"  (1.651 m)   Wt 233 lb 3.2 oz (105.8 kg)   SpO2 95%   BMI 38.81 kg/m  Wt Readings from Last 3 Encounters:  08/03/21 233 lb 3.2 oz (105.8 kg)  05/14/21 239 lb 6.4 oz (108.6 kg)  05/08/21 240 lb 12.8 oz (109.2 kg)     Health Maintenance Due  Topic Date Due   URINE MICROALBUMIN  Never done   HIV Screening  Never done   Hepatitis C Screening  Never done   TETANUS/TDAP  Never done   Zoster Vaccines- Shingrix (1 of 2) Never done   COVID-19 Vaccine (3 - Pfizer risk series) 04/17/2020   HEMOGLOBIN A1C  04/25/2021   INFLUENZA VACCINE  06/18/2021    There are no preventive care reminders to display for this patient.  Lab Results  Component Value Date   TSH 2.880 07/17/2020   Lab Results  Component Value Date   WBC 9.0 08/03/2021   HGB 17.6 (H) 08/03/2021   HCT 50.5 (H) 08/03/2021   MCV 91 08/03/2021   PLT 267 08/03/2021   Lab Results  Component Value Date   NA 139 08/03/2021   K 4.0 08/03/2021   CO2 28 08/03/2021   GLUCOSE 164 (H) 08/03/2021   BUN 19 08/03/2021   CREATININE 1.03 (H) 08/03/2021   BILITOT 1.0 08/03/2021   ALKPHOS 131 (H) 08/03/2021   AST 39 08/03/2021   ALT 66 (H) 08/03/2021   PROT 7.6 08/03/2021   ALBUMIN 4.5 08/03/2021   CALCIUM 10.1 08/03/2021   ANIONGAP 10 05/08/2021   EGFR 63  08/03/2021   GFR 71.82 07/22/2013   Lab Results  Component Value Date   CHOL 190 08/03/2021   Lab Results  Component Value Date   HDL 45 08/03/2021   Lab Results  Component Value Date   LDLCALC 110 (H) 08/03/2021   Lab Results  Component Value Date   TRIG 203 (H) 08/03/2021   Lab Results  Component Value Date   CHOLHDL 4.2 08/03/2021   Lab Results  Component Value Date   HGBA1C 6.1 10/25/2020      Assessment & Plan:    Problem List Items Addressed This Visit       Cardiovascular and Mediastinum   Chronic systolic congestive heart failure (HCC) (Chronic)   Relevant Orders   Digoxin level (Completed) An individualized care plan was established and reinforced.  The patient's disease status was assessed using clinical finding son exam today, labs, and/or other diagnostic testing such as x-rays, to determine the patient's success in meeting treatmentgoalsbased on disease-based guidelines and found to be improving. But not at goal yet. Medications prescriptions no changes Laboratory tests ordered to be performed today include routine. RECOMMENDATIONS: given include see cardiology.  Call physician is patient gains 3 lbs in one day or 5 lbs for one week.  Call for progressive PND, orthopnea or increased pedal edema.     Intractable migraine without aura and with status migrainosus AN INDIVIDUAL CARE PLAN for migraines was established and reinforced today.  The patient's status was assessed using clinical findings on exam, labs, and other diagnostic testing. Patient's success at meeting treatment goals based on disease specific evidence-bassed guidelines and found to be in good control. RECOMMENDATIONS include maintaining present medicines and treatment.     Hypertensive heart disease with heart failure (HCC)   Relevant Orders   CBC with Differential (Completed)   Comprehensive metabolic panel (Completed) An individual hypertension care plan was established and reinforced today.  The patient's status was assessed using clinical findings on exam and labs or diagnostic tests. The patient's success at meeting treatment goals on disease specific evidence-based guidelines and found to be well controlled. SELF MANAGEMENT: The patient and I together assessed ways to personally work towards obtaining the recommended goals. RECOMMENDATIONS: avoid decongestants found in common cold remedies, decrease consumption of alcohol,  perform routine monitoring of BP with home BP cuff, exercise, reduction of dietary salt, take medicines as prescribed, try not to miss doses and quit smoking.  Regular exercise and maintaining a healthy weight is needed.  Stress reduction may help. A CLINICAL SUMMARY including written plan identify barriers to care unique to individual due to social or financial issues.  We attempt to mutually creat solutions for individual and family understanding.      Other   Morbid obesity ([place]) (Chronic) An individualize plan was formulated for obesity using patient history and physical exam to encourage weight loss.  An evidence based program was formulated.  Patient is to cut portion size with meals and to plan physical exercise 3 days a week at least 20 minutes.  Weight watchers and other programs are helpful.  Planned amount of weight loss 10 lbs.     Hyperlipidemia - Primary   Relevant Orders   Lipid panel (Completed) AN INDIVIDUAL CARE PLAN for hyperlipidemia/ cholesterol was established and reinforced today.  The patient's status was assessed using clinical findings on exam, lab and other diagnostic tests. The patient's disease status was assessed based on evidence-based guidelines and found to be well controlled. MEDICATIONS were reviewed.  SELF MANAGEMENT GOALS have been discussed and patient's success at attaining the goal of low cholesterol was assessed. RECOMMENDATION given include regular exercise 3 days a week and low cholesterol/low fat diet. CLINICAL SUMMARY including written plan to identify barriers unique to the patient due to social or economic  reasons was discussed.     Presence of automatic (implantable) cardiac defibrillator Patient has cardiAC defibrillator    Routine general medical examination at a health care facility Physical performed    Breast cancer [facility]) Patient continues with breast cancer treatment       Follow-up: Return in about 6 months (around 01/31/2022) for  fasting.    Reinaldo Meeker, MD

## 2021-08-04 LAB — CBC WITH DIFFERENTIAL/PLATELET
Basophils Absolute: 0 10*3/uL (ref 0.0–0.2)
Basos: 0 %
EOS (ABSOLUTE): 0.2 10*3/uL (ref 0.0–0.4)
Eos: 3 %
Hematocrit: 50.5 % — ABNORMAL HIGH (ref 34.0–46.6)
Hemoglobin: 17.6 g/dL — ABNORMAL HIGH (ref 11.1–15.9)
Immature Grans (Abs): 0 10*3/uL (ref 0.0–0.1)
Immature Granulocytes: 0 %
Lymphocytes Absolute: 2.1 10*3/uL (ref 0.7–3.1)
Lymphs: 23 %
MCH: 31.8 pg (ref 26.6–33.0)
MCHC: 34.9 g/dL (ref 31.5–35.7)
MCV: 91 fL (ref 79–97)
Monocytes Absolute: 0.8 10*3/uL (ref 0.1–0.9)
Monocytes: 8 %
Neutrophils Absolute: 5.9 10*3/uL (ref 1.4–7.0)
Neutrophils: 66 %
Platelets: 267 10*3/uL (ref 150–450)
RBC: 5.54 x10E6/uL — ABNORMAL HIGH (ref 3.77–5.28)
RDW: 12.2 % (ref 11.7–15.4)
WBC: 9 10*3/uL (ref 3.4–10.8)

## 2021-08-04 LAB — COMPREHENSIVE METABOLIC PANEL
ALT: 66 IU/L — ABNORMAL HIGH (ref 0–32)
AST: 39 IU/L (ref 0–40)
Albumin/Globulin Ratio: 1.5 (ref 1.2–2.2)
Albumin: 4.5 g/dL (ref 3.8–4.9)
Alkaline Phosphatase: 131 IU/L — ABNORMAL HIGH (ref 44–121)
BUN/Creatinine Ratio: 18 (ref 9–23)
BUN: 19 mg/dL (ref 6–24)
Bilirubin Total: 1 mg/dL (ref 0.0–1.2)
CO2: 28 mmol/L (ref 20–29)
Calcium: 10.1 mg/dL (ref 8.7–10.2)
Chloride: 93 mmol/L — ABNORMAL LOW (ref 96–106)
Creatinine, Ser: 1.03 mg/dL — ABNORMAL HIGH (ref 0.57–1.00)
Globulin, Total: 3.1 g/dL (ref 1.5–4.5)
Glucose: 164 mg/dL — ABNORMAL HIGH (ref 65–99)
Potassium: 4 mmol/L (ref 3.5–5.2)
Sodium: 139 mmol/L (ref 134–144)
Total Protein: 7.6 g/dL (ref 6.0–8.5)
eGFR: 63 mL/min/{1.73_m2} (ref >59)

## 2021-08-04 LAB — LIPID PANEL
Chol/HDL Ratio: 4.2 ratio (ref 0.0–4.4)
Cholesterol, Total: 190 mg/dL (ref 100–199)
HDL: 45 mg/dL (ref >39)
LDL Chol Calc (NIH): 110 mg/dL — ABNORMAL HIGH (ref 0–99)
Triglycerides: 203 mg/dL — ABNORMAL HIGH (ref 0–149)
VLDL Cholesterol Cal: 35 mg/dL (ref 5–40)

## 2021-08-04 LAB — CARDIOVASCULAR RISK ASSESSMENT

## 2021-08-04 LAB — DIGOXIN LEVEL: Digoxin, Serum: 0.5 ng/mL (ref 0.5–0.9)

## 2021-08-05 NOTE — Progress Notes (Signed)
Digoxin o.5 normal, hemoglobin and hematocrit remains elevated suggest hematology consult for polycythemia, glucose 164, eGFR 63, creatinine 1.03 slightly high, alt elevated, triglycerides high 203, LDL cholesterol 110 high consider hanging from atorvastatin to crestor 36m. lp

## 2021-08-06 ENCOUNTER — Other Ambulatory Visit: Payer: Self-pay

## 2021-08-06 DIAGNOSIS — D751 Secondary polycythemia: Secondary | ICD-10-CM

## 2021-08-06 DIAGNOSIS — I2510 Atherosclerotic heart disease of native coronary artery without angina pectoris: Secondary | ICD-10-CM | POA: Diagnosis not present

## 2021-08-06 DIAGNOSIS — D0511 Intraductal carcinoma in situ of right breast: Secondary | ICD-10-CM | POA: Diagnosis not present

## 2021-08-06 DIAGNOSIS — N6011 Diffuse cystic mastopathy of right breast: Secondary | ICD-10-CM | POA: Diagnosis not present

## 2021-08-06 DIAGNOSIS — I10 Essential (primary) hypertension: Secondary | ICD-10-CM | POA: Diagnosis not present

## 2021-08-06 MED ORDER — ROSUVASTATIN CALCIUM 20 MG PO TABS: 20.0000 mg | ORAL_TABLET | Freq: Every day | ORAL | 0 refills | Status: DC

## 2021-08-08 ENCOUNTER — Telehealth: Payer: Self-pay | Admitting: Oncology

## 2021-08-08 NOTE — Telephone Encounter (Signed)
Patient referred by Dr Reinaldo Meeker for Polycythemia.  Appt made for 08/21/21 Labs 1:00 pm - Consult 1:30 pm

## 2021-08-08 NOTE — Telephone Encounter (Signed)
Patient has declined more testing.

## 2021-08-16 ENCOUNTER — Telehealth: Payer: Self-pay

## 2021-08-16 ENCOUNTER — Other Ambulatory Visit: Payer: Self-pay

## 2021-08-16 ENCOUNTER — Encounter (HOSPITAL_COMMUNITY): Payer: Self-pay | Admitting: Internal Medicine

## 2021-08-16 ENCOUNTER — Ambulatory Visit (HOSPITAL_COMMUNITY)
Admission: RE | Admit: 2021-08-16 | Discharge: 2021-08-16 | Disposition: A | Discharge status: Home / Self Care | Payer: HMO | Source: Ambulatory Visit | Attending: Internal Medicine | Admitting: Internal Medicine

## 2021-08-16 VITALS — BP 118/70 | HR 66 | Wt 234.6 lb

## 2021-08-16 DIAGNOSIS — C50911 Malignant neoplasm of unspecified site of right female breast: Secondary | ICD-10-CM | POA: Diagnosis not present

## 2021-08-16 DIAGNOSIS — Z79899 Other long term (current) drug therapy: Secondary | ICD-10-CM | POA: Diagnosis not present

## 2021-08-16 DIAGNOSIS — I110 Hypertensive heart disease with heart failure: Secondary | ICD-10-CM | POA: Insufficient documentation

## 2021-08-16 DIAGNOSIS — Z888 Allergy status to other drugs, medicaments and biological substances status: Secondary | ICD-10-CM | POA: Insufficient documentation

## 2021-08-16 DIAGNOSIS — C50919 Malignant neoplasm of unspecified site of unspecified female breast: Secondary | ICD-10-CM | POA: Diagnosis not present

## 2021-08-16 DIAGNOSIS — I2510 Atherosclerotic heart disease of native coronary artery without angina pectoris: Secondary | ICD-10-CM | POA: Diagnosis not present

## 2021-08-16 DIAGNOSIS — Z6839 Body mass index (BMI) 39.0-39.9, adult: Secondary | ICD-10-CM | POA: Insufficient documentation

## 2021-08-16 DIAGNOSIS — Z8249 Family history of ischemic heart disease and other diseases of the circulatory system: Secondary | ICD-10-CM | POA: Insufficient documentation

## 2021-08-16 DIAGNOSIS — Z87891 Personal history of nicotine dependence: Secondary | ICD-10-CM | POA: Insufficient documentation

## 2021-08-16 DIAGNOSIS — I255 Ischemic cardiomyopathy: Secondary | ICD-10-CM | POA: Insufficient documentation

## 2021-08-16 DIAGNOSIS — I5022 Chronic systolic (congestive) heart failure: Secondary | ICD-10-CM | POA: Insufficient documentation

## 2021-08-16 DIAGNOSIS — Z955 Presence of coronary angioplasty implant and graft: Secondary | ICD-10-CM | POA: Diagnosis not present

## 2021-08-16 DIAGNOSIS — R002 Palpitations: Secondary | ICD-10-CM | POA: Diagnosis not present

## 2021-08-16 DIAGNOSIS — I252 Old myocardial infarction: Secondary | ICD-10-CM | POA: Diagnosis not present

## 2021-08-16 DIAGNOSIS — Z7982 Long term (current) use of aspirin: Secondary | ICD-10-CM | POA: Diagnosis not present

## 2021-08-16 NOTE — Progress Notes (Signed)
Advanced Heart Failure Clinic Note  Date:  08/16/2021   ID:  Allison White, DOB [birth year], MRN [id number]  Location: Home  Provider location:  Advanced Heart Failure Clinic Type of Visit: Established patient  PCP:  Lillard Anes, MD  Cardiologist:  None Primary HF: Allison White  Chief Complaint: Heart Failure follow-up   History of Present Illness: Allison White is a 60 y.o. female with morbid obesity, HTN, carotid artery dissection with stenting of left carotid due to possible FMD, CAD s/p anterior MI 1/18 (DES to LAD), systolic HF with EF 01-09% s/p Biotronik ICD.   Seen in 5/21 with worsening NYHA IIIb symptoms. Had R/L cath with normal coronary arteries; widely patent LAD stent, EF 20-25%, Normal hemodynamics with CI 3.4. -> CPX  Last seen in the HF clinic 01/2021 ad was having orthostasis. Reds clip 30%. Recommended ted hose. Sleep study deferred at patient/husband request.   Had lumpectomy 9/1 and 08/06/21 for abnormal mammogram. Planning for radiation soon. Referred to hematology for polycythemia. .   Today she returns for HF follow up. Complaining of fatigue. SOB with exertion but this is her baseline. Denies PND/Orthopnea. No chest pain. Not as active as she once was. Appetite poor.  No fever or chills. Weight at home trending down 230-240 pounds. Taking all medications.   Cardiac Studies Echo 06/2017 EF 20-25% Echo 02/2018 LVEF 25-30%, Trivial MR, Normal RV, Mild TR, PA peak pressure 26 mm Hg Echo 8/20  EF 20-25% RV ok. Echo 12/21 EF 25%   CPX 8/21 FVC 3.08 (91%)      FEV1 2.30 (87%%)        FEV1/FVC 75 (94%)        MVV 89 (94%)  RER 1.10 Peak VO2: 15.3 (90% predicted peak VO2) - adjusted to iBW 25.19ml/kg/min VE/VCO2 slope:  33   Studies: R/L cath 04/07/20 Ao = 102/56 (75) LV = 104/13 RA = 3 RV = 27/4 PA = 28/9 (17) PCW = 7 Fick cardiac output/index = 7.2/3.4 PVR = 1.4 WU FA sat = 99% PA sat = 79%, 81% High SVC sat = 81%  Monitor  10/19 1. Sinus rhythm 2. Rare PVCs and bigeminy (< 1.0%) 3. Two patient-triggered events associated with isolated PVCs.  4. No high-grade arrhythmias     CPX 03/17/18 FVC 3.00 (87%), FEV1 2.97(88%), FEV1/FVC 79 (99%)  Peak VO2: 13.8 - When adjusted to the patient's ideal body weight of 142.2 lb (64.5 kg) the peak VO2 is 22.6 ml/kg VE/VCO2 slope: 38 OUES: 1.52 Peak RER: 1.11 Ventilatory Threshold: 11.8 VE/MVV:  95% O2pulse:  8 Interpretation: Mild limitation due to HF and obesity    Past Medical History:  Diagnosis Date   CHF (congestive heart failure) ([place])    FMD (facioscapulohumeral muscular dystrophy) (HCC)    Headache(784.0)    Heart failure (HCC)    Hyperlipidemia    MI (myocardial infarction) ([employer])    Migraine    Nonruptured cerebral aneurysm, internal carotid artery    Left side, stent placement (2009)   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    Vertigo    Past Surgical History:  Procedure Laterality Date   ABDOMINAL HYSTERECTOMY     CARDIAC DEFIBRILLATOR PLACEMENT     CEREBRAL ANEURYSM REPAIR Left 2009   CESAREAN SECTION     CORONARY ANGIOPLASTY WITH STENT PLACEMENT     PACEMAKER IMPLANT     RIGHT/LEFT HEART CATH AND CORONARY ANGIOGRAPHY N/A 04/07/2020   Procedure: RIGHT/LEFT HEART  CATH AND CORONARY ANGIOGRAPHY;  Surgeon: Jolaine Artist, MD;  Location: [facility];  Service: Cardiovascular;  Laterality: N/A;   TUBAL LIGATION       Current Outpatient Medications  Medication Sig Dispense Refill   aspirin 81 MG EC tablet Take 81 mg by mouth daily.      dapagliflozin propanediol (FARXIGA) 10 MG TABS tablet Take 1 tablet (10 mg total) by mouth daily. 30 tablet 2   digoxin (LANOXIN) 0.125 MG tablet TAKE 1 TABLET BY MOUTH ONCE DAILY. APPT REQUIRED FOR FUTURE REFILLS 30 tablet 0   Fremanezumab-vfrm (AJOVY) 225 MG/1.5ML SOAJ Inject 225 mg into the skin every 30 (thirty) days. 2 mL 0   HYDROcodone-acetaminophen (NORCO) 7.5-325 MG tablet Take 1 tablet by  mouth every 6 (six) hours as needed for moderate pain. 90 tablet 0   ivabradine (CORLANOR) 7.5 MG TABS tablet Take 1 tablet (7.5 mg total) by mouth 2 (two) times daily with a meal. 180 tablet 3   LORATADINE PO Take by mouth daily. [facility] brand     metoprolol succinate (TOPROL-XL) 50 MG 24 hr tablet Take 1 tablet by mouth twice daily 180 tablet 2   nitroGLYCERIN (NITROSTAT) 0.4 MG SL tablet Place 0.4 mg under the tongue every 5 (five) minutes as needed for chest pain.     rosuvastatin (CRESTOR) 20 MG tablet Take 1 tablet (20 mg total) by mouth daily. 90 tablet 0   spironolactone (ALDACTONE) 25 MG tablet Take 1 tablet (25 mg total) by mouth daily. 30 tablet 6   torsemide (DEMADEX) 100 MG tablet Take 50 mg by mouth daily.     No current facility-administered medications for this encounter.    Allergies:   Tape and Plavix [clopidogrel bisulfate]   Social History:  The patient  reports that she has quit smoking. Her smoking use included cigarettes. She has a 6.00 pack-year smoking history. She has never used smokeless tobacco. She reports current alcohol use. She reports that she does not use drugs.   Family History:  The patient's family history includes Cerebral aneurysm in her paternal grandmother; Dementia in her paternal grandfather; Hypertension in her brother; Prostate cancer in her father and paternal grandfather; Thyroid disease in her mother.   ROS:  Please see the history of present illness.   All other systems are personally reviewed and negative.   Body mass index is 39.04 kg/m.  Vitals:   08/16/21 1221  BP: 118/70  Pulse: 66  SpO2: 94%  Weight: 106.4 kg (234 lb 9.6 oz)   Wt Readings from Last 3 Encounters:  08/16/21 106.4 kg (234 lb 9.6 oz)  08/03/21 105.8 kg (233 lb 3.2 oz)  05/14/21 108.6 kg (239 lb 6.4 oz)    REDS CLip 33%.  Exam:   General:  Well appearing. No resp difficulty HEENT: normal Neck: supple. no JVD. Carotids 2+ bilat; no bruits. No lymphadenopathy  or thryomegaly appreciated. Cor: PMI nondisplaced. Regular rate & rhythm. No rubs, gallops or murmurs. Lungs: clear Abdomen:obese, soft, nontender, nondistended. No hepatosplenomegaly. No bruits or masses. Good bowel sounds. Extremities: no cyanosis, clubbing, rash, R and LLE varicosities trace edema Neuro: alert & orientedx3, cranial nerves grossly intact. moves all 4 extremities w/o difficulty. Affect pleasant    Recent Labs: 02/05/2021: B Natriuretic Peptide 114.3 08/03/2021: ALT 66; BUN 19; Creatinine, Ser 1.03; Hemoglobin 17.6; Platelets 267; Potassium 4.0; Sodium 139    Wt Readings from Last 3 Encounters:  08/16/21 106.4 kg (234 lb 9.6 oz)  08/03/21  105.8 kg (233 lb 3.2 oz)  05/14/21 108.6 kg (239 lb 6.4 oz)      ASSESSMENT AND PLAN:  1. Chronic systolic HF, ICM - Echo 01/2670 LVEF 25-30%, Trivial MR, Normal RV, Mild TR, PA peak pressure 26 mm Hg - s/p Biotronik ICD - Echo 07/13/19 EF 20-25% - Cath 5/21 EF 20-25% normal coronaries. Well compensated hemodynamics with CI 3.4 - Echo 12/21 EF 25% - CPX 8/21 very reassuring - Peak VO2: 15.3 (90% predicted peak VO2) - adjusted to iBW 25.52ml/kg/min. VEVCO2 33  - Reds Clip. Volume status stable.   Continue torsemide 50 daily  - Continue Entresto 24/26 bid for now (see above) - Continue spiro 25 mg daily.  - Continue toprol 50 mg BID - Continue digoxin 0.125 mg.  - Continue Farxiga 10 mg daily.  - Continue ivabradine 7.5 bid - I reviewed BMET from 08/03/21 , stable.   2. CAD s/p anterior MI 1/18 with DES to LAD in [place] - cath 5/21 normal cors with patent LAD stent -No chest pain.    3. Obesity Body mass index is 39.04 kg/m.   4. 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 No changes   6. Palpitations - Zio patch 10/19 was ok  - We discussed getting AliveCor device if these persist   - Referred for Sleep study but she cancelled due to cost. Husband said she doesn't snore.   7. Breast Cancer - s/p lumpectomy. Plan for radiation.  - no plans for chemo - if getting chemo will need close f/u with [place]   Signed, Darrick Grinder, NP  08/16/2021 12:32 PM  [facility] [place] [address] [place] [address] [phone] (office) [phone] (fax)  Patient seen and examined with the above-signed Advanced Practice Provider and/or Housestaff. I personally reviewed laboratory data, imaging studies and relevant notes. I independently examined the patient and formulated the important aspects of the plan. I have edited the note to reflect any of my changes or salient points. I have personally discussed the plan with the patient and/or family.  Stable from HF perspective NYHA II-III. Volume status stable.   Recently found to have breast CA. Underwent lumpectomy x 2. Planning XRT. No plans for chemo. Complaint with meds.   General:  Well appearing. No resp difficulty HEENT: normal Neck: supple. no JVD. Carotids 2+ bilat; no bruits. No lymphadenopathy or thryomegaly appreciated. Cor: PMI nondisplaced. Regular rate & rhythm. No rubs, gallops or murmurs. Lungs: clear Abdomen: obese soft, nontender, nondistended. No hepatosplenomegaly. No bruits or masses. Good bowel sounds. Extremities: no cyanosis, clubbing, rash, edema Neuro: alert & orientedx3, cranial nerves grossly intact. moves all 4 extremities w/o difficulty. Affect pleasant  Stable from HF perspective. Continue current meds. ICD interrogation. No AF or VT. Will repeat echo. If needs chemo will need very close surveillance of LV function.   Glori Bickers, MD  2:15 PM

## 2021-08-16 NOTE — Progress Notes (Signed)
ReDS Vest / Clip - 08/16/21 1200       ReDS Vest / Clip   Station Marker A    Ruler Value 31    ReDS Value Range Low volume    ReDS Actual Value 33

## 2021-08-16 NOTE — Patient Instructions (Signed)
Your physician has requested that you have an echocardiogram. Echocardiography is a painless test that uses sound waves to create images of your heart. It provides your doctor with information about the size and shape of your heart and how well your heart's chambers and valves are working. This procedure takes approximately one hour. There are no restrictions for this procedure.  Your physician recommends that you schedule a follow-up appointment in: 3-4 months  If you have any questions or concerns before your next appointment please send [place] a message through [place] or call our office at [phone].    TO LEAVE A MESSAGE FOR THE NURSE SELECT OPTION 2, PLEASE LEAVE A MESSAGE INCLUDING: YOUR NAME DATE OF BIRTH CALL BACK NUMBER REASON FOR CALL**this is important as we prioritize the call backs  YOU WILL RECEIVE A CALL BACK THE SAME DAY AS LONG AS YOU CALL BEFORE 4:00 PM  At the [facility], you and your health needs are our priority. As part of our continuing mission to provide you with exceptional heart care, we have created designated Provider Care Teams. These Care Teams include your primary Cardiologist (physician) and Advanced Practice Providers (APPs- Physician Assistants and Nurse Practitioners) who all work together to provide you with the care you need, when you need it.   You may see any of the following providers on your designated Care Team at your next follow up: Dr Glori Bickers Dr Loralie Champagne Dr Patrice Paradise, NP Lyda Jester, [place] Ginnie Smart Audry Riles, PharmD   Please be sure to bring in all your medications bottles to every appointment.

## 2021-08-17 DIAGNOSIS — D0511 Intraductal carcinoma in situ of right breast: Secondary | ICD-10-CM | POA: Diagnosis not present

## 2021-08-17 NOTE — Chronic Care Management (AMB) (Signed)
    Chronic Care Management Pharmacy Assistant   Name: Bari Leib  MRN: [id number] DOB: Apr 22, 1961  Reason for Encounter: Patient Assistance Coordination  08/16/2021-Following up on [place] patient assistance for patient, online application from [place] was once attempted, sent email link to patient for her to try and apply online to see if she can get assistance on medications. Will follow up with patient next month.  Pattricia Boss, [place] Pharmacist Assistant [phone]

## 2021-08-20 ENCOUNTER — Other Ambulatory Visit: Payer: Self-pay | Admitting: Oncology

## 2021-08-20 ENCOUNTER — Telehealth: Payer: Self-pay | Admitting: Oncology

## 2021-08-20 DIAGNOSIS — D45 Polycythemia vera: Secondary | ICD-10-CM

## 2021-08-20 NOTE — Progress Notes (Signed)
[facility]  [address] [place],  [place]  [address] [phone]  Clinic Day:  08/21/2021  Referring physician: Lillard Anes,*   HISTORY OF PRESENT ILLNESS:  The patient is a 59 y.o. female  who I was asked to consult upon for newly diagnosed breast cancer, as well as polycythemia.  Her history dates back to this past summer when she underwent a mammogram upon the behest of her primary care provider.  This study unexpectedly showed a mass in her right breast, that was further confirmed by a diagnostic mammogram and ultrasound.  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   A lumpectomy in September 2022 revealed a 2.1 cm ductal carcinoma in situ that was estrogen and progesterone receptor positive.  There was also a small component of lobular carcinoma in situ.  As a margin was positive for DCIS, a second surgery was done a few weeks later, but the pathology fortunately showed no residual disease.  The patient denied ever having any breast symptoms/findings which alerted her to breast cancer being present.    With respect to her polycythemia, a CBC in September 2022 revealed an elevated hemoglobin of 17.6, an elevated hematocrit of 50.5, and an elevated red blood cell count of 5.54 million. The patient is currently taking 2 diuretics, torsemide and spironolactone, for congestive heart failure.  She denies being on any type of anabolic steroids.  She denies a history of smoking.  She denies having undergone a previous splenectomy.  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[facility])    both  carotids    Vertigo     PAST SURGICAL HISTORY:   Past Surgical History:  Procedure Laterality Date   ABDOMINAL HYSTERECTOMY     CARDIAC DEFIBRILLATOR PLACEMENT     CEREBRAL ANEURYSM REPAIR Left 2009   CESAREAN SECTION     CORONARY ANGIOPLASTY WITH STENT PLACEMENT     PACEMAKER IMPLANT     RIGHT/LEFT HEART CATH AND CORONARY ANGIOGRAPHY N/A 04/07/2020   Procedure: RIGHT/LEFT HEART CATH AND CORONARY ANGIOGRAPHY;  Surgeon: Jolaine Artist, MD;  Location: [facility];  Service: Cardiovascular;  Laterality: N/A;   TUBAL LIGATION      CURRENT MEDICATIONS:   Current Outpatient Medications  Medication Sig Dispense Refill   aspirin 81 MG EC tablet Take 81 mg by mouth daily.      dapagliflozin propanediol (FARXIGA) 10 MG TABS tablet Take 1 tablet (10 mg total) by mouth daily. 30 tablet 2   digoxin (LANOXIN) 0.125 MG tablet TAKE 1 TABLET BY MOUTH ONCE DAILY. APPT REQUIRED FOR FUTURE REFILLS 30 tablet 0   Fremanezumab-vfrm (AJOVY) 225 MG/1.5ML SOAJ Inject 225 mg into the skin every 30 (thirty) days. 2 mL 0   HYDROcodone-acetaminophen (NORCO) 7.5-325 MG tablet Take 1 tablet by mouth every 6 (six) hours as needed for moderate pain. 90 tablet 0   ivabradine (CORLANOR) 7.5 MG TABS tablet Take 1 tablet (7.5 mg total) by mouth 2 (two) times daily with a meal. 180 tablet 3   LORATADINE PO Take by mouth daily. [facility] brand  metoprolol succinate (TOPROL-XL) 50 MG 24 hr tablet Take 1 tablet by mouth twice daily 180 tablet 2   nitroGLYCERIN (NITROSTAT) 0.4 MG SL tablet Place 0.4 mg under the tongue every 5 (five) minutes as needed for chest pain.     rosuvastatin (CRESTOR) 20 MG tablet Take 1 tablet (20 mg total) by mouth daily. 90 tablet 0   spironolactone (ALDACTONE) 25 MG tablet Take 1 tablet (25 mg total) by mouth daily. 30 tablet 6   torsemide (DEMADEX) 100 MG tablet Take 50 mg by mouth daily.     No current facility-administered medications for this visit.    ALLERGIES:    Allergies  Allergen Reactions   Tape Rash    PREFERS CLOTH OR NOTHING   Plavix [Clopidogrel Bisulfate] Other (See Comments)    Joint pain    FAMILY HISTORY:   Family History  Problem Relation Age of Onset   Thyroid disease Mother    Prostate cancer Father        Prostate   Cerebral aneurysm Paternal Grandmother        Nonruptured   Dementia Paternal Grandfather    Prostate cancer Paternal Grandfather    Hypertension Brother    Breast cancer Neg Hx     SOCIAL HISTORY:  She was born in [place].  She lives in [place] with her husband of 49 years.  She has 4 children and 12 grandchildren.  She has been a Psychologist, occupational for over 20 years.  She smoked briefly as a teenager.  She drinks alcohol on rare occasions.   REVIEW OF SYSTEMS:  Review of Systems  Constitutional:  Positive for fatigue. Negative for fever.  HENT:   Negative for hearing loss and sore throat.   Eyes:  Negative for eye problems.  Respiratory:  Positive for shortness of breath. Negative for chest tightness, cough and hemoptysis.   Cardiovascular:  Positive for leg swelling and palpitations. Negative for chest pain.  Gastrointestinal:  Positive for constipation. Negative for abdominal distention, abdominal pain, blood in stool, diarrhea, nausea and vomiting.  Endocrine: Negative for hot flashes.  Genitourinary:  Positive for dysuria. Negative for difficulty urinating, frequency, hematuria and nocturia.   Musculoskeletal:  Positive for arthralgias. Negative for back pain, gait problem and myalgias.  Skin: Negative.  Negative for itching and rash.  Neurological:  Positive for dizziness and headaches. Negative for extremity weakness, gait problem, light-headedness and numbness.  Hematological: Negative.   Psychiatric/Behavioral: Negative.  Negative for depression and suicidal ideas. The patient is not nervous/anxious.     PHYSICAL EXAM:  Blood pressure 125/84, pulse 69, temperature 98.2 F (36.8 C),  resp. rate 16, height 5\' 5"  (1.651 m), weight 231 lb 14.4 oz (105.2 kg), SpO2 95 %. Wt Readings from Last 3 Encounters:  08/21/21 231 lb 14.4 oz (105.2 kg)  08/16/21 234 lb 9.6 oz (106.4 kg)  08/03/21 233 lb 3.2 oz (105.8 kg)   Body mass index is 38.59 kg/m. Performance status (ECOG): 0 - Asymptomatic Physical Exam Constitutional:      Appearance: Normal appearance.  HENT:     Mouth/Throat:     Pharynx: Oropharynx is clear. No oropharyngeal exudate.  Cardiovascular:     Rate and Rhythm: Normal rate and regular rhythm.     Heart sounds: No murmur heard.   No friction rub. No gallop.  Pulmonary:     Breath sounds: Normal breath sounds.  Chest:  Breasts:    Right: No swelling, bleeding, inverted nipple, mass, nipple discharge or  skin change.     Left: No swelling, bleeding, inverted nipple, mass, nipple discharge or skin change.  Abdominal:     General: Bowel sounds are normal. There is no distension.     Palpations: Abdomen is soft. There is no mass.     Tenderness: There is no abdominal tenderness.  Musculoskeletal:        General: No tenderness.     Cervical back: Normal range of motion and neck supple.     Right lower leg: No edema.     Left lower leg: No edema.  Lymphadenopathy:     Cervical: No cervical adenopathy.     Right cervical: No superficial, deep or posterior cervical adenopathy.    Left cervical: No superficial, deep or posterior cervical adenopathy.     Upper Body:     Right upper body: No supraclavicular or axillary adenopathy.     Left upper body: No supraclavicular or axillary adenopathy.     Lower Body: No right inguinal adenopathy. No left inguinal adenopathy.  Skin:    Coloration: Skin is not jaundiced.     Findings: No lesion or rash.  Neurological:     General: No focal deficit present.     Mental Status: She is alert and oriented to person, place, and time. Mental status is at baseline.  Psychiatric:        Mood and Affect: Mood normal.         Behavior: Behavior normal.        Thought Content: Thought content normal.        Judgment: Judgment normal.    LABS:   CBC Latest Ref Rng & Units 08/21/2021 08/03/2021 01/01/2021  WBC - 10.1 9.0 8.5  Hemoglobin 12.0 - 16.0 17.7(A) 17.6(H) 17.1(H)  Hematocrit 36 - 46 52(A) 50.5(H) 50.2(H)  Platelets 150 - 399 236 267 275   CMP Latest Ref Rng & Units 08/03/2021 05/08/2021 02/05/2021  Glucose 65 - 99 mg/dL 164(H) 121(H) 127(H)  BUN 6 - 24 mg/dL 19 16 18   Creatinine 0.57 - 1.00 mg/dL 1.03(H) 0.89 0.93  Sodium 134 - 144 mmol/L 139 137 140  Potassium 3.5 - 5.2 mmol/L 4.0 3.7 4.3  Chloride 96 - 106 mmol/L 93(L) 93(L) 97(L)  CO2 20 - 29 mmol/L 28 34(H) 31  Calcium 8.7 - 10.2 mg/dL 10.1 9.8 10.0  Total Protein 6.0 - 8.5 g/dL 7.6 - -  Total Bilirubin 0.0 - 1.2 mg/dL 1.0 - -  Alkaline Phos 44 - 121 IU/L 131(H) - -  AST 0 - 40 IU/L 39 - -  ALT 0 - 32 IU/L 66(H) - -    ASSESSMENT & PLAN:  A 60 y.o. female who I was asked to consult upon for stage 0 (Tis N0 M0) hormone positive breast cancer, also known as ductal carcinoma in situ.  She also has polycythemia.  With respect to her DCIS, there is no role for adjuvant chemotherapy.  As it is hormone sensitive, I will place her on tamoxifen, which she will take for 5 years of chemoprevention.  I will also have her see radiation oncology so they can plan her adjuvant breast radiation.  Moving forward, her breast cancer surveillance will consist of clinical breast exams every 6 months and annual mammograms.   With respect to her polycythemia, her hemoglobin is not dangerously elevated.  I will check her iron parameters; if elevated, it would raise the suspicion for hemochromatosis being present.  I will also have her undergo JAK2  mutation testing to rule out polycythemia rubra vera being present.  If seen, phlebotomies would be implemented to get her hematocrit below 45.  I will notify her of this test result as soon as it becomes available.   I will see her  back in 6 months for repeat clinical assessment.  The patient understands all the plans discussed today and is in agreement with them.  I do appreciate Lillard Anes,* for his new consult.   Brittay Mogle Macarthur Critchley, MD

## 2021-08-20 NOTE — Telephone Encounter (Signed)
Per Ivin Booty at Dr Orrin Brigham - patient also has been diagnosed w/Breast CA (Ductal Carcinoma in situ - Right Breast).  Added to 10/4 Appt - Labs 1:00 pm - Consult 1:30 pm

## 2021-08-21 ENCOUNTER — Other Ambulatory Visit: Payer: Self-pay | Admitting: Hematology and Oncology

## 2021-08-21 ENCOUNTER — Inpatient Hospital Stay: Payer: HMO | Attending: Oncology

## 2021-08-21 ENCOUNTER — Telehealth: Payer: Self-pay | Admitting: Oncology

## 2021-08-21 ENCOUNTER — Inpatient Hospital Stay (INDEPENDENT_AMBULATORY_CARE_PROVIDER_SITE_OTHER): Payer: HMO | Admitting: Oncology

## 2021-08-21 ENCOUNTER — Other Ambulatory Visit: Payer: Self-pay | Admitting: Oncology

## 2021-08-21 VITALS — BP 125/84 | HR 69 | Temp 98.2°F | Resp 16 | Ht 65.0 in | Wt 231.9 lb

## 2021-08-21 DIAGNOSIS — Z170 Estrogen receptor positive status [ER+]: Secondary | ICD-10-CM | POA: Diagnosis not present

## 2021-08-21 DIAGNOSIS — D751 Secondary polycythemia: Secondary | ICD-10-CM | POA: Insufficient documentation

## 2021-08-21 DIAGNOSIS — C50411 Malignant neoplasm of upper-outer quadrant of right female breast: Secondary | ICD-10-CM

## 2021-08-21 DIAGNOSIS — Z7984 Long term (current) use of oral hypoglycemic drugs: Secondary | ICD-10-CM

## 2021-08-21 DIAGNOSIS — Z8349 Family history of other endocrine, nutritional and metabolic diseases: Secondary | ICD-10-CM

## 2021-08-21 DIAGNOSIS — Z87891 Personal history of nicotine dependence: Secondary | ICD-10-CM | POA: Diagnosis not present

## 2021-08-21 DIAGNOSIS — Z8042 Family history of malignant neoplasm of prostate: Secondary | ICD-10-CM | POA: Insufficient documentation

## 2021-08-21 DIAGNOSIS — Z8249 Family history of ischemic heart disease and other diseases of the circulatory system: Secondary | ICD-10-CM | POA: Insufficient documentation

## 2021-08-21 DIAGNOSIS — D0511 Intraductal carcinoma in situ of right breast: Secondary | ICD-10-CM | POA: Insufficient documentation

## 2021-08-21 DIAGNOSIS — Z79810 Long term (current) use of selective estrogen receptor modulators (SERMs): Secondary | ICD-10-CM | POA: Insufficient documentation

## 2021-08-21 DIAGNOSIS — Z79899 Other long term (current) drug therapy: Secondary | ICD-10-CM | POA: Diagnosis not present

## 2021-08-21 DIAGNOSIS — Z7982 Long term (current) use of aspirin: Secondary | ICD-10-CM

## 2021-08-21 DIAGNOSIS — D45 Polycythemia vera: Secondary | ICD-10-CM

## 2021-08-21 DIAGNOSIS — D649 Anemia, unspecified: Secondary | ICD-10-CM | POA: Diagnosis not present

## 2021-08-21 LAB — IRON AND TIBC
Iron: 122 ug/dL (ref 28–170)
Saturation Ratios: 23 % (ref 10.4–31.8)
TIBC: 530 ug/dL — ABNORMAL HIGH (ref 250–450)
UIBC: 408 ug/dL

## 2021-08-21 LAB — CBC
MCV: 93 (ref 81–99)
RBC: 5.56 — AB (ref 3.87–5.11)

## 2021-08-21 LAB — CBC AND DIFFERENTIAL
HCT: 52 — AB (ref 36–46)
Hemoglobin: 17.7 — AB (ref 12.0–16.0)
Neutrophils Absolute: 6.36
Platelets: 236 (ref 150–399)
WBC: 10.1

## 2021-08-21 LAB — FERRITIN: Ferritin: 42 ng/mL (ref 11–307)

## 2021-08-21 NOTE — Telephone Encounter (Signed)
Per 10/4 LOS, patient scheduled for April 2023 Appt's.  Gave patient Appt Summary/Calendar

## 2021-08-23 DIAGNOSIS — D0511 Intraductal carcinoma in situ of right breast: Secondary | ICD-10-CM | POA: Diagnosis not present

## 2021-08-27 ENCOUNTER — Other Ambulatory Visit: Payer: Self-pay | Admitting: Oncology

## 2021-08-27 DIAGNOSIS — Z510 Encounter for antineoplastic radiation therapy: Secondary | ICD-10-CM | POA: Diagnosis not present

## 2021-08-27 DIAGNOSIS — D0511 Intraductal carcinoma in situ of right breast: Secondary | ICD-10-CM | POA: Diagnosis not present

## 2021-08-27 MED ORDER — TAMOXIFEN CITRATE 20 MG PO TABS: 20.0000 mg | ORAL_TABLET | Freq: Every day | ORAL | 3 refills | Status: DC

## 2021-08-29 ENCOUNTER — Ambulatory Visit (INDEPENDENT_AMBULATORY_CARE_PROVIDER_SITE_OTHER): Payer: HMO

## 2021-08-29 DIAGNOSIS — D0511 Intraductal carcinoma in situ of right breast: Secondary | ICD-10-CM | POA: Diagnosis not present

## 2021-08-29 DIAGNOSIS — Z510 Encounter for antineoplastic radiation therapy: Secondary | ICD-10-CM | POA: Diagnosis not present

## 2021-08-29 NOTE — Progress Notes (Signed)
Chronic Care Management Pharmacy Note  08/29/2021 Name:  Allison White MRN:  [id number] DOB:  1961/07/24  Summary: Patient started on Tamoxifen and had multiple questions regarding this medication. Counseled on pill and documented start date and treatment time frame in the note for future reference    Subjective: Allison White is an 60 y.o. year old female who is a primary patient of Henrene Pastor, Zeb Comfort, MD.  The CCM team was consulted for assistance with disease management and care coordination needs.    Engaged with patient face to face for follow up visit in response to provider referral for pharmacy case management and/or care coordination services.   Consent to Services:  The patient was given the following information about Chronic Care Management services today, agreed to services, and gave verbal consent: 1. CCM service includes personalized support from designated clinical staff supervised by the primary care provider, including individualized plan of care and coordination with other care providers 2. 24/7 contact phone numbers for assistance for urgent and routine care needs. 3. Service will only be billed when office clinical staff spend 20 minutes or more in a month to coordinate care. 4. Only one practitioner may furnish and bill the service in a calendar month. 5.The patient may stop CCM services at any time (effective at the end of the month) by phone call to the office staff. 6. The patient will be responsible for cost sharing (co-pay) of up to 20% of the service fee (after annual deductible is met). Patient agreed to services and consent obtained.  Patient Care Team: Lillard Anes, MD as PCP - General (Family Medicine) Constance Haw, MD as PCP - Electrophysiology (Cardiology) Bensimhon, Shaune Pascal, MD as PCP - Advanced Heart Failure (Cardiology) Burnice Logan, [facility] (Inactive) as Pharmacist (Pharmacist)  Recent office visits:  04/03/21- Reinaldo Meeker, MD  - seen for follow up of hypertension, started valsartan 40 mg daily, referral to gastroenterology, labs ordered, mammogram ordered, follow up 4 months 01/01/21- Reinaldo Meeker, MD- seen for chronic visit, labs ordered, no medication changes, follow up 3 months   Recent consult visits:  06/14/21- Patient Message- Allena Katz, [place] (Cardiology) increased corlanor back to 7.5 White to increased HR 06/01/21- Patient Message- Glori Bickers, MD (Cardiology) discontinued valsartan White to  BP irregularity 05/22/21- Telephone encounter- Fransico Him, MD (Cardiology) ordered PSG after normal sleep study 05/14/21- Debbora Presto, NP (Neurology)- seen for migraine follow up, started Ajovy 225 mg/ 1.5 ml one self administered injection every 30 days, follow up 3 months 05/07/21- Will Meredith Leeds, MD (Cardiology)- seen for  evaluation of systolic heart failure, no medication changes, follow up 1 yr  02/20/21-Patient Message- Glori Bickers, MD (Cardiology) discontinued entresto and started losartan 25 mg daily at bedtime White to low BP 02/05/21- Glori Bickers, MD (Cardiology)-  seen for heart failure follow up, labs ordered, no medication changes, follow up 3 months  01/15/21- Sarina Ill, MD (Neurology)- seen for chronic daily headaches, started emgality 120 mg/ml  every 30 days, started ubrelvy 100 mg every 2 hours prn max dose 200 mg/ day, CT performed, follow up 4 month   Hospital visits:  None in previous 6 months   Objective:  Lab Results  Component Value Date   CREATININE 1.03 (H) 08/03/2021   BUN 19 08/03/2021   GFR 71.82 07/22/2013   GFRNONAA >60 05/08/2021   GFRAA 70 01/01/2021   NA 139 08/03/2021   K 4.0 08/03/2021   CALCIUM 10.1 08/03/2021   CO2 28  08/03/2021   GLUCOSE 164 (H) 08/03/2021    Lab Results  Component Value Date/Time   HGBA1C 6.1 10/25/2020 12:00 AM   GFR 71.82 07/22/2013 03:28 PM    Last diabetic Eye exam:  Lab Results  Component Value Date/Time    HMDIABEYEEXA No Retinopathy 10/25/2020 12:00 AM    Last diabetic Foot exam:  Lab Results  Component Value Date/Time   HMDIABFOOTEX Negative 10/25/2020 12:00 AM     Lab Results  Component Value Date   CHOL 190 08/03/2021   HDL 45 08/03/2021   LDLCALC 110 (H) 08/03/2021   TRIG 203 (H) 08/03/2021   CHOLHDL 4.2 08/03/2021    Hepatic Function Latest Ref Rng & Units 08/03/2021 01/01/2021 07/17/2020  Total Protein 6.0 - 8.5 g/dL 7.6 7.6 7.1  Albumin 3.8 - 4.9 g/dL 4.5 4.7 4.5  AST 0 - 40 IU/L 39 31 28  ALT 0 - 32 IU/L 66(H) 46(H) 33(H)  Alk Phosphatase 44 - 121 IU/L 131(H) 126(H) 118  Total Bilirubin 0.0 - 1.2 mg/dL 1.0 0.8 0.6    Lab Results  Component Value Date/Time   TSH 2.880 07/17/2020 10:11 AM    CBC Latest Ref Rng & Units 08/21/2021 08/03/2021 01/01/2021  WBC - 10.1 9.0 8.5  Hemoglobin 12.0 - 16.0 17.7(A) 17.6(H) 17.1(H)  Hematocrit 36 - 46 52(A) 50.5(H) 50.2(H)  Platelets 150 - 399 236 267 275    No results found for: VD25OH  Clinical ASCVD: No  The 10-year ASCVD risk score (Arnett DK, et al., 2019) is: 8%   Values used to calculate the score:     Age: 37 years     Sex: Female     Is Non-Hispanic African American: No     Diabetic: Yes     Tobacco smoker: No     Systolic Blood Pressure: 062 mmHg     Is BP treated: Yes     HDL Cholesterol: 45 mg/dL     Total Cholesterol: 190 mg/dL    Depression screen [facility] 2/9 08/03/2021 10/26/2020 07/17/2020  Decreased Interest 0 0 0  Down, Depressed, Hopeless 0 0 0  PHQ - 2 Score 0 0 0  Altered sleeping - - 2  Tired, decreased energy - - 3  Change in appetite - - 3  Feeling bad or failure about yourself  - - -  Trouble concentrating - - 0  Moving slowly or fidgety/restless - - 0  Suicidal thoughts - - 0  PHQ-9 Score - - 8  Difficult doing work/chores - - Very difficult     Other: (CHADS2VASc if Afib, MMRC or CAT for COPD, ACT, DEXA)  Social History   Tobacco Use  Smoking Status Former   Packs/day: 1.00   Years: 6.00    Pack years: 6.00   Types: Cigarettes  Smokeless Tobacco Never  Tobacco Comments   Quit over 30 years ago   BP Readings from Last 3 Encounters:  08/21/21 125/84  08/16/21 118/70  08/03/21 130/88   Pulse Readings from Last 3 Encounters:  08/21/21 69  08/16/21 66  08/03/21 73   Wt Readings from Last 3 Encounters:  08/21/21 231 lb 14.4 oz (105.2 kg)  08/16/21 234 lb 9.6 oz (106.4 kg)  08/03/21 233 lb 3.2 oz (105.8 kg)   BMI Readings from Last 3 Encounters:  08/21/21 38.59 kg/m  08/16/21 39.04 kg/m  08/03/21 38.81 kg/m    Assessment/Interventions: Review of patient past medical history, allergies, medications, health status, including review of consultants reports, laboratory  and other test data, was performed as part of comprehensive evaluation and provision of chronic care management services.   SDOH:  (Social Determinants of Health) assessments and interventions performed: Yes SDOH Interventions    Flowsheet Row Most Recent Value  SDOH Interventions   Financial Strain Interventions Other (Comment)  [PAP of some meds]      SDOH Screenings   Alcohol Screen: Not on file  Depression (PHQ2-9): Low Risk    PHQ-2 Score: 0  Financial Resource Strain: High Risk   Difficulty of Paying Living Expenses: Hard  Food Insecurity: Not on file  Housing: Low Risk    Last Housing Risk Score: 0  Physical Activity: Not on file  Social Connections: Moderately Integrated   Frequency of Communication with Friends and Family: More than three times a week   Frequency of Social Gatherings with Friends and Family: Twice a week   Attends Religious Services: 1 to 4 times per year   Active Member of [employer] or Organizations: No   Attends Archivist Meetings: Never   Marital Status: Married  Stress: Not on file  Tobacco Use: Medium Risk   Smoking Tobacco Use: Former   Smokeless Tobacco Use: Never  Transportation Needs: No Data processing manager  (Medical): No   Lack of Transportation (Non-Medical): No    CCM Care Plan  Allergies  Allergen Reactions   Tape Rash    PREFERS CLOTH OR NOTHING   Plavix [Clopidogrel Bisulfate] Other (See Comments)    Joint pain    Medications Reviewed Today     Reviewed by Stanford Scotland, RN (Registered Nurse) on 08/16/21 at 1221  Med List Status: <None>   Medication Order Taking? Sig Documenting Provider Last Dose Status Informant  aspirin 81 MG EC tablet [id number] Yes Take 81 mg by mouth daily.  [provider] Taking Active Self  dapagliflozin propanediol (FARXIGA) 10 MG TABS tablet [id number] Yes Take 1 tablet (10 mg total) by mouth daily. Bensimhon, Shaune Pascal, MD Taking Active   digoxin (LANOXIN) 0.125 MG tablet [id number] Yes TAKE 1 TABLET BY MOUTH ONCE DAILY. APPT REQUIRED FOR FUTURE REFILLS Bensimhon, Shaune Pascal, MD Taking Active   Fremanezumab-vfrm (AJOVY) 225 MG/1.5ML Darden Palmer [id number] Yes Inject 225 mg into the skin every 30 (thirty) days. Lomax, Amy, NP Taking Active   HYDROcodone-acetaminophen (NORCO) 7.5-325 MG tablet [id number] Yes Take 1 tablet by mouth every 6 (six) hours as needed for moderate pain. Lillard Anes, MD Taking Active   ivabradine [facility]) 7.5 MG TABS tablet [id number] Yes Take 1 tablet (7.5 mg total) by mouth 2 (two) times daily with a meal. Rafael Bihari, [place] Taking Active   LORATADINE PO [id number] Yes Take by mouth daily. [facility] brand [provider] Taking Active Self  metoprolol succinate (TOPROL-XL) 50 MG 24 hr tablet [id number] Yes Take 1 tablet by mouth twice daily Bensimhon, Shaune Pascal, MD Taking Active   nitroGLYCERIN (NITROSTAT) 0.4 MG SL tablet [id number] Yes Place 0.4 mg under the tongue every 5 (five) minutes as needed for chest pain. [provider] Taking Active   rosuvastatin (CRESTOR) 20 MG tablet [id number] Yes Take 1 tablet (20 mg total) by mouth daily. Lillard Anes, MD Taking Active   spironolactone  (ALDACTONE) 25 MG tablet [id number] Yes Take 1 tablet (25 mg total) by mouth daily. Lillard Anes, MD Taking Active   torsemide [facility]) 100 MG tablet [id number] Yes Take 50 mg by mouth daily. [provider] Taking Active  Patient Active Problem List   Diagnosis Date Noted   Breast cancer ([place]) 08/03/2021   Chronic migraine without aura, with intractable migraine, so stated, with status migrainosus 01/15/2021   Migraine with aura and without status migrainosus, not intractable 01/15/2021   BMI 40.0-44.9, adult ([place]) 01/01/2021   Routine general medical examination at a health care facility 10/26/2020   Chronic pain 04/12/2020   Pacemaker 04/12/2020   Morbid obesity ([place]) 04/10/2018   Myalgia 09/22/2017   Cardiomyopathy ([place]) 07/31/2017   Presence of automatic (implantable) cardiac defibrillator 02/25/2017   Decreased cardiac ejection fraction 01/23/2017   Fibromuscular dysplasia ([place]) 08/67/6195   Chronic systolic congestive heart failure ([place]) 12/05/2016   Hypertensive heart disease with heart failure ([place]) 12/05/2016   Pleural effusion 12/01/2016   Intractable migraine without aura and with status migrainosus 01/31/2015   Chronic daily headache 07/23/2013   Carotid aneurysm, left ([place]) 07/23/2013   Vertigo    Headache(784.0)    Hyperlipidemia     Immunization History  Administered Date(s) Administered   Influenza Inj Mdck Quad Pf 10/26/2020   PFIZER(Purple Top)SARS-COV-2 Vaccination 02/21/2020, 03/20/2020   Pneumococcal Polysaccharide-23 09/08/2019    Conditions to be addressed/monitored:  Hypertension, Hyperlipidemia, Heart Failure, and migraine  Care Plan : [place]  Updates made by Lane Hacker, RPH since 08/29/2021 12:00 AM     Problem: htn, hld, heart failure   Priority: High  Onset Date: 07/06/2021     Long-Range Goal: Disease State Management   Start Date: 07/06/2021  Expected End Date: 07/06/2022  Recent  Progress: On track  Priority: High  Note:   Current Barriers:  Unable to independently afford treatment regimen  Pharmacist Clinical Goal(s):  Patient will verbalize ability to afford treatment regimen through collaboration with PharmD and provider.   Interventions: 1:1 collaboration with Lillard Anes, MD regarding development and update of comprehensive plan of care as evidenced by provider attestation and co-signature Inter-disciplinary care team collaboration (see longitudinal plan of care) Comprehensive medication review performed; medication list updated in electronic medical record  (BP goal <130/80) -Controlled -Current treatment: Metoprolol succinate 50 mg twice daily  Spironolactone 25 mg daily  -Medications previously tried: valsartan -Current home readings: well controlled - patient felt washed out when too low on Entresto -Current dietary habits: eats small portions but tries to keep healthy diet -Current exercise habits: limited  -Denies hypotensive/hypertensive symptoms -Educated on BP goals and benefits of medications for prevention of heart attack, stroke and kidney damage; Daily salt intake goal < 2300 mg; Exercise goal of 150 minutes per week; -Counseled to monitor BP at home weekly, document, and provide log at future appointments -Recommended to continue current medication  Hyperlipidemia: (LDL goal < 55) -Not ideally controlled -Current treatment: atorvastatin 20 mg daily  Aspirin ec 81 mg daily  -Medications previously tried: none reported  -Current dietary patterns: little appetite but patient is trying to eat healthy -Current exercise habits: limited -Educated on Cholesterol goals;  Benefits of statin for ASCVD risk reduction; Exercise goal of 150 minutes per week; -Recommended considering increase in statin dose if upcoming LDL above goal.   Heart Failure (Goal: manage symptoms and prevent exacerbations) -Controlled -Last ejection  fraction: 25%  (Date: 10/2020) -HF type: Systolic -NYHA Class: III (marked limitation of activity) -Current treatment: Farxiga 10 mg daily  Digoxin 0.125 mg daily  Corlanolr 7.5 mg bid with a meal Metoprolol Succinate 50 mg bid  Nitroglcyerin 0.4 mg every 5 minutes prn chest pain  Torsemide  50 mg daily  Spironolactone 25 mg daily  -Medications previously tried: Entresto, valsartan -Current home BP/HR readings: patient reports bp runs low -Current dietary habits: very little appetite -Current exercise habits: limited  -Educated on Benefits of medications for managing symptoms and prolonging life Importance of weighing daily; if you gain more than 3 pounds in one day or 5 pounds in one week,  -Recommended to continue current medication  Migraine (Goal: prevent/manage migraines) -Not ideally controlled -Current treatment  Ajovy 225 mg every 30 days - (PAP) Hydrocodone 7.5/325 mg every 6 hours prn  -Medications previously tried: none reported  -Recommended to continue current medication  Breast Cancer -Managed by Lavera Guise -Lumpectomy September 2022 -Current treatment  Tamoxifen 37m (5 year treatment plan per specialist, started 08/27/21) -Radiation starting mid October 2022 -Recommended to continue current medication  Health Maintenance -Vaccine gaps: tdap, shingrix, COVID booster (finding out timing White to upcoming breast surgery) -Current therapy:   -Collaborated with surgeon to find out recommendation for COVID vaccine.   Patient Goals/Self-Care Activities Patient will:  - take medications as prescribed focus on medication adherence by using pill box target a minimum of 150 minutes of moderate intensity exercise weekly engage in dietary modifications by **  Follow Up Plan: Telephone follow up appointment with care management team member scheduled for: 6 months  CMA F/U Monthly until Cx treatment completed  NArizona Constable Pharm.D. - [phone]         Medication Assistance:  CNapavineand FWilder Gladeobtained through patietn assistance with Cardiologist medication assistance program.  Enrollment ends 11/17/2021. Checking on affordability options for Ajovy.   Compliance/Adherence/Medication fill history:   Care Gaps: Last annual wellness visit? 063/33/54If applicable: Last eye exam / retinopathy screening? Never completed  Last diabetic foot exam? 04/03/21  Star Rating Drugs:  atorvastatin 20 MG -90 DS last filled 04/29/21 dapagliflozin propanediol  10 MG - 5 DS last filled 02/23/21 valsartan 40 MG - Discontinued by provider  [facility]  [facility]  [id number]: [phone]: [address] [address] [place]Phone: [phone]: [phone] Uses pill box? Yes Pt endorses good compliance  We discussed: Benefits of medication synchronization, packaging and delivery as well as enhanced pharmacist oversight with [employer]. Patient decided to: Continue current medication management strategy  Care Plan and Follow Up Patient Decision:  Patient agrees to Care Plan and Follow-up.  Plan: Telephone follow up appointment with care management team member scheduled for:  6 months

## 2021-08-29 NOTE — Patient Instructions (Signed)
Visit Information   Goals Addressed   None    Patient Care Plan: CCM Pharmacy Care Plan     Problem Identified: htn, hld, heart failure   Priority: High  Onset Date: 07/06/2021     Long-Range Goal: Disease State Management   Start Date: 07/06/2021  Expected End Date: 07/06/2022  Recent Progress: On track  Priority: High  Note:   Current Barriers:  Unable to independently afford treatment regimen  Pharmacist Clinical Goal(s):  Patient will verbalize ability to afford treatment regimen through collaboration with PharmD and provider.   Interventions: 1:1 collaboration with Lillard Anes, MD regarding development and update of comprehensive plan of care as evidenced by provider attestation and co-signature Inter-disciplinary care team collaboration (see longitudinal plan of care) Comprehensive medication review performed; medication list updated in electronic medical record  (BP goal <130/80) -Controlled -Current treatment: Metoprolol succinate 50 mg twice daily  Spironolactone 25 mg daily  -Medications previously tried: valsartan -Current home readings: well controlled - patient felt washed out when too low on Entresto -Current dietary habits: eats small portions but tries to keep healthy diet -Current exercise habits: limited  -Denies hypotensive/hypertensive symptoms -Educated on BP goals and benefits of medications for prevention of heart attack, stroke and kidney damage; Daily salt intake goal < 2300 mg; Exercise goal of 150 minutes per week; -Counseled to monitor BP at home weekly, document, and provide log at future appointments -Recommended to continue current medication  Hyperlipidemia: (LDL goal < 55) -Not ideally controlled -Current treatment: atorvastatin 20 mg daily  Aspirin ec 81 mg daily  -Medications previously tried: none reported  -Current dietary patterns: little appetite but patient is trying to eat healthy -Current exercise habits:  limited -Educated on Cholesterol goals;  Benefits of statin for ASCVD risk reduction; Exercise goal of 150 minutes per week; -Recommended considering increase in statin dose if upcoming LDL above goal.   Heart Failure (Goal: manage symptoms and prevent exacerbations) -Controlled -Last ejection fraction: 25%  (Date: 10/2020) -HF type: Systolic -NYHA Class: III (marked limitation of activity) -Current treatment: Farxiga 10 mg daily  Digoxin 0.125 mg daily  Corlanolr 7.5 mg bid with a meal Metoprolol Succinate 50 mg bid  Nitroglcyerin 0.4 mg every 5 minutes prn chest pain  Torsemide 50 mg daily  Spironolactone 25 mg daily  -Medications previously tried: Entresto, valsartan -Current home BP/HR readings: patient reports bp runs low -Current dietary habits: very little appetite -Current exercise habits: limited  -Educated on Benefits of medications for managing symptoms and prolonging life Importance of weighing daily; if you gain more than 3 pounds in one day or 5 pounds in one week,  -Recommended to continue current medication  Migraine (Goal: prevent/manage migraines) -Not ideally controlled -Current treatment  Ajovy 225 mg every 30 days - (PAP) Hydrocodone 7.5/325 mg every 6 hours prn  -Medications previously tried: none reported  -Recommended to continue current medication  Breast Cancer -Managed by Lavera Guise -Lumpectomy September 2022 -Current treatment  Tamoxifen 20mg  (5 year treatment plan per specialist, started 08/27/21) -Radiation starting mid October 2022 -Recommended to continue current medication  Health Maintenance -Vaccine gaps: tdap, shingrix, COVID booster (finding out timing due to upcoming breast surgery) -Current therapy:   -Collaborated with surgeon to find out recommendation for COVID vaccine.   Patient Goals/Self-Care Activities Patient will:  - take medications as prescribed focus on medication adherence by using pill box target a minimum  of 150 minutes of moderate intensity exercise weekly engage in dietary modifications by **  Follow Up Plan: Telephone follow up appointment with care management team member scheduled for: 6 months  CMA F/U Monthly until Cx treatment completed  [place] Constable, Pharm.D. - [phone]       The patient verbalized understanding of instructions, educational materials, and care plan provided today and declined offer to receive copy of patient instructions, educational materials, and care plan.  The pharmacy team will reach out to the patient again over the next 90 days.   Lane Hacker, [facility]

## 2021-08-30 ENCOUNTER — Telehealth (HOSPITAL_COMMUNITY): Payer: Self-pay | Admitting: Pharmacy Technician

## 2021-08-30 NOTE — Telephone Encounter (Signed)
Advanced Heart Failure Patient Advocate Encounter  Sent in [place] renewal assistance for Corlanor.   Will follow up.

## 2021-08-31 DIAGNOSIS — Z510 Encounter for antineoplastic radiation therapy: Secondary | ICD-10-CM | POA: Diagnosis not present

## 2021-08-31 DIAGNOSIS — D0511 Intraductal carcinoma in situ of right breast: Secondary | ICD-10-CM | POA: Diagnosis not present

## 2021-09-03 DIAGNOSIS — D0511 Intraductal carcinoma in situ of right breast: Secondary | ICD-10-CM | POA: Diagnosis not present

## 2021-09-10 DIAGNOSIS — D0511 Intraductal carcinoma in situ of right breast: Secondary | ICD-10-CM | POA: Diagnosis not present

## 2021-09-11 ENCOUNTER — Ambulatory Visit (HOSPITAL_COMMUNITY): Payer: HMO

## 2021-09-13 ENCOUNTER — Ambulatory Visit: Payer: HMO | Admitting: Family Medicine

## 2021-09-13 ENCOUNTER — Ambulatory Visit (INDEPENDENT_AMBULATORY_CARE_PROVIDER_SITE_OTHER): Payer: HMO

## 2021-09-13 DIAGNOSIS — I5022 Chronic systolic (congestive) heart failure: Secondary | ICD-10-CM

## 2021-09-13 LAB — CUP PACEART REMOTE DEVICE CHECK
Date Time Interrogation Session: 20221027080709
Implantable Lead Implant Date: 20180410
Implantable Lead Implant Date: 20180410
Implantable Lead Location: 753859
Implantable Lead Location: 753860
Implantable Lead Model: 377
Implantable Lead Model: 402266
Implantable Lead Serial Number: 49794726
Implantable Lead Serial Number: 49838890
Implantable Pulse Generator Implant Date: 20180410
Pulse Gen Model: 404622
Pulse Gen Serial Number: 60982098

## 2021-09-17 DIAGNOSIS — I5022 Chronic systolic (congestive) heart failure: Secondary | ICD-10-CM

## 2021-09-17 DIAGNOSIS — I110 Hypertensive heart disease with heart failure: Secondary | ICD-10-CM

## 2021-09-17 DIAGNOSIS — E7849 Other hyperlipidemia: Secondary | ICD-10-CM | POA: Diagnosis not present

## 2021-09-18 DIAGNOSIS — Z510 Encounter for antineoplastic radiation therapy: Secondary | ICD-10-CM | POA: Diagnosis not present

## 2021-09-18 DIAGNOSIS — D0511 Intraductal carcinoma in situ of right breast: Secondary | ICD-10-CM | POA: Diagnosis not present

## 2021-09-20 ENCOUNTER — Other Ambulatory Visit: Payer: Self-pay | Admitting: Legal Medicine

## 2021-09-20 DIAGNOSIS — G894 Chronic pain syndrome: Secondary | ICD-10-CM

## 2021-09-21 ENCOUNTER — Other Ambulatory Visit: Payer: Self-pay | Admitting: Legal Medicine

## 2021-09-21 MED ORDER — HYDROCODONE-ACETAMINOPHEN 7.5-325 MG PO TABS: 1.0000 | ORAL_TABLET | Freq: Four times a day (QID) | ORAL | 0 refills | Status: DC | PRN

## 2021-09-21 NOTE — Progress Notes (Signed)
Remote ICD transmission.   

## 2021-09-24 DIAGNOSIS — D0511 Intraductal carcinoma in situ of right breast: Secondary | ICD-10-CM | POA: Diagnosis not present

## 2021-09-27 ENCOUNTER — Ambulatory Visit (HOSPITAL_COMMUNITY)
Admission: RE | Admit: 2021-09-27 | Discharge: 2021-09-27 | Disposition: A | Discharge status: Home / Self Care | Payer: HMO | Source: Ambulatory Visit | Attending: Internal Medicine | Admitting: Internal Medicine

## 2021-09-27 ENCOUNTER — Other Ambulatory Visit: Payer: Self-pay

## 2021-09-27 DIAGNOSIS — E785 Hyperlipidemia, unspecified: Secondary | ICD-10-CM | POA: Diagnosis not present

## 2021-09-27 DIAGNOSIS — I429 Cardiomyopathy, unspecified: Secondary | ICD-10-CM | POA: Diagnosis not present

## 2021-09-27 DIAGNOSIS — C50919 Malignant neoplasm of unspecified site of unspecified female breast: Secondary | ICD-10-CM | POA: Insufficient documentation

## 2021-09-27 DIAGNOSIS — Z950 Presence of cardiac pacemaker: Secondary | ICD-10-CM | POA: Insufficient documentation

## 2021-09-27 DIAGNOSIS — D0511 Intraductal carcinoma in situ of right breast: Secondary | ICD-10-CM | POA: Diagnosis not present

## 2021-09-27 DIAGNOSIS — I5022 Chronic systolic (congestive) heart failure: Secondary | ICD-10-CM | POA: Diagnosis not present

## 2021-09-27 DIAGNOSIS — I110 Hypertensive heart disease with heart failure: Secondary | ICD-10-CM | POA: Diagnosis not present

## 2021-09-27 LAB — ECHOCARDIOGRAM COMPLETE
Area-P 1/2: 4.39 cm2
Calc EF: 24 %
S' Lateral: 4.8 cm
Single Plane A2C EF: 24.1 %
Single Plane A4C EF: 23.6 %

## 2021-09-27 NOTE — Progress Notes (Signed)
  Echocardiogram 2D Echocardiogram has been performed.  Allison White 09/27/2021, 2:56 PM

## 2021-09-28 ENCOUNTER — Telehealth: Payer: Self-pay

## 2021-09-28 NOTE — Chronic Care Management (AMB) (Signed)
    Chronic Care Management Pharmacy Assistant   Name: Allison White  MRN: [id number] DOB: 05/10/1961  Reason for Encounter: General Adherence Call    Recent office visits:  09/21/21 Orders Only. D/C Oxycodone 5 mg due to change in therapy.  Recent consult visits:  None since 08/29/21  Hospital visits:  None since 08/29/21  Medications: Outpatient Encounter Medications as of 09/28/2021  Medication Sig   aspirin 81 MG EC tablet Take 81 mg by mouth daily.    dapagliflozin propanediol (FARXIGA) 10 MG TABS tablet Take 1 tablet (10 mg total) by mouth daily.   digoxin (LANOXIN) 0.125 MG tablet TAKE 1 TABLET BY MOUTH ONCE DAILY. APPT REQUIRED FOR FUTURE REFILLS   Fremanezumab-vfrm (AJOVY) 225 MG/1.5ML SOAJ Inject 225 mg into the skin every 30 (thirty) days.   HYDROcodone-acetaminophen (NORCO) 7.5-325 MG tablet Take 1 tablet by mouth every 6 (six) hours as needed for moderate pain.   ivabradine (CORLANOR) 7.5 MG TABS tablet Take 1 tablet (7.5 mg total) by mouth 2 (two) times daily with a meal.   LORATADINE PO Take by mouth daily. [facility] brand   metoprolol succinate (TOPROL-XL) 50 MG 24 hr tablet Take 1 tablet by mouth twice daily   nitroGLYCERIN (NITROSTAT) 0.4 MG SL tablet Place 0.4 mg under the tongue every 5 (five) minutes as needed for chest pain.   rosuvastatin (CRESTOR) 20 MG tablet Take 1 tablet (20 mg total) by mouth daily.   spironolactone (ALDACTONE) 25 MG tablet Take 1 tablet (25 mg total) by mouth daily.   tamoxifen (NOLVADEX) 20 MG tablet Take 1 tablet (20 mg total) by mouth daily.   torsemide (DEMADEX) 100 MG tablet Take 50 mg by mouth daily.   No facility-administered encounter medications on file as of 09/28/2021.    Contacted [name] for general disease state and medication adherence call.   Patient is not > 5 days past due for refill on the following medications per chart history:  [facility] Medications: Medication Name/mg Last Fill Days Supply Rosuvastatin 20  mg  08/06/21 90ds Farxiga 10 mg   02/23/21  5  (PAP)    What concerns do you have about your medications? Pt is not having any issues   The patient reports the following side effects with her medications. One of her medications is making her feel nauseous  but isn't sure which one   How often do you forget or accidentally miss a dose? Rarely  Do you use a pillbox? Yes  Are you having any problems getting your medications from your pharmacy? No  Has the cost of your medications been a concern? No  Since last visit with CPP, no interventions have been made:   The patient has not had an ED visit since last contact.   The patient denies problems with their health.    Patient states BP is taking every now and then and the latest was Blood pressure 150/70 a few days ago      Care Gaps: Last annual wellness visit?88/8/91 If applicable: Last eye exam / retinopathy screening? 10/25/20 Diabetic foot exam? 04/03/21 Mammogram: 05/14/21    Elray Mcgregor, [place] Pharmacist Assistant  [phone]

## 2021-10-01 DIAGNOSIS — D0511 Intraductal carcinoma in situ of right breast: Secondary | ICD-10-CM | POA: Diagnosis not present

## 2021-10-02 ENCOUNTER — Encounter (HOSPITAL_BASED_OUTPATIENT_CLINIC_OR_DEPARTMENT_OTHER): Payer: HMO | Admitting: Cardiology

## 2021-10-02 ENCOUNTER — Encounter: Payer: Self-pay | Admitting: Oncology

## 2021-10-02 NOTE — Telephone Encounter (Signed)
Will coordinate AWV for this patient

## 2021-10-08 DIAGNOSIS — D0511 Intraductal carcinoma in situ of right breast: Secondary | ICD-10-CM | POA: Diagnosis not present

## 2021-10-17 ENCOUNTER — Other Ambulatory Visit: Payer: Self-pay | Admitting: Hematology and Oncology

## 2021-10-17 MED ORDER — PROCHLORPERAZINE MALEATE 10 MG PO TABS: 10.0000 mg | ORAL_TABLET | Freq: Four times a day (QID) | ORAL | 3 refills | Status: DC | PRN

## 2021-10-18 ENCOUNTER — Telehealth: Payer: Self-pay

## 2021-10-26 DIAGNOSIS — Z1211 Encounter for screening for malignant neoplasm of colon: Secondary | ICD-10-CM | POA: Diagnosis not present

## 2021-11-01 LAB — FECAL OCCULT BLOOD, GUAIAC: Fecal Occult Blood: NEGATIVE

## 2021-11-07 ENCOUNTER — Other Ambulatory Visit: Payer: Self-pay | Admitting: Legal Medicine

## 2021-11-07 DIAGNOSIS — G894 Chronic pain syndrome: Secondary | ICD-10-CM

## 2021-11-07 MED ORDER — HYDROCODONE-ACETAMINOPHEN 7.5-325 MG PO TABS
1.0000 | ORAL_TABLET | Freq: Four times a day (QID) | ORAL | 0 refills | Status: DC | PRN
Start: 2021-11-07 — End: 2021-11-22

## 2021-11-08 ENCOUNTER — Other Ambulatory Visit: Payer: Self-pay

## 2021-11-08 MED ORDER — HYDROCODONE-ACETAMINOPHEN 10-300 MG PO TABS: 1.0000 | ORAL_TABLET | Freq: Four times a day (QID) | ORAL | 0 refills | Status: DC | PRN

## 2021-11-19 NOTE — Progress Notes (Signed)
Advanced [facility] Failure Clinic Note  Date:  11/20/2021   ID:  Allison White, DOB 1961-03-19, MRN [id number]  Location: Home  Provider location: [facility] Type of Visit: Established patient  PCP:  Lillard Anes, MD  Cardiologist:  None Primary HF: Semiah Konczal  Chief Complaint: Heart Failure follow-up   History of Present Illness:  Allison White is a 61 y.o. female with morbid obesity, HTN, carotid artery dissection with stenting of left carotid due to possible FMD, CAD s/p anterior MI 1/18 (DES to LAD), systolic HF with EF 66-06% s/p Biotronik ICD.   Seen in 5/21 with worsening NYHA IIIb symptoms. Had R/L cath with normal coronary arteries; widely patent LAD stent, EF 20-25%, Normal hemodynamics with CI 3.4. -> CPX  = pVO2: 15.3 (90% predicted pVO2) - adjusted to iBW 25.43ml/kg/min VE/VCO2 slope:  33   Seen in HF clinic 01/2021 with orthostasis. Reds 30%. Recommended ted hose. Sleep study deferred at patient/husband request.   Had lumpectomy 9/22 for abnormal mammogram. Finished 6 weeks of XRT in 12/22. No chemo. Feeling better. Now back to doing some exercise with stationary bike and yard work. Riding 2 miles at good pace. Mild exertional dyspnea. No CP, edema, orthopnea or PND. BP is labile. 105/60 -> 158/95  No problems with meds.   Echo 11/22 EF 25-30% G1DD  RV ok   Cardiac Studies Echo 06/2017 EF 20-25% Echo 02/2018 LVEF 25-30%, Trivial MR, Normal RV, Mild TR, PA peak pressure 26 mm Hg Echo 8/20  EF 20-25% RV ok. Echo 12/21 EF 25%   CPX 8/21 FVC 3.08 (91%)      FEV1 2.30 (87%%)        FEV1/FVC 75 (94%)        MVV 89 (94%)  RER 1.10 Peak VO2: 15.3 (90% predicted peak VO2) - adjusted to iBW 25.41ml/kg/min VE/VCO2 slope:  33   Studies: R/L cath 04/07/20 Ao = 102/56 (75) LV = 104/13 RA = 3 RV = 27/4 PA = 28/9 (17) PCW = 7 Fick cardiac output/index = 7.2/3.4 PVR = 1.4 WU FA sat = 99% PA sat = 79%, 81% High SVC sat =  81%  Monitor 10/19 1. Sinus rhythm 2. Rare PVCs and bigeminy (< 1.0%) 3. Two patient-triggered events associated with isolated PVCs.  4. No high-grade arrhythmias     CPX 03/17/18 FVC 3.00 (87%), FEV1 2.97(88%), FEV1/FVC 79 (99%)  Peak VO2: 13.8 - When adjusted to the patient's ideal body weight of 142.2 lb (64.5 kg) the peak VO2 is 22.6 ml/kg VE/VCO2 slope: 38 OUES: 1.52 Peak RER: 1.11 Ventilatory Threshold: 11.8 VE/MVV:  95% O2pulse:  8 Interpretation: Mild limitation due to HF and obesity    Past Medical History:  Diagnosis Date   CHF (congestive heart failure) ([facility])    FMD (facioscapulohumeral muscular dystrophy) (H[facility])    Headache(784.0)    Heart failure (H[facility])    Hyperlipidemia    MI (myocardial infarction) ([place])    Migraine    Nonruptured cerebral aneurysm, internal carotid artery    Left side, stent placement (2009)   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    Vertigo    Past Surgical History:  Procedure Laterality Date   ABDOMINAL HYSTERECTOMY     CARDIAC DEFIBRILLATOR PLACEMENT     CEREBRAL ANEURYSM REPAIR Left 2009   CESAREAN SECTION     CORONARY ANGIOPLASTY WITH STENT PLACEMENT     PACEMAKER IMPLANT     RIGHT/LEFT HEART  CATH AND CORONARY ANGIOGRAPHY N/A 04/07/2020   Procedure: RIGHT/LEFT HEART CATH AND CORONARY ANGIOGRAPHY;  Surgeon: Jolaine Artist, MD;  Location: [facility];  Service: Cardiovascular;  Laterality: N/A;   TUBAL LIGATION       Current Outpatient Medications  Medication Sig Dispense Refill   aspirin 81 MG EC tablet Take 81 mg by mouth daily.      dapagliflozin propanediol (FARXIGA) 10 MG TABS tablet Take 1 tablet (10 mg total) by mouth daily. 30 tablet 2   digoxin (LANOXIN) 0.125 MG tablet TAKE 1 TABLET BY MOUTH ONCE DAILY. APPT REQUIRED FOR FUTURE REFILLS 30 tablet 0   Fremanezumab-vfrm (AJOVY) 225 MG/1.5ML SOAJ Inject 225 mg into the skin every 30 (thirty) days. 2 mL 0   HYDROcodone-acetaminophen (NORCO) 7.5-325 MG tablet Take  1 tablet by mouth every 6 (six) hours as needed for moderate pain. 90 tablet 0   HYDROcodone-Acetaminophen 10-300 MG TABS Take 1 tablet by mouth every 6 (six) hours as needed. 90 tablet 0   ivabradine (CORLANOR) 7.5 MG TABS tablet Take 1 tablet (7.5 mg total) by mouth 2 (two) times daily with a meal. 180 tablet 3   LORATADINE PO Take by mouth daily. [facility] brand     metoprolol succinate (TOPROL-XL) 50 MG 24 hr tablet Take 1 tablet by mouth twice daily 180 tablet 2   nitroGLYCERIN (NITROSTAT) 0.4 MG SL tablet Place 0.4 mg under the tongue every 5 (five) minutes as needed for chest pain.     rosuvastatin (CRESTOR) 20 MG tablet Take 1 tablet by mouth once daily 90 tablet 2   spironolactone (ALDACTONE) 25 MG tablet Take 1 tablet (25 mg total) by mouth daily. 30 tablet 6   torsemide (DEMADEX) 100 MG tablet Take 50 mg by mouth daily.     No current facility-administered medications for this encounter.    Allergies:   Tape, Plavix [clopidogrel bisulfate], and Tamoxifen   Social History:  The patient  reports that she has quit smoking. Her smoking use included cigarettes. She has a 6.00 pack-year smoking history. She has never used smokeless tobacco. She reports current alcohol use. She reports that she does not use drugs.   Family History:  The patient's family history includes Cerebral aneurysm in her paternal grandmother; Dementia in her paternal grandfather; Hypertension in her brother; Prostate cancer in her father and paternal grandfather; Thyroid disease in her mother.   ROS:  Please see the history of present illness.   All other systems are personally reviewed and negative.   Body mass index is 38.31 kg/m.  Vitals:   11/20/21 1008  BP: 110/70  Pulse: 68  SpO2: 94%  Weight: 104.4 kg (230 lb 3.2 oz)    Wt Readings from Last 3 Encounters:  11/20/21 104.4 kg (230 lb 3.2 oz)  08/21/21 105.2 kg (231 lb 14.4 oz)  08/16/21 106.4 kg (234 lb 9.6 oz)    Exam:   General:  Well  appearing. No resp difficulty HEENT: normal Neck: supple. no JVD. Carotids 2+ bilat; no bruits. No lymphadenopathy or thryomegaly appreciated. Cor: PMI nondisplaced. Regular rate & rhythm. No rubs, gallops or murmurs. Lungs: clear Abdomen: obese soft, nontender, nondistended. No hepatosplenomegaly. No bruits or masses. Good bowel sounds. Extremities: no cyanosis, clubbing, rash, trace edema Neuro: alert & orientedx3, cranial nerves grossly intact. moves all 4 extremities w/o difficulty. Affect pleasant   Recent Labs: 02/05/2021: B Natriuretic Peptide 114.3 08/03/2021: ALT 66; BUN 19; Creatinine, Ser 1.03; Potassium 4.0; Sodium 139  08/21/2021: Hemoglobin 17.7; Platelets 236    Wt Readings from Last 3 Encounters:  11/20/21 104.4 kg (230 lb 3.2 oz)  08/21/21 105.2 kg (231 lb 14.4 oz)  08/16/21 106.4 kg (234 lb 9.6 oz)      ASSESSMENT AND PLAN:  1. Chronic systolic HF, ICM - Echo 12/4399 LVEF 25-30%, Trivial MR, Normal RV, Mild TR, PA peak pressure 26 mm Hg - s/p Biotronik ICD - Echo 07/13/19 EF 20-25% - Cath 5/21 EF 20-25% normal coronaries. Well compensated hemodynamics with CI 3.4 - Echo 12/21 EF 25% - CPX 8/21 very reassuring - Peak VO2: 15.3 (90% predicted peak VO2) - adjusted to iBW 25.28ml/kg/min. VEVCO2 33  - Echo 11/22 EF 25-30% G1DD  RV ok  - Improved NYHA II - Volume status stable on torsemide 50 daily. Takes extra as needed.  - Continue Entresto 24/26 bid for now  - Continue spiro 25 mg daily.  - Continue toprol 50 mg BID - Continue digoxin 0.125 mg.  - Continue Farxiga 10 mg daily.  - Continue ivabradine 7.5 bid - labs today   2. CAD s/p anterior MI 1/18 with DES to LAD in [place] - cath 5/21 normal cors with patent LAD stent - no s/s ischemia   3. Obesity Body mass index is 38.31 kg/m.   4. 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 No changes   5. Palpitations - Zio patch 10/19 was ok  - We discussed getting AliveCor device if these persist    6. Breast Cancer - s/p lumpectomy 9/22 followed by XRT. Completed 12/22 - no plans for chemo  7. Polycythemia - followed by hematology. She is refusing bone narrow biopsy - Home sleep study 1/22 AHI 4.6    Signed, Glori Bickers, MD  11/20/2021 10:33 AM  [facility] [place] [address] [phone] (office) [phone] (fax)

## 2021-11-20 ENCOUNTER — Other Ambulatory Visit: Payer: Self-pay

## 2021-11-20 ENCOUNTER — Ambulatory Visit (HOSPITAL_COMMUNITY)
Admission: RE | Admit: 2021-11-20 | Discharge: 2021-11-20 | Disposition: A | Discharge status: Home / Self Care | Payer: HMO | Source: Ambulatory Visit | Attending: Internal Medicine | Admitting: Internal Medicine

## 2021-11-20 ENCOUNTER — Encounter (HOSPITAL_COMMUNITY): Payer: Self-pay | Admitting: Internal Medicine

## 2021-11-20 VITALS — BP 110/70 | HR 68 | Wt 230.2 lb

## 2021-11-20 DIAGNOSIS — Z6838 Body mass index (BMI) 38.0-38.9, adult: Secondary | ICD-10-CM | POA: Diagnosis not present

## 2021-11-20 DIAGNOSIS — Z87891 Personal history of nicotine dependence: Secondary | ICD-10-CM | POA: Insufficient documentation

## 2021-11-20 DIAGNOSIS — I2510 Atherosclerotic heart disease of native coronary artery without angina pectoris: Secondary | ICD-10-CM | POA: Diagnosis not present

## 2021-11-20 DIAGNOSIS — I110 Hypertensive heart disease with heart failure: Secondary | ICD-10-CM | POA: Insufficient documentation

## 2021-11-20 DIAGNOSIS — I252 Old myocardial infarction: Secondary | ICD-10-CM | POA: Insufficient documentation

## 2021-11-20 DIAGNOSIS — Z955 Presence of coronary angioplasty implant and graft: Secondary | ICD-10-CM | POA: Diagnosis not present

## 2021-11-20 DIAGNOSIS — Z95810 Presence of automatic (implantable) cardiac defibrillator: Secondary | ICD-10-CM | POA: Insufficient documentation

## 2021-11-20 DIAGNOSIS — I5022 Chronic systolic (congestive) heart failure: Secondary | ICD-10-CM | POA: Insufficient documentation

## 2021-11-20 DIAGNOSIS — C50911 Malignant neoplasm of unspecified site of right female breast: Secondary | ICD-10-CM

## 2021-11-20 DIAGNOSIS — I255 Ischemic cardiomyopathy: Secondary | ICD-10-CM | POA: Insufficient documentation

## 2021-11-20 DIAGNOSIS — I25119 Atherosclerotic heart disease of native coronary artery with unspecified angina pectoris: Secondary | ICD-10-CM | POA: Diagnosis not present

## 2021-11-20 DIAGNOSIS — Z923 Personal history of irradiation: Secondary | ICD-10-CM | POA: Insufficient documentation

## 2021-11-20 DIAGNOSIS — D751 Secondary polycythemia: Secondary | ICD-10-CM | POA: Insufficient documentation

## 2021-11-20 DIAGNOSIS — R002 Palpitations: Secondary | ICD-10-CM | POA: Diagnosis not present

## 2021-11-20 DIAGNOSIS — Z79899 Other long term (current) drug therapy: Secondary | ICD-10-CM | POA: Insufficient documentation

## 2021-11-20 DIAGNOSIS — Z853 Personal history of malignant neoplasm of breast: Secondary | ICD-10-CM | POA: Insufficient documentation

## 2021-11-20 LAB — COMPREHENSIVE METABOLIC PANEL
ALT: 30 U/L (ref 0–44)
AST: 27 U/L (ref 15–41)
Albumin: 4 g/dL (ref 3.5–5.0)
Alkaline Phosphatase: 62 U/L (ref 38–126)
Anion gap: 10 (ref 5–15)
BUN: 19 mg/dL (ref 6–20)
CO2: 33 mmol/L — ABNORMAL HIGH (ref 22–32)
Calcium: 10.1 mg/dL (ref 8.9–10.3)
Chloride: 95 mmol/L — ABNORMAL LOW (ref 98–111)
Creatinine, Ser: 0.99 mg/dL (ref 0.44–1.00)
GFR, Estimated: >60 mL/min (ref >60)
Glucose, Bld: 120 mg/dL — ABNORMAL HIGH (ref 70–99)
Potassium: 3.4 mmol/L — ABNORMAL LOW (ref 3.5–5.1)
Sodium: 138 mmol/L (ref 135–145)
Total Bilirubin: 0.9 mg/dL (ref 0.3–1.2)
Total Protein: 7.3 g/dL (ref 6.5–8.1)

## 2021-11-20 LAB — CBC
HCT: 49.2 % — ABNORMAL HIGH (ref 36.0–46.0)
Hemoglobin: 16.8 g/dL — ABNORMAL HIGH (ref 12.0–15.0)
MCH: 31.6 pg (ref 26.0–34.0)
MCHC: 34.1 g/dL (ref 30.0–36.0)
MCV: 92.5 fL (ref 80.0–100.0)
Platelets: 228 10*3/uL (ref 150–400)
RBC: 5.32 MIL/uL — ABNORMAL HIGH (ref 3.87–5.11)
RDW: 12.8 % (ref 11.5–15.5)
WBC: 11.1 10*3/uL — ABNORMAL HIGH (ref 4.0–10.5)
nRBC: 0 % (ref 0.0–0.2)

## 2021-11-20 LAB — DIGOXIN LEVEL: Digoxin Level: 0.5 ng/mL — ABNORMAL LOW (ref 0.8–2.0)

## 2021-11-20 NOTE — Patient Instructions (Signed)
Medication Changes:  NO CHANGE  Lab Work:  Labs done today, your results will be available in MyChart, we will contact you for abnormal readings.   Testing/Procedures:  none  Referrals:  none  Special Instructions // Education:  none  Follow-Up in: 6 months (June 2023) ** Call in May for follow up appointment**  At the [facility], you and your health needs are our priority. We have a designated team specialized in the treatment of Heart Failure. This Care Team includes your primary Heart Failure Specialized Cardiologist (physician), Advanced Practice Providers (APPs- Physician Assistants and Nurse Practitioners), and Pharmacist who all work together to provide you with the care you need, when you need it.   You may see any of the following providers on your designated Care Team at your next follow up:  Dr Glori Bickers Dr Haynes Kerns, NP Lyda Jester, [facility] [place], [place] Audry Riles, PharmD   Please be sure to bring in all your medications bottles to every appointment.   Need to Contact us:  If you have any questions or concerns before your next appointment please send [place] a message through [place] or call our office at [phone].    TO LEAVE A MESSAGE FOR THE NURSE SELECT OPTION 2, PLEASE LEAVE A MESSAGE INCLUDING: YOUR NAME DATE OF BIRTH CALL BACK NUMBER REASON FOR CALL**this is important as we prioritize the call backs  YOU WILL RECEIVE A CALL BACK THE SAME DAY AS LONG AS YOU CALL BEFORE 4:00 PM

## 2021-11-20 NOTE — Addendum Note (Signed)
Encounter addended by: Jerl Mina, RN on: 11/20/2021 10:45 AM  Actions taken: Diagnosis association updated, Order list changed, Charge Capture section accepted, Clinical Note Signed

## 2021-11-22 ENCOUNTER — Other Ambulatory Visit: Payer: Self-pay | Admitting: Internal Medicine

## 2021-11-22 ENCOUNTER — Other Ambulatory Visit: Payer: Self-pay

## 2021-11-22 DIAGNOSIS — G894 Chronic pain syndrome: Secondary | ICD-10-CM

## 2021-11-22 MED ORDER — HYDROCODONE-ACETAMINOPHEN 7.5-325 MG PO TABS: 1.0000 | ORAL_TABLET | Freq: Four times a day (QID) | ORAL | 0 refills | Status: DC | PRN

## 2021-11-26 ENCOUNTER — Other Ambulatory Visit: Payer: Self-pay

## 2021-11-26 MED ORDER — SPIRONOLACTONE 25 MG PO TABS: 25.0000 mg | ORAL_TABLET | Freq: Every day | ORAL | 6 refills | Status: DC

## 2021-11-27 ENCOUNTER — Telehealth: Payer: Self-pay

## 2021-11-27 NOTE — Chronic Care Management (AMB) (Signed)
° ° °  Chronic Care Management Pharmacy Assistant   Name: Heather Mckendree  MRN: [id number] DOB: [birth year]   Reason for Encounter: General Adherence and Schedule April Appt    Recent office visits:  None   Recent consult visits:  11/20/21 (Cardiology) Glori Bickers MD. Seen for CHF. No med changes.   10/18/21 (Oncology) Georgette Shell RN. Telephone Encounter. Pt stopped :Tamoxifen due to side effects.   10/17/21 (Oncology) Orders Only. Valli Glance NP. D/C Compazine 10 mg.   Hospital visits:  None   Medications: Outpatient Encounter Medications as of 11/27/2021  Medication Sig   aspirin 81 MG EC tablet Take 81 mg by mouth daily.    dapagliflozin propanediol (FARXIGA) 10 MG TABS tablet Take 1 tablet (10 mg total) by mouth daily.   digoxin (LANOXIN) 0.125 MG tablet Take 1 tablet by mouth once daily   Fremanezumab-vfrm (AJOVY) 225 MG/1.5ML SOAJ Inject 225 mg into the skin every 30 (thirty) days.   HYDROcodone-acetaminophen (NORCO) 7.5-325 MG tablet Take 1 tablet by mouth every 6 (six) hours as needed for moderate pain.   ivabradine (CORLANOR) 7.5 MG TABS tablet Take 1 tablet (7.5 mg total) by mouth 2 (two) times daily with a meal.   LORATADINE PO Take by mouth daily. [facility] brand   metoprolol succinate (TOPROL-XL) 50 MG 24 hr tablet Take 1 tablet by mouth twice daily   nitroGLYCERIN (NITROSTAT) 0.4 MG SL tablet Place 0.4 mg under the tongue every 5 (five) minutes as needed for chest pain.   rosuvastatin (CRESTOR) 20 MG tablet Take 1 tablet by mouth once daily   spironolactone (ALDACTONE) 25 MG tablet Take 1 tablet (25 mg total) by mouth daily.   torsemide (DEMADEX) 100 MG tablet Take 50 mg by mouth daily.   No facility-administered encounter medications on file as of 11/27/2021.   Contacted Aynsley Fleet for general disease state and medication adherence call.   Patient is not > 5 days past due for refill on the following medications per chart history:  [facility]  Medications: Medication Name/mg Last Fill Days Supply Rosuvastatin 20 mg               11/08/21 90ds 08/06/21            90ds Farxiga 10 mg                       ( PAP )   What concerns do you have about your medications? Pt denies any concerns   The patient denies side effects with her medications.   How often do you forget or accidentally miss a dose? Never  Do you use a pillbox? Yes  Are you having any problems getting your medications from your pharmacy? No  Has the cost of your medications been a concern? No  Since last visit with CPP, no interventions have been made:   The patient has not had an ED visit since last contact.   The patient denies problems with their health.   she denies  concerns or questions for [place] , at this time.    DATE:             BP                 11/29/21   120/78   Care Gaps: Last annual wellness visit?02/10/70 If applicable: Last eye exam / retinopathy screening? 10/25/20 Diabetic foot exam? 04/03/21 Mammogram: 05/14/21  Elray Mcgregor, [place] Pharmacist Assistant  [phone]

## 2021-11-29 ENCOUNTER — Other Ambulatory Visit: Payer: Self-pay

## 2021-11-29 ENCOUNTER — Ambulatory Visit (INDEPENDENT_AMBULATORY_CARE_PROVIDER_SITE_OTHER): Payer: HMO

## 2021-11-29 VITALS — BP 122/78 | HR 94 | Resp 16 | Ht 65.0 in | Wt 234.6 lb

## 2021-11-29 DIAGNOSIS — Z23 Encounter for immunization: Secondary | ICD-10-CM | POA: Diagnosis not present

## 2021-11-29 DIAGNOSIS — Z0000 Encounter for general adult medical examination without abnormal findings: Secondary | ICD-10-CM

## 2021-12-02 NOTE — Patient Instructions (Signed)

## 2021-12-02 NOTE — Progress Notes (Signed)
Subjective:  Patient ID: Allison [name], female    DOB: Feb 04, 1961  Age: 61 y.o. MRN: [id number]  Chief Complaint  Patient presents with   Hyperlipidemia   Hypertension   Congestive Heart Failure    HPI  Hyperlipidemia: Patient takes rosuvastatin 20 mg daily.  Well cholesterol medicines and no side effects for abnormal liver functions.  CHF: Takes Torsemide 100 mg daily, Digoxin take 1 tablet 0.125 daily.  Patient has no recent congestive heart failure symptoms he is on torsemide..  She has still not been gaining weight Hypertension: Currently taking aspirin 81 mg  daily, metoprolol 50 mg BID, Torsemide 50 daily. She sees Dr Haroldine Laws and he checked BMP, Digoxin level and CBC. showed hypokalemia 3.4. We will repeat the [name] today. EF 25% on 09/2021.  Breast Cancer: Patient has right breast cancer and she mentioned that she finished 6 radiation treatments on 10/16/2021 and lumpectomy to times. She will have next mammogram on 02/2022. Patient mentioned Tamoxifen gave her nauseas.  Fibromuscular dysplasia: Patient is taking CBD gummies.  Patient is having no more problems with her fibromuscular dysplasia she has no evidence for any recurrent dissections of arteries as she had in the past.  She has been followed by cardiology  Migranes: She is currently taking Ajovy monthly.  Her migraines are stable and she is taking Ajovy at the present time and doing well.  Current Outpatient Medications on File Prior to Visit  Medication Sig Dispense Refill   aspirin 81 MG EC tablet Take 81 mg by mouth daily.      dapagliflozin propanediol (FARXIGA) 10 MG TABS tablet Take 1 tablet (10 mg total) by mouth daily. 30 tablet 2   digoxin (LANOXIN) 0.125 MG tablet Take 1 tablet by mouth once daily 90 tablet 3   Fremanezumab-vfrm (AJOVY) 225 MG/1.5ML SOAJ Inject 225 mg into the skin every 30 (thirty) days. 2 mL 0   HYDROcodone-acetaminophen (NORCO) 7.5-325 MG tablet Take 1 tablet by mouth every 6 (six) hours as  needed for moderate pain. 90 tablet 0   ivabradine (CORLANOR) 7.5 MG TABS tablet Take 1 tablet (7.5 mg total) by mouth 2 (two) times daily with a meal. 180 tablet 3   LORATADINE PO Take by mouth daily. [facility] brand     metoprolol succinate (TOPROL-XL) 50 MG 24 hr tablet Take 1 tablet by mouth twice daily 180 tablet 2   nitroGLYCERIN (NITROSTAT) 0.4 MG SL tablet Place 0.4 mg under the tongue every 5 (five) minutes as needed for chest pain.     rosuvastatin (CRESTOR) 20 MG tablet Take 1 tablet by mouth once daily 90 tablet 2   spironolactone (ALDACTONE) 25 MG tablet Take 1 tablet (25 mg total) by mouth daily. 30 tablet 6   torsemide (DEMADEX) 100 MG tablet Take 50 mg by mouth daily.     No current facility-administered medications on file prior to visit.   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    Vertigo    Past Surgical History:  Procedure Laterality Date   ABDOMINAL HYSTERECTOMY  BREAST LUMPECTOMY Right 07/2021   CARDIAC DEFIBRILLATOR PLACEMENT     CEREBRAL ANEURYSM REPAIR Left 2009   CESAREAN SECTION     CORONARY ANGIOPLASTY WITH STENT PLACEMENT     PACEMAKER IMPLANT     RIGHT/LEFT HEART CATH AND CORONARY ANGIOGRAPHY N/A 04/07/2020   Procedure: RIGHT/LEFT HEART CATH AND CORONARY ANGIOGRAPHY;  Surgeon: Jolaine Artist, MD;  Location: [facility];  Service: Cardiovascular;  Laterality: N/A;   TUBAL LIGATION      Family History  Problem Relation Age of Onset   Thyroid disease Mother    Prostate cancer Father        Prostate   Cerebral aneurysm Paternal Grandmother        Nonruptured   Dementia  Paternal Grandfather    Prostate cancer Paternal Grandfather    Hypertension Brother    Breast cancer Neg Hx    Social History   Socioeconomic History   Marital status: Married    Spouse name: Tim   Number of children: 4   Years of education: Not on file   Highest education level: High school graduate  Occupational History   Not on file  Tobacco Use   Smoking status: Former    Packs/day: 1.00    Years: 6.00    Pack years: 6.00    Types: Cigarettes   Smokeless tobacco: Never   Tobacco comments:    Quit over 30 years ago  Vaping Use   Vaping Use: Never used  Substance and Sexual Activity   Alcohol use: Yes    Comment: Occasionally "little"   Drug use: No   Sexual activity: Not Currently  Other Topics Concern   Not on file  Social History Narrative   Lives with husband in a one-story home.     Right handed   Caffeine: "very little"   Social Determinants of Health   Financial Resource Strain: High Risk   Difficulty of Paying Living Expenses: Hard  Food Insecurity: Not on file  Transportation Needs: No Transportation Needs   Lack of Transportation (Medical): No   Lack of Transportation (Non-Medical): No  Physical Activity: Not on file  Stress: Not on file  Social Connections: Not on file    Review of Systems  Constitutional:  Negative for chills, fatigue and fever.  HENT:  Negative for congestion, ear pain and sore throat.   Respiratory:  Negative for cough and shortness of breath.   Cardiovascular:  Negative for chest pain and palpitations.  Gastrointestinal:  Negative for abdominal pain, constipation, diarrhea, nausea and vomiting.  Endocrine: Negative for polydipsia, polyphagia and polyuria.  Genitourinary:  Negative for difficulty urinating and dysuria.  Musculoskeletal:  Negative for arthralgias, back pain and myalgias.  Skin:  Negative for rash.  Neurological:  Positive for headaches.  Psychiatric/Behavioral:  Negative for dysphoric mood. The patient is  not nervous/anxious.     Objective:  BP 120/80    Pulse 74    Temp 98.9 F (37.2 C)    Resp 15    Ht 5\' 5"  (1.651 m)    Wt 230 lb (104.3 kg)    BMI 38.27 kg/m   BP/Weight 12/03/2021 9/79/8921 11/26/4172  Systolic BP 081 448 185  Diastolic BP 80 78 70  Wt. (Lbs) 230 234.6 230.2  BMI 38.27 39.04 38.31    Physical Exam Vitals reviewed.  Constitutional:      Appearance: Normal appearance. She is obese.  HENT:     Head: Normocephalic.  Right Ear: Tympanic membrane, ear canal and external ear normal.     Left Ear: Tympanic membrane, ear canal and external ear normal.     Nose: Nose normal.     Mouth/Throat:     Mouth: Mucous membranes are moist.     Pharynx: Oropharynx is clear. No posterior oropharyngeal erythema.  Eyes:     Extraocular Movements: Extraocular movements intact.     Conjunctiva/sclera: Conjunctivae normal.     Pupils: Pupils are equal, round, and reactive to light.  Neck:     Vascular: No carotid bruit.  Cardiovascular:     Rate and Rhythm: Normal rate and regular rhythm.     Pulses: Normal pulses.     Heart sounds: No murmur heard.   No gallop.  Pulmonary:     Effort: Pulmonary effort is normal. No respiratory distress.     Breath sounds: Normal breath sounds. No wheezing.  Abdominal:     General: Bowel sounds are normal. There is no distension.     Palpations: Abdomen is soft. There is no mass.     Tenderness: There is no abdominal tenderness.  Musculoskeletal:        General: Normal range of motion.     Cervical back: Normal range of motion. No tenderness.     Right lower leg: No edema.     Left lower leg: No edema.  Skin:    General: Skin is warm and dry.     Capillary Refill: Capillary refill takes less than 2 seconds.  Neurological:     General: No focal deficit present.     Mental Status: She is alert and oriented to person, place, and time. Mental status is at baseline.     Gait: Gait normal.     Deep Tendon Reflexes: Reflexes normal.   Psychiatric:        Mood and Affect: Mood normal.        Behavior: Behavior normal.        Thought Content: Thought content normal.        Lab Results  Component Value Date   WBC 11.1 (H) 11/20/2021   HGB 16.8 (H) 11/20/2021   HCT 49.2 (H) 11/20/2021   PLT 228 11/20/2021   GLUCOSE 120 (H) 11/20/2021   CHOL 190 08/03/2021   TRIG 203 (H) 08/03/2021   HDL 45 08/03/2021   LDLCALC 110 (H) 08/03/2021   ALT 30 11/20/2021   AST 27 11/20/2021   NA 138 11/20/2021   K 3.4 (L) 11/20/2021   CL 95 (L) 11/20/2021   CREATININE 0.99 11/20/2021   BUN 19 11/20/2021   CO2 33 (H) 11/20/2021   TSH 2.880 07/17/2020   INR 1.0 04/04/2020   HGBA1C 6.1 10/25/2020      Assessment & Plan:   Problem List Items Addressed This Visit       Cardiovascular and Mediastinum   Chronic systolic congestive heart failure ([place]) (Chronic)    Management for specialist. EF 25% 09/2021. Patient has chronic congestive heart failure with ejection fraction 25% recently on her 2D echocardiogram she is otherwise functioning quite well although has limited exercise tolerance.  No chest pain and no weight gain or swelling in her legs.      Relevant Orders   Hemoglobin A1c   Fibromuscular dysplasia (HCC) Patient has fibromuscular dysplasia but no further symptoms with this is stable no vascular complications   Hypertensive heart disease with heart failure ([place]) - Primary   Relevant Orders   Comprehensive  metabolic panel   CBC with Differential/Platelet Patient has chronic hypertension under control hypertension is complicated by congestive heart failure which is stable.   Migraine with aura and without status migrainosus, not intractable    The current medical regimen is effective;  continue present plan and medications. Patient has chronic migraines and is doing well with Ajovy and her present medications she is not gets vertigo with the headaches.       Other   Morbid obesity ([place]) (Chronic) Patient has  morbid obesity based on BMI of 38 along with hypertension and congestive heart failure    Hyperlipidemia   Relevant Orders   Lipid panel AN INDIVIDUAL CARE PLAN for hyperlipidemia/ cholesterol was established and reinforced today.  The patient's status was assessed using clinical findings on exam, lab and other diagnostic tests. The patient's disease status was assessed based on evidence-based guidelines and found to be well controlled. MEDICATIONS were reviewed. SELF MANAGEMENT GOALS have been discussed and patient's success at attaining the goal of low cholesterol was assessed. RECOMMENDATION given include regular exercise 3 days a week and low cholesterol/low fat diet. CLINICAL SUMMARY including written plan to identify barriers unique to the patient due to social or economic  reasons was discussed.    Breast cancer [facility])    Management per specialist. Patient has breast cancer and is presently getting care with Dr. Bobby Rumpf who is also watching her for polycythemia.  Patient had ductal carcinoma in situ.  No radiation or chemotherapy she has so far has been unable to tolerate tamoxifen..      Other Visit Diagnoses     Hypokalemia     Patient is cardiology yesterday and noted to have a potassium of 3.4 we will recheck it today but she probably will need potassium supplementation.     .    Orders Placed This Encounter  Procedures   Comprehensive metabolic panel   Hemoglobin A1c   Lipid panel   CBC with Differential/Platelet    I,Pernell Dikes,acting as a scribe for Reinaldo Meeker, MD.,have documented all relevant documentation on the behalf of Reinaldo Meeker, MD,as directed by  Reinaldo Meeker, MD while in the presence of Reinaldo Meeker, MD.   Follow-up: Return in about 3 months (around 03/03/2022) for fasting.  An After Visit Summary was printed and given to the patient.  Reinaldo Meeker, MD [facility] [phone]

## 2021-12-02 NOTE — Progress Notes (Signed)
Subjective:   Allison [name] is a 61 y.o. female who presents for Medicare Annual (Subsequent) preventive examination.  This wellness visit is conducted by a nurse.  The patient's medications were reviewed and reconciled since the patient's last visit.  History details were provided by the patient.  The history appears to be reliable.    Patient's last AWV was more than a year ago.   Medical History: Patient history and Family history was reviewed  Medications, Allergies, and preventative health maintenance was reviewed and updated.   Review of Systems    ROS-Negative  Cardiac Risk Factors include: obesity (BMI >30kg/m2)     Objective:    Today's Vitals   11/29/21 1322  BP: 122/78  Pulse: 94  Resp: 16  SpO2: 97%  Weight: 234 lb 9.6 oz (106.4 kg)  Height: 5\' 5"  (1.651 m)  PainSc: 0-No pain   Body mass index is 39.04 kg/m.  Advanced Directives 12/02/2021 10/26/2020 04/07/2020 07/18/2017  Does Patient Have a Medical Advance Directive? No No No No  Would patient like information on creating a medical advance directive? No - Patient declined - No - Patient declined -    Current Medications (verified) Outpatient Encounter Medications as of 11/29/2021  Medication Sig   aspirin 81 MG EC tablet Take 81 mg by mouth daily.    dapagliflozin propanediol (FARXIGA) 10 MG TABS tablet Take 1 tablet (10 mg total) by mouth daily.   digoxin (LANOXIN) 0.125 MG tablet Take 1 tablet by mouth once daily   Fremanezumab-vfrm (AJOVY) 225 MG/1.5ML SOAJ Inject 225 mg into the skin every 30 (thirty) days.   HYDROcodone-acetaminophen (NORCO) 7.5-325 MG tablet Take 1 tablet by mouth every 6 (six) hours as needed for moderate pain.   ivabradine (CORLANOR) 7.5 MG TABS tablet Take 1 tablet (7.5 mg total) by mouth 2 (two) times daily with a meal.   LORATADINE PO Take by mouth daily. [facility] brand   metoprolol succinate (TOPROL-XL) 50 MG 24 hr tablet Take 1 tablet by mouth twice daily   nitroGLYCERIN  (NITROSTAT) 0.4 MG SL tablet Place 0.4 mg under the tongue every 5 (five) minutes as needed for chest pain.   spironolactone (ALDACTONE) 25 MG tablet Take 1 tablet (25 mg total) by mouth daily.   torsemide (DEMADEX) 100 MG tablet Take 50 mg by mouth daily.   rosuvastatin (CRESTOR) 20 MG tablet Take 1 tablet by mouth once daily (Patient not taking: Reported on 11/29/2021)   No facility-administered encounter medications on file as of 11/29/2021.    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    Vertigo    Past Surgical History:  Procedure Laterality Date   ABDOMINAL HYSTERECTOMY     BREAST LUMPECTOMY Right 07/2021   CARDIAC DEFIBRILLATOR PLACEMENT     CEREBRAL ANEURYSM REPAIR Left 2009   CESAREAN SECTION     CORONARY ANGIOPLASTY WITH STENT PLACEMENT     PACEMAKER IMPLANT     RIGHT/LEFT HEART CATH AND CORONARY ANGIOGRAPHY N/A 04/07/2020  Procedure: RIGHT/LEFT HEART CATH AND CORONARY ANGIOGRAPHY;  Surgeon: Jolaine Artist, MD;  Location: [facility];  Service: Cardiovascular;  Laterality: N/A;   TUBAL LIGATION     Family History  Problem Relation Age of Onset   Thyroid disease Mother    Prostate cancer Father        Prostate   Cerebral aneurysm Paternal Grandmother        Nonruptured   Dementia Paternal Grandfather    Prostate cancer Paternal Grandfather    Hypertension Brother    Breast cancer Neg Hx    Social History   Socioeconomic History   Marital status: Married    Spouse name: Tim   Number of children: 4    Years of education: Not on file   Highest education level: High school graduate  Occupational History   Not on file  Tobacco Use   Smoking status: Former    Packs/day: 1.00    Years: 6.00    Pack years: 6.00    Types: Cigarettes   Smokeless tobacco: Never   Tobacco comments:    Quit over 30 years ago  Vaping Use   Vaping Use: Never used  Substance and Sexual Activity   Alcohol use: Yes    Comment: Occasionally "little"   Drug use: No   Sexual activity: Not Currently  Other Topics Concern   Not on file  Social History Narrative   Lives with husband in a one-story home.     Right handed   Caffeine: "very little"   Social Determinants of Health   Financial Resource Strain: High Risk   Difficulty of Paying Living Expenses: Hard  Food Insecurity: Not on file  Transportation Needs: No Transportation Needs   Lack of Transportation (Medical): No   Lack of Transportation (Non-Medical): No  Physical Activity: Not on file  Stress: Not on file  Social Connections: Not on file    Tobacco Counseling Counseling given: Not Answered Tobacco comments: Quit over 30 years ago   Clinical Intake:  Pre-visit preparation completed: Yes  Pain : No/denies pain Pain Score: 0-No pain     BMI - recorded: 39.04 Nutritional Status: BMI > 30  Obese Nutritional Risks: None Diabetes: Yes CBG done?: No Did pt. bring in CBG monitor from home?: No  How often do you need to have someone help you when you read instructions, pamphlets, or other written materials from your doctor or pharmacy?: 1 - Never Interpreter Needed?: No   Activities of Daily Living In your present state of health, do you have any difficulty performing the following activities: 11/29/2021  Hearing? N  Vision? N  Difficulty concentrating or making decisions? N  Walking or climbing stairs? N  Dressing or bathing? N  Doing errands, shopping? N  Preparing Food and eating ? N  Using the Toilet? N  In the past six  months, have you accidently leaked urine? N  Do you have problems with loss of bowel control? N  Managing your Medications? N  Managing your Finances? N  Housekeeping or managing your Housekeeping? N  Some recent data might be hidden    Patient Care Team: Lillard Anes, MD as PCP - General (Family Medicine) Constance Haw, MD as PCP - Electrophysiology (Cardiology) Bensimhon, Shaune Pascal, MD as PCP - Advanced Heart Failure (Cardiology) Marice Potter, MD as Consulting Physician (Oncology) Burnice Logan, [facility] (Inactive) as Pharmacist (Pharmacist) Gatha Mayer, MD as Consulting Physician (Radiation Oncology)  Indicate any recent  Medical Services you may have received from other than [facility] providers in the past year (date may be approximate).     Assessment:   This is a routine wellness examination for [place].  Hearing/Vision screen No results found.  Dietary issues and exercise activities discussed: Current Exercise Habits: Home exercise routine, Type of exercise: Other - see comments;walking (stationary bike), Time (Minutes): 30, Frequency (Times/Week): 4, Weekly Exercise (Minutes/Week): 120, Intensity: Mild, Exercise limited by: None identified   Goals Addressed   None    Depression Screen PHQ 2/9 Scores 11/29/2021 08/03/2021 10/26/2020 07/17/2020 07/17/2020 02/03/2020  PHQ - 2 Score 0 0 0 0 0 0  PHQ- 9 Score - - - 8 8 -    Fall Risk Fall Risk  12/02/2021 08/03/2021 10/26/2020 07/17/2020  Falls in the past year? 1 0 0 0  Number falls in past yr: 1 0 0 0  Injury with Fall? 0 0 0 0  Risk for fall due to : History of fall(s) - No Fall Risks -  Follow up Falls evaluation completed;Falls prevention discussed - - Falls evaluation completed    FALL RISK PREVENTION PERTAINING TO THE HOME:  Home free of loose throw rugs in walkways, pet beds, electrical cords, etc? Yes  Adequate lighting in your home to reduce risk of falls? Yes   ASSISTIVE DEVICES UTILIZED TO PREVENT  FALLS:  Life alert? No  Use of a cane, walker or w/c? No  Grab bars in the bathroom? No  Shower chair or bench in shower? No  Elevated toilet seat or a handicapped toilet? No   Gait steady and fast without use of assistive device  Cognitive Function:     6CIT Screen 12/02/2021 10/26/2020  What Year? 0 points 0 points  What month? 0 points 0 points  What time? 0 points 0 points  Count back from 20 0 points 0 points  Months in reverse 0 points 0 points  Repeat phrase 0 points 0 points  Total Score 0 0    Immunizations Immunization History  Administered Date(s) Administered   Influenza Inj Mdck Quad Pf 10/26/2020   PFIZER(Purple Top)SARS-COV-2 Vaccination 02/21/2020, 03/20/2020   Pneumococcal Polysaccharide-23 09/08/2019    TDAP status: Due, Education has been provided regarding the importance of this vaccine. Advised may receive this vaccine at local pharmacy or Health Dept. Aware to provide a copy of the vaccination record if obtained from local pharmacy or Health Dept. Verbalized acceptance and understanding.  Flu Vaccine status: Completed at today's visit  Pneumococcal vaccine status: Declined,  Education has been provided regarding the importance of this vaccine but patient still declined. Advised may receive this vaccine at local pharmacy or Health Dept. Aware to provide a copy of the vaccination record if obtained from local pharmacy or Health Dept. Verbalized acceptance and understanding.   Covid-19 vaccine status: Declined, Education has been provided regarding the importance of this vaccine but patient still declined. Advised may receive this vaccine at local pharmacy or Health Dept.or vaccine clinic. Aware to provide a copy of the vaccination record if obtained from local pharmacy or Health Dept. Verbalized acceptance and understanding.  Screening Tests Health Maintenance  Topic Date Due   URINE MICROALBUMIN  Never done   HIV Screening  Never done   Hepatitis C  Screening  Never done   TETANUS/TDAP  Never done   Zoster Vaccines- Shingrix (1 of 2) Never done   COVID-19 Vaccine (3 - Pfizer risk series) 04/17/2020   Pneumococcal Vaccine 19-64 Years  old (2 - PCV) 09/07/2020   HEMOGLOBIN A1C  04/25/2021   INFLUENZA VACCINE  06/18/2021   Fecal DNA (Cologuard)  09/24/2021   OPHTHALMOLOGY EXAM  10/25/2021   FOOT EXAM  04/03/2022   MAMMOGRAM  05/15/2023   HPV VACCINES  Aged Out   PAP SMEAR-Modifier  Discontinued    Health Maintenance  Health Maintenance Due  Topic Date Due   URINE MICROALBUMIN  Never done   HIV Screening  Never done   Hepatitis C Screening  Never done   TETANUS/TDAP  Never done   Zoster Vaccines- Shingrix (1 of 2) Never done   COVID-19 Vaccine (3 - Pfizer risk series) 04/17/2020   Pneumococcal Vaccine 10-43 Years old (2 - PCV) 09/07/2020   HEMOGLOBIN A1C  04/25/2021   INFLUENZA VACCINE  06/18/2021   Fecal DNA (Cologuard)  09/24/2021   OPHTHALMOLOGY EXAM  10/25/2021    Colorectal cancer screening: Type of screening: FOBT/FIT. Completed 10/2021. Repeat every one years  Mammogram status: Completed 2022. Repeat every year Lung Cancer Screening: (Low Dose CT Chest recommended if Age 50-80 years, 30 pack-year currently smoking OR have quit w/in 15years.) does not qualify.   Additional Screening:   Vision Screening: Recommended annual ophthalmology exams for early detection of glaucoma and other disorders of the eye. Is the patient up to date with their annual eye exam?  Yes   Dental Screening: Recommended annual dental exams for proper oral hygiene    Plan:     I have personally reviewed and noted the following in the patients chart:   Medical and social history Use of alcohol, tobacco or illicit drugs  Current medications and supplements including opioid prescriptions.  Functional ability and status Nutritional status Physical activity Advanced directives List of other physicians Hospitalizations, surgeries, and  ER visits in previous 12 months Vitals Screenings to include cognitive, depression, and falls Referrals and appointments  In addition, I have reviewed and discussed with patient certain preventive protocols, quality metrics, and best practice recommendations. A written personalized care plan for preventive services as well as general preventive health recommendations were provided to patient.     Erie Noe, LPN   [id number]

## 2021-12-03 ENCOUNTER — Other Ambulatory Visit: Payer: Self-pay

## 2021-12-03 ENCOUNTER — Encounter: Payer: Self-pay | Admitting: Legal Medicine

## 2021-12-03 ENCOUNTER — Ambulatory Visit (INDEPENDENT_AMBULATORY_CARE_PROVIDER_SITE_OTHER): Payer: HMO | Admitting: Legal Medicine

## 2021-12-03 VITALS — BP 120/80 | HR 74 | Temp 98.9°F | Resp 15 | Ht 65.0 in | Wt 230.0 lb

## 2021-12-03 DIAGNOSIS — G43109 Migraine with aura, not intractable, without status migrainosus: Secondary | ICD-10-CM | POA: Diagnosis not present

## 2021-12-03 DIAGNOSIS — I110 Hypertensive heart disease with heart failure: Secondary | ICD-10-CM

## 2021-12-03 DIAGNOSIS — Z171 Estrogen receptor negative status [ER-]: Secondary | ICD-10-CM

## 2021-12-03 DIAGNOSIS — E876 Hypokalemia: Secondary | ICD-10-CM

## 2021-12-03 DIAGNOSIS — I5022 Chronic systolic (congestive) heart failure: Secondary | ICD-10-CM | POA: Diagnosis not present

## 2021-12-03 DIAGNOSIS — E782 Mixed hyperlipidemia: Secondary | ICD-10-CM

## 2021-12-03 DIAGNOSIS — C50611 Malignant neoplasm of axillary tail of right female breast: Secondary | ICD-10-CM | POA: Diagnosis not present

## 2021-12-03 DIAGNOSIS — I773 Arterial fibromuscular dysplasia: Secondary | ICD-10-CM | POA: Diagnosis not present

## 2021-12-03 DIAGNOSIS — D0510 Intraductal carcinoma in situ of unspecified breast: Secondary | ICD-10-CM | POA: Insufficient documentation

## 2021-12-03 NOTE — Assessment & Plan Note (Signed)
The current medical regimen is effective;  continue present plan and medications.  

## 2021-12-03 NOTE — Assessment & Plan Note (Addendum)
Management for specialist. EF 25% 09/2021.

## 2021-12-03 NOTE — Assessment & Plan Note (Signed)
Management per specialist. 

## 2021-12-04 LAB — CBC WITH DIFFERENTIAL/PLATELET
Basophils Absolute: 0 10*3/uL (ref 0.0–0.2)
Basos: 0 %
EOS (ABSOLUTE): 0.2 10*3/uL (ref 0.0–0.4)
Eos: 2 %
Hematocrit: 51.5 % — ABNORMAL HIGH (ref 34.0–46.6)
Hemoglobin: 18 g/dL — ABNORMAL HIGH (ref 11.1–15.9)
Immature Grans (Abs): 0 10*3/uL (ref 0.0–0.1)
Immature Granulocytes: 0 %
Lymphocytes Absolute: 1.5 10*3/uL (ref 0.7–3.1)
Lymphs: 16 %
MCH: 31.6 pg (ref 26.6–33.0)
MCHC: 35 g/dL (ref 31.5–35.7)
MCV: 91 fL (ref 79–97)
Monocytes Absolute: 0.8 10*3/uL (ref 0.1–0.9)
Monocytes: 9 %
Neutrophils Absolute: 6.6 10*3/uL (ref 1.4–7.0)
Neutrophils: 73 %
Platelets: 261 10*3/uL (ref 150–450)
RBC: 5.69 x10E6/uL — ABNORMAL HIGH (ref 3.77–5.28)
RDW: 12.2 % (ref 11.7–15.4)
WBC: 9.2 10*3/uL (ref 3.4–10.8)

## 2021-12-04 LAB — LIPID PANEL
Chol/HDL Ratio: 3.9 ratio (ref 0.0–4.4)
Cholesterol, Total: 181 mg/dL (ref 100–199)
HDL: 46 mg/dL (ref >39)
LDL Chol Calc (NIH): 93 mg/dL (ref 0–99)
Triglycerides: 251 mg/dL — ABNORMAL HIGH (ref 0–149)
VLDL Cholesterol Cal: 42 mg/dL — ABNORMAL HIGH (ref 5–40)

## 2021-12-04 LAB — COMPREHENSIVE METABOLIC PANEL
ALT: 28 IU/L (ref 0–32)
AST: 25 IU/L (ref 0–40)
Albumin/Globulin Ratio: 1.6 (ref 1.2–2.2)
Albumin: 4.7 g/dL (ref 3.8–4.9)
Alkaline Phosphatase: 85 IU/L (ref 44–121)
BUN/Creatinine Ratio: 20 (ref 12–28)
BUN: 21 mg/dL (ref 8–27)
Bilirubin Total: 0.6 mg/dL (ref 0.0–1.2)
CO2: 29 mmol/L (ref 20–29)
Calcium: 10.5 mg/dL — ABNORMAL HIGH (ref 8.7–10.3)
Chloride: 94 mmol/L — ABNORMAL LOW (ref 96–106)
Creatinine, Ser: 1.03 mg/dL — ABNORMAL HIGH (ref 0.57–1.00)
Globulin, Total: 2.9 g/dL (ref 1.5–4.5)
Glucose: 159 mg/dL — ABNORMAL HIGH (ref 70–99)
Potassium: 4 mmol/L (ref 3.5–5.2)
Sodium: 139 mmol/L (ref 134–144)
Total Protein: 7.6 g/dL (ref 6.0–8.5)
eGFR: 62 mL/min/{1.73_m2} (ref >59)

## 2021-12-04 LAB — HEMOGLOBIN A1C
Est. average glucose Bld gHb Est-mCnc: 163 mg/dL
Hgb A1c MFr Bld: 7.3 % — ABNORMAL HIGH (ref 4.8–5.6)

## 2021-12-04 LAB — CARDIOVASCULAR RISK ASSESSMENT

## 2021-12-04 NOTE — Progress Notes (Signed)
Glucose 159, creatinine 1.03,liver tests normal, A1c 7.3, triglycerides 251 high, still has high rbcs,

## 2021-12-13 ENCOUNTER — Ambulatory Visit (INDEPENDENT_AMBULATORY_CARE_PROVIDER_SITE_OTHER): Payer: HMO

## 2021-12-13 DIAGNOSIS — I420 Dilated cardiomyopathy: Secondary | ICD-10-CM | POA: Diagnosis not present

## 2021-12-13 LAB — CUP PACEART REMOTE DEVICE CHECK
Date Time Interrogation Session: 20230126072800
Implantable Lead Implant Date: 20180410
Implantable Lead Implant Date: 20180410
Implantable Lead Location: 753859
Implantable Lead Location: 753860
Implantable Lead Model: 377
Implantable Lead Model: 402266
Implantable Lead Serial Number: 49794726
Implantable Lead Serial Number: 49838890
Implantable Pulse Generator Implant Date: 20180410
Pulse Gen Model: 404622
Pulse Gen Serial Number: 60982098

## 2021-12-17 ENCOUNTER — Other Ambulatory Visit: Payer: Self-pay | Admitting: Legal Medicine

## 2021-12-17 DIAGNOSIS — G894 Chronic pain syndrome: Secondary | ICD-10-CM

## 2021-12-18 MED ORDER — HYDROCODONE-ACETAMINOPHEN 7.5-325 MG PO TABS: 1.0000 | ORAL_TABLET | Freq: Four times a day (QID) | ORAL | 0 refills | Status: DC | PRN

## 2021-12-24 NOTE — Progress Notes (Signed)
Remote ICD transmission.   

## 2021-12-25 DIAGNOSIS — D0511 Intraductal carcinoma in situ of right breast: Secondary | ICD-10-CM | POA: Diagnosis not present

## 2021-12-26 ENCOUNTER — Other Ambulatory Visit (HOSPITAL_COMMUNITY): Payer: Self-pay

## 2021-12-26 MED ORDER — DAPAGLIFLOZIN PROPANEDIOL 10 MG PO TABS: 10.0000 mg | ORAL_TABLET | Freq: Every day | ORAL | 3 refills | Status: DC

## 2022-01-02 ENCOUNTER — Telehealth: Payer: Self-pay

## 2022-01-02 NOTE — Chronic Care Management (AMB) (Signed)
° ° °  Chronic Care Management Pharmacy Assistant   Name: Allison White  MRN: [id number] DOB: 31-Mar-1961   Reason for Encounter: General Adherence Call    Recent office visits:  12/03/21 Reinaldo Meeker MD. Seen for HLD, HTN and CHF. No med changes.   11/29/21 Rhae Hammock LPN. Seen for Annual Wellness Visit. No med changes.   Recent consult visits:  None  Hospital visits:  None  Medications: Outpatient Encounter Medications as of 01/02/2022  Medication Sig   aspirin 81 MG EC tablet Take 81 mg by mouth daily.    dapagliflozin propanediol (FARXIGA) 10 MG TABS tablet Take 1 tablet (10 mg total) by mouth daily.   digoxin (LANOXIN) 0.125 MG tablet Take 1 tablet by mouth once daily   Fremanezumab-vfrm (AJOVY) 225 MG/1.5ML SOAJ Inject 225 mg into the skin every 30 (thirty) days.   HYDROcodone-acetaminophen (NORCO) 7.5-325 MG tablet Take 1 tablet by mouth every 6 (six) hours as needed for moderate pain.   ivabradine (CORLANOR) 7.5 MG TABS tablet Take 1 tablet (7.5 mg total) by mouth 2 (two) times daily with a meal.   LORATADINE PO Take by mouth daily. [facility] brand   metoprolol succinate (TOPROL-XL) 50 MG 24 hr tablet Take 1 tablet by mouth twice daily   nitroGLYCERIN (NITROSTAT) 0.4 MG SL tablet Place 0.4 mg under the tongue every 5 (five) minutes as needed for chest pain.   rosuvastatin (CRESTOR) 20 MG tablet Take 1 tablet by mouth once daily   spironolactone (ALDACTONE) 25 MG tablet Take 1 tablet (25 mg total) by mouth daily.   torsemide (DEMADEX) 100 MG tablet Take 50 mg by mouth daily.   No facility-administered encounter medications on file as of 01/02/2022.    Contacted Allison White for general disease state and medication adherence call.   Patient is not > 5 days past due for refill on the following medications per chart history:  Star Medications: Medication Name/mg Last Fill Days Supply Rosuvastatin 20 mg               11/08/21          90ds 08/06/21             90ds Farxiga 10 mg                        ( PAP )   What concerns do you have about your medications? Pt denies any concersn   The patient denies side effects with her medications.   How often do you forget or accidentally miss a dose? Never  Do you use a pillbox? Yes  Are you having any problems getting your medications from your pharmacy? No  Has the cost of your medications been a concern? No  Since last visit with CPP, no interventions have been made:   The patient has not had an ED visit since last contact.   The patient denies problems with their health.   she denies  concerns or questions for [place] Constable, at this time.   Patient states BP readings 110/70 was the last one she took and she does not check her Blood Sugars. Pt stated she is doing well and her appt with CPP is scheduled in April.    Care Gaps: Last annual wellness visit? 11/29/21 Mammogram: 05/14/21 Last eye exam / retinopathy screening? 10/25/20 Diabetic foot exam?04/03/21  Elray Mcgregor, [place] Pharmacist Assistant  [phone]

## 2022-01-09 ENCOUNTER — Other Ambulatory Visit: Payer: Self-pay

## 2022-01-09 ENCOUNTER — Ambulatory Visit (INDEPENDENT_AMBULATORY_CARE_PROVIDER_SITE_OTHER): Payer: HMO | Admitting: Legal Medicine

## 2022-01-09 ENCOUNTER — Telehealth: Payer: Self-pay

## 2022-01-09 ENCOUNTER — Encounter: Payer: Self-pay | Admitting: Legal Medicine

## 2022-01-09 ENCOUNTER — Ambulatory Visit: Payer: HMO | Admitting: Family Medicine

## 2022-01-09 VITALS — BP 130/80 | HR 93 | Temp 98.3°F | Resp 15 | Ht 65.0 in | Wt 229.0 lb

## 2022-01-09 DIAGNOSIS — K5792 Diverticulitis of intestine, part unspecified, without perforation or abscess without bleeding: Secondary | ICD-10-CM | POA: Diagnosis not present

## 2022-01-09 MED ORDER — CIPROFLOXACIN HCL 500 MG PO TABS: 500.0000 mg | ORAL_TABLET | Freq: Two times a day (BID) | ORAL | 0 refills | Status: AC

## 2022-01-09 MED ORDER — METRONIDAZOLE 500 MG PO TABS: 500.0000 mg | ORAL_TABLET | Freq: Three times a day (TID) | ORAL | 0 refills | Status: AC

## 2022-01-09 NOTE — Telephone Encounter (Signed)
Patient asked for antibiotics to be sent to the pharmacy because her diverticulitis is flare up.

## 2022-01-09 NOTE — Progress Notes (Addendum)
Subjective:  Patient ID: Allison White, female    DOB: 11-04-1961  Age: 61 y.o. MRN: [id number]  Chief Complaint  Patient presents with   Abdominal Pain    HPI Patient has sharp abdominal pain on LLQ since 3 days ago. She mentioned to stand up straight hurts more. She took Levofloxacin 750 mg #1 and it helps a little bit. No bleeding but has cramping.  She has a history of diverticulitis in past. No vomiting.  No diarrhea. Current Outpatient Medications on File Prior to Visit  Medication Sig Dispense Refill   aspirin 81 MG EC tablet Take 81 mg by mouth daily.      dapagliflozin propanediol (FARXIGA) 10 MG TABS tablet Take 1 tablet (10 mg total) by mouth daily. 90 tablet 3   digoxin (LANOXIN) 0.125 MG tablet Take 1 tablet by mouth once daily 90 tablet 3   HYDROcodone-acetaminophen (NORCO) 7.5-325 MG tablet Take 1 tablet by mouth every 6 (six) hours as needed for moderate pain. 90 tablet 0   ivabradine (CORLANOR) 7.5 MG TABS tablet Take 1 tablet (7.5 mg total) by mouth 2 (two) times daily with a meal. 180 tablet 3   LORATADINE PO Take by mouth daily. [facility] brand     metoprolol succinate (TOPROL-XL) 50 MG 24 hr tablet Take 1 tablet by mouth twice daily 180 tablet 2   nitroGLYCERIN (NITROSTAT) 0.4 MG SL tablet Place 0.4 mg under the tongue every 5 (five) minutes as needed for chest pain.     rosuvastatin (CRESTOR) 20 MG tablet Take 1 tablet by mouth once daily 90 tablet 2   spironolactone (ALDACTONE) 25 MG tablet Take 1 tablet (25 mg total) by mouth daily. 30 tablet 6   torsemide (DEMADEX) 100 MG tablet Take 50 mg by mouth daily.     No current facility-administered medications on file prior to visit.   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    Vertigo    Past Surgical History:  Procedure Laterality Date   ABDOMINAL HYSTERECTOMY     BREAST LUMPECTOMY Right 07/2021   CARDIAC DEFIBRILLATOR PLACEMENT     CEREBRAL ANEURYSM REPAIR Left 2009   CESAREAN SECTION     CORONARY ANGIOPLASTY WITH STENT PLACEMENT     PACEMAKER IMPLANT     RIGHT/LEFT HEART CATH AND CORONARY ANGIOGRAPHY N/A 04/07/2020   Procedure: RIGHT/LEFT HEART CATH AND CORONARY ANGIOGRAPHY;  Surgeon: Jolaine Artist, MD;  Location: [facility];  Service: Cardiovascular;  Laterality: N/A;   TUBAL LIGATION      Family History  Problem Relation Age of Onset   Thyroid disease Mother    Prostate cancer Father        Prostate   Cerebral aneurysm Paternal Grandmother        Nonruptured   Dementia Paternal Grandfather    Prostate cancer Paternal Grandfather    Hypertension Brother    Breast cancer Neg Hx    Social History   Socioeconomic History   Marital status: Married    Spouse name: Tim   Number of  children: 4   Years of education: Not on file   Highest education level: High school graduate  Occupational History   Not on file  Tobacco Use   Smoking status: Former    Packs/day: 1.00    Years: 6.00    Pack years: 6.00    Types: Cigarettes   Smokeless tobacco: Never   Tobacco comments:    Quit over 30 years ago  Vaping Use   Vaping Use: Never used  Substance and Sexual Activity   Alcohol use: Yes    Comment: Occasionally "little"   Drug use: No   Sexual activity: Not Currently  Other Topics Concern   Not on file  Social History Narrative   Lives with husband in a one-story home.     Right handed   Caffeine: "very little"   Social Determinants of Health   Financial Resource Strain: High Risk   Difficulty of Paying Living Expenses: Hard  Food Insecurity: Not on file  Transportation Needs: No  Transportation Needs   Lack of Transportation (Medical): No   Lack of Transportation (Non-Medical): No  Physical Activity: Not on file  Stress: Not on file  Social Connections: Not on file    Review of Systems  Constitutional:  Negative for chills, fatigue and fever.  HENT:  Negative for congestion, ear pain and sore throat.   Respiratory:  Negative for cough and shortness of breath.   Cardiovascular:  Negative for chest pain and palpitations.  Gastrointestinal:  Positive for abdominal pain, constipation and nausea. Negative for diarrhea and vomiting.  Endocrine: Negative for polydipsia, polyphagia and polyuria.  Genitourinary:  Negative for difficulty urinating and dysuria.  Musculoskeletal:  Negative for arthralgias, back pain and myalgias.  Skin:  Negative for rash.  Neurological:  Positive for numbness and headaches.  Psychiatric/Behavioral:  Negative for dysphoric mood. The patient is not nervous/anxious.     Objective:  BP 130/80    Pulse 93    Temp 98.3 F (36.8 C)    Resp 15    Ht 5\' 5"  (1.651 m)    Wt 229 lb (103.9 kg)    SpO2 96%    BMI 38.11 kg/m   BP/Weight 01/09/2022 12/03/2021 1/61/0960  Systolic BP 454 098 119  Diastolic BP 80 80 78  Wt. (Lbs) 229 230 234.6  BMI 38.11 38.27 39.04    Physical Exam Vitals reviewed.  Constitutional:      Appearance: She is well-developed. She is obese.  HENT:     Right Ear: Tympanic membrane normal.     Nose: Nose normal.     Mouth/Throat:     Mouth: Mucous membranes are moist.  Eyes:     Extraocular Movements: Extraocular movements intact.     Pupils: Pupils are equal, round, and reactive to light.  Cardiovascular:     Rate and Rhythm: Normal rate.     Pulses: Normal pulses.     Heart sounds: Normal heart sounds. No murmur heard.   No gallop.  Pulmonary:     Effort: Pulmonary effort is normal. No respiratory distress.     Breath sounds: Normal breath sounds. No wheezing.  Abdominal:     General: Abdomen is flat and  scaphoid. Bowel sounds are absent.     Palpations: Abdomen is rigid. There is no shifting dullness or hepatomegaly.     Tenderness: There is abdominal tenderness.     Comments: No BS, pain LLQ  Skin:    General: Skin is warm.  Capillary Refill: Capillary refill takes less than 2 seconds.  Neurological:     General: No focal deficit present.     Mental Status: She is alert. Mental status is at baseline.        Lab Results  Component Value Date   WBC 9.2 12/03/2021   HGB 18.0 (H) 12/03/2021   HCT 51.5 (H) 12/03/2021   PLT 261 12/03/2021   GLUCOSE 159 (H) 12/03/2021   CHOL 181 12/03/2021   TRIG 251 (H) 12/03/2021   HDL 46 12/03/2021   LDLCALC 93 12/03/2021   ALT 28 12/03/2021   AST 25 12/03/2021   NA 139 12/03/2021   K 4.0 12/03/2021   CL 94 (L) 12/03/2021   CREATININE 1.03 (H) 12/03/2021   BUN 21 12/03/2021   CO2 29 12/03/2021   TSH 2.880 07/17/2020   INR 1.0 04/04/2020   HGBA1C 7.3 (H) 12/03/2021      Assessment & Plan:   Problem List Items Addressed This Visit   None Visit Diagnoses     Acute diverticulitis    -  Primary   Relevant Medications   ciprofloxacin (CIPRO) 500 MG tablet   metroNIDAZOLE (FLAGYL) 500 MG tablet Patient has acute diverticulitis and has had this several times in the past she is not passing any blood or having any complications she is on a clear liquid diet.  We will keep her on Cipro and Flagyl and see her back in 2 weeks.     .       Follow-up: Return if symptoms worsen or fail to improve.  An After Visit Summary was printed and given to the patient.  Reinaldo Meeker, MD [facility] [phone]

## 2022-01-09 NOTE — Telephone Encounter (Signed)
Can we see her  lp

## 2022-01-09 NOTE — Telephone Encounter (Signed)
I made an appointment to see Dr Henrene Pastor.

## 2022-01-10 NOTE — Telephone Encounter (Signed)
Advanced Heart Failure Patient Advocate Encounter   Patient was approved to receive Corlanor from [employer]  Effective dates: 11/18/21 through 11/17/22  Patient had last shipment sent 12/28/21.  Charlann Boxer, CPhT

## 2022-01-17 ENCOUNTER — Telehealth: Payer: Self-pay

## 2022-01-17 ENCOUNTER — Other Ambulatory Visit: Payer: Self-pay

## 2022-01-17 MED ORDER — FLUCONAZOLE 150 MG PO TABS: 150.0000 mg | ORAL_TABLET | Freq: Every day | ORAL | 0 refills | Status: DC

## 2022-01-17 NOTE — Telephone Encounter (Signed)
Patient was informed. I sent prescription to the pharmacy. 

## 2022-01-17 NOTE — Telephone Encounter (Signed)
Patient is taking ciprofloxacin for diverticulosis. She noticed this morning that her tongue is white on the top and she cannot taste anything. Please advice. ?

## 2022-01-17 NOTE — Telephone Encounter (Signed)
Probably has yeast growth, I called in diflucan for one week ?lp ?

## 2022-01-24 ENCOUNTER — Other Ambulatory Visit (HOSPITAL_COMMUNITY): Payer: Self-pay | Admitting: Internal Medicine

## 2022-01-29 ENCOUNTER — Other Ambulatory Visit (HOSPITAL_COMMUNITY): Payer: Self-pay | Admitting: Internal Medicine

## 2022-01-29 ENCOUNTER — Telehealth: Payer: Self-pay

## 2022-01-29 NOTE — Progress Notes (Signed)
? ? ?  Chronic Care Management ?Pharmacy Assistant  ? ?Name: Allison White  MRN: [id number] DOB: 08/12/1961 ? ?Reason for Encounter: General Adherence Call  ?  ?Recent office visits:  ?01/17/22 Leal-Borjas CMA. Orders Only. Started Diflucan '150mg'$  for 5 days.  ? ?01/09/22 Reinaldo Meeker MD. Seen for Acute Diverticulitis. Started on Ciprofloxacin HCI '500mg'$  and Metronidazole '500mg'$ . ? ?Recent consult visits:  ?None ? ?Hospital visits:  ?None ? ?Medications: ?Outpatient Encounter Medications as of 01/29/2022  ?Medication Sig  ? aspirin 81 MG EC tablet Take 81 mg by mouth daily.   ? dapagliflozin propanediol (FARXIGA) 10 MG TABS tablet Take 1 tablet (10 mg total) by mouth daily.  ? digoxin (LANOXIN) 0.125 MG tablet Take 1 tablet by mouth once daily  ? fluconazole (DIFLUCAN) 150 MG tablet Take 1 tablet (150 mg total) by mouth daily.  ? HYDROcodone-acetaminophen (NORCO) 7.5-325 MG tablet Take 1 tablet by mouth every 6 (six) hours as needed for moderate pain.  ? ivabradine (CORLANOR) 7.5 MG TABS tablet Take 1 tablet (7.5 mg total) by mouth 2 (two) times daily with a meal.  ? LORATADINE PO Take by mouth daily. [facility] brand  ? metoprolol succinate (TOPROL-XL) 50 MG 24 hr tablet Take 1 tablet by mouth twice daily  ? nitroGLYCERIN (NITROSTAT) 0.4 MG SL tablet Place 0.4 mg under the tongue every 5 (five) minutes as needed for chest pain.  ? rosuvastatin (CRESTOR) 20 MG tablet Take 1 tablet by mouth once daily  ? spironolactone (ALDACTONE) 25 MG tablet Take 1 tablet (25 mg total) by mouth daily.  ? torsemide (DEMADEX) 100 MG tablet Take 50 mg by mouth daily.  ? ?No facility-administered encounter medications on file as of 01/29/2022.  ? ? ?Contacted Allison White for general disease state and medication adherence call.  ? ?Patient is not > 5 days past due for refill on the following medications per chart history: ? ?Star Medications: ?Medication Name/mg Last Fill Days Supply ?Rosuvastatin 20 mg               11/08/21           90ds ?08/06/21            90ds ?Farxiga 10 mg                        ( PAP ) ? ? ?What concerns do you have about your medications? Pt denies any concerns  ? ?The patient denies side effects with her medications.  ? ?How often do you forget or accidentally miss a dose? Never ? ?Do you use a pillbox? Yes ? ?Are you having any problems getting your medications from your pharmacy? No ? ?Has the cost of your medications been a concern? No ? ?Since last visit with CPP, no interventions have been made:  ? ?The patient has not had an ED visit since last contact.  ? ?The patient reports the following problems with their health. Pt just had a flare up of diverticulitis and is on antimitotics right now. She has not checked her BP lately. ? ?she denies  concerns or questions for [place] Constable, at this time.  ? ? ? ?Care Gaps: ?Last annual wellness visit? 11/29/21 ?Mammogram: 05/14/21 ?Last eye exam / retinopathy screening? 10/25/20 ?Diabetic foot exam?04/03/21 ?  ?Elray Mcgregor, CMA ?Clinical Pharmacist Assistant  ?[phone]  ?

## 2022-02-05 ENCOUNTER — Other Ambulatory Visit: Payer: Self-pay | Admitting: Legal Medicine

## 2022-02-05 DIAGNOSIS — G894 Chronic pain syndrome: Secondary | ICD-10-CM

## 2022-02-05 MED ORDER — HYDROCODONE-ACETAMINOPHEN 7.5-325 MG PO TABS: 1.0000 | ORAL_TABLET | Freq: Four times a day (QID) | ORAL | 0 refills | Status: DC | PRN

## 2022-02-18 ENCOUNTER — Ambulatory Visit (INDEPENDENT_AMBULATORY_CARE_PROVIDER_SITE_OTHER): Payer: HMO

## 2022-02-18 DIAGNOSIS — E782 Mixed hyperlipidemia: Secondary | ICD-10-CM

## 2022-02-18 DIAGNOSIS — I5022 Chronic systolic (congestive) heart failure: Secondary | ICD-10-CM

## 2022-02-18 DIAGNOSIS — I110 Hypertensive heart disease with heart failure: Secondary | ICD-10-CM

## 2022-02-18 NOTE — Patient Instructions (Signed)
Visit Information ? ? Goals Addressed   ?None ?  ? ?Patient Care Plan: [place]  ?  ? ?Problem Identified: htn, hld, heart failure   ?Priority: High  ?Onset Date: 07/06/2021  ?  ? ?Long-Range Goal: Disease State Management   ?Start Date: 07/06/2021  ?Expected End Date: 07/06/2022  ?Recent Progress: On track  ?Priority: High  ?Note:   ?Current Barriers:  ?Unable to independently afford treatment regimen ? ?Pharmacist Clinical Goal(s):  ?Patient will verbalize ability to afford treatment regimen through collaboration with PharmD and provider.  ? ?Interventions: ?1:1 collaboration with Allison Anes, MD regarding development and update of comprehensive plan of care as evidenced by provider attestation and co-signature ?Inter-disciplinary care team collaboration (see longitudinal plan of care) ?Comprehensive medication review performed; medication list updated in electronic medical record ? ?(BP goal <130/80) ?BP Readings from Last 3 Encounters:  ?01/09/22 130/80  ?12/03/21 120/80  ?11/29/21 122/78  ?-Controlled ?-Current treatment: ?Metoprolol succinate 50 mg twice daily Appropriate, Effective, Safe, Accessible ?Spironolactone 25 mg daily Appropriate, Effective, Safe, Accessible ?-Medications previously tried: valsartan, Entresto ?-Current home readings:  ?March 2023: 110/60 ?-Current dietary habits: eats small portions but tries to keep healthy diet ?-Current exercise habits: limited  ?-Denies hypotensive/hypertensive symptoms ?-Educated on BP goals and benefits of medications for prevention of heart attack, stroke and kidney damage; ?Daily salt intake goal < 2300 mg; ?Exercise goal of 150 minutes per week; ?-Counseled to monitor BP at home weekly, document, and provide log at future appointments ?-Recommended to continue current medication ? ?Hyperlipidemia: (LDL goal < 55) ?The 10-year ASCVD risk score (Arnett DK, et al., 2019) is: 9% ?  Values used to calculate the score: ?    Age: 61 years ?     Sex: Female ?    Is Non-Hispanic African American: No ?    Diabetic: Yes ?    Tobacco smoker: No ?    Systolic Blood Pressure: 638 mmHg ?    Is BP treated: Yes ?    HDL Cholesterol: 46 mg/dL ?    Total Cholesterol: 181 mg/dL ?Lab Results  ?Component Value Date  ? CHOL 181 12/03/2021  ? CHOL 190 08/03/2021  ? CHOL 190 01/01/2021  ? ?Lab Results  ?Component Value Date  ? HDL 46 12/03/2021  ? HDL 45 08/03/2021  ? HDL 47 01/01/2021  ? ?Lab Results  ?Component Value Date  ? [place] 93 12/03/2021  ? LDLCALC 110 (H) 08/03/2021  ? LDLCALC 103 (H) 01/01/2021  ? ?Lab Results  ?Component Value Date  ? TRIG 251 (H) 12/03/2021  ? TRIG 203 (H) 08/03/2021  ? TRIG 236 (H) 01/01/2021  ? ?Lab Results  ?Component Value Date  ? CHOLHDL 3.9 12/03/2021  ? CHOLHDL 4.2 08/03/2021  ? CHOLHDL 4.0 01/01/2021  ?No results found for: LDLDIRECT ?-Not ideally controlled ?-Current treatment: ?Rosuvastatin '20mg'$  (Not taking due to muscle aches) Appropriate, Effective, Query Safe, Accessible ?Aspirin ec 81 mg daily Appropriate, Effective, Safe, Accessible ?-Medications previously tried: Atorvastatin (Muscle aches) ?-Current dietary patterns: little appetite but patient is trying to eat healthy ?-Current exercise habits: limited ?-Educated on Cholesterol goals;  ?Benefits of statin for ASCVD risk reduction; ?Exercise goal of 150 minutes per week; ?March 2023: Patient stopped Rosuvastatin due to muscle aches. Recommend repeat thyroid and Vit D ? ?Heart Failure (Goal: manage symptoms and prevent exacerbations) ?-Controlled ?-Last ejection fraction: 25%  (Date: 10/2020) ?-HF type: Systolic ?-NYHA Class: III (marked limitation of activity) ?-Current treatment: ?Farxiga 10 mg daily  Appropriate, Effective, Safe, Accessible ?Digoxin 0.125 mg daily Appropriate, Effective, Safe, Accessible ?Ivabradine 7.5 mg bid with a meal Appropriate, Effective, Safe, Accessible ?Metoprolol Succinate 50 mg bid Appropriate, Effective, Safe, Accessible ?Nitroglcyerin 0.4 mg  every 5 minutes prn chest pain Appropriate, Effective, Safe, Accessible ?Torsemide 50 mg daily Appropriate, Effective, Safe, Accessible ?Spironolactone 25 mg daily Appropriate, Effective, Safe, Accessible ?-Medications previously tried: Entresto, valsartan ?-Current home BP/HR readings: patient reports bp runs low ?-Current dietary habits: very little appetite ?-Current exercise habits: limited  ?-Educated on Benefits of medications for managing symptoms and prolonging life ?Importance of weighing daily; if you gain more than 3 pounds in one day or 5 pounds in one week,  ?-Recommended to continue current medication ? ?Migraine (Goal: prevent/manage migraines) ?-Not ideally controlled ?-Current treatment  ?Ajovy 225 mg every 30 days - (PAP) Appropriate, Effective, Safe, Accessible ?Hydrocodone 7.5/325 mg every 6 hours prn Query Appropriate, Query effective, Query Safe, Query accessible ?-Medications previously tried: none reported  ?March 2023: Main priority for this patient today was statin, would like to get off CS for headache management in the future ? ?Breast Cancer ?-Managed by Lavera Guise ?-Lumpectomy September 2022 ?-Current treatment  ?N/A ?-Tried/Failed: Tamoxifen '20mg'$  (Stopped November 2022 due to [facility]) ?-Radiation starting mid October 2022 ?-Recommended to continue current medication ? ?Patient Goals/Self-Care Activities ?Patient will:  ?- take medications as prescribed ?focus on medication adherence by using pill box ?target a minimum of 150 minutes of moderate intensity exercise weekly ?engage in dietary modifications by ** ? ?Follow Up Plan: Telephone follow up appointment with care management team member scheduled for: 6 months ? ?CPP F/U October 2023 (Re-do PAP) ? ?[place] Constable, Pharm.D. - [phone] ? ?  ? ? ?Allison White was given information about Chronic Care Management services today including:  ?CCM service includes personalized support from designated clinical staff supervised by her  physician, including individualized plan of care and coordination with other care providers ?24/7 contact phone numbers for assistance for urgent and routine care needs. ?Standard insurance, coinsurance, copays and deductibles apply for chronic care management only during months in which we provide at least 20 minutes of these services. Most insurances cover these services at 100%, however patients may be responsible for any copay, coinsurance and/or deductible if applicable. This service may help you avoid the need for more expensive face-to-face services. ?Only one practitioner may furnish and bill the service in a calendar month. ?The patient may stop CCM services at any time (effective at the end of the month) by phone call to the office staff. ? ?Patient agreed to services and verbal consent obtained.  ? ?The patient verbalized understanding of instructions, educational materials, and care plan provided today and declined offer to receive copy of patient instructions, educational materials, and care plan.  ?The pharmacy team will reach out to the patient again over the next 60 days.  ? ?Lane Hacker, [place] ? ?

## 2022-02-18 NOTE — Progress Notes (Signed)
? ?Chronic Care Management ?Pharmacy Note ? ?02/18/2022 ?Name:  Allison White MRN:  [id number] DOB:  11-02-1961 ? ?Recommendations: ?Recommend Vit D and Thyroid due to myalgias on statin (Stopped taking Rosuvastatin  ? ? ? ?Subjective: ?Allison White is an 61 y.o. year old female who is a primary patient of Allison White, Zeb Comfort, MD.  The CCM team was consulted for assistance with disease management and care coordination needs.   ? ?Engaged with patient face to face for follow up visit in response to provider referral for pharmacy case management and/or care coordination services.  ? ?Consent to Services:  ?The patient was given the following information about Chronic Care Management services today, agreed to services, and gave verbal consent: 1. CCM service includes personalized support from designated clinical staff supervised by the primary care provider, including individualized plan of care and coordination with other care providers 2. 24/7 contact phone numbers for assistance for urgent and routine care needs. 3. Service will only be billed when office clinical staff spend 20 minutes or more in a month to coordinate care. 4. Only one practitioner may furnish and bill the service in a calendar month. 5.The patient may stop CCM services at any time (effective at the end of the month) by phone call to the office staff. 6. The patient will be responsible for cost sharing (co-pay) of up to 20% of the service fee (after annual deductible is met). Patient agreed to services and consent obtained. ? ?Patient Care Team: ?Lillard Anes, MD as PCP - General (Family Medicine) ?Constance Haw, MD as PCP - Electrophysiology (Cardiology) ?Bensimhon, Shaune Pascal, MD as PCP - Advanced Heart Failure (Cardiology) ?Marice Potter, MD as Consulting Physician (Oncology) ?Gatha Mayer, MD as Consulting Physician (Radiation Oncology) ?Lane Hacker, [facility] (Pharmacist) ? ?Recent office visits:  ?04/03/21- Reinaldo Meeker, MD - seen for follow up of hypertension, started valsartan 40 mg daily, referral to gastroenterology, labs ordered, mammogram ordered, follow up 4 months ?01/01/21- Reinaldo Meeker, MD- seen for chronic visit, labs ordered, no medication changes, follow up 3 months ?  ?Recent consult visits:  ?06/14/21- Patient Message- Allena Katz, FNP (Cardiology) increased corlanor back to 7.5 due to increased HR ?06/01/21- Patient Message- Glori Bickers, MD (Cardiology) discontinued valsartan due to  BP irregularity ?05/22/21- Telephone encounter- Fransico Him, MD (Cardiology) ordered PSG after normal sleep study ?05/14/21- Debbora Presto, NP (Neurology)- seen for migraine follow up, started Ajovy 225 mg/ 1.5 ml one self administered injection every 30 days, follow up 3 months ?05/07/21- Will Meredith Leeds, MD (Cardiology)- seen for  evaluation of systolic heart failure, no medication changes, follow up 1 yr  ?02/20/21-Patient Message- Glori Bickers, MD (Cardiology) discontinued entresto and started losartan 25 mg daily at bedtime due to low BP ?02/05/21- Glori Bickers, MD (Cardiology)-  seen for heart failure follow up, labs ordered, no medication changes, follow up 3 months  ?01/15/21- Sarina Ill, MD (Neurology)- seen for chronic daily headaches, started emgality 120 mg/ml  every 30 days, started ubrelvy 100 mg every 2 hours prn max dose 200 mg/ day, CT performed, follow up 4 month ?  ?Hospital visits:  ?None in previous 6 months ? ? ?Objective: ? ?Lab Results  ?Component Value Date  ? CREATININE 1.03 (H) 12/03/2021  ? BUN 21 12/03/2021  ? GFR 71.82 07/22/2013  ? GFRNONAA >60 11/20/2021  ? GFRAA 70 01/01/2021  ? NA 139 12/03/2021  ? K 4.0 12/03/2021  ? CALCIUM 10.5 (H) 12/03/2021  ? CO2 29  12/03/2021  ? GLUCOSE 159 (H) 12/03/2021  ? ? ?Lab Results  ?Component Value Date/Time  ? HGBA1C 7.3 (H) 12/03/2021 11:05 AM  ? HGBA1C 6.1 10/25/2020 12:00 AM  ? GFR 71.82 07/22/2013 03:28 PM  ?  ?Last diabetic Eye exam:   ?Lab Results  ?Component Value Date/Time  ? HMDIABEYEEXA No Retinopathy 10/25/2020 12:00 AM  ?  ?Last diabetic Foot exam:  ?Lab Results  ?Component Value Date/Time  ? HMDIABFOOTEX Negative 10/25/2020 12:00 AM  ?  ? ?Lab Results  ?Component Value Date  ? CHOL 181 12/03/2021  ? HDL 46 12/03/2021  ? [place] 93 12/03/2021  ? TRIG 251 (H) 12/03/2021  ? CHOLHDL 3.9 12/03/2021  ? ? ? ?  Latest Ref Rng & Units 12/03/2021  ? 11:05 AM 11/20/2021  ? 11:00 AM 08/03/2021  ?  9:55 AM  ?Hepatic Function  ?Total Protein 6.0 - 8.5 g/dL 7.6   7.3   7.6    ?Albumin 3.8 - 4.9 g/dL 4.7   4.0   4.5    ?AST 0 - 40 IU/L 25   27   39    ?ALT 0 - 32 IU/L 28   30   66    ?Alk Phosphatase 44 - 121 IU/L 85   62   131    ?Total Bilirubin 0.0 - 1.2 mg/dL 0.6   0.9   1.0    ? ? ?Lab Results  ?Component Value Date/Time  ? TSH 2.880 07/17/2020 10:11 AM  ? ? ? ?  Latest Ref Rng & Units 12/03/2021  ? 11:05 AM 11/20/2021  ? 11:00 AM 08/21/2021  ? 12:00 AM  ?CBC  ?WBC 3.4 - 10.8 x10E3/uL 9.2   11.1   10.1       ?Hemoglobin 11.1 - 15.9 g/dL 18.0   16.8   17.7       ?Hematocrit 34.0 - 46.6 % 51.5   49.2   52       ?Platelets 150 - 450 x10E3/uL 261   228   236       ?  ? This result is from an external source.  ? ? ?No results found for: VD25OH ? ?Clinical ASCVD: No  ?The 10-year ASCVD risk score (Arnett DK, et al., 2019) is: 9% ?  Values used to calculate the score: ?    Age: 50 years ?    Sex: Female ?    Is Non-Hispanic African American: No ?    Diabetic: Yes ?    Tobacco smoker: No ?    Systolic Blood Pressure: 956 mmHg ?    Is BP treated: Yes ?    HDL Cholesterol: 46 mg/dL ?    Total Cholesterol: 181 mg/dL   ? ? ?  11/29/2021  ?  1:35 PM 08/03/2021  ?  9:08 AM 10/26/2020  ?  3:04 PM  ?Depression screen PHQ 2/9  ?Decreased Interest 0 0 0  ?Down, Depressed, Hopeless 0 0 0  ?PHQ - 2 Score 0 0 0  ?  ? ?Other: (CHADS2VASc if Afib, MMRC or CAT for COPD, ACT, DEXA) ? ?Social History  ? ?Tobacco Use  ?Smoking Status Former  ? Packs/day: 1.00  ? Years: 6.00  ? Pack  years: 6.00  ? Types: Cigarettes  ?Smokeless Tobacco Never  ?Tobacco Comments  ? Quit over 30 years ago  ? ?BP Readings from Last 3 Encounters:  ?01/09/22 130/80  ?12/03/21 120/80  ?11/29/21 122/78  ? ?Pulse Readings from Last  3 Encounters:  ?01/09/22 93  ?12/03/21 74  ?11/29/21 94  ? ?Wt Readings from Last 3 Encounters:  ?01/09/22 229 lb (103.9 kg)  ?12/03/21 230 lb (104.3 kg)  ?11/29/21 234 lb 9.6 oz (106.4 kg)  ? ?BMI Readings from Last 3 Encounters:  ?01/09/22 38.11 kg/m?  ?12/03/21 38.27 kg/m?  ?11/29/21 39.04 kg/m?  ? ? ?Assessment/Interventions: Review of patient past medical history, allergies, medications, health status, including review of consultants reports, laboratory and other test data, was performed as part of comprehensive evaluation and provision of chronic care management services.  ? ?SDOH:  (Social Determinants of Health) assessments and interventions performed: Yes ?SDOH Interventions   ? ?Flowsheet Row Most Recent Value  ?SDOH Interventions   ?Financial Strain Interventions Other (Comment)  ?Transportation Interventions Intervention Not Indicated  ? ?  ? ?SDOH Screenings  ? ?Alcohol Screen: Not on file  ?Depression (PHQ2-9): Low Risk   ? PHQ-2 Score: 0  ?Financial Resource Strain: High Risk  ? Difficulty of Paying Living Expenses: Hard  ?Food Insecurity: Not on file  ?Housing: Low Risk   ? Last Housing Risk Score: 0  ?Physical Activity: Not on file  ?Social Connections: Not on file  ?Stress: Not on file  ?Tobacco Use: Medium Risk  ? Smoking Tobacco Use: Former  ? Smokeless Tobacco Use: Never  ? Passive Exposure: Not on file  ?Transportation Needs: No Transportation Needs  ? Lack of Transportation (Medical): No  ? Lack of Transportation (Non-Medical): No  ? ? ?CCM Care Plan ? ?Allergies  ?Allergen Reactions  ? Tape Rash  ?  PREFERS [place]  ? Plavix [Clopidogrel Bisulfate] Other (See Comments)  ?  Joint pain  ? Tamoxifen Nausea And Vomiting  ? ? ?Medications Reviewed Today   ? ?  Reviewed by Lane Hacker, [facility] (Pharmacist) on 02/18/22 at 1132  Med List Status: <None>  ? ?Medication Order Taking? Sig Documenting Provider Last Dose Status Informant  ?aspirin 81 MG EC tablet [id number]  Take 8

## 2022-02-18 NOTE — Progress Notes (Signed)
?[facility]  ?[address] ?[place],  [place]  [address] ?[phone] ? ?Clinic Day:  02/19/2022 ? ?Referring physician: Lillard Anes,* ? ? ?HISTORY OF PRESENT ILLNESS:  ?The patient is a 61 y.o. 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  There was also a small component of lobular carcinoma in situ.  The plan was for her to take tamoxifen for 5 years of chemoprevention.  However, the patient claims this medicine caused severe nausea and vomiting to where she has been off of it for numerous months.  He is not opposed to taking something different for her chemoprevention, as long as is does not cause similar symptoms to her tamoxifen.  The patient denies having any particular changes in her breasts which concern her for disease recurrence. ? ?She is also being seen for polycythemia.  The plan was for her to have JAK2 mutation testing done at her initial visit to rule out polycythemia rubra vera.  However, her insurance did not approve for this test to be done at the time of her initial visit.  However, since then, JAK2 mutation testing has been approved.  As it pertains to her polycythemia, she denies having headaches, vision changes, or other hyperviscosity-related symptoms which concern her for complications related to her elevated red cell indices. ? ?PHYSICAL EXAM:  ?Blood pressure 140/65, pulse 72, temperature 98.5 ?F (36.9 ?C), resp. rate 16, height '5\' 5"'$  (1.651 m), weight 229 lb (103.9 kg), SpO2 95 %. ?Wt Readings from Last 3 Encounters:  ?02/19/22 229 lb (103.9 kg)  ?01/09/22 229 lb (103.9 kg)  ?12/03/21 230 lb (104.3 kg)  ? ?Body mass index is 38.11 kg/m?[name] ?Performance status (ECOG): 0 - Asymptomatic ?Physical Exam ?Constitutional:   ?   Appearance: Normal appearance.  ?HENT:  ?   Mouth/Throat:   ?   Pharynx: Oropharynx is clear. No oropharyngeal exudate.  ?Cardiovascular:  ?   Rate and Rhythm: Normal rate and regular rhythm.  ?   Heart sounds: No murmur heard. ?  No friction rub. No gallop.  ?Pulmonary:  ?   Breath sounds: Normal breath sounds.  ?Chest:  ?Breasts: ?   Right: No swelling, bleeding, inverted nipple, mass, nipple discharge or skin change.  ?   Left: No swelling, bleeding, inverted nipple, mass, nipple discharge or skin change.  ?Abdominal:  ?   General: Bowel sounds are normal. There is no distension.  ?   Palpations: Abdomen is soft. There is no mass.  ?   Tenderness: There is no abdominal tenderness.  ?Musculoskeletal:     ?   General: No tenderness.  ?   Cervical back: Normal range of motion and neck supple.  ?   Right lower leg: No edema.  ?   Left lower leg: No edema.  ?Lymphadenopathy:  ?   Cervical: No cervical adenopathy.  ?   Right cervical: No superficial, deep or posterior cervical adenopathy. ?   Left cervical: No superficial, deep or posterior cervical adenopathy.  ?   Upper Body:  ?   Right upper body: No supraclavicular or axillary adenopathy.  ?   Left upper body: No supraclavicular or axillary adenopathy.  ?   Lower Body: No right inguinal adenopathy. No left inguinal adenopathy.  ?Skin: ?   Coloration: Skin is not jaundiced.  ?  Findings: No lesion or rash.  ?Neurological:  ?   General: No focal deficit present.  ?   Mental Status: She is alert and oriented to person, place, and time. Mental status is at baseline.  ?Psychiatric:     ?   Mood and Affect: Mood normal.     ?   Behavior: Behavior normal.     ?   Thought Content: Thought content normal.     ?   Judgment: Judgment normal.  ? ? ?LABS:  ? ? ?  Latest Ref Rng & Units 02/19/2022  ? 12:00 AM 12/03/2021  ? 11:05 AM 11/20/2021  ? 11:00 AM  ?CBC  ?WBC  8.2      9.2   11.1    ?Hemoglobin 12.0 - 16.0 16.8      18.0   16.8    ?Hematocrit 36 - 46 50      51.5   49.2    ?Platelets 150 - 400 K/uL 248      261   228    ?  ? This  result is from an external source.  ? ? ?  Latest Ref Rng & Units 12/03/2021  ? 11:05 AM 11/20/2021  ? 11:00 AM 08/03/2021  ?  9:55 AM  ?CMP  ?Glucose 70 - 99 mg/dL 159   120   164    ?BUN 8 - 27 mg/dL '21   19   19    '$ ?Creatinine 0.57 - 1.00 mg/dL 1.03   0.99   1.03    ?Sodium 134 - 144 mmol/L 139   138   139    ?Potassium 3.5 - 5.2 mmol/L 4.0   3.4   4.0    ?Chloride 96 - 106 mmol/L 94   95   93    ?CO2 20 - 29 mmol/L 29   33   28    ?Calcium 8.7 - 10.3 mg/dL 10.5   10.1   10.1    ?Total Protein 6.0 - 8.5 g/dL 7.6   7.3   7.6    ?Total Bilirubin 0.0 - 1.2 mg/dL 0.6   0.9   1.0    ?Alkaline Phos 44 - 121 IU/L 85   62   131    ?AST 0 - 40 IU/L 25   27   39    ?ALT 0 - 32 IU/L 28   30   66    ? ? ?ASSESSMENT & PLAN:  ?A 61 y.o. 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  She also likely has secondary polycythemia.  Based upon her clinical breast exam today, the patient remains disease-free.  As she had problems tolerating tamoxifen, I will switch her to raloxifene to see if she will tolerate this agent better.  This will also potentially be given for 5 years of chemoprevention. With respect to her polycythemia, her hemoglobin is lower today than what it was previously.  This it is encouraging that she likely does not have polycythemia rubra vera.  However, I will have JAK2 mutation testing done today for verification.  Clinically, this patient is doing well.  I will see her back in 6 months for repeat clinical assessment.  Her annual mammogram will be scheduled before her next visit for continued radiographic breast cancer surveillance.  The patient understands all the plans discussed today and is in agreement with them. ? ?Chane Magner Macarthur Critchley, MD   ? ? ?  ? ?

## 2022-02-19 ENCOUNTER — Inpatient Hospital Stay (INDEPENDENT_AMBULATORY_CARE_PROVIDER_SITE_OTHER): Payer: HMO | Admitting: Oncology

## 2022-02-19 ENCOUNTER — Telehealth: Payer: Self-pay | Admitting: Oncology

## 2022-02-19 ENCOUNTER — Encounter: Payer: Self-pay | Admitting: Oncology

## 2022-02-19 ENCOUNTER — Encounter: Payer: Self-pay | Admitting: Legal Medicine

## 2022-02-19 ENCOUNTER — Other Ambulatory Visit: Payer: Self-pay | Admitting: Legal Medicine

## 2022-02-19 ENCOUNTER — Other Ambulatory Visit: Payer: Self-pay | Admitting: Oncology

## 2022-02-19 ENCOUNTER — Inpatient Hospital Stay: Payer: HMO | Attending: Oncology

## 2022-02-19 VITALS — BP 140/65 | HR 72 | Temp 98.5°F | Resp 16 | Ht 65.0 in | Wt 229.0 lb

## 2022-02-19 DIAGNOSIS — D0510 Intraductal carcinoma in situ of unspecified breast: Secondary | ICD-10-CM | POA: Diagnosis not present

## 2022-02-19 DIAGNOSIS — C50411 Malignant neoplasm of upper-outer quadrant of right female breast: Secondary | ICD-10-CM

## 2022-02-19 DIAGNOSIS — Z86000 Personal history of in-situ neoplasm of breast: Secondary | ICD-10-CM | POA: Diagnosis not present

## 2022-02-19 DIAGNOSIS — Z6841 Body Mass Index (BMI) 40.0 and over, adult: Secondary | ICD-10-CM

## 2022-02-19 DIAGNOSIS — D45 Polycythemia vera: Secondary | ICD-10-CM

## 2022-02-19 DIAGNOSIS — C50919 Malignant neoplasm of unspecified site of unspecified female breast: Secondary | ICD-10-CM | POA: Diagnosis not present

## 2022-02-19 LAB — CBC AND DIFFERENTIAL
HCT: 50 — AB (ref 36–46)
Hemoglobin: 16.8 — AB (ref 12.0–16.0)
Neutrophils Absolute: 5.49
Platelets: 248 10*3/uL (ref 150–400)
WBC: 8.2

## 2022-02-19 LAB — CBC: RBC: 5.48 — AB (ref 3.87–5.11)

## 2022-02-19 MED ORDER — RALOXIFENE HCL 60 MG PO TABS: 60.0000 mg | ORAL_TABLET | Freq: Every day | ORAL | 3 refills | Status: DC

## 2022-02-19 NOTE — Progress Notes (Unsigned)
Fax confirmation from [facility]: ? ? ?

## 2022-02-19 NOTE — Telephone Encounter (Signed)
Per 02/19/22 los next appt scheduled and confirmed with patient ?

## 2022-02-24 LAB — JAK2 GENOTYPR

## 2022-03-05 ENCOUNTER — Encounter: Payer: Self-pay | Admitting: Legal Medicine

## 2022-03-05 ENCOUNTER — Ambulatory Visit (INDEPENDENT_AMBULATORY_CARE_PROVIDER_SITE_OTHER): Payer: HMO | Admitting: Legal Medicine

## 2022-03-05 VITALS — BP 100/70 | HR 77 | Temp 97.9°F | Resp 15 | Ht 65.0 in | Wt 229.0 lb

## 2022-03-05 DIAGNOSIS — G894 Chronic pain syndrome: Secondary | ICD-10-CM | POA: Diagnosis not present

## 2022-03-05 DIAGNOSIS — Z6833 Body mass index (BMI) 33.0-33.9, adult: Secondary | ICD-10-CM | POA: Insufficient documentation

## 2022-03-05 DIAGNOSIS — T466X5A Adverse effect of antihyperlipidemic and antiarteriosclerotic drugs, initial encounter: Secondary | ICD-10-CM

## 2022-03-05 DIAGNOSIS — I5022 Chronic systolic (congestive) heart failure: Secondary | ICD-10-CM | POA: Diagnosis not present

## 2022-03-05 DIAGNOSIS — Z6838 Body mass index (BMI) 38.0-38.9, adult: Secondary | ICD-10-CM

## 2022-03-05 DIAGNOSIS — M791 Myalgia, unspecified site: Secondary | ICD-10-CM

## 2022-03-05 DIAGNOSIS — E782 Mixed hyperlipidemia: Secondary | ICD-10-CM | POA: Diagnosis not present

## 2022-03-05 DIAGNOSIS — I110 Hypertensive heart disease with heart failure: Secondary | ICD-10-CM | POA: Diagnosis not present

## 2022-03-05 DIAGNOSIS — I773 Arterial fibromuscular dysplasia: Secondary | ICD-10-CM | POA: Diagnosis not present

## 2022-03-05 DIAGNOSIS — Z171 Estrogen receptor negative status [ER-]: Secondary | ICD-10-CM | POA: Diagnosis not present

## 2022-03-05 DIAGNOSIS — C50611 Malignant neoplasm of axillary tail of right female breast: Secondary | ICD-10-CM

## 2022-03-05 DIAGNOSIS — Z6837 Body mass index (BMI) 37.0-37.9, adult: Secondary | ICD-10-CM | POA: Insufficient documentation

## 2022-03-05 MED ORDER — EZETIMIBE 10 MG PO TABS: 10.0000 mg | ORAL_TABLET | Freq: Every day | ORAL | 3 refills | Status: DC

## 2022-03-05 NOTE — Progress Notes (Signed)
? ?Subjective:  ?Patient ID: Allison White, female    DOB: January 19, 1961  Age: 61 y.o. MRN: [id number] ? ?Chief Complaint  ?Patient presents with  ? Hyperlipidemia  ? Fibromyalgia  ? Congestive Heart Failure  ? ? ?HPI: chronic visit ? ? CHF: Patient is taking torsemide 100 mg daily, Spironolactone 25 mg daily, Digoxin 0.125 mg daily, corlanor 7.5 mg twice a day. ?Patient presents with HFrEF  ( 25 - 30%)that is stable. Diagnosis made 2017.  The course of the disease is stable.  Current medicines include torsemide, spironolactone, digoxin, corlanor. Patient follows a low cholesterol diet and maintains a weight diary.  Patient is on low salt, low cholesterol diet and avoids alcohol.  Patient denies adverse effects of medicines. Patient is monitoring weight and has no change of weight.  Patient is having no pedal edema, no PND and no PND.  Patient is continuing to see cardiology. She has IED, pacemaker ? ?Hypertension: Taking Metoprolol 50 mg twice a day, Aspirin 81 mg daily. ?Patient presents for follow up of hypertension.  Patient tolerating metoprolol well with side effects.  Patient was diagnosed with hypertension 2010 so has been treated for hypertension for 12 years.Patient is working on maintaining diet and exercise regimen and follows up as directed. Complication include CHF.  ? ?Breast cancer: Evista 60 mg daily, off tamoxephen ?Current Outpatient Medications on File Prior to Visit  ?Medication Sig Dispense Refill  ? aspirin 81 MG EC tablet Take 81 mg by mouth daily.     ? dapagliflozin propanediol (FARXIGA) 10 MG TABS tablet Take 1 tablet (10 mg total) by mouth daily. 90 tablet 3  ? digoxin (LANOXIN) 0.125 MG tablet Take 1 tablet by mouth once daily 90 tablet 3  ? HYDROcodone-acetaminophen (NORCO) 7.5-325 MG tablet Take 1 tablet by mouth every 6 (six) hours as needed for moderate pain. 90 tablet 0  ? ivabradine (CORLANOR) 7.5 MG TABS tablet Take 1 tablet (7.5 mg total) by mouth 2 (two) times daily with a meal. 180  tablet 3  ? LORATADINE PO Take by mouth daily. [facility] brand    ? metoprolol succinate (TOPROL-XL) 50 MG 24 hr tablet Take 1 tablet by mouth twice daily 180 tablet 0  ? nitroGLYCERIN (NITROSTAT) 0.4 MG SL tablet Place 0.4 mg under the tongue every 5 (five) minutes as needed for chest pain.    ? raloxifene (EVISTA) 60 MG tablet Take 1 tablet (60 mg total) by mouth daily. 90 tablet 3  ? spironolactone (ALDACTONE) 25 MG tablet Take 1 tablet by mouth once daily 90 tablet 2  ? torsemide (DEMADEX) 100 MG tablet Take 1/2 (one-half) tablet by mouth once daily 45 tablet 3  ? ?No current facility-administered medications on file prior to visit.  ? ?Past Medical History:  ?Diagnosis Date  ? Cancer [facility]) 2022  ? Rt Breast  ? CHF (congestive heart failure) ([place])   ? FMD (facioscapulohumeral muscular dystrophy) ([place])   ? Headache(784.0)   ? Heart failure ([place])   ? Hyperlipidemia   ? MI (myocardial infarction) ([place])   ? Migraine   ? Nonruptured cerebral aneurysm, internal carotid artery   ? Left side, stent placement (2009)  ? Pseudoaneurysm ([place])   ? both carotids   ? Vertigo   ? ?Past Surgical History:  ?Procedure Laterality Date  ? ABDOMINAL HYSTERECTOMY    ? BREAST LUMPECTOMY Right 07/2021  ? CARDIAC DEFIBRILLATOR PLACEMENT    ? CEREBRAL ANEURYSM REPAIR Left 2009  ? CESAREAN SECTION    ?  CORONARY ANGIOPLASTY WITH STENT PLACEMENT    ? PACEMAKER IMPLANT    ? RIGHT/LEFT HEART CATH AND CORONARY ANGIOGRAPHY N/A 04/07/2020  ? Procedure: RIGHT/LEFT HEART CATH AND CORONARY ANGIOGRAPHY;  Surgeon: Jolaine Artist, MD;  Location: [place] [facility];  Service: Cardiovascular;  Laterality: N/A;  ? TUBAL LIGATION    ?  ?Family History  ?Problem Relation Age of Onset  ? Thyroid disease Mother   ? Prostate cancer Father   ?     Prostate  ? Cerebral aneurysm Paternal Grandmother   ?     Nonruptured  ? Dementia Paternal Grandfather   ? Prostate cancer Paternal Grandfather   ? Hypertension Brother   ? Breast cancer Neg Hx   ? ?Social  History  ? ?Socioeconomic History  ? Marital status: Married  ?  Spouse name: Octavia Bruckner  ? Number of children: 4  ? Years of education: Not on file  ? Highest education level: High school graduate  ?Occupational History  ? Not on file  ?Tobacco Use  ? Smoking status: Former  ?  Packs/day: 1.00  ?  Years: 6.00  ?  Pack years: 6.00  ?  Types: Cigarettes  ? Smokeless tobacco: Never  ? Tobacco comments:  ?  Quit over 30 years ago  ?Vaping Use  ? Vaping Use: Never used  ?Substance and Sexual Activity  ? Alcohol use: Yes  ?  Comment: Occasionally "little"  ? Drug use: No  ? Sexual activity: Not Currently  ?Other Topics Concern  ? Not on file  ?Social History Narrative  ? 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    ? Right handed  ? Caffeine: "very little"  ? ?Social Determinants of Health  ? ?Financial Resource Strain: High Risk  ? Difficulty of Paying Living Expenses: Hard  ?Food Insecurity: Not on file  ?Transportation Needs: No Transportation Needs  ? Lack of Transportation (Medical): No  ? Lack of Transportation (Non-Medical): No  ?Physical Activity: Not on file  ?Stress: Not on file  ?Social Connections: Not on file  ? ? ?Review of Systems  ?Constitutional:  Negative for chills, fatigue and fever.  ?HENT:  Negative for congestion, ear pain and sore throat.   ?Eyes:  Negative for visual disturbance.  ?Respiratory:  Positive for shortness of breath. Negative for cough.   ?Cardiovascular:  Negative for chest pain and palpitations.  ?Gastrointestinal:  Negative for abdominal pain, constipation, diarrhea, nausea and vomiting.  ?Endocrine: Negative for polydipsia, polyphagia and polyuria.  ?Genitourinary:  Negative for difficulty urinating and dysuria.  ?Musculoskeletal:  Negative for arthralgias, back pain and myalgias.  ?Skin:  Negative for rash.  ?Neurological:  Negative for headaches.  ?Psychiatric/Behavioral:  Negative for dysphoric mood. The patient is not nervous/anxious.   ? ? ?Objective:  ?BP 100/70   Pulse 77   Temp  97.9 ?F (36.6 ?C)   Resp 15   Ht '5\' 5"'$  (1.651 m)   Wt 229 lb (103.9 kg)   SpO2 98%   BMI 38.11 kg/m?  ? ? ?  03/05/2022  ?  9:45 AM 02/19/2022  ?  2:12 PM 01/09/2022  ?  1:53 PM  ?BP/Weight  ?Systolic BP [phone]  ?Diastolic BP 70 65 80  ?Wt. (Lbs) 229 229 229  ?BMI 38.11 kg/m2 38.11 kg/m2 38.11 kg/m2  ? ? ?Physical Exam ?Vitals reviewed.  ?Constitutional:   ?   General: She is not in acute distress. ?   Appearance: Normal appearance.  ?HENT:  ?  Head: Normocephalic.  ?   Right Ear: Tympanic membrane normal.  ?   Left Ear: Tympanic membrane normal.  ?   Nose: Nose normal.  ?   Mouth/Throat:  ?   Mouth: Mucous membranes are moist.  ?Eyes:  ?   Conjunctiva/sclera: Conjunctivae normal.  ?   Pupils: Pupils are equal, round, and reactive to light.  ?Cardiovascular:  ?   Rate and Rhythm: Normal rate and regular rhythm.  ?   Pulses: Normal pulses.  ?   Heart sounds: Normal heart sounds. No murmur heard. ?  No gallop.  ?Pulmonary:  ?   Effort: Pulmonary effort is normal. No respiratory distress.  ?   Breath sounds: Normal breath sounds. No wheezing.  ?Abdominal:  ?   General: Abdomen is flat. Bowel sounds are normal. There is no distension.  ?   Tenderness: There is no abdominal tenderness.  ?Musculoskeletal:  ?   Cervical back: Normal range of motion.  ?   Right lower leg: No edema.  ?   Left lower leg: No edema.  ?Skin: ?   General: Skin is warm.  ?   Capillary Refill: Capillary refill takes less than 2 seconds.  ?Neurological:  ?   General: No focal deficit present.  ?   Mental Status: She is alert and oriented to person, place, and time. Mental status is at baseline.  ?   Gait: Gait normal.  ?   Deep Tendon Reflexes: Reflexes normal.  ? ? ? ?  ? ?Lab Results  ?Component Value Date  ? WBC 8.2 02/19/2022  ? HGB 16.8 (A) 02/19/2022  ? HCT 50 (A) 02/19/2022  ? PLT 248 02/19/2022  ? GLUCOSE 159 (H) 12/03/2021  ? CHOL 181 12/03/2021  ? TRIG 251 (H) 12/03/2021  ? HDL 46 12/03/2021  ? [place] 93 12/03/2021  ? ALT 28  12/03/2021  ? AST 25 12/03/2021  ? NA 139 12/03/2021  ? K 4.0 12/03/2021  ? CL 94 (L) 12/03/2021  ? CREATININE 1.03 (H) 12/03/2021  ? BUN 21 12/03/2021  ? CO2 29 12/03/2021  ? TSH 2.880 07/17/2020  ? INR 1.0 04/04/2020

## 2022-03-06 LAB — CBC WITH DIFFERENTIAL/PLATELET
Basophils Absolute: 0 10*3/uL (ref 0.0–0.2)
Basos: 1 %
EOS (ABSOLUTE): 0.2 10*3/uL (ref 0.0–0.4)
Eos: 3 %
Hematocrit: 50.8 % — ABNORMAL HIGH (ref 34.0–46.6)
Hemoglobin: 17.7 g/dL — ABNORMAL HIGH (ref 11.1–15.9)
Immature Grans (Abs): 0 10*3/uL (ref 0.0–0.1)
Immature Granulocytes: 0 %
Lymphocytes Absolute: 1.6 10*3/uL (ref 0.7–3.1)
Lymphs: 20 %
MCH: 31.3 pg (ref 26.6–33.0)
MCHC: 34.8 g/dL (ref 31.5–35.7)
MCV: 90 fL (ref 79–97)
Monocytes Absolute: 0.7 10*3/uL (ref 0.1–0.9)
Monocytes: 8 %
Neutrophils Absolute: 5.5 10*3/uL (ref 1.4–7.0)
Neutrophils: 68 %
Platelets: 289 10*3/uL (ref 150–450)
RBC: 5.66 x10E6/uL — ABNORMAL HIGH (ref 3.77–5.28)
RDW: 12.4 % (ref 11.7–15.4)
WBC: 8 10*3/uL (ref 3.4–10.8)

## 2022-03-06 LAB — COMPREHENSIVE METABOLIC PANEL
ALT: 19 IU/L (ref 0–32)
AST: 21 IU/L (ref 0–40)
Albumin/Globulin Ratio: 1.8 (ref 1.2–2.2)
Albumin: 4.9 g/dL (ref 3.8–4.9)
Alkaline Phosphatase: 92 IU/L (ref 44–121)
BUN/Creatinine Ratio: 20 (ref 12–28)
BUN: 20 mg/dL (ref 8–27)
Bilirubin Total: 0.6 mg/dL (ref 0.0–1.2)
CO2: 27 mmol/L (ref 20–29)
Calcium: 10.8 mg/dL — ABNORMAL HIGH (ref 8.7–10.3)
Chloride: 98 mmol/L (ref 96–106)
Creatinine, Ser: 0.99 mg/dL (ref 0.57–1.00)
Globulin, Total: 2.8 g/dL (ref 1.5–4.5)
Glucose: 131 mg/dL — ABNORMAL HIGH (ref 70–99)
Potassium: 4.4 mmol/L (ref 3.5–5.2)
Sodium: 142 mmol/L (ref 134–144)
Total Protein: 7.7 g/dL (ref 6.0–8.5)
eGFR: 65 mL/min/{1.73_m2} (ref >59)

## 2022-03-06 LAB — LIPID PANEL
Chol/HDL Ratio: 3.6 ratio (ref 0.0–4.4)
Cholesterol, Total: 187 mg/dL (ref 100–199)
HDL: 52 mg/dL (ref >39)
LDL Chol Calc (NIH): 91 mg/dL (ref 0–99)
Triglycerides: 267 mg/dL — ABNORMAL HIGH (ref 0–149)
VLDL Cholesterol Cal: 44 mg/dL — ABNORMAL HIGH (ref 5–40)

## 2022-03-06 LAB — CARDIOVASCULAR RISK ASSESSMENT

## 2022-03-06 NOTE — Progress Notes (Signed)
Glucose 131, kidney tests normal, calcium high, get PTH level, liver tests normal, triglycerides high 267 watch diet, hemoglobin high recommend hematology consult if not done ?lp

## 2022-03-14 ENCOUNTER — Ambulatory Visit (INDEPENDENT_AMBULATORY_CARE_PROVIDER_SITE_OTHER): Payer: HMO

## 2022-03-14 DIAGNOSIS — I420 Dilated cardiomyopathy: Secondary | ICD-10-CM

## 2022-03-14 DIAGNOSIS — I5022 Chronic systolic (congestive) heart failure: Secondary | ICD-10-CM

## 2022-03-14 LAB — CUP PACEART REMOTE DEVICE CHECK
Battery Remaining Percentage: 62 %
Battery Voltage: 3.1 V
Brady Statistic RA Percent Paced: 76 %
Brady Statistic RV Percent Paced: 1 %
Date Time Interrogation Session: 20230425091153
HighPow Impedance: 104 Ohm
Implantable Lead Implant Date: 20180410
Implantable Lead Implant Date: 20180410
Implantable Lead Location: 753859
Implantable Lead Location: 753860
Implantable Lead Model: 377
Implantable Lead Model: 402266
Implantable Lead Serial Number: 49794726
Implantable Lead Serial Number: 49838890
Implantable Pulse Generator Implant Date: 20180410
Lead Channel Impedance Value: 507 Ohm
Lead Channel Impedance Value: 580 Ohm
Lead Channel Pacing Threshold Amplitude: 0.6 V
Lead Channel Pacing Threshold Amplitude: 0.7 V
Lead Channel Pacing Threshold Pulse Width: 0.4 ms
Lead Channel Pacing Threshold Pulse Width: 0.4 ms
Lead Channel Sensing Intrinsic Amplitude: 1 mV
Lead Channel Sensing Intrinsic Amplitude: 2.7 mV
Lead Channel Setting Pacing Amplitude: 2 V
Lead Channel Setting Pacing Amplitude: 2.5 V
Lead Channel Setting Pacing Pulse Width: 0.4 ms
Lead Channel Setting Sensing Sensitivity: 0.8 mV
Pulse Gen Model: 404622
Pulse Gen Serial Number: 60982098

## 2022-03-17 DIAGNOSIS — E782 Mixed hyperlipidemia: Secondary | ICD-10-CM | POA: Diagnosis not present

## 2022-03-17 DIAGNOSIS — I110 Hypertensive heart disease with heart failure: Secondary | ICD-10-CM | POA: Diagnosis not present

## 2022-03-29 NOTE — Progress Notes (Signed)
Remote ICD transmission.   

## 2022-04-01 ENCOUNTER — Other Ambulatory Visit: Payer: Self-pay | Admitting: Legal Medicine

## 2022-04-01 ENCOUNTER — Telehealth (HOSPITAL_COMMUNITY): Payer: Self-pay | Admitting: Vascular Surgery

## 2022-04-01 DIAGNOSIS — C50919 Malignant neoplasm of unspecified site of unspecified female breast: Secondary | ICD-10-CM | POA: Diagnosis not present

## 2022-04-01 DIAGNOSIS — I720 Aneurysm of carotid artery: Secondary | ICD-10-CM | POA: Diagnosis not present

## 2022-04-01 DIAGNOSIS — G43909 Migraine, unspecified, not intractable, without status migrainosus: Secondary | ICD-10-CM | POA: Diagnosis not present

## 2022-04-01 DIAGNOSIS — D6869 Other thrombophilia: Secondary | ICD-10-CM | POA: Diagnosis not present

## 2022-04-01 DIAGNOSIS — I4891 Unspecified atrial fibrillation: Secondary | ICD-10-CM | POA: Diagnosis not present

## 2022-04-01 DIAGNOSIS — G894 Chronic pain syndrome: Secondary | ICD-10-CM

## 2022-04-01 DIAGNOSIS — I509 Heart failure, unspecified: Secondary | ICD-10-CM | POA: Diagnosis not present

## 2022-04-01 DIAGNOSIS — Z6841 Body Mass Index (BMI) 40.0 and over, adult: Secondary | ICD-10-CM | POA: Diagnosis not present

## 2022-04-01 DIAGNOSIS — I773 Arterial fibromuscular dysplasia: Secondary | ICD-10-CM | POA: Diagnosis not present

## 2022-04-01 DIAGNOSIS — I5022 Chronic systolic (congestive) heart failure: Secondary | ICD-10-CM | POA: Diagnosis not present

## 2022-04-01 DIAGNOSIS — E261 Secondary hyperaldosteronism: Secondary | ICD-10-CM | POA: Diagnosis not present

## 2022-04-01 DIAGNOSIS — E785 Hyperlipidemia, unspecified: Secondary | ICD-10-CM | POA: Diagnosis not present

## 2022-04-01 NOTE — Telephone Encounter (Signed)
Returned pt call , no VM  ?

## 2022-04-02 MED ORDER — HYDROCODONE-ACETAMINOPHEN 7.5-325 MG PO TABS: 1.0000 | ORAL_TABLET | Freq: Four times a day (QID) | ORAL | 0 refills | Status: DC | PRN

## 2022-04-24 ENCOUNTER — Other Ambulatory Visit (HOSPITAL_COMMUNITY): Payer: Self-pay | Admitting: Internal Medicine

## 2022-05-06 ENCOUNTER — Telehealth: Payer: Self-pay

## 2022-05-06 NOTE — Progress Notes (Cosign Needed)
Chronic Care Management Pharmacy Assistant   Name: Allison White  MRN: [id number] DOB: 1961-11-03   Reason for Encounter: General Adherence Call   Recent office visits:  03/05/22 Reinaldo Meeker MD. Seen for CHF. Started on Ezetimibe '10mg'$  daily. D/C Fluconazole '150mg'$  and Rosuvastatin Calcium '20mg'$ .   Recent consult visits:  02/19/22 (Oncology) Lavera Guise MD. Seen for follow up. Started on Raloxifene HCI '60mg'$  daily.   Hospital visits:  None  Medications: Outpatient Encounter Medications as of 05/06/2022  Medication Sig   aspirin 81 MG EC tablet Take 81 mg by mouth daily.    dapagliflozin propanediol (FARXIGA) 10 MG TABS tablet Take 1 tablet (10 mg total) by mouth daily.   digoxin (LANOXIN) 0.125 MG tablet Take 1 tablet by mouth once daily   ezetimibe (ZETIA) 10 MG tablet Take 1 tablet (10 mg total) by mouth daily.   HYDROcodone-acetaminophen (NORCO) 7.5-325 MG tablet Take 1 tablet by mouth every 6 (six) hours as needed for moderate pain.   ivabradine (CORLANOR) 7.5 MG TABS tablet Take 1 tablet (7.5 mg total) by mouth 2 (two) times daily with a meal.   LORATADINE PO Take by mouth daily. [facility] brand   metoprolol succinate (TOPROL-XL) 50 MG 24 hr tablet Take 1 tablet by mouth twice daily   nitroGLYCERIN (NITROSTAT) 0.4 MG SL tablet Place 0.4 mg under the tongue every 5 (five) minutes as needed for chest pain.   raloxifene (EVISTA) 60 MG tablet Take 1 tablet (60 mg total) by mouth daily.   spironolactone (ALDACTONE) 25 MG tablet Take 1 tablet by mouth once daily   torsemide (DEMADEX) 100 MG tablet Take 1/2 (one-half) tablet by mouth once daily   No facility-administered encounter medications on file as of 05/06/2022.    [place] for General Review Call   Chart Review:  Have there been any documented new, changed, or discontinued medications since last visit? Yes Has there been any documented recent hospitalizations or ED visits since last visit with Clinical  Pharmacist? No Brief Summary (including medication and/or Diagnosis changes):   Adherence Review:  Does the Clinical Pharmacist Assistant have access to adherence rates? Yes Adherence rates for STAR metric medications (List medication(s)/day supply/ last 2 fill dates). Dapagliflozin '10mg'$  (PAP) Adherence rates for medications indicated for disease state being reviewed (List medication(s)/day supply/ last 2 fill dates). Does the patient have >5 day gap between last estimated fill dates for any of the above medications or other medication gaps? No Reason for medication gaps.   Disease State Questions:  Able to connect with Patient? Yes Did patient have any problems with their health recently? No Note problems and Concerns: Have you had any admissions or emergency room visits or worsening of your condition(s) since last visit? No Details of ED visit, hospital visit and/or worsening condition(s): Have you had any visits with new specialists or providers since your last visit? No Explain: Have you had any new health care problem(s) since your last visit? No New problem(s) reported: Have you run out of any of your medications since you last spoke with clinical pharmacist? No What caused you to run out of your medications? Are there any medications you are not taking as prescribed? No What kept you from taking your medications as prescribed? Are you having any issues or side effects with your medications? No Note of issues or side effects: Do you have any other health concerns or questions you want to discuss with your Clinical Pharmacist before your next visit?  No Note additional concerns and questions from Patient. Are there any health concerns that you feel we can do a better job addressing? No Note Patient's response. Are you having any problems with any of the following since the last visit: (select all that apply)  None  Details: 12. Any falls since last visit? No  Details: 13.  Any increased or uncontrolled pain since last visit? No  Details: 14. Next visit Type: office       Visit with:Camnitz, Ocie Doyne, MD        Date:07/08/22        Time:10:30am  15. Additional Details? No   Elray Mcgregor, [facility] Catering manager  [phone]

## 2022-05-28 ENCOUNTER — Other Ambulatory Visit: Payer: Self-pay | Admitting: Legal Medicine

## 2022-05-28 DIAGNOSIS — G894 Chronic pain syndrome: Secondary | ICD-10-CM

## 2022-05-28 DIAGNOSIS — I773 Arterial fibromuscular dysplasia: Secondary | ICD-10-CM

## 2022-05-28 MED ORDER — HYDROCODONE-ACETAMINOPHEN 7.5-325 MG PO TABS: 1.0000 | ORAL_TABLET | Freq: Four times a day (QID) | ORAL | 0 refills | Status: DC | PRN

## 2022-06-03 DIAGNOSIS — D0511 Intraductal carcinoma in situ of right breast: Secondary | ICD-10-CM | POA: Diagnosis not present

## 2022-06-03 DIAGNOSIS — R928 Other abnormal and inconclusive findings on diagnostic imaging of breast: Secondary | ICD-10-CM | POA: Diagnosis not present

## 2022-06-11 DIAGNOSIS — D0511 Intraductal carcinoma in situ of right breast: Secondary | ICD-10-CM | POA: Diagnosis not present

## 2022-06-13 ENCOUNTER — Ambulatory Visit (INDEPENDENT_AMBULATORY_CARE_PROVIDER_SITE_OTHER): Payer: PPO

## 2022-06-13 DIAGNOSIS — I420 Dilated cardiomyopathy: Secondary | ICD-10-CM | POA: Diagnosis not present

## 2022-06-13 LAB — CUP PACEART REMOTE DEVICE CHECK
Battery Remaining Percentage: 59 %
Battery Voltage: 3.1 V
Brady Statistic RA Percent Paced: 76 %
Brady Statistic RV Percent Paced: 1 %
Date Time Interrogation Session: 20230726134822
HighPow Impedance: 104 Ohm
Implantable Lead Implant Date: 20180410
Implantable Lead Implant Date: 20180410
Implantable Lead Location: 753859
Implantable Lead Location: 753860
Implantable Lead Model: 377
Implantable Lead Model: 402266
Implantable Lead Serial Number: 49794726
Implantable Lead Serial Number: 49838890
Implantable Pulse Generator Implant Date: 20180410
Lead Channel Impedance Value: 536 Ohm
Lead Channel Impedance Value: 609 Ohm
Lead Channel Pacing Threshold Amplitude: 0.7 V
Lead Channel Pacing Threshold Amplitude: 0.7 V
Lead Channel Pacing Threshold Pulse Width: 0.4 ms
Lead Channel Pacing Threshold Pulse Width: 0.4 ms
Lead Channel Sensing Intrinsic Amplitude: 17.2 mV
Lead Channel Setting Pacing Amplitude: 2 V
Lead Channel Setting Pacing Amplitude: 2.5 V
Lead Channel Setting Pacing Pulse Width: 0.4 ms
Lead Channel Setting Sensing Sensitivity: 0.8 mV
Pulse Gen Model: 404622
Pulse Gen Serial Number: 60982098

## 2022-06-14 ENCOUNTER — Telehealth: Payer: Self-pay

## 2022-06-14 NOTE — Progress Notes (Signed)
Chronic Care Management Pharmacy Assistant   Name: Allison White  MRN: [id number] DOB: [birth year]   Reason for Encounter: Disease State call for HTN   Recent office visits:  None  Recent consult visits:  None  Hospital visits:  None  Medications: Outpatient Encounter Medications as of 06/14/2022  Medication Sig   aspirin 81 MG EC tablet Take 81 mg by mouth daily.    dapagliflozin propanediol (FARXIGA) 10 MG TABS tablet Take 1 tablet (10 mg total) by mouth daily.   digoxin (LANOXIN) 0.125 MG tablet Take 1 tablet by mouth once daily   ezetimibe (ZETIA) 10 MG tablet Take 1 tablet (10 mg total) by mouth daily.   HYDROcodone-acetaminophen (NORCO) 7.5-325 MG tablet Take 1 tablet by mouth every 6 (six) hours as needed for moderate pain.   ivabradine (CORLANOR) 7.5 MG TABS tablet Take 1 tablet (7.5 mg total) by mouth 2 (two) times daily with a meal.   LORATADINE PO Take by mouth daily. [facility] brand   metoprolol succinate (TOPROL-XL) 50 MG 24 hr tablet Take 1 tablet by mouth twice daily   nitroGLYCERIN (NITROSTAT) 0.4 MG SL tablet Place 0.4 mg under the tongue every 5 (five) minutes as needed for chest pain.   raloxifene (EVISTA) 60 MG tablet Take 1 tablet (60 mg total) by mouth daily.   spironolactone (ALDACTONE) 25 MG tablet Take 1 tablet by mouth once daily   torsemide (DEMADEX) 100 MG tablet Take 1/2 (one-half) tablet by mouth once daily   No facility-administered encounter medications on file as of 06/14/2022.     Recent Office Vitals: BP Readings from Last 3 Encounters:  03/05/22 100/70  02/19/22 140/65  01/09/22 130/80   Pulse Readings from Last 3 Encounters:  03/05/22 77  02/19/22 72  01/09/22 93    Wt Readings from Last 3 Encounters:  03/05/22 229 lb (103.9 kg)  02/19/22 229 lb (103.9 kg)  01/09/22 229 lb (103.9 kg)     Kidney Function Lab Results  Component Value Date/Time   CREATININE 0.99 03/05/2022 10:40 AM   CREATININE 1.03 (H) 12/03/2021 11:05 AM    GFR 71.82 07/22/2013 03:28 PM   GFRNONAA >60 11/20/2021 11:00 AM   GFRAA 70 01/01/2021 10:39 AM       Latest Ref Rng & Units 03/05/2022   10:40 AM 12/03/2021   11:05 AM 11/20/2021   11:00 AM  BMP  Glucose 70 - 99 mg/dL 131  159  120   BUN 8 - 27 mg/dL '20  21  19   '$ Creatinine 0.57 - 1.00 mg/dL 0.99  1.03  0.99   BUN/Creat Ratio 12 - '28 20  20    '$ Sodium 134 - 144 mmol/L 142  139  138   Potassium 3.5 - 5.2 mmol/L 4.4  4.0  3.4   Chloride 96 - 106 mmol/L 98  94  95   CO2 20 - 29 mmol/L 27  29  33   Calcium 8.7 - 10.3 mg/dL 10.8  10.5  10.1      Current antihypertensive regimen:  Metoprolol Succinate '50mg'$  twice daily  Spironolactone '25mg'$  daily  Torsemide '100mg'$  -1/2 tablet daily  Digoxin 0.'125mg'$  daily  Patient verbally confirms she is taking the above medications as directed. Yes  How often are you checking your Blood Pressure? weekly  Current home BP readings: 120/70  Wrist or arm cuff: Wrist  Caffeine intake:1 cup of coffee and a soda every now and then  Salt intake:Limited  OTC medications including  pseudoephedrine or NSAIDs? Tylenol and Ibuprofen as needed   Any readings above 180/120? No  What recent interventions/DTPs have been made by any provider to improve Blood Pressure control since last CPP Visit: No changes   Any recent hospitalizations or ED visits since last visit with CPP? No  What diet changes have been made to improve Blood Pressure Control?  Watching salt intake   What exercise is being done to improve your Blood Pressure Control?  Pt does a lot of yard work, and goes swimming   Adherence Review: Is the patient currently on ACE/ARB medication? No Does the patient have >5 day gap between last estimated fill dates? CPP to review  Care Gaps: Last annual wellness visit?11/29/21  Star Rating Drugs:  Medication:  Last Fill: Day Supply  None noted   Elray Mcgregor, [place] Pharmacist Assistant  [phone]

## 2022-06-24 ENCOUNTER — Telehealth: Payer: Self-pay

## 2022-06-24 NOTE — Telephone Encounter (Addendum)
06/28/22 - I called pt and told her that Dr Bobby Rumpf recommended she do some exercises with lower legs. She asked was there anything else she could try?  06/24/22 - Pt here with another pt for visit with Dr Hinton Rao. Shawnell would like Dr Bobby Rumpf to know she has been having bilateral leg cramps from her knees to ankles since April 2023, when she started the Evista. She has tried drinking pickle juice, & applied magnesium cream to her legs (which helped for a while). I notified Melissa,NP, of pt's concerns. Melissa reviewed the drug side effects, which do include cramps, & muscle pain. She ask that I tell pt that since she couldn't take the Tamoxifen, that Dr Bobby Rumpf would need to be the one who decided the best course of action. Dr Bobby Rumpf returns to work on 06/27/2022  I told pt that I would talked with Dr Bobby Rumpf regarding her concern and call her back with an update. [phone]

## 2022-07-05 NOTE — Progress Notes (Signed)
Remote ICD transmission.   

## 2022-07-08 ENCOUNTER — Encounter: Payer: Self-pay | Admitting: Cardiology

## 2022-07-08 ENCOUNTER — Ambulatory Visit (INDEPENDENT_AMBULATORY_CARE_PROVIDER_SITE_OTHER): Payer: HMO | Admitting: Cardiology

## 2022-07-08 VITALS — BP 132/82 | HR 80 | Ht 65.0 in | Wt 226.0 lb

## 2022-07-08 DIAGNOSIS — I5022 Chronic systolic (congestive) heart failure: Secondary | ICD-10-CM

## 2022-07-08 NOTE — Progress Notes (Signed)
Electrophysiology Office Note   Date:  07/08/2022   ID:  Allison White, DOB Mar 20, 1961, MRN [id number]  PCP:  Allison Anes, MD  Cardiologist:  Allison White Primary Electrophysiologist:  Allison Utsey Meredith Leeds, MD    No chief complaint on file.     History of Present Illness: Allison White is a 61 y.o. female who is being seen today for the evaluation of systolic heart failure at the request of Allison White,*. Presenting today for electrophysiology evaluation.    She has a history significant for chronic systolic heart failure, coronary artery disease, hyperlipidemia.  She is post Biotronik ICD.  She had anterior STEMI 11/19/2016 that is post drug-eluting stent to the LAD and diagonal bifurcation.  Her ICD was implanted around that time.  Today, denies symptoms of palpitations, chest pain, shortness of breath, orthopnea, PND, lower extremity edema, claudication, dizziness, presyncope, syncope, bleeding, or neurologic sequela. The patient is tolerating medications without difficulties.  Currently she is feeling well.  She has no chest pain or shortness of breath and is able to do all her daily activities.  This past year she was diagnosed with breast cancer.  She had lumpectomy x2 and radiation of her left breast.  Her last therapy was in December.  Her most recent mammogram showed no recurrence.   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    Vertigo    Past Surgical History:  Procedure Laterality Date   ABDOMINAL HYSTERECTOMY     BREAST LUMPECTOMY Right 07/2021   CARDIAC DEFIBRILLATOR PLACEMENT      CEREBRAL ANEURYSM REPAIR Left 2009   CESAREAN SECTION     CORONARY ANGIOPLASTY WITH STENT PLACEMENT     PACEMAKER IMPLANT     RIGHT/LEFT HEART CATH AND CORONARY ANGIOGRAPHY N/A 04/07/2020   Procedure: RIGHT/LEFT HEART CATH AND CORONARY ANGIOGRAPHY;  Surgeon: Jolaine Artist, MD;  Location: [facility];  Service: Cardiovascular;  Laterality: N/A;   TUBAL LIGATION       Current Outpatient Medications  Medication Sig Dispense Refill   aspirin 81 MG EC tablet Take 81 mg by mouth daily.      AZO-CRANBERRY PO Take 1 capsule by mouth daily.     dapagliflozin propanediol (FARXIGA) 10 MG TABS tablet Take 1 tablet (10 mg total) by mouth daily. 90 tablet 3   digoxin (LANOXIN) 0.125 MG tablet Take 1 tablet by mouth once daily 90 tablet 3   ezetimibe (ZETIA) 10 MG tablet Take 1 tablet (10 mg total) by mouth daily. 90 tablet 3   HYDROcodone-acetaminophen (NORCO) 7.5-325 MG tablet Take 1 tablet by mouth every 6 (six) hours as needed for moderate pain. 90 tablet 0   ivabradine (CORLANOR) 7.5 MG TABS tablet Take 1 tablet (7.5 mg total) by mouth 2 (two) times daily with a meal. 180 tablet 3   LORATADINE PO Take by mouth daily. [facility] brand     metoprolol succinate (TOPROL-XL) 50 MG 24 hr tablet Take 1 tablet by mouth twice daily 180 tablet 3   nitroGLYCERIN (NITROSTAT) 0.4 MG SL tablet Place  0.4 mg under the tongue every 5 (five) minutes as needed for chest pain.     raloxifene (EVISTA) 60 MG tablet Take 1 tablet (60 mg total) by mouth daily. 90 tablet 3   spironolactone (ALDACTONE) 25 MG tablet Take 1 tablet by mouth once daily 90 tablet 2   torsemide (DEMADEX) 100 MG tablet Take 1/2 (one-half) tablet by mouth once daily 45 tablet 3   No current facility-administered medications for this visit.    Allergies:   Tape, Plavix [clopidogrel bisulfate], and Tamoxifen   Social History:  The patient  reports that she has quit smoking. Her smoking use included cigarettes. She has a 6.00 pack-year  smoking history. She has never used smokeless tobacco. She reports current alcohol use. She reports that she does not use drugs.   Family History:  The patient's family history includes Cerebral aneurysm in her paternal grandmother; Dementia in her paternal grandfather; Hypertension in her brother; Prostate cancer in her father and paternal grandfather; Thyroid disease in her mother.   ROS:  Please see the history of present illness.   Otherwise, review of systems is positive for none.   All other systems are reviewed and negative.   PHYSICAL EXAM: VS:  BP 132/82   Pulse 80   Ht '5\' 5"'$  (1.651 m)   Wt 226 lb (102.5 kg)   SpO2 95%   BMI 37.61 kg/m  , BMI Body mass index is 37.61 kg/m. GEN: Well nourished, well developed, in no acute distress  HEENT: normal  Neck: no JVD, carotid bruits, or masses Cardiac: RRR; no murmurs, rubs, or gallops,no edema  Respiratory:  clear to auscultation bilaterally, normal work of breathing GI: soft, nontender, nondistended, + BS MS: no deformity or atrophy  Skin: warm and dry, device site well healed Neuro:  Strength and sensation are intact Psych: euthymic mood, full affect  EKG:  EKG is ordered today. Personal review of the ekg ordered shows sinus rhythm, rate 80  Personal review of the device interrogation today. Results in [place]: 03/05/2022: ALT 19; BUN 20; Creatinine, Ser 0.99; Hemoglobin 17.7; Platelets 289; Potassium 4.4; Sodium 142    Lipid Panel     Component Value Date/Time   CHOL 187 03/05/2022 1040   TRIG 267 (H) 03/05/2022 1040   HDL 52 03/05/2022 1040   CHOLHDL 3.6 03/05/2022 1040   CHOLHDL 4.1 06/11/2008 0825   VLDL 23 06/11/2008 0825   LDLCALC 91 03/05/2022 1040     Wt Readings from Last 3 Encounters:  07/08/22 226 lb (102.5 kg)  03/05/22 229 lb (103.9 kg)  02/19/22 229 lb (103.9 kg)      Other studies Reviewed: Additional studies/ records that were reviewed today include: TTE 03/17/18 Review of the  above records today demonstrates:  - Left ventricle: The cavity size was normal. Systolic function was   severely reduced. The estimated ejection fraction was in the   range of 25% to 30%. Diffuse hypokinesis. The study is not   technically sufficient to allow evaluation of LV diastolic   function. - Aortic valve: Transvalvular velocity was within the normal range.   There was no stenosis. There was no regurgitation. - Mitral valve: Transvalvular velocity was within the normal range.   There was no evidence for stenosis. There was trivial   regurgitation. - Right ventricle: The cavity size was normal. Wall thickness was   normal. Systolic function was normal. - Atrial septum: No defect or patent foramen ovale was identified  by color flow Doppler. - Tricuspid valve: There was mild regurgitation. - Pulmonary arteries: Systolic pressure was within the normal   range. PA peak pressure: 26 mm Hg (S).   ASSESSMENT AND PLAN:  1.  Chronic systolic heart failure due to ischemic cardiomyopathy: Status post Biotronik ICD.  Currently on optimal medical therapy.  Device function appropriately.  No changes.  2.  Hypertension:well controlled  3.  Coronary artery disease: No current chest pain.  Continue plan per primary cardiology.  4.  Hyperlipidemia: Continue atorvastatin.  Goal LDL less than 70.  Plan per primary cardiology.   Current medicines are reviewed at length with the patient today.   The patient does not have concerns regarding her medicines.  The following changes were made today:  none  Labs/ tests ordered today include:  Orders Placed This Encounter  Procedures   EKG 12-Lead      Disposition:   FU with Allison White 1year  Signed, Allison Darius Meredith Leeds, MD  07/08/2022 12:06 PM     [place] [address] [place] [address] [phone] (office) [phone] (fax)

## 2022-07-08 NOTE — Patient Instructions (Signed)
Medication Instructions:  Your physician recommends that you continue on your current medications as directed. Please refer to the Current Medication list given to you today.  *If you need a refill on your cardiac medications before your next appointment, please call your pharmacy*   Lab Work: None ordered   Testing/Procedures: None ordered   Follow-Up: At [facility], you and your health needs are our priority.  As part of our continuing mission to provide you with exceptional heart care, we have created designated Provider Care Teams.  These Care Teams include your primary Cardiologist (physician) and Advanced Practice Providers (APPs -  Physician Assistants and Nurse Practitioners) who all work together to provide you with the care you need, when you need it.  Remote monitoring is used to monitor your Pacemaker or ICD from home. This monitoring reduces the number of office visits required to check your device to one time per year. It allows [place] to keep an eye on the functioning of your device to ensure it is working properly. You are scheduled for a device check from home on 09/12/2022. You may send your transmission at any time that day. If you have a wireless device, the transmission will be sent automatically. After your physician reviews your transmission, you will receive a postcard with your next transmission date.  Your next appointment:   1 year(s)  The format for your next appointment:   In Person  Provider:   Allegra Lai, MD    Thank you for choosing [facility]!!   Trinidad Curet, RN [phone]    Other Instructions   Important Information About Sugar

## 2022-07-11 ENCOUNTER — Ambulatory Visit (HOSPITAL_COMMUNITY)
Admission: RE | Admit: 2022-07-11 | Discharge: 2022-07-11 | Disposition: A | Discharge status: Home / Self Care | Payer: HMO | Source: Ambulatory Visit | Attending: Internal Medicine | Admitting: Internal Medicine

## 2022-07-11 ENCOUNTER — Encounter (HOSPITAL_COMMUNITY): Payer: Self-pay | Admitting: Internal Medicine

## 2022-07-11 VITALS — BP 112/70 | HR 69 | Wt 228.6 lb

## 2022-07-11 DIAGNOSIS — R002 Palpitations: Secondary | ICD-10-CM | POA: Diagnosis not present

## 2022-07-11 DIAGNOSIS — Z6838 Body mass index (BMI) 38.0-38.9, adult: Secondary | ICD-10-CM | POA: Insufficient documentation

## 2022-07-11 DIAGNOSIS — Z95810 Presence of automatic (implantable) cardiac defibrillator: Secondary | ICD-10-CM | POA: Diagnosis not present

## 2022-07-11 DIAGNOSIS — Z7984 Long term (current) use of oral hypoglycemic drugs: Secondary | ICD-10-CM | POA: Insufficient documentation

## 2022-07-11 DIAGNOSIS — Z79899 Other long term (current) drug therapy: Secondary | ICD-10-CM | POA: Insufficient documentation

## 2022-07-11 DIAGNOSIS — Z853 Personal history of malignant neoplasm of breast: Secondary | ICD-10-CM | POA: Diagnosis not present

## 2022-07-11 DIAGNOSIS — R42 Dizziness and giddiness: Secondary | ICD-10-CM | POA: Insufficient documentation

## 2022-07-11 DIAGNOSIS — E669 Obesity, unspecified: Secondary | ICD-10-CM | POA: Diagnosis not present

## 2022-07-11 DIAGNOSIS — I5022 Chronic systolic (congestive) heart failure: Secondary | ICD-10-CM | POA: Insufficient documentation

## 2022-07-11 DIAGNOSIS — D751 Secondary polycythemia: Secondary | ICD-10-CM | POA: Insufficient documentation

## 2022-07-11 DIAGNOSIS — Z955 Presence of coronary angioplasty implant and graft: Secondary | ICD-10-CM | POA: Diagnosis not present

## 2022-07-11 DIAGNOSIS — I729 Aneurysm of unspecified site: Secondary | ICD-10-CM | POA: Diagnosis not present

## 2022-07-11 DIAGNOSIS — I2510 Atherosclerotic heart disease of native coronary artery without angina pectoris: Secondary | ICD-10-CM | POA: Diagnosis not present

## 2022-07-11 LAB — BASIC METABOLIC PANEL
Anion gap: 9 (ref 5–15)
BUN: 13 mg/dL (ref 6–20)
CO2: 28 mmol/L (ref 22–32)
Calcium: 10.1 mg/dL (ref 8.9–10.3)
Chloride: 100 mmol/L (ref 98–111)
Creatinine, Ser: 0.9 mg/dL (ref 0.44–1.00)
GFR, Estimated: >60 mL/min (ref >60)
Glucose, Bld: 116 mg/dL — ABNORMAL HIGH (ref 70–99)
Potassium: 4.5 mmol/L (ref 3.5–5.1)
Sodium: 137 mmol/L (ref 135–145)

## 2022-07-11 LAB — BRAIN NATRIURETIC PEPTIDE: B Natriuretic Peptide: 122.8 pg/mL — ABNORMAL HIGH (ref 0.0–100.0)

## 2022-07-11 NOTE — Progress Notes (Signed)
Advanced [facility] Failure Clinic Note  Date:  07/11/2022   ID:  Allison White, DOB Jan 11, 1961, MRN [id number]  Location: Home  Provider location: [facility] Type of Visit: Established patient  PCP:  Lillard Anes, MD  Cardiologist:  None Primary HF: Camron Monday  Chief Complaint: Heart Failure follow-up   History of Present Illness:  Allison White is a 61 y.o. female with morbid obesity, HTN, carotid artery dissection with stenting of left carotid due to possible FMD, CAD s/p anterior MI 1/18 (DES to LAD), systolic HF with EF 38-75% s/p Biotronik ICD.   Seen in 5/21 with worsening NYHA IIIb symptoms. Had R/L cath with normal coronary arteries; widely patent LAD stent, EF 20-25%, Normal hemodynamics with CI 3.4. -> CPX  = pVO2: 15.3 (90% predicted pVO2) - adjusted to iBW 25.10m/kg/min VE/VCO2 slope:  33   Seen in HF clinic 01/2021 with orthostasis. Reds 30%. Recommended ted hose. Sleep study deferred at patient/husband request.   Had lumpectomy 9/22 for abnormal mammogram. Finished 6 weeks of XRT in 12/22. No chemo.   Echo 11/22 EF 25-30% G1DD  RV ok   Here for routine f/u. Says she is swimming a lot (kicking with a floatie) and gardening. No CP. Gets SOB easily. Says she swells in the heat. Chronic dizziness. But no presyncope.   Cardiac Studies Echo 06/2017 EF 20-25% Echo 02/2018 LVEF 25-30%, Trivial MR, Normal RV, Mild TR, PA peak pressure 26 mm Hg Echo 8/20  EF 20-25% RV ok. Echo 12/21 EF 25%   CPX 8/21 FVC 3.08 (91%)      FEV1 2.30 (87%%)        FEV1/FVC 75 (94%)        MVV 89 (94%)  RER 1.10 Peak VO2: 15.3 (90% predicted peak VO2) - adjusted to iBW 25.620mkg/min VE/VCO2 slope:  33   Studies: R/L cath 04/07/20 Ao = 102/56 (75) LV = 104/13 RA = 3 RV = 27/4 PA = 28/9 (17) PCW = 7 Fick cardiac output/index = 7.2/3.4 PVR = 1.4 WU FA sat = 99% PA sat = 79%, 81% High SVC sat = 81%  Monitor 10/19 1. Sinus rhythm 2. Rare PVCs  and bigeminy (< 1.0%) 3. Two patient-triggered events associated with isolated PVCs.  4. No high-grade arrhythmias     CPX 03/17/18 FVC 3.00 (87%), FEV1 2.97(88%), FEV1/FVC 79 (99%)  Peak VO2: 13.8 - When adjusted to the patient's ideal body weight of 142.2 lb (64.5 kg) the peak VO2 is 22.6 ml/kg VE/VCO2 slope: 38 OUES: 1.52 Peak RER: 1.11 Ventilatory Threshold: 11.8 VE/MVV:  95% O2pulse:  8 Interpretation: Mild limitation due to HF and obesity    Past Medical History:  Diagnosis Date   Cancer ([facility]   Rt Breast   CHF (congestive heart failure) ([facility]   FMD (facioscapulohumeral muscular dystrophy) ([facility]   Headache(784.0)    Heart failure (HCC)    Hyperlipidemia    MI (myocardial infarction) ([facility]   Migraine    Nonruptured cerebral aneurysm, internal carotid artery    Left side, stent placement (2009)   Pseudoaneurysm ([facility]   both carotids    Vertigo    Past Surgical History:  Procedure Laterality Date   ABDOMINAL HYSTERECTOMY     BREAST LUMPECTOMY Right 07/2021   CARDIAC DEFIBRILLATOR PLACEMENT     CEREBRAL ANEURYSM REPAIR Left 2009   CESAREAN SECTION     CORONARY ANGIOPLASTY WITH STENT PLACEMENT  PACEMAKER IMPLANT     RIGHT/LEFT HEART CATH AND CORONARY ANGIOGRAPHY N/A 04/07/2020   Procedure: RIGHT/LEFT HEART CATH AND CORONARY ANGIOGRAPHY;  Surgeon: Jolaine Artist, MD;  Location: [facility];  Service: Cardiovascular;  Laterality: N/A;   TUBAL LIGATION       Current Outpatient Medications  Medication Sig Dispense Refill   aspirin 81 MG EC tablet Take 81 mg by mouth daily.      dapagliflozin propanediol (FARXIGA) 10 MG TABS tablet Take 1 tablet (10 mg total) by mouth daily. 90 tablet 3   digoxin (LANOXIN) 0.125 MG tablet Take 1 tablet by mouth once daily 90 tablet 3   ezetimibe (ZETIA) 10 MG tablet Take 1 tablet (10 mg total) by mouth daily. 90 tablet 3   HYDROcodone-acetaminophen (NORCO) 7.5-325 MG tablet Take 1 tablet by mouth every 6 (six)  hours as needed for moderate pain. 90 tablet 0   ivabradine (CORLANOR) 7.5 MG TABS tablet Take 1 tablet (7.5 mg total) by mouth 2 (two) times daily with a meal. 180 tablet 3   LORATADINE PO Take by mouth daily. [facility] brand     metoprolol succinate (TOPROL-XL) 50 MG 24 hr tablet Take 1 tablet by mouth twice daily 180 tablet 3   nitroGLYCERIN (NITROSTAT) 0.4 MG SL tablet Place 0.4 mg under the tongue every 5 (five) minutes as needed for chest pain.     Phenazopyridine HCl (AZO TABS PO) Take 1 capsule by mouth as needed.     raloxifene (EVISTA) 60 MG tablet Take 1 tablet (60 mg total) by mouth daily. 90 tablet 3   spironolactone (ALDACTONE) 25 MG tablet Take 1 tablet by mouth once daily 90 tablet 2   torsemide (DEMADEX) 100 MG tablet Take 1/2 (one-half) tablet by mouth once daily 45 tablet 3   No current facility-administered medications for this encounter.    Allergies:   Tape, Plavix [clopidogrel bisulfate], and Tamoxifen   Social History:  The patient  reports that she has quit smoking. Her smoking use included cigarettes. She has a 6.00 pack-year smoking history. She has never used smokeless tobacco. She reports current alcohol use. She reports that she does not use drugs.   Family History:  The patient's family history includes Cerebral aneurysm in her paternal grandmother; Dementia in her paternal grandfather; Hypertension in her brother; Prostate cancer in her father and paternal grandfather; Thyroid disease in her mother.   ROS:  Please see the history of present illness.   All other systems are personally reviewed and negative.   Body mass index is 38.04 kg/m.  Vitals:   07/11/22 1439  BP: 112/70  Pulse: 69  SpO2: 98%  Weight: 103.7 kg (228 lb 9.6 oz)     Wt Readings from Last 3 Encounters:  07/11/22 103.7 kg (228 lb 9.6 oz)  07/08/22 102.5 kg (226 lb)  03/05/22 103.9 kg (229 lb)    Exam:   General:  Well appearing. No resp difficulty HEENT: normal Neck: supple. no  JVD. Carotids 2+ bilat; no bruits. No lymphadenopathy or thryomegaly appreciated. Cor: PMI nondisplaced. Regular rate & rhythm. No rubs, gallops or murmurs. Lungs: clear Abdomen: obese soft, nontender, nondistended. No hepatosplenomegaly. No bruits or masses. Good bowel sounds. Extremities: no cyanosis, clubbing, rash, edema Neuro: alert & orientedx3, cranial nerves grossly intact. moves all 4 extremities w/o difficulty. Affect pleasant    Recent Labs: 03/05/2022: ALT 19; BUN 20; Creatinine, Ser 0.99; Hemoglobin 17.7; Platelets 289; Potassium 4.4; Sodium 142  Wt Readings from Last 3 Encounters:  07/11/22 103.7 kg (228 lb 9.6 oz)  07/08/22 102.5 kg (226 lb)  03/05/22 103.9 kg (229 lb)      ASSESSMENT AND PLAN:  1. Chronic systolic HF, ICM - Echo 04/3784 LVEF 25-30%, Trivial MR, Normal RV, Mild TR, PA peak pressure 26 mm Hg - s/p Biotronik ICD - Echo 07/13/19 EF 20-25% - Cath 5/21 EF 20-25% normal coronaries. Well compensated hemodynamics with CI 3.4 - Echo 12/21 EF 25% - CPX 8/21 very reassuring - Peak VO2: 15.3 (90% predicted peak VO2) - adjusted to iBW 25.80m/kg/min. VEVCO2 33  - Echo 11/22 EF 25-30% G1DD  RV ok  - Stable NYHA II-III - Volume status stable on torsemide '50mg'$  daily - Continue Entresto 24/26 bid BP marginal will not titrate - Continue spiro 25 mg daily.  - Continue toprol 50 mg BID - Continue digoxin 0.125 mg.  - Continue Farxiga 10 mg daily.  - Continue ivabradine 7.5 bid  2. CAD s/p anterior MI 1/18 with DES to LAD in [place] - cath 5/21 normal cors with patent LAD stent - no s/s ischemia - Failed statin. Now on Zetia - Goal LDL < 70. Followed by Dr. PHenrene Pastor   3. Obesity Body mass index is 38.04 kg/m.   4. 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 No changes   5. Palpitations - Zio patch 10/19 was ok  - We  discussed getting AliveCor device if these persist   6. Breast Cancer - s/p lumpectomy 9/22 followed by XRT. Completed 12/22 - no plans for chemo  7. Polycythemia - followed by hematology. BMBx was normal - Home sleep study 1/22 AHI 4.6    Signed, DGlori Bickers MD  07/11/2022 2:55 PM  [id number]Heart and [phone](office) [phone](fax)

## 2022-07-11 NOTE — Addendum Note (Signed)
Encounter addended by: Jerl Mina, RN on: 07/11/2022 3:08 PM  Actions taken: Charge Capture section accepted, Clinical Note Signed, Order list changed, Diagnosis association updated

## 2022-07-11 NOTE — Patient Instructions (Signed)
There has been no changes to your medications.  Labs done today, your results will be available in MyChart, we will contact you for abnormal readings.   Your physician has requested that you have an echocardiogram. Echocardiography is a painless test that uses sound waves to create images of your heart. It provides your doctor with information about the size and shape of your heart and how well your heart's chambers and valves are working. This procedure takes approximately one hour. There are no restrictions for this procedure.  Your physician recommends that you schedule a follow-up appointment in: 6 months with an echocardiogram  ( February 2024) ** please call the office in November 2023 to arrange your follow up appointment **  If you have any questions or concerns before your next appointment please send [place] a message through [place] or call our office at [phone].    TO LEAVE A MESSAGE FOR THE NURSE SELECT OPTION 2, PLEASE LEAVE A MESSAGE INCLUDING: YOUR NAME DATE OF BIRTH CALL BACK NUMBER REASON FOR CALL**this is important as we prioritize the call backs  YOU WILL RECEIVE A CALL BACK THE SAME DAY AS LONG AS YOU CALL BEFORE 4:00 PM  At the [facility], you and your health needs are our priority. As part of our continuing mission to provide you with exceptional heart care, we have created designated Provider Care Teams. These Care Teams include your primary Cardiologist (physician) and Advanced Practice Providers (APPs- Physician Assistants and Nurse Practitioners) who all work together to provide you with the care you need, when you need it.   You may see any of the following providers on your designated Care Team at your next follow up: Dr Glori Bickers Dr Haynes Kerns, NP Lyda Jester, [place] [facility] [place], [place] Audry Riles, PharmD   Please be sure to bring in all your medications bottles to every appointment.

## 2022-07-11 NOTE — Addendum Note (Signed)
Encounter addended by: Stanford Scotland, RN on: 07/11/2022 3:18 PM  Actions taken: Order list changed, Diagnosis association updated

## 2022-07-12 ENCOUNTER — Other Ambulatory Visit (HOSPITAL_COMMUNITY): Payer: Self-pay | Admitting: *Deleted

## 2022-07-12 ENCOUNTER — Encounter: Payer: Self-pay | Admitting: Nurse Practitioner

## 2022-07-12 ENCOUNTER — Ambulatory Visit (INDEPENDENT_AMBULATORY_CARE_PROVIDER_SITE_OTHER): Payer: HMO | Admitting: Nurse Practitioner

## 2022-07-12 VITALS — BP 108/80 | HR 72 | Temp 97.2°F | Ht 65.0 in | Wt 229.0 lb

## 2022-07-12 DIAGNOSIS — Z8719 Personal history of other diseases of the digestive system: Secondary | ICD-10-CM | POA: Diagnosis not present

## 2022-07-12 DIAGNOSIS — K5792 Diverticulitis of intestine, part unspecified, without perforation or abscess without bleeding: Secondary | ICD-10-CM | POA: Diagnosis not present

## 2022-07-12 MED ORDER — METRONIDAZOLE 500 MG PO TABS: 500.0000 mg | ORAL_TABLET | Freq: Three times a day (TID) | ORAL | 0 refills | Status: AC

## 2022-07-12 MED ORDER — DAPAGLIFLOZIN PROPANEDIOL 10 MG PO TABS: 10.0000 mg | ORAL_TABLET | Freq: Every day | ORAL | 3 refills | Status: DC

## 2022-07-12 NOTE — Patient Instructions (Addendum)
Clear liquid diet, advance as tolerated Take Flagyl three time daily for 7 days as prescribed Follow-up as needed   Diverticulitis  Diverticulitis is when small pouches in your colon (large intestine) get infected or swollen. This causes pain in the belly (abdomen) and watery poop (diarrhea). These pouches are called diverticula. The pouches form in people who have a condition called diverticulosis. What are the causes? This condition may be caused by poop (stool) that gets trapped in the pouches in your colon. The poop lets germs (bacteria) grow in the pouches. This causes the infection. What increases the risk? You are more likely to get this condition if you have small pouches in your colon. The risk is higher if: You are overweight or very overweight (obese). You do not exercise enough. You drink alcohol. You smoke or use products with tobacco in them. You eat a diet that has a lot of red meat such as beef, pork, or lamb. You eat a diet that does not have enough fiber in it. You are older than 60 years of age. What are the signs or symptoms? Pain in the belly. Pain is often on the left side, but it may be in other areas. Fever and feeling cold. Feeling like you may vomit. Vomiting. Having cramps. Feeling full. Changes to how often you poop. Blood in your poop. How is this treated? Most cases are treated at home by: Taking over-the-counter pain medicines. Following a clear liquid diet. Taking antibiotic medicines. Resting. Very bad cases may need to be treated at a hospital. This may include: Not eating or drinking. Taking prescription pain medicine. Getting antibiotic medicines through an IV tube. Getting fluid and food through an IV tube. Having surgery. When you are feeling better, your doctor may tell you to have a test to check your colon (colonoscopy). Follow these instructions at home: Medicines Take over-the-counter and prescription medicines only as told by  your doctor. These include: Antibiotics. Pain medicines. Fiber pills. Probiotics. Stool softeners. If you were prescribed an antibiotic medicine, take it as told by your doctor. Do not stop taking the antibiotic even if you start to feel better. Ask your doctor if the medicine prescribed to you requires you to avoid driving or using machinery. Eating and drinking  Follow a diet as told by your doctor. When you feel better, your doctor may tell you to change your diet. You may need to eat a lot of fiber. Fiber makes it easier to poop (have a bowel movement). Foods with fiber include: Berries. Beans. Lentils. Green vegetables. Avoid eating red meat. General instructions Do not use any products that contain nicotine or tobacco, such as cigarettes, e-cigarettes, and chewing tobacco. If you need help quitting, ask your doctor. Exercise 3 or more times a week. Try to get 30 minutes each time. Exercise enough to sweat and make your heart beat faster. Keep all follow-up visits as told by your doctor. This is important. Contact a doctor if: Your pain does not get better. You are not pooping like normal. Get help right away if: Your pain gets worse. Your symptoms do not get better. Your symptoms get worse very fast. You have a fever. You vomit more than one time. You have poop that is: Bloody. Black. Tarry. Summary This condition happens when small pouches in your colon get infected or swollen. Take medicines only as told by your doctor. Follow a diet as told by your doctor. Keep all follow-up visits as told by your doctor.  This is important. This information is not intended to replace advice given to you by your health care provider. Make sure you discuss any questions you have with your health care provider. Document Revised: 08/16/2019 Document Reviewed: 08/16/2019 Elsevier Patient Education  [place].

## 2022-07-12 NOTE — Progress Notes (Signed)
Acute Office Visit  Subjective:    Patient ID: Allison White, female    DOB: 15-Jan-1961, 61 y.o.   MRN: [id number]  CC: LLQ abd pain  HPI: Patient is in today for Abdominal Pain  She reports new onset abdominal pain. The most recent episode started  5 days ago and is worsening. The abdominal pain is located in the left lower quadrant with radiation to "across lower abdomen" .  It is described as sharp, is moderate in intensity, occurring constantly. Pt reports mucousy stools. Denies bloody stools or rectal bleeding, nausea or vomiting. It is aggravated by  sitting up straight  and is relieved by  Pain med-Vicodin . Started taking old rx of keflex 500 mg and metronidazole. Has hx of diverticulitis. Has appt with [facility] 08/28/2022.    Previous labs Lab Results  Component Value Date   WBC 8.0 03/05/2022   HGB 17.7 (H) 03/05/2022   HCT 50.8 (H) 03/05/2022   MCV 90 03/05/2022   MCH 31.3 03/05/2022   RDW 12.4 03/05/2022   PLT 289 03/05/2022   Lab Results  Component Value Date   GLUCOSE 116 (H) 07/11/2022   NA 137 07/11/2022   K 4.5 07/11/2022   CL 100 07/11/2022   CO2 28 07/11/2022   BUN 13 07/11/2022   CREATININE 0.90 07/11/2022   GFRNONAA >60 07/11/2022   GFRAA 70 01/01/2021   CALCIUM 10.1 07/11/2022   PROT 7.7 03/05/2022   ALBUMIN 4.9 03/05/2022   LABGLOB 2.8 03/05/2022   AGRATIO 1.8 03/05/2022   BILITOT 0.6 03/05/2022   ALKPHOS 92 03/05/2022   AST 21 03/05/2022   ALT 19 03/05/2022   ANIONGAP 9 07/11/2022     Past Medical History:  Diagnosis Date   Cancer ([place]) 2022   Rt Breast   CHF (congestive heart failure) ([place])    FMD (facioscapulohumeral muscular dystrophy) ([place])    Headache(784.0)    Heart failure ([facility])    Hyperlipidemia    MI (myocardial infarction) ([place])    Migraine    Nonruptured cerebral aneurysm, internal carotid artery    Left side, stent placement (2009)   Pseudoaneurysm ([place])    both carotids    Vertigo     Past Surgical History:   Procedure Laterality Date   ABDOMINAL HYSTERECTOMY     BREAST LUMPECTOMY Right 07/2021   CARDIAC DEFIBRILLATOR PLACEMENT     CEREBRAL ANEURYSM REPAIR Left 2009   CESAREAN SECTION     CORONARY ANGIOPLASTY WITH STENT PLACEMENT     PACEMAKER IMPLANT     RIGHT/LEFT HEART CATH AND CORONARY ANGIOGRAPHY N/A 04/07/2020   Procedure: RIGHT/LEFT HEART CATH AND CORONARY ANGIOGRAPHY;  Surgeon: Jolaine Artist, MD;  Location: [facility];  Service: Cardiovascular;  Laterality: N/A;   TUBAL LIGATION      Family History  Problem Relation Age of Onset   Thyroid disease Mother    Prostate cancer Father        Prostate   Cerebral aneurysm Paternal Grandmother        Nonruptured   Dementia Paternal Grandfather    Prostate cancer Paternal Grandfather    Hypertension Brother    Breast cancer Neg Hx     Social History   Socioeconomic History   Marital status: Married    Spouse name: Tim   Number of children: 4   Years of education: Not on file   Highest education level: High school graduate  Occupational History   Not on file  Tobacco  Use   Smoking status: Former    Packs/day: 1.00    Years: 6.00    Total pack years: 6.00    Types: Cigarettes   Smokeless tobacco: Never   Tobacco comments:    Quit over 30 years ago  Vaping Use   Vaping Use: Never used  Substance and Sexual Activity   Alcohol use: Yes    Comment: Occasionally "little"   Drug use: No   Sexual activity: Not Currently  Other Topics Concern   Not on file  Social History Narrative   Lives with husband in a one-story home.     Right handed   Caffeine: "very little"   Social Determinants of Health   Financial Resource Strain: High Risk (02/18/2022)   Overall Financial Resource Strain (CARDIA)    Difficulty of Paying Living Expenses: Hard  Food Insecurity: No Food Insecurity (02/03/2020)   Hunger Vital Sign    Worried About Running Out of Food in the Last Year: Never true    Ran Out of Food in the Last  Year: Never true  Transportation Needs: No Transportation Needs (02/18/2022)   PRAPARE - Hydrologist (Medical): No    Lack of Transportation (Non-Medical): No  Physical Activity: Sufficiently Active (02/03/2020)   Exercise Vital Sign    Days of Exercise per Week: 4 days    Minutes of Exercise per Session: 90 min  Stress: No Stress Concern Present (02/03/2020)   [place]    Feeling of Stress : Not at all  Social Connections: Moderately Integrated (10/26/2020)   Social Connection and Isolation Panel [NHANES]    Frequency of Communication with Friends and Family: More than three times a week    Frequency of Social Gatherings with Friends and Family: Twice a week    Attends Religious Services: 1 to 4 times per year    Active Member of [employer] or Organizations: No    Attends Archivist Meetings: Never    Marital Status: Married  Human resources officer Violence: Not At Risk (10/26/2020)   Humiliation, Afraid, Rape, and Kick questionnaire    Fear of Current or Ex-Partner: No    Emotionally Abused: No    Physically Abused: No    Sexually Abused: No    Outpatient Medications Prior to Visit  Medication Sig Dispense Refill   aspirin 81 MG EC tablet Take 81 mg by mouth daily.      dapagliflozin propanediol (FARXIGA) 10 MG TABS tablet Take 1 tablet (10 mg total) by mouth daily. 90 tablet 3   digoxin (LANOXIN) 0.125 MG tablet Take 1 tablet by mouth once daily 90 tablet 3   ezetimibe (ZETIA) 10 MG tablet Take 1 tablet (10 mg total) by mouth daily. 90 tablet 3   HYDROcodone-acetaminophen (NORCO) 7.5-325 MG tablet Take 1 tablet by mouth every 6 (six) hours as needed for moderate pain. 90 tablet 0   ivabradine (CORLANOR) 7.5 MG TABS tablet Take 1 tablet (7.5 mg total) by mouth 2 (two) times daily with a meal. 180 tablet 3   LORATADINE PO Take by mouth daily. [facility] brand     metoprolol succinate  (TOPROL-XL) 50 MG 24 hr tablet Take 1 tablet by mouth twice daily 180 tablet 3   nitroGLYCERIN (NITROSTAT) 0.4 MG SL tablet Place 0.4 mg under the tongue every 5 (five) minutes as needed for chest pain.     Phenazopyridine HCl (AZO TABS PO) Take 1 capsule  by mouth as needed.     raloxifene (EVISTA) 60 MG tablet Take 1 tablet (60 mg total) by mouth daily. 90 tablet 3   spironolactone (ALDACTONE) 25 MG tablet Take 1 tablet by mouth once daily 90 tablet 2   torsemide (DEMADEX) 100 MG tablet Take 1/2 (one-half) tablet by mouth once daily 45 tablet 3   No facility-administered medications prior to visit.    Allergies  Allergen Reactions   Tape Rash    PREFERS CLOTH OR NOTHING   Plavix [Clopidogrel Bisulfate] Other (See Comments)    Joint pain   Tamoxifen Nausea And Vomiting    Review of Systems See pertinent positives and negatives per HPI.     Objective:   BP 108/80   Pulse 72   Temp (!) 97.2 F (36.2 C)   Ht _0  (1.651 m)   Wt 229 lb (103.9 kg)   SpO2 99%   BMI 38.11 kg/m   Physical Exam Vitals reviewed.  Constitutional:      Appearance: She is well-developed.  HENT:     Right Ear: Tympanic membrane normal.     Left Ear: Tympanic membrane normal.     Nose: Nose normal.     Mouth/Throat:     Mouth: Mucous membranes are moist.  Cardiovascular:     Rate and Rhythm: Normal rate and regular rhythm.     Pulses: Normal pulses.     Heart sounds: Normal heart sounds.  Pulmonary:     Effort: Pulmonary effort is normal.     Breath sounds: Normal breath sounds.  Abdominal:     General: Bowel sounds are normal.     Palpations: Abdomen is soft.     Tenderness: There is abdominal tenderness in the left lower quadrant.  Skin:    General: Skin is warm and dry.     Capillary Refill: Capillary refill takes less than 2 seconds.  Neurological:     General: No focal deficit present.     Mental Status: She is alert and oriented to person, place, and time.  Psychiatric:        Mood  and Affect: Mood normal.        Behavior: Behavior normal.      Wt Readings from Last 3 Encounters:  07/11/22 228 lb 9.6 oz (103.7 kg)  07/08/22 226 lb (102.5 kg)  03/05/22 229 lb (103.9 kg)    Health Maintenance Due  Topic Date Due   URINE MICROALBUMIN  Never done   HIV Screening  Never done   Hepatitis C Screening  Never done   TETANUS/TDAP  Never done   Zoster Vaccines- Shingrix (1 of 2) Never done   COLONOSCOPY (Pts 45-6yr Insurance coverage will need to be confirmed)  Never done   COVID-19 Vaccine (3 - Pfizer risk series) 04/17/2020   OPHTHALMOLOGY EXAM  10/25/2021   FOOT EXAM  04/03/2022   HEMOGLOBIN A1C  06/02/2022   INFLUENZA VACCINE  06/18/2022       Lab Results  Component Value Date   TSH 2.880 07/17/2020   Lab Results  Component Value Date   WBC 8.0 03/05/2022   HGB 17.7 (H) 03/05/2022   HCT 50.8 (H) 03/05/2022   MCV 90 03/05/2022   PLT 289 03/05/2022   Lab Results  Component Value Date   NA 137 07/11/2022   K 4.5 07/11/2022   CO2 28 07/11/2022   GLUCOSE 116 (H) 07/11/2022   BUN 13 07/11/2022   CREATININE 0.90 07/11/2022  BILITOT 0.6 03/05/2022   ALKPHOS 92 03/05/2022   AST 21 03/05/2022   ALT 19 03/05/2022   PROT 7.7 03/05/2022   ALBUMIN 4.9 03/05/2022   CALCIUM 10.1 07/11/2022   ANIONGAP 9 07/11/2022   EGFR 65 03/05/2022   GFR 71.82 07/22/2013   Lab Results  Component Value Date   CHOL 187 03/05/2022   Lab Results  Component Value Date   HDL 52 03/05/2022   Lab Results  Component Value Date   LDLCALC 91 03/05/2022   Lab Results  Component Value Date   TRIG 267 (H) 03/05/2022   Lab Results  Component Value Date   CHOLHDL 3.6 03/05/2022   Lab Results  Component Value Date   HGBA1C 7.3 (H) 12/03/2021       Assessment & Plan:    1. Acute diverticulitis - metroNIDAZOLE (FLAGYL) 500 MG tablet; Take 1 tablet (500 mg total) by mouth 3 (three) times daily for 7 days.  Dispense: 21 tablet; Refill: 0  2. History of  diverticulosis   Clear liquid diet, advance as tolerated Take Flagyl three time daily for 7 days as prescribed Follow-up as needed    Follow-up: PRN  An After Visit Summary was printed and given to the patient.  I, Rip Harbour, NP, have reviewed all documentation for this visit. The documentation on 07/12/22 for the exam, diagnosis, procedures, and orders are all accurate and complete.    Signed Rip Harbour, NP [place] [phone]

## 2022-07-14 ENCOUNTER — Other Ambulatory Visit: Payer: Self-pay | Admitting: Legal Medicine

## 2022-07-14 DIAGNOSIS — G894 Chronic pain syndrome: Secondary | ICD-10-CM

## 2022-07-14 DIAGNOSIS — I773 Arterial fibromuscular dysplasia: Secondary | ICD-10-CM

## 2022-07-15 MED ORDER — HYDROCODONE-ACETAMINOPHEN 7.5-325 MG PO TABS: 1.0000 | ORAL_TABLET | Freq: Four times a day (QID) | ORAL | 0 refills | Status: DC | PRN

## 2022-08-20 ENCOUNTER — Ambulatory Visit (INDEPENDENT_AMBULATORY_CARE_PROVIDER_SITE_OTHER): Payer: HMO

## 2022-08-20 DIAGNOSIS — I110 Hypertensive heart disease with heart failure: Secondary | ICD-10-CM

## 2022-08-20 DIAGNOSIS — I5022 Chronic systolic (congestive) heart failure: Secondary | ICD-10-CM

## 2022-08-20 DIAGNOSIS — E782 Mixed hyperlipidemia: Secondary | ICD-10-CM

## 2022-08-20 NOTE — Patient Instructions (Signed)
Visit Information   Goals Addressed   None    Patient Care Plan: CCM Pharmacy Care Plan     Problem Identified: htn, hld, heart failure   Priority: High  Onset Date: 07/06/2021     Long-Range Goal: Disease State Management   Start Date: 07/06/2021  Expected End Date: 07/06/2022  Recent Progress: On track  Priority: High  Note:   Current Barriers:  Unable to independently afford treatment regimen  Pharmacist Clinical Goal(s):  Patient will verbalize ability to afford treatment regimen through collaboration with PharmD and provider.   Interventions: 1:1 collaboration with Lillard Anes, MD regarding development and update of comprehensive plan of care as evidenced by provider attestation and co-signature Inter-disciplinary care team collaboration (see longitudinal plan of care) Comprehensive medication review performed; medication list updated in electronic medical record  (BP goal <130/80) BP Readings from Last 3 Encounters:  07/12/22 108/80  07/11/22 112/70  07/08/22 132/82  -Controlled -Current treatment: Metoprolol succinate 50 mg twice daily Appropriate, Effective, Safe, Accessible Spironolactone 25 mg daily Appropriate, Effective, Safe, Accessible -Medications previously tried: valsartan, Delene Loll -Current home readings:  March 2023: 110/60 October 2023: Doesn't test BP -Current dietary habits: eats small portions but tries to keep healthy diet -Current exercise habits: limited  -Denies hypotensive/hypertensive symptoms -Educated on BP goals and benefits of medications for prevention of heart attack, stroke and kidney damage; Daily salt intake goal < 2300 mg; Exercise goal of 150 minutes per week; -Counseled to monitor BP at home weekly, document, and provide log at future appointments -Recommended to continue current medication  Hyperlipidemia: (LDL goal < 55) The 10-year ASCVD risk score (Arnett DK, et al., 2019) is: 6%   Values used to calculate the  score:     Age: 62 years     Sex: Female     Is Non-Hispanic African American: No     Diabetic: Yes     Tobacco smoker: No     Systolic Blood Pressure: 478 mmHg     Is BP treated: Yes     HDL Cholesterol: 52 mg/dL     Total Cholesterol: 187 mg/dL Lab Results  Component Value Date   CHOL 187 03/05/2022   CHOL 181 12/03/2021   CHOL 190 08/03/2021   Lab Results  Component Value Date   HDL 52 03/05/2022   HDL 46 12/03/2021   HDL 45 08/03/2021   Lab Results  Component Value Date   LDLCALC 91 03/05/2022   LDLCALC 93 12/03/2021   LDLCALC 110 (H) 08/03/2021   Lab Results  Component Value Date   TRIG 267 (H) 03/05/2022   TRIG 251 (H) 12/03/2021   TRIG 203 (H) 08/03/2021   Lab Results  Component Value Date   CHOLHDL 3.6 03/05/2022   CHOLHDL 3.9 12/03/2021   CHOLHDL 4.2 08/03/2021  No results found for: "LDLDIRECT" -Not ideally controlled -Current treatment: Aspirin ec 81 mg daily Appropriate, Effective, Safe, Accessible -Medications previously tried: Atorvastatin (Muscle aches), Rosuvastatin (Muscle aches) -Current dietary patterns: little appetite but patient is trying to eat healthy -Current exercise habits: limited -Educated on Cholesterol goals;  Benefits of statin for ASCVD risk reduction; Exercise goal of 150 minutes per week; March 2023: Patient stopped Rosuvastatin due to muscle aches. Recommend repeat thyroid and Vit D  Heart Failure (Goal: manage symptoms and prevent exacerbations) -Controlled -Last ejection fraction: 25%  (Date: 10/2020) -HF type: Systolic -NYHA Class: III (marked limitation of activity) -Current treatment: Farxiga 10 mg daily Appropriate, Effective, Safe, Accessible PAP by  Cardio Digoxin 0.125 mg daily Appropriate, Effective, Safe, Accessible Ivabradine 7.5 mg bid with a meal Appropriate, Effective, Safe, Accessible PAP by Cardio Metoprolol Succinate 50 mg bid Appropriate, Effective, Safe, Accessible Nitroglcyerin 0.4 mg every 5  minutes prn chest pain Appropriate, Effective, Safe, Accessible Torsemide 50 mg daily Appropriate, Effective, Safe, Accessible Spironolactone 25 mg daily Appropriate, Effective, Safe, Accessible -Medications previously tried: Ship broker, valsartan -Current home BP/HR readings: patient reports bp runs low -Current dietary habits: very little appetite -Current exercise habits: limited  -Educated on Benefits of medications for managing symptoms and prolonging life Importance of weighing daily; if you gain more than 3 pounds in one day or 5 pounds in one week,  -Recommended to continue current medication  Migraine (Goal: prevent/manage migraines) -Not ideally controlled -Current treatment  Hydrocodone 7.5/325 mg every 6 hours prn Query Appropriate, Query effective, Query Safe, Query accessible -Medications previously tried: Ajovy, botox March 2023: Main priority for this patient today was statin, would like to get off CS for headache management in the future October 2023; Patient states Ajovy stopped working so she stopped taking it. Has headaches every day. Asked when the last time she spoke with Neuro was. She states over a year ago. Counseled it'd be a good idea to go back but she doesn't think there's anything they can do so she doesn't want to re-schedule. Could be rebound headaches from Hydrocodone  Breast Cancer -Managed by Lavera Guise -Lumpectomy September 2022 -Current treatment  Raloxifene '60mg'$  Appropriate, Effective, Safe, Accessible Started 02/19/22 -Tried/Failed: Tamoxifen '20mg'$  (Stopped November 2022 due to [facility]) -Radiation starting mid October 2022 -Recommended to continue current medication  Patient Goals/Self-Care Activities Patient will:  - take medications as prescribed focus on medication adherence by using pill box target a minimum of 150 minutes of moderate intensity exercise weekly   Follow Up Plan: Telephone follow up appointment with care management team member  scheduled for: 6 months  CPP F/U April 2023  [place] Constable, Sherian Rein.D. - [phone]      Ms. Hara was given information about Chronic Care Management services today including:  CCM service includes personalized support from designated clinical staff supervised by her physician, including individualized plan of care and coordination with other care providers 24/7 contact phone numbers for assistance for urgent and routine care needs. Standard insurance, coinsurance, copays and deductibles apply for chronic care management only during months in which we provide at least 20 minutes of these services. Most insurances cover these services at 100%, however patients may be responsible for any copay, coinsurance and/or deductible if applicable. This service may help you avoid the need for more expensive face-to-face services. Only one practitioner may furnish and bill the service in a calendar month. The patient may stop CCM services at any time (effective at the end of the month) by phone call to the office staff.  Patient agreed to services and verbal consent obtained.   The patient verbalized understanding of instructions, educational materials, and care plan provided today and DECLINED offer to receive copy of patient instructions, educational materials, and care plan.  The pharmacy team will reach out to the patient again over the next 60 days.   Lane Hacker, [place]

## 2022-08-20 NOTE — Progress Notes (Signed)
Chronic Care Management Pharmacy Note  08/20/2022 Name:  Allison White MRN:  [id number] DOB:  06-11-1961  Recommendations: Recommend Vit D and Thyroid due to myalgias on statin (Stopped taking Rosuvastatin     Subjective: [name] is an 61 y.o. year old female who is a primary patient of Henrene Pastor, Zeb Comfort, MD.  The CCM team was consulted for assistance with disease management and care coordination needs.    Engaged with patient face to face for follow up visit in response to provider referral for pharmacy case management and/or care coordination services.   Consent to Services:  The patient was given the following information about Chronic Care Management services today, agreed to services, and gave verbal consent: 1. CCM service includes personalized support from designated clinical staff supervised by the primary care provider, including individualized plan of care and coordination with other care providers 2. 24/7 contact phone numbers for assistance for urgent and routine care needs. 3. Service will only be billed when office clinical staff spend 20 minutes or more in a month to coordinate care. 4. Only one practitioner may furnish and bill the service in a calendar month. 5.The patient may stop CCM services at any time (effective at the end of the month) by phone call to the office staff. 6. The patient will be responsible for cost sharing (co-pay) of up to 20% of the service fee (after annual deductible is met). Patient agreed to services and consent obtained.  Patient Care Team: Lillard Anes, MD as PCP - General (Family Medicine) Constance Haw, MD as PCP - Electrophysiology (Cardiology) Bensimhon, Shaune Pascal, MD as PCP - Advanced Heart Failure (Cardiology) Marice Potter, MD as Consulting Physician (Oncology) Gatha Mayer, MD as Consulting Physician (Radiation Oncology) Lane Hacker, [facility] (Pharmacist)  Recent office visits:  None   Recent consult  visits:  None   Hospital visits:  None   Objective:  Lab Results  Component Value Date   CREATININE 0.90 07/11/2022   BUN 13 07/11/2022   GFR 71.82 07/22/2013   GFRNONAA >60 07/11/2022   GFRAA 70 01/01/2021   NA 137 07/11/2022   K 4.5 07/11/2022   CALCIUM 10.1 07/11/2022   CO2 28 07/11/2022   GLUCOSE 116 (H) 07/11/2022    Lab Results  Component Value Date/Time   HGBA1C 7.3 (H) 12/03/2021 11:05 AM   HGBA1C 6.1 10/25/2020 12:00 AM   GFR 71.82 07/22/2013 03:28 PM    Last diabetic Eye exam:  Lab Results  Component Value Date/Time   HMDIABEYEEXA No Retinopathy 10/25/2020 12:00 AM    Last diabetic Foot exam:  Lab Results  Component Value Date/Time   HMDIABFOOTEX Negative 10/25/2020 12:00 AM     Lab Results  Component Value Date   CHOL 187 03/05/2022   HDL 52 03/05/2022   LDLCALC 91 03/05/2022   TRIG 267 (H) 03/05/2022   CHOLHDL 3.6 03/05/2022       Latest Ref Rng & Units 03/05/2022   10:40 AM 12/03/2021   11:05 AM 11/20/2021   11:00 AM  Hepatic Function  Total Protein 6.0 - 8.5 g/dL 7.7  7.6  7.3   Albumin 3.8 - 4.9 g/dL 4.9  4.7  4.0   AST 0 - 40 IU/L _0 ALT 0 - 32 IU/L _1 Alk Phosphatase 44 - 121 IU/L 92  85  62   Total Bilirubin 0.0 - 1.2 mg/dL 0.6  0.6  0.9     Lab Results  Component Value Date/Time   TSH 2.880 07/17/2020 10:11 AM       Latest Ref Rng & Units 03/05/2022   10:40 AM 02/19/2022   12:00 AM 12/03/2021   11:05 AM  CBC  WBC 3.4 - 10.8 x10E3/uL 8.0  8.2     9.2   Hemoglobin 11.1 - 15.9 g/dL 17.7  16.8     18.0   Hematocrit 34.0 - 46.6 % 50.8  50     51.5   Platelets 150 - 450 x10E3/uL 289  248     261      This result is from an external source.    No results found for: "VD25OH"  Clinical ASCVD: No  The 10-year ASCVD risk score (Arnett DK, et al., 2019) is: 6%   Values used to calculate the score:     Age: 81 years     Sex: Female     Is Non-Hispanic African American: No     Diabetic: Yes     Tobacco smoker:  No     Systolic Blood Pressure: 324 mmHg     Is BP treated: Yes     HDL Cholesterol: 52 mg/dL     Total Cholesterol: 187 mg/dL       11/29/2021    1:35 PM 08/03/2021    9:08 AM 10/26/2020    3:04 PM  Depression screen PHQ 2/9  Decreased Interest 0 0 0  Down, Depressed, Hopeless 0 0 0  PHQ - 2 Score 0 0 0     Other: (CHADS2VASc if Afib, MMRC or CAT for COPD, ACT, DEXA)  Social History   Tobacco Use  Smoking Status Former   Packs/day: 1.00   Years: 6.00   Total pack years: 6.00   Types: Cigarettes  Smokeless Tobacco Never  Tobacco Comments   Quit over 30 years ago   BP Readings from Last 3 Encounters:  07/12/22 108/80  07/11/22 112/70  07/08/22 132/82   Pulse Readings from Last 3 Encounters:  07/12/22 72  07/11/22 69  07/08/22 80   Wt Readings from Last 3 Encounters:  07/12/22 229 lb (103.9 kg)  07/11/22 228 lb 9.6 oz (103.7 kg)  07/08/22 226 lb (102.5 kg)   BMI Readings from Last 3 Encounters:  07/12/22 38.11 kg/m  07/11/22 38.04 kg/m  07/08/22 37.61 kg/m    Assessment/Interventions: Review of patient past medical history, allergies, medications, health status, including review of consultants reports, laboratory and other test data, was performed as part of comprehensive evaluation and provision of chronic care management services.   SDOH:  (Social Determinants of Health) assessments and interventions performed: Yes SDOH Interventions    Flowsheet Row Chronic Care Management from 08/20/2022 in [place] Management from 02/18/2022 in [place] Management from 08/29/2021 in [place] Office Visit from 10/26/2020 in [place] Office Visit from 07/17/2020 in [place]  SDOH Interventions       Transportation Interventions -- Intervention Not Indicated -- -- --  Depression Interventions/Treatment  -- -- -- -- Medication  Financial Strain Interventions Other (Comment)  [PAP] Other (Comment)  Other (Comment)  [PAP of some meds] -- --  Social Connections Interventions -- -- -- Intervention Not Indicated --      SDOH Screenings   Food Insecurity: No Food Insecurity (02/03/2020)  Housing: Low Risk  (07/06/2021)  Transportation Needs: No Transportation Needs (02/18/2022)  Alcohol Screen: Low  Risk  (02/03/2020)  Depression (PHQ2-9): Low Risk  (11/29/2021)  Financial Resource Strain: High Risk (08/20/2022)  Physical Activity: Sufficiently Active (02/03/2020)  Social Connections: Moderately Integrated (10/26/2020)  Stress: No Stress Concern Present (02/03/2020)  Tobacco Use: Medium Risk (07/12/2022)    CCM Care Plan  Allergies  Allergen Reactions   Tape Rash    PREFERS CLOTH OR NOTHING   Plavix [Clopidogrel Bisulfate] Other (See Comments)    Joint pain   Tamoxifen Nausea And Vomiting    Medications Reviewed Today     Reviewed by Lane Hacker, [facility] (Pharmacist) on 08/20/22 at 1055  Med List Status: <None>   Medication Order Taking? Sig Documenting Provider Last Dose Status Informant  aspirin 81 MG EC tablet [id number] No Take 81 mg by mouth daily.  [provider] Taking Active Self  dapagliflozin propanediol (FARXIGA) 10 MG TABS tablet [id number]  Take 1 tablet (10 mg total) by mouth daily. Bensimhon, Shaune Pascal, MD  Active   digoxin (LANOXIN) 0.125 MG tablet [id number] No Take 1 tablet by mouth once daily Bensimhon, Shaune Pascal, MD Taking Active   ezetimibe (ZETIA) 10 MG tablet [id number] No Take 1 tablet (10 mg total) by mouth daily. Lillard Anes, MD Taking Active   HYDROcodone-acetaminophen [facility]) 7.5-325 MG tablet [id number]  Take 1 tablet by mouth every 6 (six) hours as needed for moderate pain. Cox, Kirsten, MD  Active   ivabradine (CORLANOR) 7.5 MG TABS tablet [id number] No Take 1 tablet (7.5 mg total) by mouth 2 (two) times daily with a meal. Rafael Bihari, [place] Taking Active   LORATADINE PO [id number] No Take by mouth daily. [facility] brand [provider] Taking Active Self  metoprolol succinate (TOPROL-XL) 50 MG 24 hr tablet [id number] No Take 1 tablet by mouth twice daily Bensimhon, Shaune Pascal, MD Taking Active   nitroGLYCERIN (NITROSTAT) 0.4 MG SL tablet [id number] No Place 0.4 mg under the tongue every 5 (five) minutes as needed for chest pain. [provider] Taking Active   Phenazopyridine HCl (AZO TABS PO) [id number] No Take 1 capsule by mouth as needed. [provider] Taking Active   raloxifene (EVISTA) 60 MG tablet [id number] No Take 1 tablet (60 mg total) by mouth daily. Marice Potter, MD Taking Active   spironolactone (ALDACTONE) 25 MG tablet [id number] No Take 1 tablet by mouth once daily Lillard Anes, MD Taking Active   torsemide [facility]) 100 MG tablet [id number] No Take 1/2 (one-half) tablet by mouth once daily Bensimhon, Shaune Pascal, MD Taking Active             Patient Active Problem List   Diagnosis Date Noted   BMI 38.0-38.9,adult 03/05/2022   Intraductal carcinoma in situ 12/03/2021   Breast cancer ([place]) 08/03/2021   Chronic migraine without aura, with intractable migraine, so stated, with status migrainosus 01/15/2021   Migraine with aura and without status migrainosus, not intractable 01/15/2021   Chronic pain 04/12/2020   Pacemaker 04/12/2020   Morbid obesity ([place]) 04/10/2018   Myalgia due to statin 09/22/2017   Cardiomyopathy ([place]) 07/31/2017   Presence of automatic (implantable) cardiac defibrillator 02/25/2017   Decreased cardiac ejection fraction 01/23/2017   Fibromuscular dysplasia ([place]) 49/67/5916   Chronic systolic congestive heart failure ([place]) 12/05/2016   Hypertensive heart disease with heart failure ([place]) 12/05/2016   Pleural effusion 12/01/2016   Intractable migraine without aura and with status migrainosus 01/31/2015   Chronic daily headache 07/23/2013   Carotid aneurysm, left ([place]) 07/23/2013  Vertigo    Hyperlipidemia     Immunization History   Administered Date(s) Administered   Influenza Inj Mdck Quad Pf 10/26/2020, 11/29/2021   PFIZER(Purple Top)SARS-COV-2 Vaccination 02/21/2020, 03/20/2020   Pneumococcal Polysaccharide-23 09/08/2019    Conditions to be addressed/monitored:  Hypertension, Hyperlipidemia, Heart Failure, and migraine  Care Plan : [place]  Updates made by Lane Hacker, RPH since 08/20/2022 12:00 AM     Problem: htn, hld, heart failure   Priority: High  Onset Date: 07/06/2021     Long-Range Goal: Disease State Management   Start Date: 07/06/2021  Expected End Date: 07/06/2022  Recent Progress: On track  Priority: High  Note:   Current Barriers:  Unable to independently afford treatment regimen  Pharmacist Clinical Goal(s):  Patient will verbalize ability to afford treatment regimen through collaboration with PharmD and provider.   Interventions: 1:1 collaboration with Lillard Anes, MD regarding development and update of comprehensive plan of care as evidenced by provider attestation and co-signature Inter-disciplinary care team collaboration (see longitudinal plan of care) Comprehensive medication review performed; medication list updated in electronic medical record  (BP goal <130/80) BP Readings from Last 3 Encounters:  07/12/22 108/80  07/11/22 112/70  07/08/22 132/82  -Controlled -Current treatment: Metoprolol succinate 50 mg twice daily Appropriate, Effective, Safe, Accessible Spironolactone 25 mg daily Appropriate, Effective, Safe, Accessible -Medications previously tried: valsartan, Delene Loll -Current home readings:  March 2023: 110/60 October 2023: Doesn't test BP -Current dietary habits: eats small portions but tries to keep healthy diet -Current exercise habits: limited  -Denies hypotensive/hypertensive symptoms -Educated on BP goals and benefits of medications for prevention of heart attack, stroke and kidney damage; Daily salt intake goal < 2300  mg; Exercise goal of 150 minutes per week; -Counseled to monitor BP at home weekly, document, and provide log at future appointments -Recommended to continue current medication  Hyperlipidemia: (LDL goal < 55) The 10-year ASCVD risk score (Arnett DK, et al., 2019) is: 6%   Values used to calculate the score:     Age: 24 years     Sex: Female     Is Non-Hispanic African American: No     Diabetic: Yes     Tobacco smoker: No     Systolic Blood Pressure: 625 mmHg     Is BP treated: Yes     HDL Cholesterol: 52 mg/dL     Total Cholesterol: 187 mg/dL Lab Results  Component Value Date   CHOL 187 03/05/2022   CHOL 181 12/03/2021   CHOL 190 08/03/2021   Lab Results  Component Value Date   HDL 52 03/05/2022   HDL 46 12/03/2021   HDL 45 08/03/2021   Lab Results  Component Value Date   LDLCALC 91 03/05/2022   LDLCALC 93 12/03/2021   LDLCALC 110 (H) 08/03/2021   Lab Results  Component Value Date   TRIG 267 (H) 03/05/2022   TRIG 251 (H) 12/03/2021   TRIG 203 (H) 08/03/2021   Lab Results  Component Value Date   CHOLHDL 3.6 03/05/2022   CHOLHDL 3.9 12/03/2021   CHOLHDL 4.2 08/03/2021  No results found for: "LDLDIRECT" -Not ideally controlled -Current treatment: Aspirin ec 81 mg daily Appropriate, Effective, Safe, Accessible -Medications previously tried: Atorvastatin (Muscle aches), Rosuvastatin (Muscle aches) -Current dietary patterns: little appetite but patient is trying to eat healthy -Current exercise habits: limited -Educated on Cholesterol goals;  Benefits of statin for ASCVD risk reduction; Exercise goal of 150 minutes per week; March 2023: Patient  stopped Rosuvastatin due to muscle aches. Recommend repeat thyroid and Vit D  Heart Failure (Goal: manage symptoms and prevent exacerbations) -Controlled -Last ejection fraction: 25%  (Date: 10/2020) -HF type: Systolic -NYHA Class: III (marked limitation of activity) -Current treatment: Farxiga 10 mg daily  Appropriate, Effective, Safe, Accessible PAP by Cardio Digoxin 0.125 mg daily Appropriate, Effective, Safe, Accessible Ivabradine 7.5 mg bid with a meal Appropriate, Effective, Safe, Accessible PAP by Cardio Metoprolol Succinate 50 mg bid Appropriate, Effective, Safe, Accessible Nitroglcyerin 0.4 mg every 5 minutes prn chest pain Appropriate, Effective, Safe, Accessible Torsemide 50 mg daily Appropriate, Effective, Safe, Accessible Spironolactone 25 mg daily Appropriate, Effective, Safe, Accessible -Medications previously tried: Ship broker, valsartan -Current home BP/HR readings: patient reports bp runs low -Current dietary habits: very little appetite -Current exercise habits: limited  -Educated on Benefits of medications for managing symptoms and prolonging life Importance of weighing daily; if you gain more than 3 pounds in one day or 5 pounds in one week,  -Recommended to continue current medication  Migraine (Goal: prevent/manage migraines) -Not ideally controlled -Current treatment  Hydrocodone 7.5/325 mg every 6 hours prn Query Appropriate, Query effective, Query Safe, Query accessible -Medications previously tried: Ajovy, botox March 2023: Main priority for this patient today was statin, would like to get off CS for headache management in the future October 2023; Patient states Ajovy stopped working so she stopped taking it. Has headaches every day. Asked when the last time she spoke with Neuro was. She states over a year ago. Counseled it'd be a good idea to go back but she doesn't think there's anything they can do so she doesn't want to re-schedule. Could be rebound headaches from Hydrocodone  Breast Cancer -Managed by Lavera Guise -Lumpectomy September 2022 -Current treatment  Raloxifene 49m Appropriate, Effective, Safe, Accessible Started 02/19/22 -Tried/Failed: Tamoxifen 233m([facility] 2022 [facility] mid October 2022 -Recommended to  continue current medication  Patient Goals/Self-Care Activities Patient will:  - take medications as prescribed focus on medication adherence by using pill box target a minimum of 150 minutes of moderate intensity exercise weekly   Follow Up Plan: Telephone follow up appointment with care management team member scheduled for: 6 months  CPP F/U April 2023  [place] ConstablePhSherian Rein. - [phone]        Medication Assistance:  [place] FaWilder Gladebtained through patietn assistance with Cardiologist medication assistance program.  Enrollment ends 11/17/2022. . [name] Compliance/Adherence/Medication fill history:   Care Gaps: Last annual wellness visit?11/29/21   Star Rating Drugs:  Medication:                Last Fill:         Day Supply  None noted   [facility]  [facility] [place]: [phone]: [place] [address] [place]: [phone]: [phone] Uses pill box? Yes Pt endorses good compliance  We discussed: Benefits of medication synchronization, packaging and delivery as well as enhanced pharmacist oversight with [employer]. Patient decided to: Continue current medication management strategy  Care Plan and Follow Up Patient Decision:  Patient agrees to Care Plan and Follow-up.  Plan: Telephone follow up appointment with care management team member scheduled for:  6 months   [place] ConstablePharm.D. - - [phone]

## 2022-08-21 NOTE — Progress Notes (Signed)
[facility]  [address] [place],  [place]  [address] [phone]  Clinic Day:  08/22/2022  Referring physician: Lillard Anes,*  HISTORY OF PRESENT ILLNESS:  The patient is a 61 y.o. 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  She comes in today for routine follow-up.  Since her last visit, the patient has been doing fine.  Of note, she now takes raloxifene as tamoxifen caused significant nausea.  Although she tolerates this agent better, it does cause some musculoskeletal discomfort.  The patient denies having any particular changes in her breasts which concern her for disease recurrence.  Of note, her mammogram in July 2023 showed no evidence of disease recurrence.    She is also followed for polycythemia.  Of note, JAK2 mutation testing came back negative.  She denies having headaches, vision changes, or other hyperviscosity-related symptoms which concern her for complications related to her elevated red cell indices.  PHYSICAL EXAM:  Blood pressure (!) 163/93, pulse 76, temperature 97.8 F (36.6 C), resp. rate 16, height '5\' 5"'$  (1.651 m), weight 222 lb 8 oz (100.9 kg), SpO2 94 %. Wt Readings from Last 3 Encounters:  08/22/22 222 lb 8 oz (100.9 kg)  07/12/22 229 lb (103.9 kg)  07/11/22 228 lb 9.6 oz (103.7 kg)   Body mass index is 37.03 kg/m. Performance status (ECOG): 0 - Asymptomatic Physical Exam Constitutional:      Appearance: Normal appearance.  HENT:     Mouth/Throat:     Pharynx: Oropharynx is clear. No oropharyngeal exudate.  Cardiovascular:     Rate and Rhythm: Normal rate and regular rhythm.     Heart sounds: No murmur heard.    No friction rub. No gallop.  Pulmonary:     Breath sounds: Normal breath sounds.  Chest:  Breasts:    Right: No swelling, bleeding, inverted nipple, mass, nipple discharge or skin change.     Left: No swelling,  bleeding, inverted nipple, mass, nipple discharge or skin change.  Abdominal:     General: Bowel sounds are normal. There is no distension.     Palpations: Abdomen is soft. There is no mass.     Tenderness: There is no abdominal tenderness.  Musculoskeletal:        General: No tenderness.     Cervical back: Normal range of motion and neck supple.     Right lower leg: No edema.     Left lower leg: No edema.  Lymphadenopathy:     Cervical: No cervical adenopathy.     Right cervical: No superficial, deep or posterior cervical adenopathy.    Left cervical: No superficial, deep or posterior cervical adenopathy.     Upper Body:     Right upper body: No supraclavicular or axillary adenopathy.     Left upper body: No supraclavicular or axillary adenopathy.     Lower Body: No right inguinal adenopathy. No left inguinal adenopathy.  Skin:    Coloration: Skin is not jaundiced.     Findings: No lesion or rash.  Neurological:     General: No focal deficit present.     Mental Status: She is alert and oriented to person, place, and time. Mental status is at baseline.  Psychiatric:        Mood and Affect: Mood normal.        Behavior: Behavior normal.        Thought  Content: Thought content normal.        Judgment: Judgment normal.    LABS:    JAK2 GenotypR Comment   Comment: (NOTE)  Result: NEGATIVE for the JAK2 [id number] mutation.     ASSESSMENT & PLAN:  A 61 y.o. 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  She also likely has secondary polycythemia.  Based upon her clinical breast exam today and recent mammogram, the patient remains disease-free.  She knows to continue taking raloxifene for her 5 years of chemoprevention. With respect to her polycythemia, JAK2 mutation testing came back negative.  I believe she has pseudopolycythemia from her chronic diuretic use.  This will continue to be followed conservatively.   Clinically, this patient is doing well.  I will see her back in 6 months for repeat clinical assessment.  The patient understands all the plans discussed today and is in agreement with them.  Zaxton Angerer Macarthur Critchley, MD

## 2022-08-22 ENCOUNTER — Inpatient Hospital Stay: Payer: HMO

## 2022-08-22 ENCOUNTER — Inpatient Hospital Stay: Payer: HMO | Attending: Oncology | Admitting: Oncology

## 2022-08-22 ENCOUNTER — Other Ambulatory Visit: Payer: Self-pay | Admitting: Oncology

## 2022-08-22 VITALS — BP 163/93 | HR 76 | Temp 97.8°F | Resp 16 | Ht 65.0 in | Wt 222.5 lb

## 2022-08-22 DIAGNOSIS — C50919 Malignant neoplasm of unspecified site of unspecified female breast: Secondary | ICD-10-CM | POA: Diagnosis not present

## 2022-08-22 DIAGNOSIS — C50412 Malignant neoplasm of upper-outer quadrant of left female breast: Secondary | ICD-10-CM

## 2022-08-22 DIAGNOSIS — Z170 Estrogen receptor positive status [ER+]: Secondary | ICD-10-CM

## 2022-08-22 DIAGNOSIS — D649 Anemia, unspecified: Secondary | ICD-10-CM | POA: Diagnosis not present

## 2022-08-23 ENCOUNTER — Inpatient Hospital Stay (INDEPENDENT_AMBULATORY_CARE_PROVIDER_SITE_OTHER): Payer: HMO | Admitting: Genetic Counselor

## 2022-08-23 ENCOUNTER — Other Ambulatory Visit: Payer: Self-pay | Admitting: Genetic Counselor

## 2022-08-23 ENCOUNTER — Inpatient Hospital Stay: Payer: HMO

## 2022-08-23 DIAGNOSIS — Z800 Family history of malignant neoplasm of digestive organs: Secondary | ICD-10-CM | POA: Diagnosis not present

## 2022-08-23 DIAGNOSIS — C50412 Malignant neoplasm of upper-outer quadrant of left female breast: Secondary | ICD-10-CM

## 2022-08-23 DIAGNOSIS — Z170 Estrogen receptor positive status [ER+]: Secondary | ICD-10-CM | POA: Diagnosis not present

## 2022-08-23 DIAGNOSIS — Z803 Family history of malignant neoplasm of breast: Secondary | ICD-10-CM | POA: Diagnosis not present

## 2022-08-23 DIAGNOSIS — Z8042 Family history of malignant neoplasm of prostate: Secondary | ICD-10-CM | POA: Diagnosis not present

## 2022-08-23 DIAGNOSIS — Z171 Estrogen receptor negative status [ER-]: Secondary | ICD-10-CM

## 2022-08-26 ENCOUNTER — Encounter: Payer: Self-pay | Admitting: Genetic Counselor

## 2022-08-26 ENCOUNTER — Telehealth: Payer: Self-pay | Admitting: Genetic Counselor

## 2022-08-26 NOTE — Progress Notes (Signed)
REFERRING PROVIDER: Marice Potter, MD [place],  [place] [address]  PRIMARY PROVIDER:  Lillard Anes, MD  PRIMARY REASON FOR VISIT:  1. Malignant neoplasm of upper-outer quadrant of left breast in female, estrogen receptor positive ([place])   2. Family history of breast cancer   3. Family history of prostate cancer   4. Family history of gastric cancer     HISTORY OF PRESENT ILLNESS:   Ms. Cahue, a 61 y.o. female, was seen, along with her mother, for a [place] cancer genetics consultation due to her personal and family history of cancer.  Ms. Walthall presents to clinic today to discuss the possibility of a hereditary predisposition to cancer, to discuss genetic testing, and to further clarify her future cancer risks, as well as potential cancer risks for family members.   In August 2022, at the age of 72, Ms. Diesing was diagnosed with ductal carcinoma in situ (ER+/PR+) with lobular carcinoma in situ s/p lumpectomy.    CANCER HISTORY:  Oncology History  Breast cancer ([place])  07/19/2021 Cancer Staging   Staging form: Breast, AJCC 8th Edition - Pathologic stage from 07/19/2021: Stage 0 (pTis (DCIS), pN0, cM0, ER+, PR+, HER2-) - Signed by Marice Potter, MD on 08/21/2021 Histopathologic type: Intraductal carcinoma, noninfiltrating, NOS Stage prefix: Initial diagnosis Multigene prognostic tests performed: None   08/03/2021 Initial Diagnosis   Breast cancer ([place])      Past Medical History:  Diagnosis Date   Cancer ([place]) 2022   Rt Breast   CHF (congestive heart failure) (HCC)    FMD (facioscapulohumeral muscular dystrophy) ([place])    Headache(784.0)    Heart failure (HCC)    Hyperlipidemia    MI (myocardial infarction) ([place])    Migraine    Nonruptured cerebral aneurysm, internal carotid artery    Left side, stent placement (2009)   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    Vertigo     Past Surgical History:  Procedure Laterality Date   ABDOMINAL  HYSTERECTOMY     BREAST LUMPECTOMY Right 07/2021   CARDIAC DEFIBRILLATOR PLACEMENT     CEREBRAL ANEURYSM REPAIR Left 2009   CESAREAN SECTION     CORONARY ANGIOPLASTY WITH STENT PLACEMENT     PACEMAKER IMPLANT     RIGHT/LEFT HEART CATH AND CORONARY ANGIOGRAPHY N/A 04/07/2020   Procedure: RIGHT/LEFT HEART CATH AND CORONARY ANGIOGRAPHY;  Surgeon: Jolaine Artist, MD;  Location: [facility];  Service: Cardiovascular;  Laterality: N/A;   TUBAL LIGATION      FAMILY HISTORY:  We obtained a detailed, 4-generation family history.  Significant diagnoses are listed below: Family History  Problem Relation Age of Onset   Gastric cancer Mother 57       w/ signet ring features   Prostate cancer Father        d. 10   Breast cancer Maternal Aunt 32   Brain cancer Maternal Aunt 70   Prostate cancer Paternal Uncle        x2 pat uncles   Prostate cancer Maternal Grandfather        metastatic; d. 39   Prostate cancer Paternal Grandfather        metastatic      Ms. Seelig is unaware of previous family history of genetic testing for hereditary cancer risks. Her mother is also proceeding with genetic testing at this time.   Patient's maternal ancestors are of [place] descent, and paternal ancestors are of [place] and Native  American descent. There is reported Ashkenazi Jewish ancestry in her maternal family. There is no known consanguinity.  GENETIC COUNSELING ASSESSMENT: Ms. Pettus is a 61 y.o. female with a personal and family history of which is somewhat suggestive of a hereditary cancer syndrome and predisposition to cancer given the presence of related cancers in the family (breast and prostate) and the presence of Ashkenazi Jewish ancestry. We, therefore, discussed and recommended the following at today's visit.   DISCUSSION: We discussed that 5 - 10% of cancer is hereditary.  Most cases of hereditary breast cancer are associated with mutations in BRCA1/2.  There are other  genes that can be associated with hereditary breast cancer syndromes.  We discussed that testing is beneficial for several reasons including knowing how to follow individuals for their cancer risks and understanding if other family members could be at risk for cancer and allowing them to undergo genetic testing.   We reviewed the characteristics, features and inheritance patterns of hereditary cancer syndromes. We also discussed genetic testing, including the appropriate family members to test, the process of testing, insurance coverage and turn-around-time for results. We discussed the implications of a negative, positive, carrier and/or variant of uncertain significant result. We recommended Ms. Cartaya pursue genetic testing for a panel that includes genes associated with breast, prostate, gastric, and other cancers.   The Multi-Cancer + RNA Panel offered by [facility] includes sequencing and/or deletion/duplication analysis of the following 84 genes:  AIP*, ALK, APC*, ATM*, AXIN2*, BAP1*, BARD1*, BLM*, BMPR1A*, BRCA1*, BRCA2*, BRIP1*, CASR, CDC73*, CDH1*, CDK4, CDKN1B*, CDKN1C*, CDKN2A, CEBPA, CHEK2*, CTNNA1*, DICER1*, DIS3L2*, EGFR, EPCAM, FH*, FLCN*, GATA2*, GPC3, GREM1, HOXB13, HRAS, KIT, MAX*, MEN1*, MET, MITF, MLH1*, MSH2*, MSH3*, MSH6*, MUTYH*, NBN*, NF1*, NF2*, NTHL1*, PALB2*, PDGFRA, PHOX2B, PMS2*, POLD1*, POLE*, POT1*, PRKAR1A*, PTCH1*, PTEN*, RAD50*, RAD51C*, RAD51D*, RB1*, RECQL4, RET, RUNX1*, SDHA*, SDHAF2*, SDHB*, SDHC*, SDHD*, SMAD4*, SMARCA4*, SMARCB1*, SMARCE1*, STK11*, SUFU*, TERC, TERT, TMEM127*, Tp53*, TSC1*, TSC2*, VHL*, WRN*, and WT1.  RNA analysis is performed for * genes.  Based on Ms. Vandermeer personal and family history of cancer, she meets medical criteria for genetic testing. Despite that she meets criteria, she may still have an out of pocket cost. We discussed that if her out of pocket cost for testing is over $100, the laboratory should contact her and discuss the self-pay prices  and/or patient pay assistance programs.    PLAN: After considering the risks, benefits, and limitations, Ms. Cibrian provided informed consent to pursue genetic testing and the blood sample was sent to [facility] for analysis of the Multi-Cancer +RNA Panel. Results should be available within approximately 3 weeks' time, at which point they will be disclosed by telephone to Ms. Pates, as will any additional recommendations warranted by these results. Ms. Hollingworth will receive a summary of her genetic counseling visit and a copy of her results once available. This information will also be available in Epic.    Ms. Zegarra questions were answered to her satisfaction today. Our contact information was provided should additional questions or concerns arise. Thank you for the referral and allowing [place] to share in the care of your patient.   Deshay Blumenfeld M. Joette Catching, [place], [facility] Genetic Counselor [email or web address] (P) [phone]  The patient was seen for a total of 30 minutes in face-to-face genetic counseling.  She was accompanied by her mother. Dr. Hinton Rao was available to discuss this case as needed.   _______________________________________________________________________ For Office Staff:  Number of people involved in session: 2 Was an Intern/ student involved  with case: no

## 2022-08-26 NOTE — Telephone Encounter (Signed)
Patient called asking if she needed pre-approval for genetic testing.  Discussed that lab takes care of insurance process and will notify our team if we need to request prior authorization.  Discussed that she hsould receive call form lab if OOP if over $100.

## 2022-08-29 ENCOUNTER — Telehealth (HOSPITAL_COMMUNITY): Payer: Self-pay | Admitting: Pharmacy Technician

## 2022-08-29 NOTE — Telephone Encounter (Signed)
Advanced Heart Failure Patient Advocate Encounter   Patient was automatically approved to receive Farxiga from [employer] through 11/18/23.  Document scanned to chart. Patient will be notified via mail. Patient currently using [employer] assistance through previous enrollment end date. No further action needed at this time.  Charlann Boxer, CPhT

## 2022-09-06 ENCOUNTER — Telehealth (HOSPITAL_COMMUNITY): Payer: Self-pay | Admitting: *Deleted

## 2022-09-06 NOTE — Telephone Encounter (Signed)
Received fax from [facility], pt sch for colonoscopy on 11/14 and needs clearance to hold ASA starting 11/9  Per Dr Haroldine Laws, this is ok  Form faxed back to them at [phone]

## 2022-09-09 NOTE — Progress Notes (Signed)
Subjective:  Patient ID: Allison White, female    DOB: [birth year]  Age: 61 y.o. MRN: [id number]  Chief Complaint  Patient presents with   Congestive Heart Failure   Hyperlipidemia    HPI: chronic   Patient presents with hyperlipidemia.  Compliance with treatment has been good; patient takes medicines as directed, maintains low cholesterol diet, follows up as directed, and maintains exercise regimen.  Patient is using Zetia 10 mg daily without problems.   CHF: Taking  Torsemide 100 mg 1/2 tablet once a day, spironolactone 25 mg once a day, Digoxin 0.125 mg daily.  On hydrocodone has 5 eft in bottle, no abuse Breast cancer on evista  CHF doing well, stable Current Outpatient Medications on File Prior to Visit  Medication Sig Dispense Refill   aspirin 81 MG EC tablet Take 81 mg by mouth daily.      dapagliflozin propanediol (FARXIGA) 10 MG TABS tablet Take 1 tablet (10 mg total) by mouth daily. 90 tablet 3   digoxin (LANOXIN) 0.125 MG tablet Take 1 tablet by mouth once daily 90 tablet 3   ezetimibe (ZETIA) 10 MG tablet Take 1 tablet (10 mg total) by mouth daily. 90 tablet 3   HYDROcodone-acetaminophen (NORCO) 7.5-325 MG tablet Take 1 tablet by mouth every 6 (six) hours as needed for moderate pain. 90 tablet 0   ivabradine (CORLANOR) 7.5 MG TABS tablet Take 1 tablet (7.5 mg total) by mouth 2 (two) times daily with a meal. 180 tablet 3   LORATADINE PO Take by mouth daily. [facility] brand     metoprolol succinate (TOPROL-XL) 50 MG 24 hr tablet Take 1 tablet by mouth twice daily 180 tablet 3   nitroGLYCERIN (NITROSTAT) 0.4 MG SL tablet Place 0.4 mg under the tongue every 5 (five) minutes as needed for chest pain.     Phenazopyridine HCl (AZO TABS PO) Take 1 capsule by mouth as needed.     raloxifene (EVISTA) 60 MG tablet Take 1 tablet (60 mg total) by mouth daily. 90 tablet 3   spironolactone (ALDACTONE) 25 MG tablet Take 1 tablet by mouth once daily 90 tablet 2   torsemide (DEMADEX) 100  MG tablet Take 1/2 (one-half) tablet by mouth once daily 45 tablet 3   No current facility-administered medications on file prior to visit.   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    Vertigo    Past Surgical History:  Procedure Laterality Date   ABDOMINAL HYSTERECTOMY     BREAST LUMPECTOMY Right 07/2021   CARDIAC DEFIBRILLATOR PLACEMENT     CEREBRAL ANEURYSM REPAIR Left 2009   CESAREAN SECTION     CORONARY ANGIOPLASTY WITH STENT PLACEMENT     PACEMAKER IMPLANT     RIGHT/LEFT HEART CATH AND CORONARY ANGIOGRAPHY N/A 04/07/2020   Procedure: RIGHT/LEFT HEART CATH AND CORONARY ANGIOGRAPHY;  Surgeon: Jolaine Artist, MD;  Location: [facility];  Service: Cardiovascular;  Laterality: N/A;   TUBAL LIGATION      Family History  Problem Relation Age of Onset   Thyroid disease Mother  Gastric cancer Mother 49       w/ signet ring features   Prostate cancer Father        d. 36   Hypertension Brother    Breast cancer Maternal Aunt 42   Brain cancer Maternal Aunt 70   Prostate cancer Paternal Uncle        x2 pat uncles   Prostate cancer Maternal Grandfather        metastatic; d. 28   Cerebral aneurysm Paternal Grandmother        Nonruptured   Dementia Paternal Grandfather    Prostate cancer Paternal Grandfather        metastatic   Social History   Socioeconomic History   Marital status: Married    Spouse name: Tim   Number of children: 4   Years of education: Not on file   Highest education level: High school graduate  Occupational History   Not on file  Tobacco Use   Smoking status: Former     Packs/day: 1.00    Years: 6.00    Total pack years: 6.00    Types: Cigarettes   Smokeless tobacco: Never   Tobacco comments:    Quit over 30 years ago  Vaping Use   Vaping Use: Never used  Substance and Sexual Activity   Alcohol use: Yes    Comment: Occasionally "little"   Drug use: No   Sexual activity: Not Currently  Other Topics Concern   Not on file  Social History Narrative   Lives with husband in a one-story home.     Right handed   Caffeine: "very little"   Social Determinants of Health   Financial Resource Strain: High Risk (08/20/2022)   Overall Financial Resource Strain (CARDIA)    Difficulty of Paying Living Expenses: Hard  Food Insecurity: No Food Insecurity (02/03/2020)   Hunger Vital Sign    Worried About Running Out of Food in the Last Year: Never true    Ran Out of Food in the Last Year: Never true  Transportation Needs: No Transportation Needs (02/18/2022)   PRAPARE - Hydrologist (Medical): No    Lack of Transportation (Non-Medical): No  Physical Activity: Sufficiently Active (02/03/2020)   Exercise Vital Sign    Days of Exercise per Week: 4 days    Minutes of Exercise per Session: 90 min  Stress: No Stress Concern Present (02/03/2020)   [place]    Feeling of Stress : Not at all  Social Connections: Moderately Integrated (10/26/2020)   Social Connection and Isolation Panel [NHANES]    Frequency of Communication with Friends and Family: More than three times a week    Frequency of Social Gatherings with Friends and Family: Twice a week    Attends Religious Services: 1 to 4 times per year    Active Member of [employer] or Organizations: No    Attends Archivist Meetings: Never    Marital Status: Married    Review of Systems  Constitutional:  Negative for chills, fatigue and fever.  HENT:  Negative for congestion, ear pain and sore throat.   Eyes:   Negative for visual disturbance.  Respiratory:  Negative for cough and shortness of breath.   Cardiovascular:  Negative for chest pain and palpitations.  Gastrointestinal:  Negative for abdominal pain, constipation, diarrhea, nausea and vomiting.  Endocrine: Negative for polydipsia, polyphagia and polyuria.  Genitourinary:  Negative for difficulty urinating and  dysuria.  Musculoskeletal:  Negative for arthralgias, back pain and myalgias.  Skin:  Negative for rash.  Neurological:  Negative for headaches.  Psychiatric/Behavioral:  Negative for dysphoric mood. The patient is not nervous/anxious.      Objective:  BP 110/70   Pulse 78   Temp (!) 97.5 F (36.4 C)   Resp 14   Ht '5\' 5"'$  (1.651 m)   Wt 225 lb (102.1 kg)   SpO2 95%   BMI 37.44 kg/m      09/10/2022    8:05 AM 08/22/2022    2:23 PM 07/12/2022    8:45 AM  BP/Weight  Systolic BP 341 962 229  Diastolic BP 70 93 80  Wt. (Lbs) 225 222.5 229  BMI 37.44 kg/m2 37.03 kg/m2 38.11 kg/m2    Physical Exam Vitals reviewed.  Constitutional:      General: She is not in acute distress.    Appearance: Normal appearance. She is obese.  HENT:     Head: Normocephalic.     Right Ear: Tympanic membrane normal.     Nose: Nose normal.     Mouth/Throat:     Mouth: Mucous membranes are moist.     Pharynx: Oropharynx is clear.  Eyes:     Extraocular Movements: Extraocular movements intact.     Conjunctiva/sclera: Conjunctivae normal.     Pupils: Pupils are equal, round, and reactive to light.  Cardiovascular:     Rate and Rhythm: Normal rate and regular rhythm.     Pulses: Normal pulses.     Heart sounds: No murmur heard.    No gallop.  Pulmonary:     Effort: Pulmonary effort is normal. No respiratory distress.     Breath sounds: No wheezing.  Abdominal:     General: Abdomen is flat. Bowel sounds are normal.     Palpations: Abdomen is soft.     Tenderness: There is no abdominal tenderness.  Musculoskeletal:        General:  Normal range of motion.     Cervical back: Normal range of motion.     Right lower leg: No edema.     Left lower leg: No edema.  Skin:    General: Skin is warm.     Capillary Refill: Capillary refill takes less than 2 seconds.  Neurological:     General: No focal deficit present.     Mental Status: She is alert and oriented to person, place, and time. Mental status is at baseline.     Gait: Gait normal.     Deep Tendon Reflexes: Reflexes normal.  Psychiatric:        Mood and Affect: Mood normal.        Thought Content: Thought content normal.         Lab Results  Component Value Date   WBC 8.0 03/05/2022   HGB 17.7 (H) 03/05/2022   HCT 50.8 (H) 03/05/2022   PLT 289 03/05/2022   GLUCOSE 116 (H) 07/11/2022   CHOL 187 03/05/2022   TRIG 267 (H) 03/05/2022   HDL 52 03/05/2022   LDLCALC 91 03/05/2022   ALT 19 03/05/2022   AST 21 03/05/2022   NA 137 07/11/2022   K 4.5 07/11/2022   CL 100 07/11/2022   CREATININE 0.90 07/11/2022   BUN 13 07/11/2022   CO2 28 07/11/2022   TSH 2.880 07/17/2020   INR 1.0 04/04/2020   HGBA1C 7.3 (H) 12/03/2021      Assessment & Plan:   Problem List  Items Addressed This Visit       Cardiovascular and Mediastinum   Chronic systolic congestive heart failure ([place]) - Primary (Chronic) An individualized care plan was established and reinforced.  The patient's disease status was assessed using clinical finding son exam today, labs, and/or other diagnostic testing such as x-rays, to determine the patient's success in meeting treatmentgoalsbased on disease-based guidelines and found to be improving. But not at goal yet. Medications prescriptions no changes Laboratory tests ordered to be performed today include normal labs. RECOMMENDATIONS: given include see cardiology.  Call physician is patient gains 3 lbs in one day or 5 lbs for one week.  Call for progressive PND, orthopnea or increased pedal edema.     Fibromuscular dysplasia (HCC) Chronic  pain with fibromuscular dysplasia       Migraine Migraines are stable     Other   Morbid obesity (HCC) (Chronic) Patient has BMI 37 with hyperlipidemia and CHF    Hyperlipidemia   Relevant Orders   Lipid panel AN INDIVIDUAL CARE PLAN for hyperlipidemia/ cholesterol was established and reinforced today.  The patient's status was assessed using clinical findings on exam, lab and other diagnostic tests. The patient's disease status was assessed based on evidence-based guidelines and found to be fair controlled. MEDICATIONS were reviewed. SELF MANAGEMENT GOALS have been discussed and patient's success at attaining the goal of low cholesterol was assessed. RECOMMENDATION given include regular exercise 3 days a week and low cholesterol/low fat diet. CLINICAL SUMMARY including written plan to identify barriers unique to the patient due to social or economic  reasons was discussed.     Pacemaker Pacemaker stable    Breast cancer ([place]) Breast cancer under control    BMI 37.0-37.9, adult An individualize plan was formulated for obesity using patient history and physical exam to encourage weight loss.  An evidence based program was formulated.  Patient is to cut portion size with meals and to plan physical exercise 3 days a week at least 20 minutes.  Weight watchers and other programs are helpful.  Planned amount of weight loss 10 lbs.    Opioid use Pain medicines used for fibromuscular dysplasia pain  .    Orders Placed This Encounter  Procedures   Lipid panel   CBC with Differential/Platelet   Comprehensive metabolic panel     Follow-up: Return in about 6 months (around 03/12/2023).  An After Visit Summary was printed and given to the patient.  Reinaldo Meeker, MD [facility] [phone]

## 2022-09-10 ENCOUNTER — Encounter: Payer: Self-pay | Admitting: Legal Medicine

## 2022-09-10 ENCOUNTER — Ambulatory Visit (INDEPENDENT_AMBULATORY_CARE_PROVIDER_SITE_OTHER): Payer: HMO | Admitting: Legal Medicine

## 2022-09-10 VITALS — BP 110/70 | HR 78 | Temp 97.5°F | Resp 14 | Ht 65.0 in | Wt 225.0 lb

## 2022-09-10 DIAGNOSIS — Z6837 Body mass index (BMI) 37.0-37.9, adult: Secondary | ICD-10-CM

## 2022-09-10 DIAGNOSIS — Z950 Presence of cardiac pacemaker: Secondary | ICD-10-CM

## 2022-09-10 DIAGNOSIS — F1190 Opioid use, unspecified, uncomplicated: Secondary | ICD-10-CM | POA: Insufficient documentation

## 2022-09-10 DIAGNOSIS — E782 Mixed hyperlipidemia: Secondary | ICD-10-CM

## 2022-09-10 DIAGNOSIS — G43109 Migraine with aura, not intractable, without status migrainosus: Secondary | ICD-10-CM | POA: Diagnosis not present

## 2022-09-10 DIAGNOSIS — C50412 Malignant neoplasm of upper-outer quadrant of left female breast: Secondary | ICD-10-CM

## 2022-09-10 DIAGNOSIS — I5022 Chronic systolic (congestive) heart failure: Secondary | ICD-10-CM

## 2022-09-10 DIAGNOSIS — I773 Arterial fibromuscular dysplasia: Secondary | ICD-10-CM

## 2022-09-10 DIAGNOSIS — Z170 Estrogen receptor positive status [ER+]: Secondary | ICD-10-CM

## 2022-09-10 DIAGNOSIS — I110 Hypertensive heart disease with heart failure: Secondary | ICD-10-CM

## 2022-09-11 LAB — COMPREHENSIVE METABOLIC PANEL
ALT: 20 IU/L (ref 0–32)
AST: 19 IU/L (ref 0–40)
Albumin/Globulin Ratio: 1.6 (ref 1.2–2.2)
Albumin: 4.2 g/dL (ref 3.8–4.9)
Alkaline Phosphatase: 88 IU/L (ref 44–121)
BUN/Creatinine Ratio: 20 (ref 12–28)
BUN: 19 mg/dL (ref 8–27)
Bilirubin Total: 0.5 mg/dL (ref 0.0–1.2)
CO2: 29 mmol/L (ref 20–29)
Calcium: 10.3 mg/dL (ref 8.7–10.3)
Chloride: 90 mmol/L — ABNORMAL LOW (ref 96–106)
Creatinine, Ser: 0.94 mg/dL (ref 0.57–1.00)
Globulin, Total: 2.6 g/dL (ref 1.5–4.5)
Glucose: 145 mg/dL — ABNORMAL HIGH (ref 70–99)
Potassium: 3.9 mmol/L (ref 3.5–5.2)
Sodium: 136 mmol/L (ref 134–144)
Total Protein: 6.8 g/dL (ref 6.0–8.5)
eGFR: 69 mL/min/{1.73_m2} (ref >59)

## 2022-09-11 LAB — LIPID PANEL
Chol/HDL Ratio: 4.9 ratio — ABNORMAL HIGH (ref 0.0–4.4)
Cholesterol, Total: 234 mg/dL — ABNORMAL HIGH (ref 100–199)
HDL: 48 mg/dL (ref >39)
LDL Chol Calc (NIH): 133 mg/dL — ABNORMAL HIGH (ref 0–99)
Triglycerides: 295 mg/dL — ABNORMAL HIGH (ref 0–149)
VLDL Cholesterol Cal: 53 mg/dL — ABNORMAL HIGH (ref 5–40)

## 2022-09-11 LAB — CBC WITH DIFFERENTIAL/PLATELET
Basophils Absolute: 0.1 10*3/uL (ref 0.0–0.2)
Basos: 1 %
EOS (ABSOLUTE): 0.3 10*3/uL (ref 0.0–0.4)
Eos: 3 %
Hematocrit: 48.7 % — ABNORMAL HIGH (ref 34.0–46.6)
Hemoglobin: 17 g/dL — ABNORMAL HIGH (ref 11.1–15.9)
Immature Grans (Abs): 0 10*3/uL (ref 0.0–0.1)
Immature Granulocytes: 0 %
Lymphocytes Absolute: 1.9 10*3/uL (ref 0.7–3.1)
Lymphs: 24 %
MCH: 31.3 pg (ref 26.6–33.0)
MCHC: 34.9 g/dL (ref 31.5–35.7)
MCV: 90 fL (ref 79–97)
Monocytes Absolute: 0.7 10*3/uL (ref 0.1–0.9)
Monocytes: 10 %
Neutrophils Absolute: 4.7 10*3/uL (ref 1.4–7.0)
Neutrophils: 62 %
Platelets: 251 10*3/uL (ref 150–450)
RBC: 5.44 x10E6/uL — ABNORMAL HIGH (ref 3.77–5.28)
RDW: 12.2 % (ref 11.7–15.4)
WBC: 7.6 10*3/uL (ref 3.4–10.8)

## 2022-09-11 LAB — CARDIOVASCULAR RISK ASSESSMENT

## 2022-09-11 NOTE — Progress Notes (Signed)
Triglycerides 295 very high, LDL cholesterol high 133- needs cholesterol control- needs injectable cholesterol medicine, hemoglobin and hematocrit high have oncologist  look at this, glucose 145, add A1c, kidney tests normal, Liver tests normal lp

## 2022-09-12 ENCOUNTER — Ambulatory Visit (INDEPENDENT_AMBULATORY_CARE_PROVIDER_SITE_OTHER): Payer: HMO

## 2022-09-12 DIAGNOSIS — I420 Dilated cardiomyopathy: Secondary | ICD-10-CM

## 2022-09-13 ENCOUNTER — Encounter: Payer: Self-pay | Admitting: Genetic Counselor

## 2022-09-13 ENCOUNTER — Other Ambulatory Visit: Payer: Self-pay

## 2022-09-13 ENCOUNTER — Telehealth: Payer: Self-pay | Admitting: Genetic Counselor

## 2022-09-13 DIAGNOSIS — I773 Arterial fibromuscular dysplasia: Secondary | ICD-10-CM

## 2022-09-13 DIAGNOSIS — Z1501 Genetic susceptibility to malignant neoplasm of breast: Secondary | ICD-10-CM | POA: Insufficient documentation

## 2022-09-13 DIAGNOSIS — G894 Chronic pain syndrome: Secondary | ICD-10-CM

## 2022-09-13 DIAGNOSIS — Z1379 Encounter for other screening for genetic and chromosomal anomalies: Secondary | ICD-10-CM | POA: Insufficient documentation

## 2022-09-13 LAB — CUP PACEART REMOTE DEVICE CHECK
Date Time Interrogation Session: 20231027072350
Implantable Lead Connection Status: 753985
Implantable Lead Connection Status: 753985
Implantable Lead Implant Date: 20180410
Implantable Lead Implant Date: 20180410
Implantable Lead Location: 753859
Implantable Lead Location: 753860
Implantable Lead Model: 377
Implantable Lead Model: 402266
Implantable Lead Serial Number: 49794726
Implantable Lead Serial Number: 49838890
Implantable Pulse Generator Implant Date: 20180410
Pulse Gen Model: 404622
Pulse Gen Serial Number: 60982098

## 2022-09-13 NOTE — Telephone Encounter (Signed)
Contacted patient in attempt to disclose results of genetic testing.  LVM with contact information requesting a call back.  

## 2022-09-14 ENCOUNTER — Other Ambulatory Visit: Payer: Self-pay | Admitting: Legal Medicine

## 2022-09-14 DIAGNOSIS — E782 Mixed hyperlipidemia: Secondary | ICD-10-CM

## 2022-09-14 MED ORDER — PRALUENT 150 MG/ML ~~LOC~~ SOAJ: 150.0000 mg | SUBCUTANEOUS | 6 refills | Status: DC

## 2022-09-15 ENCOUNTER — Other Ambulatory Visit: Payer: Self-pay | Admitting: Family Medicine

## 2022-09-15 DIAGNOSIS — I773 Arterial fibromuscular dysplasia: Secondary | ICD-10-CM

## 2022-09-15 DIAGNOSIS — G894 Chronic pain syndrome: Secondary | ICD-10-CM

## 2022-09-16 ENCOUNTER — Encounter: Payer: Self-pay | Admitting: Legal Medicine

## 2022-09-16 LAB — HGB A1C W/O EAG: Hgb A1c MFr Bld: 6.7 % — ABNORMAL HIGH (ref 4.8–5.6)

## 2022-09-16 LAB — SPECIMEN STATUS REPORT

## 2022-09-16 MED ORDER — HYDROCODONE-ACETAMINOPHEN 7.5-325 MG PO TABS: 1.0000 | ORAL_TABLET | Freq: Four times a day (QID) | ORAL | 0 refills | Status: DC | PRN

## 2022-09-17 DIAGNOSIS — I110 Hypertensive heart disease with heart failure: Secondary | ICD-10-CM | POA: Diagnosis not present

## 2022-09-17 DIAGNOSIS — I5020 Unspecified systolic (congestive) heart failure: Secondary | ICD-10-CM | POA: Diagnosis not present

## 2022-09-17 DIAGNOSIS — E785 Hyperlipidemia, unspecified: Secondary | ICD-10-CM

## 2022-09-17 NOTE — Progress Notes (Signed)
A1c 6.7 lp

## 2022-09-17 NOTE — Telephone Encounter (Signed)
Sent in 09/16/2022 by Dr. Tobie Poet lp

## 2022-09-18 ENCOUNTER — Other Ambulatory Visit (HOSPITAL_COMMUNITY): Payer: Self-pay | Admitting: *Deleted

## 2022-09-18 MED ORDER — IVABRADINE HCL 7.5 MG PO TABS: 7.5000 mg | ORAL_TABLET | Freq: Two times a day (BID) | ORAL | 3 refills | Status: DC

## 2022-09-18 NOTE — Telephone Encounter (Signed)
Disclosed low penetrance CHEK2 mutation.  Discussed very slight increase in breast cancer risk (no management changes indicated) and increased chance of colonoscopies (recommended cscope every 5 years).  Recommended genetic testing for her children, brother, and maternal relatives.   Patient has first colonoscopy scheduled in November.

## 2022-09-19 ENCOUNTER — Other Ambulatory Visit (HOSPITAL_COMMUNITY): Payer: Self-pay | Admitting: *Deleted

## 2022-09-19 MED ORDER — IVABRADINE HCL 7.5 MG PO TABS: 7.5000 mg | ORAL_TABLET | Freq: Two times a day (BID) | ORAL | 3 refills | Status: DC

## 2022-09-19 NOTE — Telephone Encounter (Signed)
Pt need refill for corlanor sent to [facility] fax [phone] or call [phone]

## 2022-09-23 ENCOUNTER — Other Ambulatory Visit (HOSPITAL_COMMUNITY): Payer: Self-pay | Admitting: *Deleted

## 2022-09-23 MED ORDER — IVABRADINE HCL 7.5 MG PO TABS: 7.5000 mg | ORAL_TABLET | Freq: Two times a day (BID) | ORAL | 3 refills | Status: DC

## 2022-09-24 NOTE — Progress Notes (Signed)
Remote ICD transmission.   

## 2022-09-25 ENCOUNTER — Encounter: Payer: Self-pay | Admitting: Genetic Counselor

## 2022-09-25 ENCOUNTER — Ambulatory Visit: Payer: Self-pay | Admitting: Genetic Counselor

## 2022-09-25 DIAGNOSIS — Z1379 Encounter for other screening for genetic and chromosomal anomalies: Secondary | ICD-10-CM

## 2022-09-25 DIAGNOSIS — Z170 Estrogen receptor positive status [ER+]: Secondary | ICD-10-CM

## 2022-09-25 DIAGNOSIS — Z1501 Genetic susceptibility to malignant neoplasm of breast: Secondary | ICD-10-CM

## 2022-09-25 NOTE — Progress Notes (Addendum)
GENETIC TEST RESULTS   Patient Name: Allison White Patient Age: 61 y.o. Encounter Date: 09/25/2022  Allison White was seen in the [place] clinic on August 23, 2022 due to a personal and family history of cancer and concern regarding a hereditary predisposition to cancer in the family. Please refer to the prior Genetics clinic note for more information regarding Allison White medical and family histories and our assessment at the time.     FAMILY HISTORY:  We obtained a detailed, 4-generation family history.  Significant diagnoses are listed below:      Family History  Problem Relation Age of Onset   Gastric cancer Mother 70        w/ signet ring features   Prostate cancer Father          d. 32   Breast cancer Maternal Aunt 64   Brain cancer Maternal Aunt 70   Prostate cancer Paternal Uncle          x2 pat uncles   Prostate cancer Maternal Grandfather          metastatic; d. 54   Prostate cancer Paternal Grandfather          metastatic         Allison White is unaware of previous family history of genetic testing for hereditary cancer risks. Her mother is also proceeding with genetic testing at this time.    Patient's maternal ancestors are of [place] descent, and paternal ancestors are of [place] American and Native American descent. There is reported Ashkenazi Jewish ancestry in her maternal family. There is no known consanguinity.      GENETIC TESTING:   A single, low penetrance pathogenic variant was detected in the CHEK2 gene called c.470T>C (p.Ile157Thr).   No other pathogenic variants detected in [facility] Multi-Cancer +RNA Panel.  Report date is 09/11/2022.    The Multi-Cancer + RNA Panel offered by [facility] includes sequencing and/or deletion/duplication analysis of the following 84 genes:  AIP*, ALK, APC*, ATM*, AXIN2*, BAP1*, BARD1*, BLM*, BMPR1A*, BRCA1*, BRCA2*, BRIP1*, CASR, CDC73*, CDH1*, CDK4, CDKN1B*, CDKN1C*, CDKN2A, CEBPA, CHEK2*, CTNNA1*, DICER1*, DIS3L2*,  EGFR, EPCAM, FH*, FLCN*, GATA2*, GPC3, GREM1, HOXB13, HRAS, KIT, MAX*, MEN1*, MET, MITF, MLH1*, MSH2*, MSH3*, MSH6*, MUTYH*, NBN*, NF1*, NF2*, NTHL1*, PALB2*, PDGFRA, PHOX2B, PMS2*, POLD1*, POLE*, POT1*, PRKAR1A*, PTCH1*, PTEN*, RAD50*, RAD51C*, RAD51D*, RB1*, RECQL4, RET, RUNX1*, SDHA*, SDHAF2*, SDHB*, SDHC*, SDHD*, SMAD4*, SMARCA4*, SMARCB1*, SMARCE1*, STK11*, SUFU*, TERC, TERT, TMEM127*, Tp53*, TSC1*, TSC2*, VHL*, WRN*, and WT1.  RNA analysis is performed for * genes.      CHEK2 CANCER RISKS & RECOMMENDATIONS:  We discussed that CHEK2 mutations have been found to be associated with an increased risk of breast, colon, prostate, and possibly other cancers. It is important to distinguish that Allison White specific mutation (c.470T>C) has been shown to confer a risk for breast cancer that is less than the breast cancer risk for other mutations in the CHEK2 gene. Thus, her mutation is classified as a low penetrance mutation. Studies in [place] of this low penetrance mutation (c.470T>C) estimated the lifetime risk for breast cancer among carriers to be increased by about 50% over the general population risk.  For those of average breast cancer risk, this low penetrance mutation in isolation is not expected to increase one's breast cancer risk >20%. The risk for contralateral breast cancer has not been well studied in women with the c.470T>C mutation.   Both men and women with a CHEK2 mutation may have an increased risk of colon  cancer, and men have a possible increased prostate cancer risk. Precise lifetime risk estimates are not established. There is preliminary evidence supporting a correlation with CHEK2 and autosomal dominant predisposition to other cancer types; however, the available evidence is insufficient to make a determination regarding these relationships.   Cancer risks in families with the same CHEK2 mutation can vary widely, suggesting that there are likely to be other factors involved that we  don't understand yet that also contribute to the cancer risks associated with CHEK2 mutations. An individual's cancer risk and medical management are not determined by genetic test results alone. Overall cancer risk assessment incorporates additional factors, including personal medical history, family history, and any available genetic information that may result in a personalized plan for cancer prevention and surveillance.    According to the [place] [phone]) and ACMG (2023; PMID: [id number]).  Breast Cancer Management:   Low penetrance variants in CHEK2 are not clinically actionable in isolation for breast cancer risk.  Breast surveillance should be based on personalized assessment including family history as a minimum   Colon Cancer Management:  Personal history of colon cancer: Manage based on personal history. Do not have a personal history of colon cancer but have a parent/sibling/child with colon cancer:  Colonoscopy every 5 years starting at age 58 or 46 years younger than the earliest age of onset, whichever is younger. Do not have a personal history of colon cancer and do not have a parent/sibling/child with colon cancer:  Colonoscopy every 5 years starting at age 41.  Per ACMG, colon cancer screening should be based on family history and not a CHEK2 c.470T>C mutation in isolation.  Thus, it is reasonable for some individuals with CHEK2 low penetrance variants to opt for less aggressive screening based on shared decision making with their gastroenterologist.   Prostate Cancer Management:  There is emerging evidence associated with pathogenic variants in CHEK2 and prostate cancer. Specific prostate cancer risk associated with c.470T>C variants is unclear at this time.  Consider prostate cancer screening beginning at age 38  Implications for Family Members: Hereditary predisposition to cancer due to pathogenic variants in the CHEK2 gene has autosomal dominant  inheritance. This means that an individual with a pathogenic variant has a 50% chance of passing the condition on to his/her offspring. Identification of a pathogenic variant allows for the recognition of at-risk relatives who can pursue testing for the familial variant.   Family members are encouraged to consider genetic testing for this familial pathogenic variant. As there are generally no childhood cancer risks associated with pathogenic variants in the CHEK2 gene, individuals in the family are not recommended to have testing until they reach at least 60 years of age. Complimentary testing for the familial variant is available for 90 days. They may contact our office at [phone] for more information or to schedule an appointment. Family members who live outside of the area are encouraged to find a genetic counselor in their area by visiting: [email or web address].   Resources: FORCE (Facing Our Risk of Cancer Empowered) is a resource for those with a hereditary predisposition to develop cancer.  FORCE provides information about risk reduction, advocacy, legislation, and clinical trials.  Additionally, FORCE provides a platform for collaboration and support; which includes: peer navigation, message boards, local support groups, a toll-free helpline, research registry and recruitment, advocate training, published medical research, webinars, brochures, mastectomy photos, and more.  For more information, visit www.facingourrisk.org  Plan:  No changes in breast cancer screening  are indicated based on this result in isolation.  She should consider colonoscopies every 5 years.  Her first colonoscopy is scheduled for later this month with Dr. Lyda Jester through [facility]  GI office contacted about result.  Family letter provided to encourage family testing.  Ms. Chhim mother also has the family CHEK2 variant; thus, maternal relatives should have appropriate genetic  counseling.   Our contact number was provided. Ms. Hohn questions were answered to her satisfaction, and she knows she is welcome to call us at anytime with additional questions or concerns.   Bennette Hasty M. Joette Catching, [place], [facility] Genetic Counselor [email or web address] (P) [phone]

## 2022-09-27 DIAGNOSIS — I2510 Atherosclerotic heart disease of native coronary artery without angina pectoris: Secondary | ICD-10-CM | POA: Insufficient documentation

## 2022-10-01 LAB — HM COLONOSCOPY

## 2022-10-15 ENCOUNTER — Telehealth (HOSPITAL_COMMUNITY): Payer: Self-pay | Admitting: Pharmacy Technician

## 2022-10-15 ENCOUNTER — Other Ambulatory Visit (HOSPITAL_COMMUNITY): Payer: Self-pay | Admitting: *Deleted

## 2022-10-15 ENCOUNTER — Other Ambulatory Visit (HOSPITAL_COMMUNITY): Payer: Self-pay

## 2022-10-15 MED ORDER — IVABRADINE HCL 7.5 MG PO TABS
7.5000 mg | ORAL_TABLET | Freq: Two times a day (BID) | ORAL | 3 refills | Status: DC
  Filled 2022-10-15 – 2022-11-20 (×3): qty 180, 90d supply, fill #0
  Filled 2023-02-23: qty 180, 90d supply, fill #1
  Filled 2023-05-26: qty 180, 90d supply, fill #2
  Filled 2023-08-21: qty 180, 90d supply, fill #3

## 2022-10-15 NOTE — Telephone Encounter (Signed)
Advanced Heart Failure Patient Advocate Encounter  Patient called and left vm requesting getting a refill of Corlanor from [facility]. Called and spoke with [facility], the representative confirm that the RX was received and that the patient would need to call in and request a refill.   Called and spoke with the patient. Provided phone number to [facility]. Also talked about renewal for 2024. Instead of reapplying through [facility], was able to get a grant to help cover the cost of Corlanor. Patient would like it mailed to her from [facility]. Will add billing information to [facility]. Patient aware to start using [facility] after using up what's left of [facility] shipment. Sent 30 day RX request to [place] ([place]) to send to [facility] [employer] [facility].    Charlann Boxer, CPhT

## 2022-10-15 NOTE — Telephone Encounter (Signed)
Advanced Heart Failure Patient Advocate Encounter  The patient was approved for a [employer] grant that will help cover the cost of Corlanor. Total amount awarded, $10,000. Eligibility, 09/15/22 - 09/15/23.  ID [id number]  BIN [id number]  PCN [id number]  Group [id number]  Emailed grant information to patient. Added billing information to [place], CPhT

## 2022-10-18 ENCOUNTER — Telehealth: Payer: Self-pay

## 2022-10-18 NOTE — Progress Notes (Signed)
Chronic Care Management Pharmacy Assistant   Name: Allison White  MRN: [id number] DOB: October 14, 1961   Reason for Encounter: Disease State call for HTN   Recent office visits:  09/14/22 Reinaldo Meeker MD. Orders Only. Started on Praluent '150mg'$ .  09/10/22 Reinaldo Meeker MD. Seen for routine visit. No med changes.  Recent consult visits:  08/22/22 (Oncology) Lavera Guise MD. Seen for follow up. No med changes.  Hospital visits:  None  Medications: Outpatient Encounter Medications as of 10/18/2022  Medication Sig   Alirocumab (PRALUENT) 150 MG/ML SOAJ Inject 150 mg into the skin every 14 (fourteen) days.   aspirin 81 MG EC tablet Take 81 mg by mouth daily.    dapagliflozin propanediol (FARXIGA) 10 MG TABS tablet Take 1 tablet (10 mg total) by mouth daily.   digoxin (LANOXIN) 0.125 MG tablet Take 1 tablet by mouth once daily   ezetimibe (ZETIA) 10 MG tablet Take 1 tablet (10 mg total) by mouth daily.   HYDROcodone-acetaminophen (NORCO) 7.5-325 MG tablet Take 1 tablet by mouth every 6 (six) hours as needed for moderate pain.   ivabradine (CORLANOR) 7.5 MG TABS tablet Take 1 tablet (7.5 mg total) by mouth 2 (two) times daily with a meal.   LORATADINE PO Take by mouth daily. [facility] brand   metoprolol succinate (TOPROL-XL) 50 MG 24 hr tablet Take 1 tablet by mouth twice daily   nitroGLYCERIN (NITROSTAT) 0.4 MG SL tablet Place 0.4 mg under the tongue every 5 (five) minutes as needed for chest pain.   Phenazopyridine HCl (AZO TABS PO) Take 1 capsule by mouth as needed.   raloxifene (EVISTA) 60 MG tablet Take 1 tablet (60 mg total) by mouth daily.   spironolactone (ALDACTONE) 25 MG tablet Take 1 tablet by mouth once daily   torsemide (DEMADEX) 100 MG tablet Take 1/2 (one-half) tablet by mouth once daily   No facility-administered encounter medications on file as of 10/18/2022.    Recent Office Vitals: BP Readings from Last 3 Encounters:  09/10/22 110/70  08/22/22 (!) 163/93   07/12/22 108/80   Pulse Readings from Last 3 Encounters:  09/10/22 78  08/22/22 76  07/12/22 72    Wt Readings from Last 3 Encounters:  09/10/22 225 lb (102.1 kg)  08/22/22 222 lb 8 oz (100.9 kg)  07/12/22 229 lb (103.9 kg)     Kidney Function Lab Results  Component Value Date/Time   CREATININE 0.94 09/10/2022 08:45 AM   CREATININE 0.90 07/11/2022 03:03 PM   GFR 71.82 07/22/2013 03:28 PM   GFRNONAA >60 07/11/2022 03:03 PM   GFRAA 70 01/01/2021 10:39 AM       Latest Ref Rng & Units 09/10/2022    8:45 AM 07/11/2022    3:03 PM 03/05/2022   10:40 AM  BMP  Glucose 70 - 99 mg/dL 145  116  131   BUN 8 - 27 mg/dL '19  13  20   '$ Creatinine 0.57 - 1.00 mg/dL 0.94  0.90  0.99   BUN/Creat Ratio 12 - '28 20   20   '$ Sodium 134 - 144 mmol/L 136  137  142   Potassium 3.5 - 5.2 mmol/L 3.9  4.5  4.4   Chloride 96 - 106 mmol/L 90  100  98   CO2 20 - 29 mmol/L '29  28  27   '$ Calcium 8.7 - 10.3 mg/dL 10.3  10.1  10.8      Current antihypertensive regimen:  Digoxin 0.'125mg'$  daily  Spironolactone '25mg'$  daily  Ivabradine 7.'5mg'$  two times daily  Torsemide '100mg'$  1/5 daily  Patient verbally confirms she is taking the above medications as directed. Yes  Do you receive your medications through PAP? Yes  If Yes, Ivabradine 7.'5mg'$  what is the status? Getting a grant through [place] received? Delivery set to 10/22/22  How often are you checking your Blood Pressure? 1-2x per week  she checks her blood pressure in the morning, in the afternoon, and in the evening before/after taking her medication.  Current home BP readings: The last week 120/70, 130/80  Wrist or arm cuff: Wrist  Caffeine intake:1/2 to 1 cup of coffee  Salt intake:Limited  Any readings above 180/120? No  What recent interventions/DTPs have been made by any provider to improve Blood Pressure control since last CPP Visit: No changes   Any recent hospitalizations or ED visits since last visit with CPP? No  What diet changes  have been made to improve Blood Pressure Control?  Limiting potatoes, bread and sugar   What exercise is being done to improve your Blood Pressure Control?  Pt is not getting a lot of exercise due to cold weather   Adherence Review: Is the patient currently on ACE/ARB medication? No Does the patient have >5 day gap between last estimated fill dates? CPP to review  Care Gaps: Last annual wellness visit? 11/29/21  Star Rating Drugs:  Medication:  Last Fill: Day Supply None noted   Elray Mcgregor, [place] Pharmacist Assistant  [phone]

## 2022-11-03 IMAGING — MG MM BREAST BX W/ LOC DEV 1ST LESION IMAGE BX SPEC STEREO GUIDE*R*
8 of 13 series · 8 of 29 positions shown · non-contrast
Comparison: Previous exams.
COMPARISON: Previous exams.

Addendum:
CLINICAL DATA: Right asymmetry with calcifications.

EXAM:
RIGHT BREAST STEREOTACTIC CORE NEEDLE BIOPSY

[R (1 of 8)]
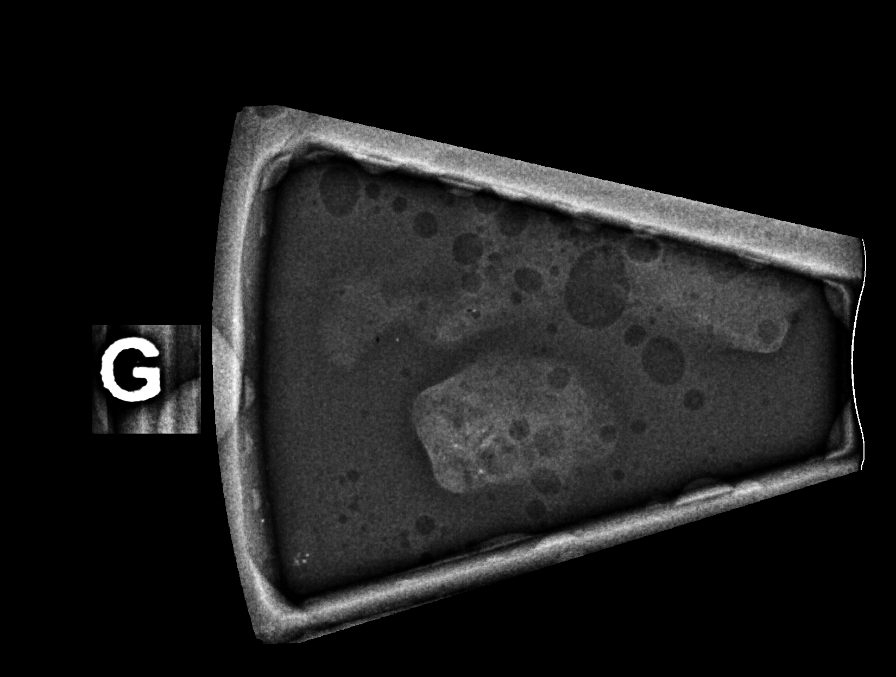

[R (2 of 8)]
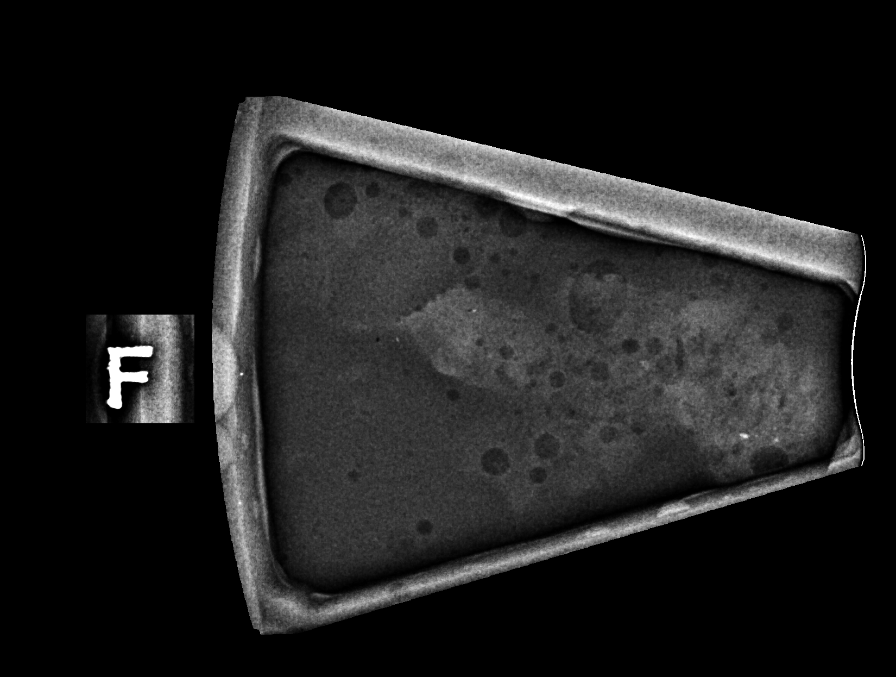

[R (3 of 8)]
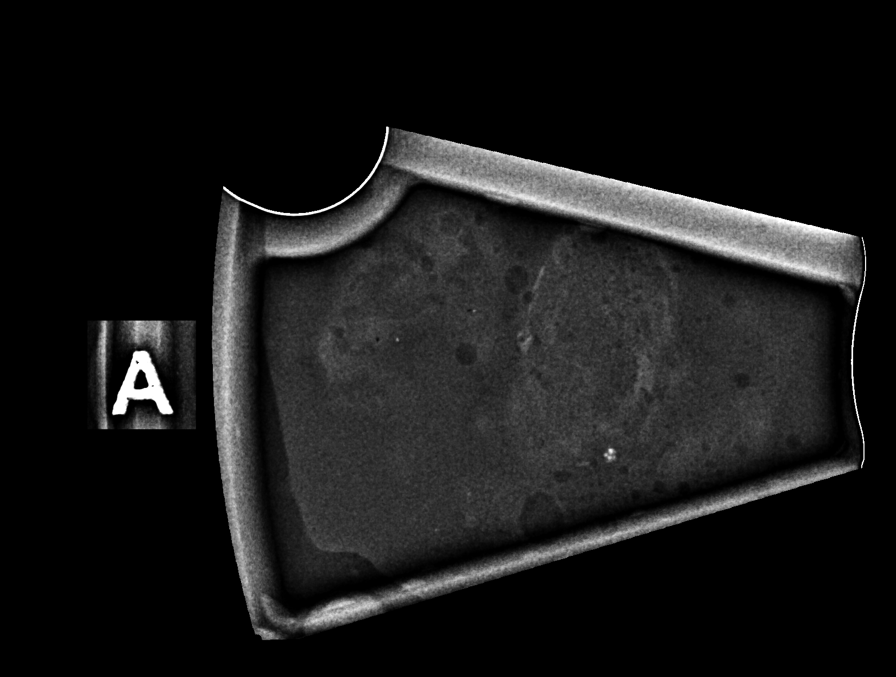

[R (4 of 8)]
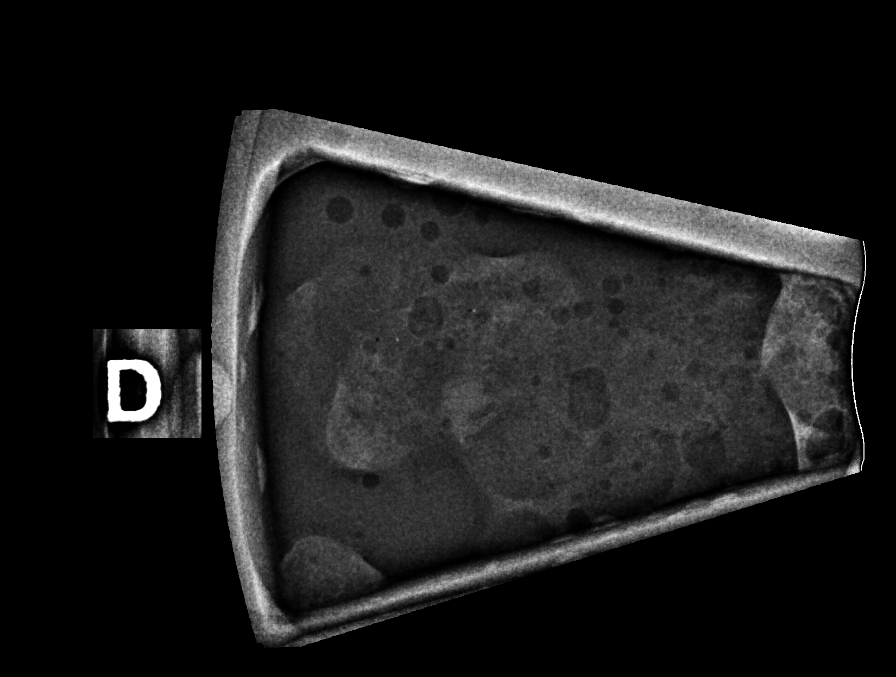

[R (5 of 8)]
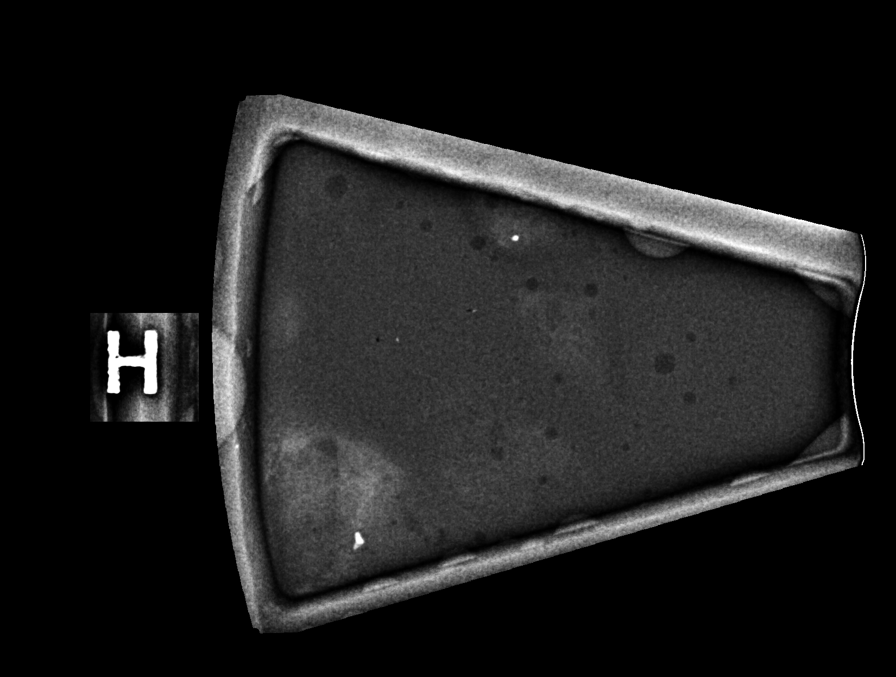

[R (6 of 8)]
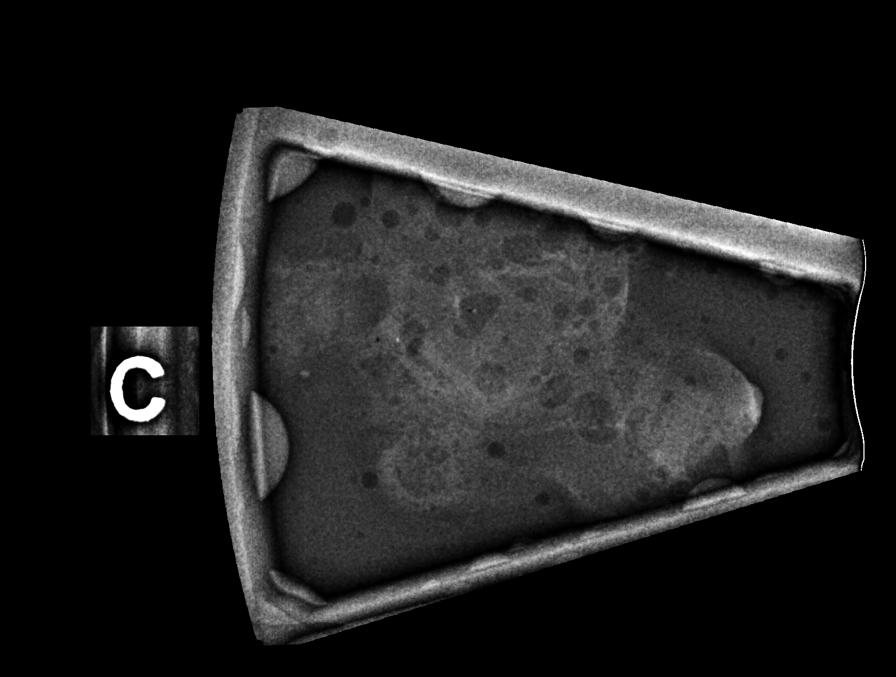

[R (7 of 8)]
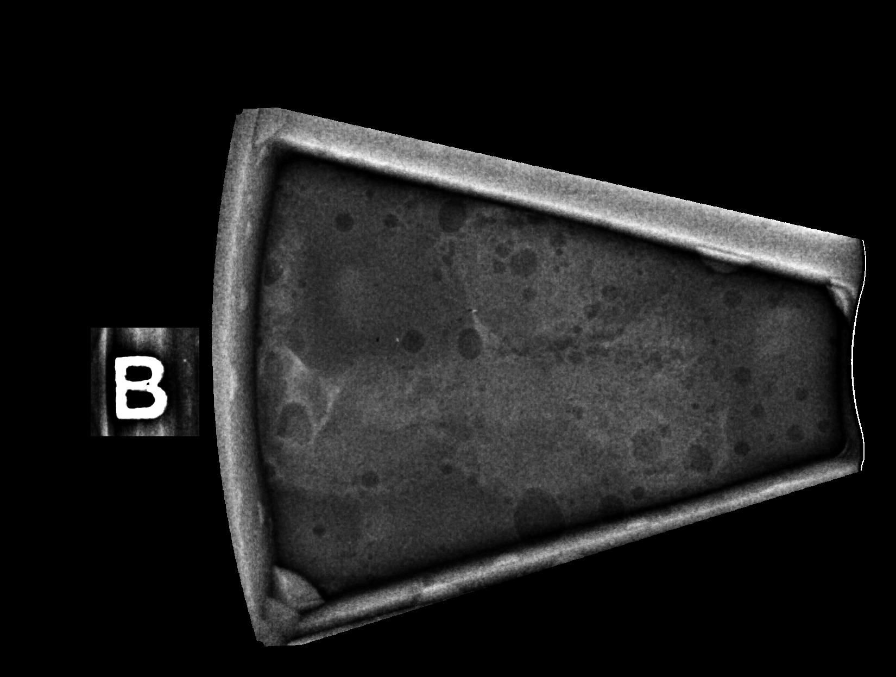

[R (8 of 8)]
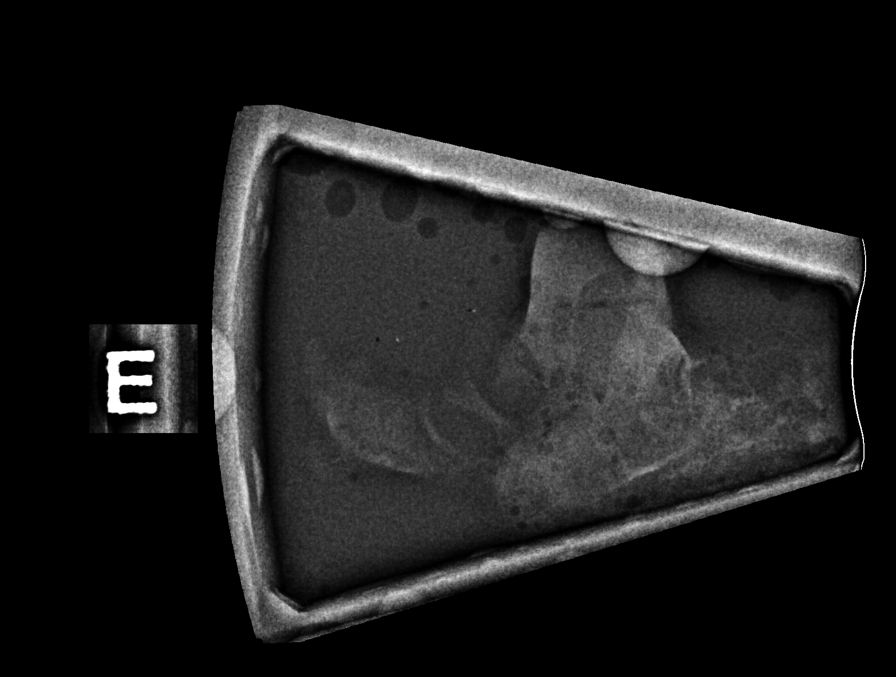

[8 of 29 positions shown; findings below may reference images not displayed]



Using sterile technique and 1% lidocaine and 2% lidocaine with
epinephrine as local anesthetic, under stereotactic guidance, a 9
gauge vacuum assisted device was used to perform core needle biopsy
of calcifications in the lateral aspect of the right breast using a
superior to inferior approach. Specimen radiograph was performed
showing calcifications are present in the tissue samples. Specimens
with calcifications are identified for pathology.

Lesion quadrant: Lateral

At the conclusion of the procedure, coil shaped tissue marker clip
was deployed into the biopsy cavity. Follow-up 2-view mammogram was
performed and dictated separately.
IMPRESSION: Stereotactic-guided biopsy of the right breast. No apparent
complications.

ADDENDUM:
Pathology revealed LOW GRADE DUCTAL CARCINOMA IN SITU, LOBULAR
NEOPLASIA (LOBULAR CARCINOMA IN SITU of the RIGHT breast, lateral,
(coil clip). This was found to be concordant by Dr. Topo Gilman.

Pathology results were discussed with the patient by telephone. The
patient reported doing well after the biopsy with tenderness at the
site. Post biopsy instructions and care were reviewed and questions
were answered. The patient was encouraged to call The [REDACTED]

Per patient request, surgical referral made to [HOSPITAL] area with
Dr. Paul Aime Viang of [REDACTED].

Pathology results reported by Kennedi Wash RN on 06/21/2021.



Using sterile technique and 1% lidocaine and 2% lidocaine with
epinephrine as local anesthetic, under stereotactic guidance, a 9
gauge vacuum assisted device was used to perform core needle biopsy
of calcifications in the lateral aspect of the right breast using a
superior to inferior approach. Specimen radiograph was performed
showing calcifications are present in the tissue samples. Specimens
with calcifications are identified for pathology.

Lesion quadrant: Lateral

At the conclusion of the procedure, coil shaped tissue marker clip
was deployed into the biopsy cavity. Follow-up 2-view mammogram was
performed and dictated separately.
IMPRESSION: Stereotactic-guided biopsy of the right breast. No apparent
complications.

## 2022-11-20 ENCOUNTER — Other Ambulatory Visit: Payer: Self-pay | Admitting: Internal Medicine

## 2022-11-20 ENCOUNTER — Other Ambulatory Visit (HOSPITAL_COMMUNITY): Payer: Self-pay

## 2022-12-09 ENCOUNTER — Other Ambulatory Visit: Payer: Self-pay | Admitting: Family Medicine

## 2022-12-09 DIAGNOSIS — I773 Arterial fibromuscular dysplasia: Secondary | ICD-10-CM

## 2022-12-09 DIAGNOSIS — G894 Chronic pain syndrome: Secondary | ICD-10-CM

## 2022-12-09 MED ORDER — HYDROCODONE-ACETAMINOPHEN 7.5-325 MG PO TABS
1.0000 | ORAL_TABLET | Freq: Four times a day (QID) | ORAL | 0 refills | Status: DC | PRN
Start: 2022-12-09 — End: 2022-12-24

## 2022-12-12 ENCOUNTER — Ambulatory Visit (INDEPENDENT_AMBULATORY_CARE_PROVIDER_SITE_OTHER): Payer: PPO

## 2022-12-12 DIAGNOSIS — I420 Dilated cardiomyopathy: Secondary | ICD-10-CM

## 2022-12-12 LAB — CUP PACEART REMOTE DEVICE CHECK
Battery Remaining Percentage: 51 %
Battery Voltage: 3.09 V
Brady Statistic RA Percent Paced: 71 %
Brady Statistic RV Percent Paced: 1 %
Date Time Interrogation Session: 20240125091550
HighPow Impedance: 109 Ohm
Implantable Lead Connection Status: 753985
Implantable Lead Connection Status: 753985
Implantable Lead Implant Date: 20180410
Implantable Lead Implant Date: 20180410
Implantable Lead Location: 753859
Implantable Lead Location: 753860
Implantable Lead Model: 377
Implantable Lead Model: 402266
Implantable Lead Serial Number: 49794726
Implantable Lead Serial Number: 49838890
Implantable Pulse Generator Implant Date: 20180410
Lead Channel Impedance Value: 536 Ohm
Lead Channel Impedance Value: 623 Ohm
Lead Channel Pacing Threshold Amplitude: 0.6 V
Lead Channel Pacing Threshold Amplitude: 0.6 V
Lead Channel Pacing Threshold Pulse Width: 0.4 ms
Lead Channel Pacing Threshold Pulse Width: 0.4 ms
Lead Channel Sensing Intrinsic Amplitude: 17.1 mV
Lead Channel Setting Pacing Amplitude: 2 V
Lead Channel Setting Pacing Amplitude: 2.5 V
Lead Channel Setting Pacing Pulse Width: 0.4 ms
Lead Channel Setting Sensing Sensitivity: 0.8 mV
Pulse Gen Model: 404622
Pulse Gen Serial Number: 60982098

## 2022-12-24 ENCOUNTER — Ambulatory Visit (INDEPENDENT_AMBULATORY_CARE_PROVIDER_SITE_OTHER): Payer: PPO | Admitting: Family Medicine

## 2022-12-24 ENCOUNTER — Telehealth: Payer: Self-pay

## 2022-12-24 ENCOUNTER — Encounter: Payer: Self-pay | Admitting: Family Medicine

## 2022-12-24 VITALS — BP 118/68 | HR 67 | Temp 96.4°F | Ht 65.0 in | Wt 219.0 lb

## 2022-12-24 DIAGNOSIS — I5022 Chronic systolic (congestive) heart failure: Secondary | ICD-10-CM

## 2022-12-24 DIAGNOSIS — C50412 Malignant neoplasm of upper-outer quadrant of left female breast: Secondary | ICD-10-CM | POA: Diagnosis not present

## 2022-12-24 DIAGNOSIS — R519 Headache, unspecified: Secondary | ICD-10-CM | POA: Diagnosis not present

## 2022-12-24 DIAGNOSIS — K219 Gastro-esophageal reflux disease without esophagitis: Secondary | ICD-10-CM | POA: Diagnosis not present

## 2022-12-24 DIAGNOSIS — Z6836 Body mass index (BMI) 36.0-36.9, adult: Secondary | ICD-10-CM

## 2022-12-24 DIAGNOSIS — I25119 Atherosclerotic heart disease of native coronary artery with unspecified angina pectoris: Secondary | ICD-10-CM

## 2022-12-24 DIAGNOSIS — E782 Mixed hyperlipidemia: Secondary | ICD-10-CM

## 2022-12-24 DIAGNOSIS — G894 Chronic pain syndrome: Secondary | ICD-10-CM

## 2022-12-24 DIAGNOSIS — Z23 Encounter for immunization: Secondary | ICD-10-CM

## 2022-12-24 DIAGNOSIS — Z170 Estrogen receptor positive status [ER+]: Secondary | ICD-10-CM

## 2022-12-24 DIAGNOSIS — Z95810 Presence of automatic (implantable) cardiac defibrillator: Secondary | ICD-10-CM

## 2022-12-24 DIAGNOSIS — E1159 Type 2 diabetes mellitus with other circulatory complications: Secondary | ICD-10-CM

## 2022-12-24 DIAGNOSIS — Z950 Presence of cardiac pacemaker: Secondary | ICD-10-CM | POA: Diagnosis not present

## 2022-12-24 DIAGNOSIS — E1169 Type 2 diabetes mellitus with other specified complication: Secondary | ICD-10-CM

## 2022-12-24 DIAGNOSIS — I773 Arterial fibromuscular dysplasia: Secondary | ICD-10-CM | POA: Diagnosis not present

## 2022-12-24 DIAGNOSIS — I152 Hypertension secondary to endocrine disorders: Secondary | ICD-10-CM

## 2022-12-24 DIAGNOSIS — M791 Myalgia, unspecified site: Secondary | ICD-10-CM | POA: Diagnosis not present

## 2022-12-24 DIAGNOSIS — T466X5A Adverse effect of antihyperlipidemic and antiarteriosclerotic drugs, initial encounter: Secondary | ICD-10-CM

## 2022-12-24 MED ORDER — OMEPRAZOLE 20 MG PO CPDR: 20.0000 mg | DELAYED_RELEASE_CAPSULE | Freq: Every day | ORAL | 1 refills | Status: DC

## 2022-12-24 NOTE — Progress Notes (Cosign Needed)
Error

## 2022-12-24 NOTE — Progress Notes (Signed)
Subjective:  Patient ID: [name], female    DOB: 10/30/1961  Age: 62 y.o. MRN: [id number]  Chief Complaint  Patient presents with   Hyperlipidemia   Hypertension   Congestive Heart Failure    HPI Diabetes: continue farxiga 10 mg daily.   History of carotid artery dissection. Will be having echo this month. Followed by [facility] and Dr. Curt Bears. Has been seen at the [facility] clinic. Hx of breast cancer-lumpectomy-see's Dr. Bobby Rumpf. Has seen Neurology and Neurosurgeon. Pseudoaneurysm.   CHF: taking torsemide 100 mg 1/2 tablet daily, spironolactone 25 mg daily, Digoxin 0.125 mg daily, ivabradine 7.5 g twice daily, farxiga 10 mg daily  Hyperlipidemia: currently taking zetia 10 mg daily and maintaining a low cholesterol diet. Intolerant to statins/praluent.  Hypertension: taking metoprolol XL 50 mg BID  Headaches- takes vicodin as needed  Breast cancer: on evista.   GERD: on omeprazole 20 g daily.      12/24/2022   11:15 AM 11/29/2021    1:35 PM 08/03/2021    9:08 AM 10/26/2020    3:04 PM 07/17/2020   10:28 AM  Depression screen PHQ 2/9  Decreased Interest 0 0 0 0 0  Down, Depressed, Hopeless 0 0 0 0 0  PHQ - 2 Score 0 0 0 0 0  Altered sleeping     2  Tired, decreased energy     3  Change in appetite     3  Trouble concentrating     0  Moving slowly or fidgety/restless     0  Suicidal thoughts     0  PHQ-9 Score     8  Difficult doing work/chores     Very difficult         12/02/2021    1:34 PM 02/19/2022    2:12 PM 07/12/2022    8:49 AM 08/22/2022    2:23 PM 12/24/2022   11:15 AM  [place] in the past year? 1  0  0  Was there an injury with Fall? 0  0  0  Fall Risk Category Calculator 2  0  0  Fall Risk Category (Retired) Moderate  Low    (RETIRED) Patient Fall Risk Level Low fall risk Low fall risk Low fall risk Low fall risk   Patient at Risk for Falls Due to History of fall(s)  No Fall Risks  No Fall Risks  Fall risk Follow up Falls evaluation  completed;Falls prevention discussed  Falls evaluation completed  Falls evaluation completed      Review of Systems  Constitutional:  Negative for chills, fatigue and fever.  HENT:  Negative for congestion, ear pain, rhinorrhea and sore throat.   Respiratory:  Positive for shortness of breath. Negative for cough.   Cardiovascular:  Negative for chest pain.  Gastrointestinal:  Negative for abdominal pain, constipation, diarrhea, nausea and vomiting.  Endocrine: Positive for polydipsia and polyuria. Negative for polyphagia.  Genitourinary:  Negative for dysuria and urgency.  Musculoskeletal:  Negative for back pain and myalgias.  Neurological:  Positive for headaches. Negative for dizziness, weakness and light-headedness.  Psychiatric/Behavioral:  Negative for dysphoric mood. The patient is not nervous/anxious.     Current Outpatient Medications on File Prior to Visit  Medication Sig Dispense Refill   aspirin 81 MG EC tablet Take 81 mg by mouth daily.      dapagliflozin propanediol (FARXIGA) 10 MG TABS tablet Take 1 tablet (10 mg total) by mouth daily. 90 tablet 3   digoxin (  LANOXIN) 0.125 MG tablet Take 1 tablet by mouth once daily 90 tablet 0   ezetimibe (ZETIA) 10 MG tablet Take 1 tablet (10 mg total) by mouth daily. 90 tablet 3   ivabradine (CORLANOR) 7.5 MG TABS tablet Take 1 tablet (7.5 mg total) by mouth 2 (two) times daily with a meal. 180 tablet 3   LORATADINE PO Take by mouth daily. [facility] brand     metoprolol succinate (TOPROL-XL) 50 MG 24 hr tablet Take 1 tablet by mouth twice daily 180 tablet 3   nitroGLYCERIN (NITROSTAT) 0.4 MG SL tablet Place 0.4 mg under the tongue every 5 (five) minutes as needed for chest pain.     Phenazopyridine HCl (AZO TABS PO) Take 1 capsule by mouth as needed.     raloxifene (EVISTA) 60 MG tablet Take 1 tablet (60 mg total) by mouth daily. 90 tablet 3   spironolactone (ALDACTONE) 25 MG tablet Take 1 tablet by mouth once daily 90 tablet 2    torsemide (DEMADEX) 100 MG tablet Take 1/2 (one-half) tablet by mouth once daily 45 tablet 3   No current facility-administered medications on file prior to visit.   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    Vertigo    Past Surgical History:  Procedure Laterality Date   ABDOMINAL HYSTERECTOMY     BREAST LUMPECTOMY Right 07/2021   CARDIAC DEFIBRILLATOR PLACEMENT     CEREBRAL ANEURYSM REPAIR Left 2009   CESAREAN SECTION     CORONARY ANGIOPLASTY WITH STENT PLACEMENT     PACEMAKER IMPLANT     RIGHT/LEFT HEART CATH AND CORONARY ANGIOGRAPHY N/A 04/07/2020   Procedure: RIGHT/LEFT HEART CATH AND CORONARY ANGIOGRAPHY;  Surgeon: Jolaine Artist, MD;  Location: [facility];  Service: Cardiovascular;  Laterality: N/A;   TUBAL LIGATION      Family History  Problem Relation Age of Onset   Thyroid disease Mother    Gastric cancer Mother 71       w/ signet ring features   Prostate cancer Father        d. 11   Hypertension Brother    Breast cancer Maternal Aunt 11   Brain cancer Maternal Aunt 70   Prostate cancer Paternal Uncle        x2 pat uncles   Prostate cancer Maternal Grandfather        metastatic; d. 27   Cerebral aneurysm Paternal Grandmother        Nonruptured   Dementia Paternal Grandfather    Prostate cancer Paternal Grandfather        metastatic   Social History   Socioeconomic History   Marital status: Married    Spouse name: Tim   Number of children: 4   Years of education: Not on file   Highest education level: High school graduate  Occupational History   Not on file  Tobacco Use    Smoking status: Former    Packs/day: 1.00    Years: 6.00    Total pack years: 6.00    Types: Cigarettes   Smokeless  tobacco: Never   Tobacco comments:    Quit over 30 years ago  Vaping Use   Vaping Use: Never used  Substance and Sexual Activity   Alcohol use: Yes    Comment: Occasionally "little"   Drug use: No   Sexual activity: Not Currently  Other Topics Concern   Not on file  Social History Narrative   Lives with husband in a one-story home.     Right handed   Caffeine: "very little"   Social Determinants of Health   Financial Resource Strain: High Risk (08/20/2022)   Overall Financial Resource Strain (CARDIA)    Difficulty of Paying Living Expenses: Hard  Food Insecurity: No Food Insecurity (02/03/2020)   Hunger Vital Sign    Worried About Running Out of Food in the Last Year: Never true    Ran Out of Food in the Last Year: Never true  Transportation Needs: No Transportation Needs (02/18/2022)   PRAPARE - Hydrologist (Medical): No    Lack of Transportation (Non-Medical): No  Physical Activity: Sufficiently Active (02/03/2020)   Exercise Vital Sign    Days of Exercise per Week: 4 days    Minutes of Exercise per Session: 90 min  Stress: No Stress Concern Present (02/03/2020)   [place]    Feeling of Stress : Not at all  Social Connections: Moderately Integrated (10/26/2020)   Social Connection and Isolation Panel [NHANES]    Frequency of Communication with Friends and Family: More than three times a week    Frequency of Social Gatherings with Friends and Family: Twice a week    Attends Religious Services: 1 to 4 times per year    Active Member of [employer] or Organizations: No    Attends Music therapist: Never    Marital Status: Married    Objective:  BP 118/68   Pulse 67   Temp (!) 96.4 F (35.8 C)   Ht 5' 5"$  (1.651 m)   Wt 219 lb (99.3 kg)   SpO2 98%    BMI 36.44 kg/m      12/24/2022   11:11 AM 09/10/2022    8:05 AM 08/22/2022    2:23 PM  BP/Weight  Systolic BP [id number]  Diastolic BP 68 70 93  Wt. (Lbs) 219 225 222.5  BMI 36.44 kg/m2 37.44 kg/m2 37.03 kg/m2    Physical Exam Vitals reviewed.  Constitutional:      Appearance: Normal appearance. She is normal weight.  Neck:     Vascular: No carotid bruit.  Cardiovascular:     Rate and Rhythm: Normal rate and regular rhythm.     Heart sounds: Normal heart sounds.  Pulmonary:     Effort: Pulmonary effort is normal. No respiratory distress.     Breath sounds: Normal breath sounds.  Abdominal:     General: Abdomen is flat. Bowel sounds are normal.     Palpations: Abdomen is soft.     Tenderness: There is no abdominal tenderness.  Musculoskeletal:     Right lower leg: Edema present.     Left lower leg: Edema present.  Neurological:     Mental Status: She is alert and oriented to person, place, and time.  Psychiatric:        Mood and Affect: Mood normal.        Behavior: Behavior normal.     Diabetic Foot Exam - Simple   Simple Foot Form  12/24/2022  3:14 PM  Visual Inspection No deformities, no ulcerations, no other skin breakdown bilaterally: Yes Sensation Testing Intact to touch and monofilament testing bilaterally: Yes Pulse Check Posterior Tibialis and Dorsalis pulse intact bilaterally: Yes Comments      Lab Results  Component Value Date   WBC 7.5 12/24/2022   HGB 17.8 (H) 12/24/2022   HCT 50.5 (H) 12/24/2022   PLT 287 12/24/2022   GLUCOSE 136 (H) 12/24/2022   CHOL 228 (H) 12/24/2022   TRIG 383 (H) 12/24/2022   HDL 51 12/24/2022   LDLCALC 111 (H) 12/24/2022   ALT 24 12/24/2022   AST 24 12/24/2022   NA 138 12/24/2022   K 3.6 12/24/2022   CL 88 (L) 12/24/2022   CREATININE 0.94 12/24/2022   BUN 15 12/24/2022   CO2 31 (H) 12/24/2022   TSH 3.720 12/24/2022   INR 1.0 04/04/2020   HGBA1C 7.0 (H) 12/24/2022      Assessment & Plan:    Mixed  hyperlipidemia Assessment & Plan: Well controlled.  No changes to medicines. taking zetia 10 mg daily Continue to work on eating a healthy diet and exercise.  Labs drawn today.    Orders: -     Lipid panel -     TSH -     Cardiovascular Risk Assessment  Chronic systolic congestive heart failure (HCC) Assessment & Plan: Management for specialist. Continue metoprolol xl 50 mg twice daily, torsemide 100 mg 1/2 tablet daily, spironolactone 25 mg daily, Digoxin 0.125 mg daily, ivabradine 7.5 g twice daily, farxiga 10 mg daily. Upcoming echo scheduled by specialist.   Orders: -     Comprehensive metabolic panel  Fibromuscular dysplasia ([place]) Assessment & Plan: Management per specialist.  Orders: -     HYDROcodone-Acetaminophen; Take 1 tablet by mouth every 6 (six) hours as needed for moderate pain.  Dispense: 30 tablet; Refill: 0  Hypertension associated with diabetes ([place]) Assessment & Plan: Control: good Recommend check sugars fasting daily Recommend check feet daily. Recommend annual eye exams. Medicines: continue farxiga 10 mg daily  Continue to work on eating a healthy diet and exercise.  Labs drawn today.     Orders: -     CBC with Differential/Platelet -     Hemoglobin A1c -     Microalbumin / creatinine urine ratio  Encounter for immunization -     Flu Vaccine MDCK QUAD PF  Malignant neoplasm of upper-outer quadrant of left breast in female, estrogen receptor positive ([place]) Assessment & Plan: Continue evista.  Management per specialist.    Coronary artery disease involving native coronary artery of native heart with angina pectoris [facility]) Assessment & Plan: Continue aspirin 81 mg daily.  Continue zetia 10 mg daily, toprol xl 50 mg twice daily    Class 2 severe obesity due to excess calories with serious comorbidity and body mass index (BMI) of 36.0 to 36.9 in adult [facility]) Assessment & Plan: Recommend continue to work on eating healthy diet and  exercise.    Myalgia due to statin Assessment & Plan: Intolerant to statins.    Pacemaker Assessment & Plan: Management by cardiology.   Presence of automatic (implantable) cardiac defibrillator Assessment & Plan: Management by cardiology.   Gastroesophageal reflux disease without esophagitis Assessment & Plan: Continue omperazole 20 mg daily   Orders: -     Omeprazole; Take 1 capsule (20 mg total) by mouth daily.  Dispense: 90 capsule; Refill: 1  Chronic daily headache Assessment & Plan: Uses hydrocodone/apap sparingly.  Meds ordered this encounter  Medications   omeprazole (PRILOSEC) 20 MG capsule    Sig: Take 1 capsule (20 mg total) by mouth daily.    Dispense:  90 capsule    Refill:  1   HYDROcodone-acetaminophen (NORCO) 7.5-325 MG tablet    Sig: Take 1 tablet by mouth every 6 (six) hours as needed for moderate pain.    Dispense:  30 tablet    Refill:  0    Patient needs to discuss this with her oncologist    Orders Placed This Encounter  Procedures   Flu Vaccine MDCK QUAD PF   CBC with Differential/Platelet   Comprehensive metabolic panel   Hemoglobin A1c   Lipid panel   Microalbumin / creatinine urine ratio   TSH   Cardiovascular Risk Assessment   HM COLONOSCOPY     Follow-up: Return in about 3 months (around 03/24/2023) for Chronic.  An After Visit Summary was printed and given to the patient.   Geralynn Ochs I Leal-Borjas,acting as a scribe for Rochel Brome, MD.,have documented all relevant documentation on the behalf of Rochel Brome, MD,as directed by  Rochel Brome, MD while in the presence of Rochel Brome, MD.   I attest that I have reviewed this visit and agree with the plan scribed by my staff.   Rochel Brome, MD [facility] [phone]

## 2022-12-24 NOTE — Progress Notes (Deleted)
Care Management & Coordination Services Pharmacy Team  Reason for Encounter: Diabetes  Contacted patient to discuss diabetes disease state. {US HC Outreach:28874}  Current antihyperglycemic regimen:  Farxiga '10mg'$  daily  Patient verbally confirms she is taking the above medications as directed. {yes/no:20286}  What diet changes have been made to improve diabetes control?  What recent interventions/DTPs have been made to improve glycemic control:  ***  Have there been any recent hospitalizations or ED visits since last visit with PharmD? No  Patient {reports/denies:24182} hypoglycemic symptoms, including {Hypoglycemic Symptoms:3049003}  Patient {reports/denies:24182} hyperglycemic symptoms, including {symptoms; hyperglycemia:17903}  How often are you checking your blood sugar? {BG Testing frequency:23922}  What are your blood sugars ranging?  Fasting: *** Before meals: *** After meals: *** Bedtime: ***  During the week, how often does your blood glucose drop below 70? {LowBGfrequency:24142}  Are you checking your feet daily/regularly? {yes/no:20286}  Adherence Review: Is the patient currently on a STATIN medication? No Is the patient currently on ACE/ARB medication? No Does the patient have >5 day gap between last estimated fill dates? {yes/no:20286}   Chart Updates:  Recent office visits:  12/24/22 Rochel Brome MD. Seen for routine visit. Started on Omeprazole '20mg'$  daily. D/C Praluent '150mg'$ .   Recent consult visits:  None  Hospital visits:  None  Medications: Outpatient Encounter Medications as of 12/24/2022  Medication Sig   aspirin 81 MG EC tablet Take 81 mg by mouth daily.    dapagliflozin propanediol (FARXIGA) 10 MG TABS tablet Take 1 tablet (10 mg total) by mouth daily.   digoxin (LANOXIN) 0.125 MG tablet Take 1 tablet by mouth once daily   ezetimibe (ZETIA) 10 MG tablet Take 1 tablet (10 mg total) by mouth daily.   HYDROcodone-acetaminophen (NORCO) 7.5-325  MG tablet Take 1 tablet by mouth every 6 (six) hours as needed for moderate pain.   ivabradine (CORLANOR) 7.5 MG TABS tablet Take 1 tablet (7.5 mg total) by mouth 2 (two) times daily with a meal.   LORATADINE PO Take by mouth daily. [facility] brand   metoprolol succinate (TOPROL-XL) 50 MG 24 hr tablet Take 1 tablet by mouth twice daily   nitroGLYCERIN (NITROSTAT) 0.4 MG SL tablet Place 0.4 mg under the tongue every 5 (five) minutes as needed for chest pain.   omeprazole (PRILOSEC) 20 MG capsule Take 1 capsule (20 mg total) by mouth daily.   Phenazopyridine HCl (AZO TABS PO) Take 1 capsule by mouth as needed.   raloxifene (EVISTA) 60 MG tablet Take 1 tablet (60 mg total) by mouth daily.   spironolactone (ALDACTONE) 25 MG tablet Take 1 tablet by mouth once daily   torsemide (DEMADEX) 100 MG tablet Take 1/2 (one-half) tablet by mouth once daily   No facility-administered encounter medications on file as of 12/24/2022.    Recent Relevant Labs: Lab Results  Component Value Date/Time   HGBA1C 6.7 (H) 09/10/2022 08:45 AM   HGBA1C 7.3 (H) 12/03/2021 11:05 AM   HGBA1C 6.1 10/25/2020 12:00 AM    Kidney Function Lab Results  Component Value Date/Time   CREATININE 0.94 09/10/2022 08:45 AM   CREATININE 0.90 07/11/2022 03:03 PM   GFR 71.82 07/22/2013 03:28 PM   GFRNONAA >60 07/11/2022 03:03 PM   GFRAA 70 01/01/2021 10:39 AM    Star Rating Drugs:  Medication:  Last Fill: [facility] [place] Gaps: Annual wellness visit in last year? No, 11/29/21 Last eye exam / retinopathy screening: 10/25/20 Last diabetic foot exam:04/03/21   Elray Mcgregor, CMA Clinical  Pharmacist Assistant  [phone]

## 2022-12-25 ENCOUNTER — Ambulatory Visit: Payer: HMO | Admitting: Family Medicine

## 2022-12-26 LAB — CBC WITH DIFFERENTIAL/PLATELET
Basophils Absolute: 0.1 10*3/uL (ref 0.0–0.2)
Basos: 1 %
EOS (ABSOLUTE): 0.2 10*3/uL (ref 0.0–0.4)
Eos: 2 %
Hematocrit: 50.5 % — ABNORMAL HIGH (ref 34.0–46.6)
Hemoglobin: 17.8 g/dL — ABNORMAL HIGH (ref 11.1–15.9)
Immature Grans (Abs): 0 10*3/uL (ref 0.0–0.1)
Immature Granulocytes: 0 %
Lymphocytes Absolute: 2.2 10*3/uL (ref 0.7–3.1)
Lymphs: 29 %
MCH: 31.4 pg (ref 26.6–33.0)
MCHC: 35.2 g/dL (ref 31.5–35.7)
MCV: 89 fL (ref 79–97)
Monocytes Absolute: 0.9 10*3/uL (ref 0.1–0.9)
Monocytes: 12 %
Neutrophils Absolute: 4.2 10*3/uL (ref 1.4–7.0)
Neutrophils: 56 %
Platelets: 287 10*3/uL (ref 150–450)
RBC: 5.67 x10E6/uL — ABNORMAL HIGH (ref 3.77–5.28)
RDW: 13 % (ref 11.7–15.4)
WBC: 7.5 10*3/uL (ref 3.4–10.8)

## 2022-12-26 LAB — COMPREHENSIVE METABOLIC PANEL
ALT: 24 IU/L (ref 0–32)
AST: 24 IU/L (ref 0–40)
Albumin/Globulin Ratio: 1.6 (ref 1.2–2.2)
Albumin: 4.6 g/dL (ref 3.9–4.9)
Alkaline Phosphatase: 89 IU/L (ref 44–121)
BUN/Creatinine Ratio: 16 (ref 12–28)
BUN: 15 mg/dL (ref 8–27)
Bilirubin Total: 0.6 mg/dL (ref 0.0–1.2)
CO2: 31 mmol/L — ABNORMAL HIGH (ref 20–29)
Calcium: 10.7 mg/dL — ABNORMAL HIGH (ref 8.7–10.3)
Chloride: 88 mmol/L — ABNORMAL LOW (ref 96–106)
Creatinine, Ser: 0.94 mg/dL (ref 0.57–1.00)
Globulin, Total: 2.9 g/dL (ref 1.5–4.5)
Glucose: 136 mg/dL — ABNORMAL HIGH (ref 70–99)
Potassium: 3.6 mmol/L (ref 3.5–5.2)
Sodium: 138 mmol/L (ref 134–144)
Total Protein: 7.5 g/dL (ref 6.0–8.5)
eGFR: 69 mL/min/{1.73_m2} (ref >59)

## 2022-12-26 LAB — CARDIOVASCULAR RISK ASSESSMENT

## 2022-12-26 LAB — MICROALBUMIN / CREATININE URINE RATIO
Creatinine, Urine: 12.2 mg/dL
Microalb/Creat Ratio: 43 mg/g creat — ABNORMAL HIGH (ref 0–29)
Microalbumin, Urine: 5.3 ug/mL

## 2022-12-26 LAB — LIPID PANEL
Chol/HDL Ratio: 4.5 ratio — ABNORMAL HIGH (ref 0.0–4.4)
Cholesterol, Total: 228 mg/dL — ABNORMAL HIGH (ref 100–199)
HDL: 51 mg/dL (ref >39)
LDL Chol Calc (NIH): 111 mg/dL — ABNORMAL HIGH (ref 0–99)
Triglycerides: 383 mg/dL — ABNORMAL HIGH (ref 0–149)
VLDL Cholesterol Cal: 66 mg/dL — ABNORMAL HIGH (ref 5–40)

## 2022-12-26 LAB — HEMOGLOBIN A1C
Est. average glucose Bld gHb Est-mCnc: 154 mg/dL
Hgb A1c MFr Bld: 7 % — ABNORMAL HIGH (ref 4.8–5.6)

## 2022-12-26 LAB — TSH: TSH: 3.72 u[IU]/mL (ref 0.450–4.500)

## 2022-12-28 ENCOUNTER — Encounter: Payer: Self-pay | Admitting: Family Medicine

## 2022-12-28 DIAGNOSIS — K219 Gastro-esophageal reflux disease without esophagitis: Secondary | ICD-10-CM | POA: Insufficient documentation

## 2022-12-28 DIAGNOSIS — E119 Type 2 diabetes mellitus without complications: Secondary | ICD-10-CM | POA: Insufficient documentation

## 2022-12-28 DIAGNOSIS — I152 Hypertension secondary to endocrine disorders: Secondary | ICD-10-CM | POA: Insufficient documentation

## 2022-12-28 DIAGNOSIS — Z23 Encounter for immunization: Secondary | ICD-10-CM | POA: Insufficient documentation

## 2022-12-28 MED ORDER — HYDROCODONE-ACETAMINOPHEN 7.5-325 MG PO TABS: 1.0000 | ORAL_TABLET | Freq: Four times a day (QID) | ORAL | 0 refills | Status: DC | PRN

## 2022-12-28 NOTE — Assessment & Plan Note (Signed)
Continue omperazole 20 mg daily

## 2022-12-28 NOTE — Assessment & Plan Note (Signed)
Continue aspirin 81 mg daily.  Continue zetia 10 mg daily, toprol xl 50 mg twice daily

## 2022-12-28 NOTE — Assessment & Plan Note (Signed)
Stable. 

## 2022-12-28 NOTE — Assessment & Plan Note (Signed)
Recommend continue to work on eating healthy diet and exercise.  

## 2022-12-28 NOTE — Assessment & Plan Note (Signed)
Management per specialist. 

## 2022-12-28 NOTE — Assessment & Plan Note (Signed)
Intolerant to statins. 

## 2022-12-28 NOTE — Assessment & Plan Note (Signed)
Continue evista.  Management per specialist.

## 2022-12-28 NOTE — Assessment & Plan Note (Signed)
Control: good Recommend check sugars fasting daily Recommend check feet daily. Recommend annual eye exams. Medicines: continue farxiga 10 mg daily  Continue to work on eating a healthy diet and exercise.  Labs drawn today.

## 2022-12-28 NOTE — Assessment & Plan Note (Signed)
Management by cardiology.

## 2022-12-28 NOTE — Assessment & Plan Note (Signed)
Control: Fair Recommend check sugars fasting daily. Recommend check feet daily. Recommend annual eye exams. Medicines: Farxiga 10 mg daily. Continue to work on eating a healthy diet and exercise.  Labs drawn today.

## 2022-12-28 NOTE — Assessment & Plan Note (Addendum)
Management for specialist. Continue metoprolol xl 50 mg twice daily, torsemide 100 mg 1/2 tablet daily, spironolactone 25 mg daily, Digoxin 0.125 mg daily, ivabradine 7.5 g twice daily, farxiga 10 mg daily. Upcoming echo scheduled by specialist.

## 2022-12-28 NOTE — Assessment & Plan Note (Signed)
Uses hydrocodone/apap sparingly.

## 2022-12-28 NOTE — Assessment & Plan Note (Signed)
Well controlled.  No changes to medicines. taking zetia 10 mg daily Continue to work on eating a healthy diet and exercise.  Labs drawn today.

## 2022-12-31 ENCOUNTER — Other Ambulatory Visit: Payer: Self-pay | Admitting: Family Medicine

## 2023-01-01 ENCOUNTER — Encounter: Payer: Self-pay | Admitting: Family Medicine

## 2023-01-01 ENCOUNTER — Other Ambulatory Visit: Payer: Self-pay

## 2023-01-01 DIAGNOSIS — I25119 Atherosclerotic heart disease of native coronary artery with unspecified angina pectoris: Secondary | ICD-10-CM

## 2023-01-01 MED ORDER — ICOSAPENT ETHYL 1 G PO CAPS: 2.0000 g | ORAL_CAPSULE | Freq: Two times a day (BID) | ORAL | 2 refills | Status: DC

## 2023-01-01 NOTE — Progress Notes (Signed)
Remote ICD transmission.   

## 2023-01-02 ENCOUNTER — Other Ambulatory Visit: Payer: Self-pay

## 2023-01-02 DIAGNOSIS — I25119 Atherosclerotic heart disease of native coronary artery with unspecified angina pectoris: Secondary | ICD-10-CM

## 2023-01-02 MED ORDER — VASCEPA 1 G PO CAPS: 2.0000 g | ORAL_CAPSULE | Freq: Two times a day (BID) | ORAL | 0 refills | Status: DC

## 2023-01-03 ENCOUNTER — Other Ambulatory Visit (HOSPITAL_COMMUNITY): Payer: Self-pay

## 2023-01-03 DIAGNOSIS — I5022 Chronic systolic (congestive) heart failure: Secondary | ICD-10-CM

## 2023-01-09 ENCOUNTER — Ambulatory Visit (HOSPITAL_COMMUNITY)
Admission: RE | Admit: 2023-01-09 | Discharge: 2023-01-09 | Disposition: A | Discharge status: Home / Self Care | Payer: PPO | Source: Ambulatory Visit | Attending: Internal Medicine | Admitting: Internal Medicine

## 2023-01-09 ENCOUNTER — Ambulatory Visit (HOSPITAL_BASED_OUTPATIENT_CLINIC_OR_DEPARTMENT_OTHER)
Admission: RE | Admit: 2023-01-09 | Discharge: 2023-01-09 | Disposition: A | Discharge status: Home / Self Care | Payer: PPO | Source: Ambulatory Visit | Attending: Internal Medicine | Admitting: Internal Medicine

## 2023-01-09 ENCOUNTER — Other Ambulatory Visit (HOSPITAL_COMMUNITY): Payer: Self-pay

## 2023-01-09 ENCOUNTER — Encounter (HOSPITAL_COMMUNITY): Payer: Self-pay | Admitting: Internal Medicine

## 2023-01-09 VITALS — BP 120/80 | HR 73 | Wt 221.8 lb

## 2023-01-09 DIAGNOSIS — R002 Palpitations: Secondary | ICD-10-CM | POA: Insufficient documentation

## 2023-01-09 DIAGNOSIS — Z853 Personal history of malignant neoplasm of breast: Secondary | ICD-10-CM | POA: Insufficient documentation

## 2023-01-09 DIAGNOSIS — I5022 Chronic systolic (congestive) heart failure: Secondary | ICD-10-CM | POA: Diagnosis not present

## 2023-01-09 DIAGNOSIS — Z7984 Long term (current) use of oral hypoglycemic drugs: Secondary | ICD-10-CM | POA: Insufficient documentation

## 2023-01-09 DIAGNOSIS — I110 Hypertensive heart disease with heart failure: Secondary | ICD-10-CM | POA: Diagnosis not present

## 2023-01-09 DIAGNOSIS — Z87891 Personal history of nicotine dependence: Secondary | ICD-10-CM | POA: Insufficient documentation

## 2023-01-09 DIAGNOSIS — Z7982 Long term (current) use of aspirin: Secondary | ICD-10-CM | POA: Diagnosis not present

## 2023-01-09 DIAGNOSIS — E785 Hyperlipidemia, unspecified: Secondary | ICD-10-CM | POA: Insufficient documentation

## 2023-01-09 DIAGNOSIS — Z90710 Acquired absence of both cervix and uterus: Secondary | ICD-10-CM | POA: Diagnosis not present

## 2023-01-09 DIAGNOSIS — I959 Hypotension, unspecified: Secondary | ICD-10-CM | POA: Diagnosis not present

## 2023-01-09 DIAGNOSIS — Z6838 Body mass index (BMI) 38.0-38.9, adult: Secondary | ICD-10-CM | POA: Insufficient documentation

## 2023-01-09 DIAGNOSIS — Z955 Presence of coronary angioplasty implant and graft: Secondary | ICD-10-CM | POA: Diagnosis not present

## 2023-01-09 DIAGNOSIS — R42 Dizziness and giddiness: Secondary | ICD-10-CM | POA: Insufficient documentation

## 2023-01-09 DIAGNOSIS — Z8249 Family history of ischemic heart disease and other diseases of the circulatory system: Secondary | ICD-10-CM | POA: Insufficient documentation

## 2023-01-09 DIAGNOSIS — I2510 Atherosclerotic heart disease of native coronary artery without angina pectoris: Secondary | ICD-10-CM | POA: Insufficient documentation

## 2023-01-09 DIAGNOSIS — D751 Secondary polycythemia: Secondary | ICD-10-CM | POA: Diagnosis not present

## 2023-01-09 DIAGNOSIS — I252 Old myocardial infarction: Secondary | ICD-10-CM | POA: Insufficient documentation

## 2023-01-09 DIAGNOSIS — Z79899 Other long term (current) drug therapy: Secondary | ICD-10-CM | POA: Insufficient documentation

## 2023-01-09 DIAGNOSIS — Z95810 Presence of automatic (implantable) cardiac defibrillator: Secondary | ICD-10-CM | POA: Insufficient documentation

## 2023-01-09 LAB — ECHOCARDIOGRAM COMPLETE
AR max vel: 2.59 cm2
AV Area VTI: 2.85 cm2
AV Area mean vel: 2.42 cm2
AV Mean grad: 4 mmHg
AV Peak grad: 7.5 mmHg
Ao pk vel: 1.37 m/s
Area-P 1/2: 5.27 cm2
Calc EF: 21.3 %
MV M vel: 4.89 m/s
MV Peak grad: 95.6 mmHg
MV VTI: 1.63 cm2
Radius: 0.6 cm
S' Lateral: 5.1 cm
Single Plane A2C EF: 11.7 %
Single Plane A4C EF: 30.2 %

## 2023-01-09 LAB — BASIC METABOLIC PANEL
Anion gap: 14 (ref 5–15)
BUN: 16 mg/dL (ref 8–23)
CO2: 28 mmol/L (ref 22–32)
Calcium: 10.2 mg/dL (ref 8.9–10.3)
Chloride: 95 mmol/L — ABNORMAL LOW (ref 98–111)
Creatinine, Ser: 0.85 mg/dL (ref 0.44–1.00)
GFR, Estimated: >60 mL/min (ref >60)
Glucose, Bld: 115 mg/dL — ABNORMAL HIGH (ref 70–99)
Potassium: 3.3 mmol/L — ABNORMAL LOW (ref 3.5–5.1)
Sodium: 137 mmol/L (ref 135–145)

## 2023-01-09 LAB — DIGOXIN LEVEL: Digoxin Level: 0.7 ng/mL — ABNORMAL LOW (ref 0.8–2.0)

## 2023-01-09 LAB — BRAIN NATRIURETIC PEPTIDE: B Natriuretic Peptide: 97.5 pg/mL (ref 0.0–100.0)

## 2023-01-09 MED ORDER — LOSARTAN POTASSIUM 25 MG PO TABS: 25.0000 mg | ORAL_TABLET | Freq: Every day | ORAL | 3 refills | Status: DC

## 2023-01-09 MED ORDER — PERFLUTREN LIPID MICROSPHERE
1.0000 mL | INTRAVENOUS | Status: DC | PRN
  Administered 2023-01-09: 5 mL via INTRAVENOUS

## 2023-01-09 NOTE — Progress Notes (Signed)
Advanced Heart Failure Clinic Note  Date:  07/11/2022   ID:  Allison White, DOB Nov 22, 1960, MRN [id number]  Location: Home  Provider location: [facility] Type of Visit: Established patient  PCP:  Lillard Anes, MD  Cardiologist:  None Primary HF: Kayly Kriegel  Chief Complaint: Heart Failure follow-up   History of Present Illness:  Allison White is a 62 y.o. female with morbid obesity, HTN, carotid artery dissection with stenting of left carotid due to possible FMD, CAD s/p anterior MI 1/18 (DES to LAD), systolic HF with EF 0000000 s/p Biotronik ICD.   Seen in 5/21 with worsening NYHA IIIb symptoms. Had R/L cath with normal coronary arteries; widely patent LAD stent, EF 20-25%, Normal hemodynamics with CI 3.4. -> CPX  = pVO2: 15.3 (90% predicted pVO2) - adjusted to iBW 25.29m/kg/min VE/VCO2 slope:  33   Seen in HF clinic 01/2021 with orthostasis. Reds 30%. Recommended ted hose. Sleep study deferred at patient/husband request.   Had lumpectomy 9/22 for abnormal mammogram. Finished 6 weeks of XRT in 12/22. No chemo.   Echo 11/22 EF 25-30% G1DD  RV ok   Echo today 01/09/23: EF 20-25% RV ok G2 DD mod MR Personally reviewed  Here for routine f/u. Says she is doing about the same. Good days/bad days. But able to do all ADLs without problem. Riding 6 miles per day on her exercise bike. Says SBP usually 110 but occasionally will go higher. No edema, orthopnea or PND. Compliant with all meds.    Cardiac Studies Echo 06/2017 EF 20-25% Echo 02/2018 LVEF 25-30%, Trivial MR, Normal RV, Mild TR, PA peak pressure 26 mm Hg Echo 8/20  EF 20-25% RV ok. Echo 12/21 EF 25%   CPX 8/21 FVC 3.08 (91%)      FEV1 2.30 (87%%)        FEV1/FVC 75 (94%)        MVV 89 (94%)  RER 1.10 Peak VO2: 15.3 (90% predicted peak VO2) - adjusted to iBW 25.657mkg/min VE/VCO2 slope:  33   Studies: R/L cath 04/07/20 Ao = 102/56 (75) LV = 104/13 RA = 3 RV = 27/4 PA = 28/9  (17) PCW = 7 Fick cardiac output/index = 7.2/3.4 PVR = 1.4 WU FA sat = 99% PA sat = 79%, 81% High SVC sat = 81%  Monitor 10/19 1. Sinus rhythm 2. Rare PVCs and bigeminy (< 1.0%) 3. Two patient-triggered events associated with isolated PVCs.  4. No high-grade arrhythmias     CPX 03/17/18 FVC 3.00 (87%), FEV1 2.97(88%), FEV1/FVC 79 (99%)  Peak VO2: 13.8 - When adjusted to the patient's ideal body weight of 142.2 lb (64.5 kg) the peak VO2 is 22.6 ml/kg VE/VCO2 slope: 38 OUES: 1.52 Peak RER: 1.11 Ventilatory Threshold: 11.8 VE/MVV:  95% O2pulse:  8 Interpretation: Mild limitation due to HF and obesity    Past Medical History:  Diagnosis Date   Cancer ([facility]   Rt Breast   CHF (congestive heart failure) ([facility]   FMD (facioscapulohumeral muscular dystrophy) ([facility]   Headache(784.0)    Heart failure (HCC)    Hyperlipidemia    MI (myocardial infarction) ([facility]   Migraine    Nonruptured cerebral aneurysm, internal carotid artery    Left side, stent placement (2009)   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    Vertigo    Past Surgical History:  Procedure Laterality Date   ABDOMINAL HYSTERECTOMY     BREAST LUMPECTOMY  Right 07/2021   CARDIAC DEFIBRILLATOR PLACEMENT     CEREBRAL ANEURYSM REPAIR Left 2009   CESAREAN SECTION     CORONARY ANGIOPLASTY WITH STENT PLACEMENT     PACEMAKER IMPLANT     RIGHT/LEFT HEART CATH AND CORONARY ANGIOGRAPHY N/A 04/07/2020   Procedure: RIGHT/LEFT HEART CATH AND CORONARY ANGIOGRAPHY;  Surgeon: Jolaine Artist, MD;  Location: [facility];  Service: Cardiovascular;  Laterality: N/A;   TUBAL LIGATION       Current Outpatient Medications  Medication Sig Dispense Refill   aspirin 81 MG EC tablet Take 81 mg by mouth daily.      dapagliflozin propanediol (FARXIGA) 10 MG TABS tablet Take 1 tablet (10 mg total) by mouth daily. 90 tablet 3   digoxin (LANOXIN) 0.125 MG tablet Take 1 tablet by mouth once daily 90 tablet 3   ezetimibe  (ZETIA) 10 MG tablet Take 1 tablet (10 mg total) by mouth daily. 90 tablet 3   HYDROcodone-acetaminophen (NORCO) 7.5-325 MG tablet Take 1 tablet by mouth every 6 (six) hours as needed for moderate pain. 90 tablet 0   ivabradine (CORLANOR) 7.5 MG TABS tablet Take 1 tablet (7.5 mg total) by mouth 2 (two) times daily with a meal. 180 tablet 3   LORATADINE PO Take by mouth daily. [facility] brand     metoprolol succinate (TOPROL-XL) 50 MG 24 hr tablet Take 1 tablet by mouth twice daily 180 tablet 3   nitroGLYCERIN (NITROSTAT) 0.4 MG SL tablet Place 0.4 mg under the tongue every 5 (five) minutes as needed for chest pain.     Phenazopyridine HCl (AZO TABS PO) Take 1 capsule by mouth as needed.     raloxifene (EVISTA) 60 MG tablet Take 1 tablet (60 mg total) by mouth daily. 90 tablet 3   spironolactone (ALDACTONE) 25 MG tablet Take 1 tablet by mouth once daily 90 tablet 2   torsemide (DEMADEX) 100 MG tablet Take 1/2 (one-half) tablet by mouth once daily 45 tablet 3   No current facility-administered medications for this encounter.    Allergies:   Tape, Plavix [clopidogrel bisulfate], and Tamoxifen   Social History:  The patient  reports that she has quit smoking. Her smoking use included cigarettes. She has a 6.00 pack-year smoking history. She has never used smokeless tobacco. She reports current alcohol use. She reports that she does not use drugs.   Family History:  The patient's family history includes Cerebral aneurysm in her paternal grandmother; Dementia in her paternal grandfather; Hypertension in her brother; Prostate cancer in her father and paternal grandfather; Thyroid disease in her mother.   ROS:  Please see the history of present illness.   All other systems are personally reviewed and negative.   Body mass index is 38.04 kg/m.  Vitals:   07/11/22 1439  BP: 112/70  Pulse: 69  SpO2: 98%  Weight: 103.7 kg (228 lb 9.6 oz)     Wt Readings from Last 3 Encounters:  07/11/22 103.7  kg (228 lb 9.6 oz)  07/08/22 102.5 kg (226 lb)  03/05/22 103.9 kg (229 lb)    Exam:   General:  Well appearing. No resp difficulty HEENT: normal Neck: supple. no JVD. Carotids 2+ bilat; no bruits. No lymphadenopathy or thryomegaly appreciated. Cor: PMI nondisplaced. Regular rate & rhythm. No rubs, gallops or murmurs. Lungs: clear Abdomen: obese soft, nontender, nondistended. No hepatosplenomegaly. No bruits or masses. Good bowel sounds. Extremities: no cyanosis, clubbing, rash, edema Neuro: alert & orientedx3, cranial nerves grossly  intact. moves all 4 extremities w/o difficulty. Affect pleasant   Recent Labs: 03/05/2022: ALT 19; BUN 20; Creatinine, Ser 0.99; Hemoglobin 17.7; Platelets 289; Potassium 4.4; Sodium 142    Wt Readings from Last 3 Encounters:  07/11/22 103.7 kg (228 lb 9.6 oz)  07/08/22 102.5 kg (226 lb)  03/05/22 103.9 kg (229 lb)      ASSESSMENT AND PLAN:  1. Chronic systolic HF, ICM - Echo [id number] LVEF 25-30%, Trivial MR, Normal RV, Mild TR, PA peak pressure 26 mm Hg - s/p Biotronik ICD (Followed by Dr. Shonna Chock) - Echo 07/13/19 EF 20-25% - Cath 5/21 EF 20-25% normal coronaries. Well compensated hemodynamics with CI 3.4 - Echo 12/21 EF 25% - CPX 8/21 very reassuring - Peak VO2: 15.3 (90% predicted peak VO2) - adjusted to iBW 25.47m/kg/min. VEVCO2 33  - Echo 11/22 EF 25-30% G1DD  RV ok  - Echo today 01/09/23: EF 20-25% RV ok G2 DD mod MR Personally reviewed - Stable NYHA II-III - Volume status stable on torsemide 593mdaily - Entresto stopped due to low BP - Start losartan 25 qhs - Continue spiro 25 mg daily.  - Continue toprol 50 mg BID - Continue digoxin 0.125 mg.  - Continue Farxiga 10 mg daily.  - Continue ivabradine 7.5 bid - Labs today - Continues to do well despite severe LV dysfunction. Low threshold to repeat CPX  2. CAD s/p anterior MI 1/18 with DES to LAD in [place] - cath 5/21 normal cors with patent LAD stent - no s/s angina - Failed statin. Now on  Zetia - Goal LDL < 70. Followed by Dr. CoTobie Poet 3. Obesity Body mass index is 38.04 kg/m. - Consider GLP1RA   4. 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 No changes   5. Palpitations - Intermittent. - Zio patch 10/19 was ok  - We discussed getting AliveCor device if these persist   6. Breast Cancer - s/p lumpectomy 9/22 followed by XRT. Completed 12/22 - no plans for chemo  7. Polycythemia - followed by hematology. BMBx was normal - Home sleep study 1/22 AHI 4.6    Signed, DaGlori BickersMD  07/11/2022 2:55 PM  [facility] and [phone]) [phone])

## 2023-01-09 NOTE — Addendum Note (Signed)
Encounter addended by: Jerl Mina, RN on: 01/09/2023 10:51 AM  Actions taken: Pharmacy for encounter modified, Order list changed, Diagnosis association updated, Clinical Note Signed, Charge Capture section accepted

## 2023-01-09 NOTE — Patient Instructions (Signed)
START Losartan 25 mg nightly.  Labs done today, your results will be available in MyChart, we will contact you for abnormal readings.  Your physician recommends that you schedule a follow-up appointment in: 6 months ( August ) ** please call the office in May to arrange your follow up appointment.**  If you have any questions or concerns before your next appointment please send [place] a message through [place] or call our office at [phone].    TO LEAVE A MESSAGE FOR THE NURSE SELECT OPTION 2, PLEASE LEAVE A MESSAGE INCLUDING: YOUR NAME DATE OF BIRTH CALL BACK NUMBER REASON FOR CALL**this is important as we prioritize the call backs  YOU WILL RECEIVE A CALL BACK THE SAME DAY AS LONG AS YOU CALL BEFORE 4:00 PM  At the [facility], you and your health needs are our priority. As part of our continuing mission to provide you with exceptional heart care, we have created designated Provider Care Teams. These Care Teams include your primary Cardiologist (physician) and Advanced Practice Providers (APPs- Physician Assistants and Nurse Practitioners) who all work together to provide you with the care you need, when you need it.   You may see any of the following providers on your designated Care Team at your next follow up: Dr Glori Bickers Dr Loralie Champagne Dr. [name], NP Lyda Jester, [place] [facility] [place], [place] Forestine Na, NP Audry Riles, PharmD   Please be sure to bring in all your medications bottles to every appointment.    Thank you for choosing [facility]

## 2023-01-10 ENCOUNTER — Other Ambulatory Visit (HOSPITAL_COMMUNITY): Payer: Self-pay

## 2023-01-14 ENCOUNTER — Telehealth: Payer: Self-pay

## 2023-01-14 ENCOUNTER — Other Ambulatory Visit (HOSPITAL_COMMUNITY): Payer: Self-pay

## 2023-01-14 NOTE — Telephone Encounter (Signed)
Contacted [name] to schedule their annual wellness visit. Appointment made for 01/22/23.  Norton Blizzard, Tracy (AAMA)  [place] [facility] [phone]

## 2023-01-15 ENCOUNTER — Other Ambulatory Visit (HOSPITAL_COMMUNITY): Payer: Self-pay

## 2023-01-20 ENCOUNTER — Encounter (HOSPITAL_COMMUNITY): Payer: Self-pay | Admitting: Internal Medicine

## 2023-01-20 ENCOUNTER — Other Ambulatory Visit (HOSPITAL_COMMUNITY): Payer: Self-pay

## 2023-01-20 ENCOUNTER — Telehealth (HOSPITAL_COMMUNITY): Payer: Self-pay

## 2023-01-20 NOTE — Telephone Encounter (Signed)
Advanced Heart Failure Patient Advocate Encounter  The patient was approved for a [employer] grant that will help cover the cost of Vascepa.  Total amount awarded, $10,000.  Effective: 12/21/22 - 12/21/23.  BIN [id number] PCN [id number] Group [id number] ID [id number]  Processing information provided directly to pharmacy, confirmed $0 copay, left patient voicemail with update.  Clista Bernhardt, CPhT Rx Patient Advocate Phone: [phone]

## 2023-01-22 ENCOUNTER — Ambulatory Visit (INDEPENDENT_AMBULATORY_CARE_PROVIDER_SITE_OTHER): Payer: PPO

## 2023-01-22 VITALS — BP 108/80 | HR 67 | Resp 16 | Ht 65.0 in | Wt 221.0 lb

## 2023-01-22 DIAGNOSIS — Z0000 Encounter for general adult medical examination without abnormal findings: Secondary | ICD-10-CM

## 2023-01-23 NOTE — Patient Instructions (Signed)
Allison White , Thank you for taking time to come for your Medicare Wellness Visit. I appreciate your ongoing commitment to your health goals. Please review the following plan we discussed and let me know if I can assist you in the future.   Screening recommendations/referrals: Colonoscopy: Due 09/2027 Mammogram: Due Recommended yearly ophthalmology/optometry visit for glaucoma screening and checkup Recommended yearly dental visit for hygiene and checkup  Vaccinations: Influenza vaccine: up-to-date Tdap vaccine: Due - You can get this at the pharmacy Shingles vaccine: Allison White, Female Preventive care refers to lifestyle choices and visits with your health care provider that can promote health and wellness. What does preventive care include? A yearly physical exam. This is also called an annual well check. Dental exams once or twice a year. Routine eye exams. Ask your health care provider how often you should have your eyes checked. Personal lifestyle choices, including: Daily care of your teeth and gums. Regular physical activity. Eating a healthy diet. Avoiding tobacco and drug use. Limiting alcohol use. Practicing safe sex. Taking low-dose aspirin daily starting at age 74. Taking vitamin and mineral supplements as recommended by your health care provider. What happens during an annual well check? The services and screenings done by your health care provider during your annual well check will depend on your age, overall health, lifestyle risk factors, and family history of disease. Counseling  Your health care provider may ask you questions about your: Alcohol use. Tobacco use. Drug use. Emotional well-being. Home and relationship well-being. Sexual activity. Eating habits. Work and work Statistician. Method of birth control. Menstrual cycle. Pregnancy history. Screening  You may have the following tests or measurements: Height, weight, and  BMI. Blood pressure. Lipid and cholesterol levels. These may be checked every 5 White, or more frequently if you are over 69 White old. Skin check. Lung cancer screening. You may have this screening every year starting at age 33 if you have a 30-pack-year history of smoking and currently smoke or have quit within the past 15 White. Fecal occult blood test (FOBT) of the stool. You may have this test every year starting at age 68. Flexible sigmoidoscopy or colonoscopy. You may have a sigmoidoscopy every 5 White or a colonoscopy every 10 White starting at age 60. Hepatitis C blood test. Hepatitis B blood test. Sexually transmitted disease (STD) testing. Diabetes screening. This is done by checking your blood sugar (glucose) after you have not eaten for a while (fasting). You may have this done every 1-3 White. Mammogram. This may be done every 1-2 White. Talk to your health care provider about when you should start having regular mammograms. This may depend on whether you have a family history of breast cancer. BRCA-related cancer screening. This may be done if you have a family history of breast, ovarian, tubal, or peritoneal cancers. Pelvic exam and Pap test. This may be done every 3 White starting at age 33. Starting at age 18, this may be done every 5 White if you have a Pap test in combination with an HPV test. Bone density scan. This is done to screen for osteoporosis. You may have this scan if you are at high risk for osteoporosis. Discuss your test results, treatment options, and if necessary, the need for more tests with your health care provider. Vaccines  Your health care provider may recommend certain vaccines, such as: Influenza vaccine. This is recommended every year. Tetanus, diphtheria, and acellular pertussis (Tdap, Td) vaccine. You  may need a Td booster every 10 White. Zoster vaccine. You may need this after age 77. Pneumococcal 13-valent conjugate (PCV13) vaccine. You may need  this if you have certain conditions and were not previously vaccinated. Pneumococcal polysaccharide (PPSV23) vaccine. You may need one or two doses if you smoke cigarettes or if you have certain conditions. Talk to your health care provider about which screenings and vaccines you need and how often you need them. This information is not intended to replace advice given to you by your health care provider. Make sure you discuss any questions you have with your health care provider. Document Released: 12/01/2015 Document Revised: 07/24/2016 Document Reviewed: 09/05/2015 Elsevier Interactive Patient Education  2017 [place] Prevention in the Home Falls can cause injuries. They can happen to people of all ages. There are many things you can do to make your home safe and to help prevent falls. What can I do on the outside of my home? Regularly fix the edges of walkways and driveways and fix any cracks. Remove anything that might make you trip as you walk through a door, such as a raised step or threshold. Trim any bushes or trees on the path to your home. Use bright outdoor lighting. Clear any walking paths of anything that might make someone trip, such as rocks or tools. Regularly check to see if handrails are loose or broken. Make sure that both sides of any steps have handrails. Any raised decks and porches should have guardrails on the edges. Have any leaves, snow, or ice cleared regularly. Use sand or salt on walking paths during winter. Clean up any spills in your garage right away. This includes oil or grease spills. What can I do in the bathroom? Use night lights. Install grab bars by the toilet and in the tub and shower. Do not use towel bars as grab bars. Use non-skid mats or decals in the tub or shower. If you need to sit down in the shower, use a plastic, non-slip stool. Keep the floor dry. Clean up any water that spills on the floor as soon as it happens. Remove soap  buildup in the tub or shower regularly. Attach bath mats securely with double-sided non-slip rug tape. Do not have throw rugs and other things on the floor that can make you trip. What can I do in the bedroom? Use night lights. Make sure that you have a light by your bed that is easy to reach. Do not use any sheets or blankets that are too big for your bed. They should not hang down onto the floor. Have a firm chair that has side arms. You can use this for support while you get dressed. Do not have throw rugs and other things on the floor that can make you trip. What can I do in the kitchen? Clean up any spills right away. Avoid walking on wet floors. Keep items that you use a lot in easy-to-reach places. If you need to reach something above you, use a strong step stool that has a grab bar. Keep electrical cords out of the way. Do not use floor polish or wax that makes floors slippery. If you must use wax, use non-skid floor wax. Do not have throw rugs and other things on the floor that can make you trip. What can I do with my stairs? Do not leave any items on the stairs. Make sure that there are handrails on both sides of the stairs  and use them. Fix handrails that are broken or loose. Make sure that handrails are as long as the stairways. Check any carpeting to make sure that it is firmly attached to the stairs. Fix any carpet that is loose or worn. Avoid having throw rugs at the top or bottom of the stairs. If you do have throw rugs, attach them to the floor with carpet tape. Make sure that you have a light switch at the top of the stairs and the bottom of the stairs. If you do not have them, ask someone to add them for you. What else can I do to help prevent falls? Wear shoes that: Do not have high heels. Have rubber bottoms. Are comfortable and fit you well. Are closed at the toe. Do not wear sandals. If you use a stepladder: Make sure that it is fully opened. Do not climb a closed  stepladder. Make sure that both sides of the stepladder are locked into place. Ask someone to hold it for you, if possible. Clearly mark and make sure that you can see: Any grab bars or handrails. First and last steps. Where the edge of each step is. Use tools that help you move around (mobility aids) if they are needed. These include: Canes. Walkers. Scooters. Crutches. Turn on the lights when you go into a dark area. Replace any light bulbs as soon as they burn out. Set up your furniture so you have a clear path. Avoid moving your furniture around. If any of your floors are uneven, fix them. If there are any pets around you, be aware of where they are. Review your medicines with your doctor. Some medicines can make you feel dizzy. This can increase your chance of falling. Ask your doctor what other things that you can do to help prevent falls. This information is not intended to replace advice given to you by your health care provider. Make sure you discuss any questions you have with your health care provider. Document Released: 08/31/2009 Document Revised: 04/11/2016 Document Reviewed: 12/09/2014 Elsevier Interactive Patient Education  2017 [employer].

## 2023-01-23 NOTE — Progress Notes (Signed)
Subjective:   Allison White is a 62 y.o. female who presents for Medicare Annual (Subsequent) preventive examination.  This wellness visit is conducted by a nurse.  The patient's medications were reviewed and reconciled since the patient's last visit.  History details were provided by the patient.  The history appears to be reliable.    Patient's last AWV was one year ago.   Medical History: Patient history and Family history was reviewed  Medications, Allergies, and preventative health maintenance was reviewed and updated.  Cardiac Risk Factors include: diabetes mellitus;dyslipidemia;hypertension;obesity (BMI >30kg/m2)     Objective:    Today's Vitals   01/22/23 1500  BP: 108/80  Pulse: 67  Resp: 16  SpO2: 97%  Weight: 221 lb (100.2 kg)  Height: '5\' 5"'$  (1.651 m)   Body mass index is 36.78 kg/m.     08/22/2022    2:23 PM 02/19/2022    2:12 PM 12/02/2021    1:34 PM 10/26/2020    3:03 PM 04/07/2020   11:33 AM 07/18/2017    9:53 AM  Advanced Directives  Does Patient Have a Medical Advance Directive? No No No No No No  Would patient like information on creating a medical advance directive?   No - Patient declined  No - Patient declined     Current Medications (verified) Outpatient Encounter Medications as of 01/22/2023  Medication Sig   aspirin 81 MG EC tablet Take 81 mg by mouth daily.    dapagliflozin propanediol (FARXIGA) 10 MG TABS tablet Take 1 tablet (10 mg total) by mouth daily.   digoxin (LANOXIN) 0.125 MG tablet Take 1 tablet by mouth once daily   ezetimibe (ZETIA) 10 MG tablet Take 1 tablet (10 mg total) by mouth daily.   HYDROcodone-acetaminophen (NORCO) 7.5-325 MG tablet Take 1 tablet by mouth every 6 (six) hours as needed for moderate pain.   ibuprofen (ADVIL) 200 MG tablet Take 200 mg by mouth as needed.   icosapent Ethyl (VASCEPA) 1 g capsule Take 2 capsules (2 g total) by mouth 2 (two) times daily.   ivabradine (CORLANOR) 7.5 MG TABS tablet Take 1 tablet (7.5 mg  total) by mouth 2 (two) times daily with a meal.   LORATADINE PO Take by mouth daily. [facility] brand   losartan (COZAAR) 25 MG tablet Take 1 tablet (25 mg total) by mouth daily.   metoprolol succinate (TOPROL-XL) 50 MG 24 hr tablet Take 1 tablet by mouth twice daily   nitroGLYCERIN (NITROSTAT) 0.4 MG SL tablet Place 0.4 mg under the tongue every 5 (five) minutes as needed for chest pain.   omeprazole (PRILOSEC) 20 MG capsule Take 1 capsule (20 mg total) by mouth daily.   Phenazopyridine HCl (AZO TABS PO) Take 1 capsule by mouth as needed.   raloxifene (EVISTA) 60 MG tablet Take 1 tablet (60 mg total) by mouth daily.   spironolactone (ALDACTONE) 25 MG tablet Take 1 tablet by mouth once daily   torsemide (DEMADEX) 100 MG tablet Take 1/2 (one-half) tablet by mouth once daily   No facility-administered encounter medications on file as of 01/22/2023.    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    Vertigo    Past Surgical History:  Procedure Laterality Date   ABDOMINAL HYSTERECTOMY  08/16/2004   Uterus and Cervix (per pathology)   BREAST LUMPECTOMY Right 07/2021   CARDIAC DEFIBRILLATOR PLACEMENT     CEREBRAL ANEURYSM REPAIR Left 2009   CESAREAN SECTION     CORONARY ANGIOPLASTY WITH STENT PLACEMENT     PACEMAKER IMPLANT     RIGHT/LEFT HEART CATH AND CORONARY ANGIOGRAPHY N/A 04/07/2020   Procedure: RIGHT/LEFT HEART CATH AND CORONARY ANGIOGRAPHY;  Surgeon: Jolaine Artist, MD;  Location: [facility];  Service: Cardiovascular;  Laterality: N/A;   TUBAL LIGATION     Family History  Problem Relation Age of Onset   Thyroid disease Mother    Gastric cancer Mother 80       w/ signet ring features   Prostate cancer Father        d. 77   Hypertension Brother    Breast cancer Maternal Aunt 51   Brain cancer Maternal Aunt 70   Prostate cancer Paternal Uncle        x2 pat uncles   Prostate cancer Maternal Grandfather        metastatic; d. 68   Cerebral aneurysm Paternal Grandmother        Nonruptured   Dementia Paternal Grandfather    Prostate cancer Paternal Grandfather        metastatic   Social History   Socioeconomic History   Marital status: Married    Spouse name: Tim   Number of children: 4   Years of education: Not on file   Highest education level: High school graduate  Occupational History   Not on file  Tobacco Use   Smoking status: Former    Packs/day: 1.00    Years: 6.00    Total pack years: 6.00    Types: Cigarettes   Smokeless tobacco: Never   Tobacco comments:    Quit over 30 years ago  Vaping Use   Vaping Use: Never used  Substance and Sexual Activity   Alcohol use: Yes    Comment: Occasionally "little"   Drug use: No   Sexual activity: Not Currently  Other Topics Concern   Not on file  Social History Narrative   Lives with husband in a one-story home.     Right handed   Caffeine: "very little"   Social Determinants of Health   Financial Resource Strain: Medium Risk (01/23/2023)   Overall Financial Resource Strain (CARDIA)    Difficulty of Paying Living Expenses: Somewhat hard  Food Insecurity: No Food Insecurity (01/23/2023)   Hunger Vital Sign    Worried About Running Out of Food in the Last Year: Never true    Ran Out of Food in the Last Year: Never true  Transportation Needs: No Transportation Needs (01/23/2023)   PRAPARE - Hydrologist (Medical): No    Lack of Transportation (Non-Medical): No  Physical  Activity: Sufficiently Active (01/23/2023)   Exercise Vital Sign    Days of Exercise per Week: 4 days    Minutes of Exercise per Session: 90 min  Stress: No Stress Concern Present (01/23/2023)   [place]    Feeling of Stress : Not at all  Social Connections: Moderately Integrated (10/26/2020)  Social Licensed conveyancer [NHANES]    Frequency of Communication with Friends and Family: More than three times a week    Frequency of Social Gatherings with Friends and Family: Twice a week    Attends Religious Services: 1 to 4 times per year    Active Member of [employer] or Organizations: No    Attends Music therapist: Never    Marital Status: Married    Tobacco Counseling Counseling given: Not Answered Tobacco comments: Quit over 30 years ago   Clinical Intake:  Pre-visit preparation completed: Yes     BMI - recorded: 36.78 Nutritional Status: BMI > 30  Obese Nutritional Risks: None How often do you need to have someone help you when you read instructions, pamphlets, or other written materials from your doctor or pharmacy?: 2 - Rarely Interpreter Needed?: No    Activities of Daily Living    01/23/2023   10:38 AM  In your present state of health, do you have any difficulty performing the following activities:  Hearing? 0  Vision? 0  Difficulty concentrating or making decisions? 0  Walking or climbing stairs? 0  Dressing or bathing? 0  Doing errands, shopping? 0  Preparing Food and eating ? N  Using the Toilet? N  In the past six months, have you accidently leaked urine? N  Do you have problems with loss of bowel control? N  Managing your Medications? N  Managing your Finances? N  Housekeeping or managing your Housekeeping? N    Patient Care Team: Rochel Brome, MD as PCP - General (Family Medicine) Constance Haw, MD as PCP - Electrophysiology (Cardiology) Bensimhon, Shaune Pascal, MD  as PCP - Advanced Heart Failure (Cardiology) Marice Potter, MD as Consulting Physician (Oncology) Gatha Mayer, MD as Consulting Physician (Radiation Oncology) Lane Hacker, [facility] (Pharmacist) Luanne Bras, MD as Consulting Physician (Interventional Radiology) Marice Potter, MD as Consulting Physician (Oncology)     Assessment:   This is a routine wellness examination for Cyndia.  Hearing/Vision screen No results found.  Dietary issues and exercise activities discussed: Current Exercise Habits: Home exercise routine, Type of exercise: Other - see comments;walking (bike), Time (Minutes): 40, Frequency (Times/Week): 4, Weekly Exercise (Minutes/Week): 160, Intensity: Mild, Exercise limited by: None identified  Depression Screen    01/23/2023   10:38 AM 12/24/2022   11:15 AM 11/29/2021    1:35 PM 08/03/2021    9:08 AM 10/26/2020    3:04 PM 07/17/2020   10:28 AM 07/17/2020    9:29 AM  PHQ 2/9 Scores  PHQ - 2 Score 0 0 0 0 0 0 0  PHQ- 9 Score      8 8    Fall Risk    01/23/2023   10:37 AM 12/24/2022   11:15 AM 07/12/2022    8:49 AM 12/02/2021    1:34 PM 08/03/2021    9:09 AM  Fall Risk   Falls in the past year? 0 0 0 1 0  Number falls in past yr: 0 0 0 1 0  Injury with Fall? 0 0 0 0 0  Risk for fall due to : No Fall Risks No Fall Risks No Fall Risks History of fall(s)   Follow up Falls evaluation completed;Education provided Falls evaluation completed Falls evaluation completed Falls evaluation completed;Falls prevention discussed     FALL RISK PREVENTION PERTAINING TO THE HOME:  Any stairs in or around the home? Yes  If so, are there any without handrails? No  Home free of loose throw rugs in walkways, pet beds, electrical cords, etc? Yes  Adequate lighting in your home to reduce risk of falls? Yes   ASSISTIVE DEVICES UTILIZED TO PREVENT FALLS:  Life alert? No  Use of a cane, walker or w/c? No  Gait slow and steady without use of assistive device  Cognitive  Function:        01/23/2023   10:39 AM 12/02/2021    1:37 PM 10/26/2020    3:06 PM  6CIT Screen  What Year? 0 points 0 points 0 points  What month? 0 points 0 points 0 points  What time? 0 points 0 points 0 points  Count back from 20 0 points 0 points 0 points  Months in reverse 0 points 0 points 0 points  Repeat phrase 0 points 0 points 0 points  Total Score 0 points 0 points 0 points    Immunizations Immunization History  Administered Date(s) Administered   Influenza Inj Mdck Quad Pf 10/26/2020, 11/29/2021, 12/24/2022   PFIZER(Purple Top)SARS-COV-2 Vaccination 02/21/2020, 03/20/2020   Pneumococcal Polysaccharide-23 09/08/2019    TDAP status: Due, Education has been provided regarding the importance of this vaccine. Advised may receive this vaccine at local pharmacy or Health Dept. Aware to provide a copy of the vaccination record if obtained from local pharmacy or Health Dept. Verbalized acceptance and understanding.  Flu Vaccine status: Up to date  Covid-19 vaccine status: Declined, Education has been provided regarding the importance of this vaccine but patient still declined. Advised may receive this vaccine at local pharmacy or Health Dept.or vaccine clinic. Aware to provide a copy of the vaccination record if obtained from local pharmacy or Health Dept. Verbalized acceptance and understanding.  Qualifies for Shingles Vaccine? Yes   Zostavax completed No   Shingrix Completed?: No.    Education has been provided regarding the importance of this vaccine. Patient has been advised to call insurance company to determine out of pocket expense if they have not yet received this vaccine. Advised may also receive vaccine at local pharmacy or Health Dept. Verbalized acceptance and understanding.  Screening Tests Health Maintenance  Topic Date Due   DTaP/Tdap/Td (1 - Tdap) Never done   Zoster Vaccines- Shingrix (1 of 2) Never done   COVID-19 Vaccine (3 - Pfizer risk series) 04/17/2020    OPHTHALMOLOGY EXAM  10/25/2021   FOOT EXAM  04/03/2022   Medicare Annual Wellness (AWV)  11/29/2022   MAMMOGRAM  05/15/2023   HEMOGLOBIN A1C  06/24/2023   Diabetic kidney evaluation - Urine ACR  12/25/2023   Diabetic kidney evaluation - eGFR measurement  01/10/2024   COLONOSCOPY (Pts 45-47yr Insurance coverage will need to be confirmed)  10/02/2027   INFLUENZA VACCINE  Completed   HPV VACCINES  Aged Out   Fecal DNA (Cologuard)  Discontinued    Health Maintenance  Health Maintenance Due  Topic Date Due   DTaP/Tdap/Td (1 - Tdap) Never done   Zoster Vaccines- Shingrix (1 of 2) Never done   COVID-19 Vaccine (3 - Pfizer risk series) 04/17/2020   OPHTHALMOLOGY EXAM  10/25/2021   FOOT EXAM  04/03/2022   Medicare Annual Wellness (AWV)  11/29/2022    Colorectal cancer screening: Type of screening: Colonoscopy. Completed 10/01/2022. Repeat every 5 years  Mammogram status: Completed 04/2021. Repeat every year  Lung Cancer Screening: (Low Dose CT Chest recommended if Age 61-80years, 30 pack-year currently smoking OR have quit w/in 15years.) does not qualify.   Lung Cancer Screening Referral: N/A  Additional Screening:  Vision Screening: Recommended annual ophthalmology exams for early detection of glaucoma and other disorders of the eye. Is the patient up to date with their annual eye exam?  No   Dental Screening: Recommended annual dental exams for proper oral hygiene  Community Resource Referral / Chronic Care Management: CRR required this visit?  No   CCM required this visit?  No      Plan:    1- Tetanus vaccine recommended 2- Shingrix Vaccine declined 3- Retinopathy screening recommended 4- Check on glucometer  I have personally reviewed and noted the following in the patient's chart:   Medical and social history Use of alcohol, tobacco or illicit drugs  Current medications and supplements including opioid prescriptions. Patient is currently taking opioid  prescriptions. Information provided to patient regarding non-opioid alternatives. Patient advised to discuss non-opioid treatment plan with their provider. Functional ability and status Nutritional status Physical activity Advanced directives List of other physicians Hospitalizations, surgeries, and ER visits in previous 12 months Vitals Screenings to include cognitive, depression, and falls Referrals and appointments  In addition, I have reviewed and discussed with patient certain preventive protocols, quality metrics, and best practice recommendations. A written personalized care plan for preventive services as well as general preventive health recommendations were provided to patient.     Erie Noe, LPN   [phone]

## 2023-02-09 ENCOUNTER — Other Ambulatory Visit: Payer: Self-pay

## 2023-02-09 ENCOUNTER — Other Ambulatory Visit: Payer: Self-pay | Admitting: Oncology

## 2023-02-09 MED ORDER — SPIRONOLACTONE 25 MG PO TABS: 25.0000 mg | ORAL_TABLET | Freq: Every day | ORAL | 2 refills | Status: DC

## 2023-02-21 ENCOUNTER — Ambulatory Visit: Payer: HMO | Admitting: Oncology

## 2023-02-21 ENCOUNTER — Other Ambulatory Visit: Payer: HMO

## 2023-02-23 NOTE — Progress Notes (Signed)
[facility]  [address] [place],  [place]  [address] [phone]  Clinic Day:  02/24/2023  Referring physician: Abigail Miyamoto,*  HISTORY OF PRESENT ILLNESS:  The patient is a 62 y.o. 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  She comes in today for routine follow-up.  Since her last visit, the patient has been doing fine.  Of note, she now takes raloxifene as tamoxifen caused significant nausea.  The patient denies having any particular changes in her breasts which concern her for disease recurrence.    She is also followed for polycythemia.  Of note, JAK2 mutation testing came back negative.  She denies having headaches, vision changes, or other hyperviscosity-related symptoms which concern her for complications related to her elevated red cell indices.  She brings to my attention that she has been taking both torsemide and spironolactone for years for her congestive heart failure.  These diuretics can lead to vascular contraction and secondary pseudo-polycythemia.   PHYSICAL EXAM:  Blood pressure 137/82, pulse 71, temperature 97.7 F (36.5 C), resp. rate 16, height 5\' 5"  (1.651 m), weight 219 lb 8 oz (99.6 kg), SpO2 97 %. Wt Readings from Last 3 Encounters:  02/24/23 219 lb 8 oz (99.6 kg)  01/22/23 221 lb (100.2 kg)  01/09/23 221 lb 12.8 oz (100.6 kg)   Body mass index is 36.53 kg/m. Performance status (ECOG): 0 - Asymptomatic Physical Exam Constitutional:      Appearance: Normal appearance.  HENT:     Mouth/Throat:     Pharynx: Oropharynx is clear. No oropharyngeal exudate.  Cardiovascular:     Rate and Rhythm: Normal rate and regular rhythm.     Heart sounds: No murmur heard.    No friction rub. No gallop.  Pulmonary:     Breath sounds: Normal breath sounds.  Chest:  Breasts:    Right: No swelling, bleeding, inverted nipple, mass, nipple discharge or  skin change.     Left: No swelling, bleeding, inverted nipple, mass, nipple discharge or skin change.  Abdominal:     General: Bowel sounds are normal. There is no distension.     Palpations: Abdomen is soft. There is no mass.     Tenderness: There is no abdominal tenderness.  Musculoskeletal:        General: No tenderness.     Cervical back: Normal range of motion and neck supple.     Right lower leg: No edema.     Left lower leg: No edema.  Lymphadenopathy:     Cervical: No cervical adenopathy.     Right cervical: No superficial, deep or posterior cervical adenopathy.    Left cervical: No superficial, deep or posterior cervical adenopathy.     Upper Body:     Right upper body: No supraclavicular or axillary adenopathy.     Left upper body: No supraclavicular or axillary adenopathy.     Lower Body: No right inguinal adenopathy. No left inguinal adenopathy.  Skin:    Coloration: Skin is not jaundiced.     Findings: No lesion or rash.  Neurological:     General: No focal deficit present.     Mental Status: She is alert and oriented to person, place, and time. Mental status is at baseline.  Psychiatric:        Mood and Affect: Mood normal.        Behavior: Behavior normal.  Thought Content: Thought content normal.        Judgment: Judgment normal.     LABS:    ASSESSMENT & PLAN:  A 61 y.o. 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  She also likely has pseudopolycythemia.  Based upon her clinical breast exam today, the patient remains disease-free.  She knows to continue taking raloxifene for her 5 years of chemoprevention. With respect to her polycythemia, her hemoglobin has completely normalized.  I believe she previously had pseudopolycythemia from her chronic diuretic use.  Clinically, this patient is doing well.  I will see her back in 6 months for repeat clinical assessment.  Her annual mammogram will  be scheduled before her next visit for her continued radiographic breast cancer surveillance.  The patient understands all the plans discussed today and is in agreement with them.  Olive Zmuda Kirby Funk, MD

## 2023-02-24 ENCOUNTER — Inpatient Hospital Stay: Payer: PPO | Attending: Oncology

## 2023-02-24 ENCOUNTER — Other Ambulatory Visit: Payer: Self-pay

## 2023-02-24 ENCOUNTER — Inpatient Hospital Stay (INDEPENDENT_AMBULATORY_CARE_PROVIDER_SITE_OTHER): Payer: PPO | Admitting: Oncology

## 2023-02-24 VITALS — BP 137/82 | HR 71 | Temp 97.7°F | Resp 16 | Ht 65.0 in | Wt 219.5 lb

## 2023-02-24 DIAGNOSIS — D45 Polycythemia vera: Secondary | ICD-10-CM | POA: Diagnosis not present

## 2023-02-24 DIAGNOSIS — D649 Anemia, unspecified: Secondary | ICD-10-CM | POA: Diagnosis not present

## 2023-02-24 DIAGNOSIS — C50919 Malignant neoplasm of unspecified site of unspecified female breast: Secondary | ICD-10-CM | POA: Diagnosis not present

## 2023-02-24 LAB — CBC AND DIFFERENTIAL
HCT: 44 (ref 36–46)
Hemoglobin: 15.3 (ref 12.0–16.0)
Neutrophils Absolute: 3.89
Platelets: 233 10*3/uL (ref 150–400)
WBC: 6.6

## 2023-02-24 LAB — CBC: RBC: 4.77 (ref 3.87–5.11)

## 2023-02-25 ENCOUNTER — Other Ambulatory Visit: Payer: Self-pay

## 2023-02-25 ENCOUNTER — Ambulatory Visit: Payer: PPO

## 2023-02-25 ENCOUNTER — Telehealth: Payer: Self-pay | Admitting: Oncology

## 2023-02-25 DIAGNOSIS — E782 Mixed hyperlipidemia: Secondary | ICD-10-CM

## 2023-02-25 MED ORDER — EZETIMIBE 10 MG PO TABS
10.0000 mg | ORAL_TABLET | Freq: Every day | ORAL | 1 refills | Status: DC
Start: 2023-02-25 — End: 2023-10-27

## 2023-02-25 NOTE — Telephone Encounter (Signed)
02/25/23 LVM next appt on 08/26/23@415am 

## 2023-02-26 ENCOUNTER — Other Ambulatory Visit (HOSPITAL_COMMUNITY): Payer: Self-pay

## 2023-02-26 MED ORDER — DAPAGLIFLOZIN PROPANEDIOL 10 MG PO TABS: 10.0000 mg | ORAL_TABLET | Freq: Every day | ORAL | 3 refills | Status: DC

## 2023-03-03 ENCOUNTER — Telehealth: Payer: Self-pay

## 2023-03-03 NOTE — Progress Notes (Signed)
Care Management & Coordination Services Pharmacy Team  Reason for Encounter: Appointment Reminder  Contacted patient to confirm telephone appointment with [name], PharmD on 03/05/23 at 10:00 am.  Unsuccessful outreach. Left voicemail for patient to return call.   Chart review:  Recent office visits:  01/22/23 [name] LPN. Seen for Medicare Annual Wellness. No med changes.   01/01/23 [name] CMA. Orders Only. Ordered Vascepa 1g.  12/24/22 Cox, Kirsten MD. Seen for routine visit. Started on Omeprazole 20mg  daily. D/C Praluent 150mg .   Recent consult visits:  02/24/23 (Oncology) [name] MD. Seen for follow up. No med changes.   01/20/23 (Cardiology) [name] MD. Message. OK to stop losartan since you are symptomatic and having low BP readings.   01/09/23 (Cardiology) [name] MD. Seen for CHF. Started on Losartan Potassium 25mg  daily.   Hospital visits:  None  Star Rating Drugs:  Medication:  Last Fill: Day Supply Farxiga Losartan (Not taking due to low BP)  Care Gaps: Annual wellness visit in last year? Yes  If Diabetic: Last eye exam / retinopathy screening:10/25/20 Last diabetic foot exam:04/03/21   [name], CMA Clinical Pharmacist Assistant  [phone]

## 2023-03-05 ENCOUNTER — Ambulatory Visit: Payer: PPO

## 2023-03-05 NOTE — Patient Outreach (Signed)
Care Management & Coordination Services Pharmacy Note  03/05/2023 Name:  Allison White MRN:  [id number] DOB:  01-25-1961  Recommendations/Changes made from today's visit: -Patient stopped Rosuvastatin due to muscle aches. Recommend Vit D labs. -Patient taking Hydrocodone/APAP for chronic migraines. States has headaches 4/7 days/week. This constant use can cause rebound headaches. Recommend tapering off Hydrocodone and use of something else -Per Epic, patient is non-compliant on all meds. I called her Pharmacy and spoke with Clydie Braun who states patient has great compliance   Subjective: Allison White is an 62 y.o. year old female who is a primary patient of Cox, Kirsten, MD.  The care coordination team was consulted for assistance with disease management and care coordination needs.    Engaged with patient by telephone for follow up visit.  Recent office visits:  01/22/23 [name] LPN. Seen for Medicare Annual Wellness. No med changes.    01/01/23 [name] CMA. Orders Only. Ordered Vascepa 1g.   12/24/22 Cox, Kirsten MD. Seen for routine visit. Started on Omeprazole 20mg  daily. D/C Praluent 150mg .    Recent consult visits:  02/24/23 (Oncology) [name] MD. Seen for follow up. No med changes.    01/20/23 (Cardiology) [name] MD. Message. OK to stop losartan since you are symptomatic and having low BP readings.    01/09/23 (Cardiology) [name] MD. Seen for CHF. Started on Losartan Potassium 25mg  daily.    Hospital visits:  None   Objective:  Lab Results  Component Value Date   CREATININE 0.85 01/09/2023   BUN 16 01/09/2023   GFR 71.82 07/22/2013   EGFR 69 12/24/2022   GFRNONAA >60 01/09/2023   GFRAA 70 01/01/2021   NA 137 01/09/2023   K 3.3 (L) 01/09/2023   CALCIUM 10.2 01/09/2023   CO2 28 01/09/2023   GLUCOSE 115 (H) 01/09/2023    Lab Results  Component Value Date/Time   HGBA1C 7.0 (H) 12/24/2022 12:07 PM   HGBA1C 6.7 (H) 09/10/2022  08:45 AM   HGBA1C 6.1 10/25/2020 12:00 AM   GFR 71.82 07/22/2013 03:28 PM    Last diabetic Eye exam:  Lab Results  Component Value Date/Time   HMDIABEYEEXA No Retinopathy 10/25/2020 12:00 AM    Last diabetic Foot exam:  Lab Results  Component Value Date/Time   HMDIABFOOTEX Negative 10/25/2020 12:00 AM     Lab Results  Component Value Date   CHOL 228 (H) 12/24/2022   HDL 51 12/24/2022   LDLCALC 111 (H) 12/24/2022   TRIG 383 (H) 12/24/2022   CHOLHDL 4.5 (H) 12/24/2022       Latest Ref Rng & Units 12/24/2022   12:07 PM 09/10/2022    8:45 AM 03/05/2022   10:40 AM  Hepatic Function  Total Protein 6.0 - 8.5 g/dL 7.5  6.8  7.7   Albumin 3.9 - 4.9 g/dL 4.6  4.2  4.9   AST 0 - 40 IU/L 24  19  21    ALT 0 - 32 IU/L 24  20  19    Alk Phosphatase 44 - 121 IU/L 89  88  92   Total Bilirubin 0.0 - 1.2 mg/dL 0.6  0.5  0.6     Lab Results  Component Value Date/Time   TSH 3.720 12/24/2022 12:07 PM   TSH 2.880 07/17/2020 10:11 AM       Latest Ref Rng & Units 02/24/2023   12:00 AM 12/24/2022   12:07 PM 09/10/2022    8:45 AM  CBC  WBC  6.6  7.5  7.6   Hemoglobin 12.0 - 16.0 15.3     17.8  17.0   Hematocrit 36 - 46 44     50.5  48.7   Platelets 150 - 400 K/uL 233     287  251      This result is from an external source.    No results found for: "VD25OH", "VITAMINB12"  Clinical ASCVD: Yes  The 10-year ASCVD risk score (Arnett DK, et al., 2019) is: 12.1%   Values used to calculate the score:     Age: 72 years     Sex: Female     Is Non-Hispanic African American: No     Diabetic: Yes     Tobacco smoker: No     Systolic Blood Pressure: 137 mmHg     Is BP treated: Yes     HDL Cholesterol: 51 mg/dL     Total Cholesterol: 228 mg/dL    Other: ([id number] if Afib, MMRC or CAT for COPD, ACT, DEXA)     01/23/2023   10:38 AM 12/24/2022   11:15 AM 11/29/2021    1:35 PM  Depression screen PHQ 2/9  Decreased Interest 0 0 0  Down, Depressed, Hopeless 0 0 0  PHQ - 2 Score 0 0 0      Social History   Tobacco Use  Smoking Status Former   Packs/day: 1.00   Years: 6.00   Additional pack years: 0.00   Total pack years: 6.00   Types: Cigarettes  Smokeless Tobacco Never  Tobacco Comments   Quit over 30 years ago   BP Readings from Last 3 Encounters:  02/24/23 137/82  01/22/23 108/80  01/09/23 120/80   Pulse Readings from Last 3 Encounters:  02/24/23 71  01/22/23 67  01/09/23 73   Wt Readings from Last 3 Encounters:  02/24/23 219 lb 8 oz (99.6 kg)  01/22/23 221 lb (100.2 kg)  01/09/23 221 lb 12.8 oz (100.6 kg)   BMI Readings from Last 3 Encounters:  02/24/23 36.53 kg/m  01/22/23 36.78 kg/m  01/09/23 36.91 kg/m    Allergies  Allergen Reactions   Tape Rash    PREFERS CLOTH OR NOTHING   Plavix [Clopidogrel Bisulfate] Other (See Comments)    Joint pain   Praluent [Alirocumab]     Headaches, gastrointestinal issues   Statins     Myalgia, neck pain   Tamoxifen Nausea And Vomiting    Medications Reviewed Today     Reviewed by Ruthell Rummage, CMA (Certified Medical Assistant) on 02/24/23 at 1655  Med List Status: <None>   Medication Order Taking? Sig Documenting Provider Last Dose Status Informant  aspirin 81 MG EC tablet [id number] No Take 81 mg by mouth daily.  [provider] Taking Active Self  dapagliflozin propanediol (FARXIGA) 10 MG TABS tablet [id number] No Take 1 tablet (10 mg total) by mouth daily. Bensimhon, Bevelyn Buckles, MD Taking Active   digoxin (LANOXIN) 0.125 MG tablet [id number] No Take 1 tablet by mouth once daily Bensimhon, Bevelyn Buckles, MD Taking Active   ezetimibe (ZETIA) 10 MG tablet [id number] No Take 1 tablet (10 mg total) by mouth daily. Abigail Miyamoto, MD Taking Active   HYDROcodone-acetaminophen [facility]) 7.5-325 MG tablet [id number] No Take 1 tablet by mouth every 6 (six) hours as needed for moderate pain. Cox, Kirsten, MD Taking Active   ibuprofen (ADVIL) 200 MG tablet [id number] No Take 200 mg by mouth as needed.  [provider] Taking Active   icosapent  Ethyl (VASCEPA) 1 g capsule [id number] No Take 2 capsules (2 g total) by mouth 2 (two) times daily. Cox, Kirsten, MD Taking Active   ivabradine (CORLANOR) 7.5 MG TABS tablet [id number] No Take 1 tablet (7.5 mg total) by mouth 2 (two) times daily with a meal. Bensimhon, Bevelyn Buckles, MD Taking Active   LORATADINE PO [id number] No Take by mouth daily. [facility] brand [provider] Taking Active Self  losartan (COZAAR) 25 MG tablet [id number] No Take 1 tablet (25 mg total) by mouth daily. Bensimhon, Bevelyn Buckles, MD Taking Active   metoprolol succinate (TOPROL-XL) 50 MG 24 hr tablet [id number] No Take 1 tablet by mouth twice daily Bensimhon, Bevelyn Buckles, MD Taking Active   nitroGLYCERIN (NITROSTAT) 0.4 MG SL tablet [id number] No Place 0.4 mg under the tongue every 5 (five) minutes as needed for chest pain. [provider] Taking Active   omeprazole (PRILOSEC) 20 MG capsule [id number] No Take 1 capsule (20 mg total) by mouth daily. Cox, Kirsten, MD Taking Active   Phenazopyridine HCl (AZO TABS PO) [id number] No Take 1 capsule by mouth as needed. [provider] Taking Active   raloxifene (EVISTA) 60 MG tablet [id number] No Take 1 tablet (60 mg total) by mouth daily. Weston Settle, MD Taking Active   spironolactone (ALDACTONE) 25 MG tablet [id number]  Take 1 tablet (25 mg total) by mouth daily. Cox, Fritzi Mandes, MD  Active   torsemide [facility]) 100 MG tablet [id number] No Take 1/2 (one-half) tablet by mouth once daily Bensimhon, Bevelyn Buckles, MD Taking Active             SDOH:  (Social Determinants of Health) assessments and interventions performed: Yes SDOH Interventions    Flowsheet Row Clinical Support from 01/22/2023 in [facility] Office Visit from 12/24/2022 in [facility] Chronic Care Management from 08/20/2022 in [facility] Chronic Care Management from 02/18/2022 in [facility] Chronic Care Management from 08/29/2021 in [facility] Office Visit from 10/26/2020 in [facility]  SDOH Interventions        Food Insecurity Interventions Intervention Not Indicated -- -- -- -- --  Housing Interventions Intervention Not Indicated -- -- -- -- --  Transportation Interventions Intervention Not Indicated -- -- Intervention Not Indicated -- --  Utilities Interventions -- Intervention Not Indicated -- -- -- --  Financial Strain Interventions -- -- Other (Comment)  [PAP] Other (Comment) Other (Comment)  [PAP of some meds] --  Physical Activity Interventions Intervention Not Indicated -- -- -- -- --  Stress Interventions Intervention Not Indicated -- -- -- -- --  Social Connections Interventions -- -- -- -- -- Intervention Not Indicated       Medication Assistance: None required.  Patient affirms current coverage meets needs.  Medication Access: Within the past 30 days, how often has patient missed a dose of medication? Never Is a pillbox or other method used to improve adherence? No  Factors that may affect medication adherence? no barriers identified Are meds synced by current pharmacy? No  Are meds delivered by current pharmacy? No  Does patient experience delays in picking up medications due to transportation concerns? No   [facility] Reviewed: Is patient disadvantaged to use [facility]?: Yes  Current Rx insurance plan: HTA Name and location of Current pharmacy:  [facility] [address] [place] - [address] [place] [address] Phone:  [phone] Fax: [phone]  [facility] [place], [place] - [address] [place] [address] Phone: [phone] Fax: [phone]  [place] [facility] [address] [place] [address] Phone: [phone] Fax: [phone]  Compliance/Adherence/Medication fill  history: Star Rating Drugs:  Medication:                Last Fill:         [facility] Supply Farxiga Losartan (Not taking due to low BP)   Care Gaps: Annual wellness visit in last year? Yes   If Diabetic: Last eye exam / retinopathy screening:10/25/20 Last diabetic foot exam:04/03/21   Assessment/Plan  Hyperlipidemia: (LDL goal < 55) The 10-year ASCVD risk score (Arnett DK, et al., 2019) is: 12.1%   Values used to calculate the score:     Age: 34 years     Sex: Female     Is Non-Hispanic African American: No     Diabetic: Yes     Tobacco smoker: No     Systolic Blood Pressure: 137 mmHg     Is BP treated: Yes     HDL Cholesterol: 51 mg/dL     Total Cholesterol: 228 mg/dL Lab Results  Component Value Date   CHOL 228 (H) 12/24/2022   CHOL 234 (H) 09/10/2022   CHOL 187 03/05/2022   Lab Results  Component Value Date   HDL 51 12/24/2022   HDL 48 09/10/2022   HDL 52 03/05/2022   Lab Results  Component Value Date   LDLCALC 111 (H) 12/24/2022   LDLCALC 133 (H) 09/10/2022   LDLCALC 91 03/05/2022   Lab Results  Component Value Date   TRIG 383 (H) 12/24/2022   TRIG 295 (H) 09/10/2022   TRIG 267 (H) 03/05/2022   Lab Results  Component Value Date   CHOLHDL 4.5 (H) 12/24/2022   CHOLHDL 4.9 (H) 09/10/2022   CHOLHDL 3.6 03/05/2022   No results found for: "LDLDIRECT" Last vitamin D No results found for: "25OHVITD2", "25OHVITD3", "VD25OH" Lab Results  Component Value Date   TSH 3.720 12/24/2022  -Not ideally controlled -Current treatment: Aspirin ec 81 mg daily Appropriate, Effective, Safe, Accessible Ezetimibe 10mg  Appropriate, Query effective,  Vascepa 1g 2BID Appropriate, Query effective -Medications previously tried: Atorvastatin (Muscle aches), Rosuvastatin (Muscle aches) -Current dietary patterns: little appetite but patient is trying to eat healthy -Current exercise habits: limited -Educated on Cholesterol goals;  Benefits of statin for ASCVD risk  reduction; Exercise goal of 150 minutes per week; March 2023: Patient stopped Rosuvastatin due to muscle aches. Recommend repeat thyroid and Vit D April: Recommend Vit D labs since patient stopped Rosuvastatin due to muscle aches   Heart Failure (Goal: manage symptoms and prevent exacerbations) BP Readings from Last 3 Encounters:  02/24/23 137/82  01/22/23 108/80  01/09/23 120/80    Pulse Readings from Last 3 Encounters:  02/24/23 71  01/22/23 67  01/09/23 73  -Controlled -Last ejection fraction: 25%  (Date: 10/2020) -HF type: Systolic -Class: III (marked limitation of activity) B -Current treatment: Farxiga 10 mg daily Appropriate, Effective, Safe, Accessible PAP by Cardio Digoxin 0.125 mg daily Appropriate, Effective, Safe, Accessible Ivabradine 7.5 mg bid with a meal Appropriate, Effective, Safe, Accessible PAP by Cardio Metoprolol Succinate 50 mg bid Appropriate, Effective, Safe, Accessible Nitroglcyerin 0.4 mg every 5 minutes prn chest pain Appropriate, Effective, Safe, Accessible Torsemide 50 mg daily Appropriate, Effective, Safe, Accessible Spironolactone 25 mg daily Appropriate, Effective, Safe, Accessible -Medications  previously tried: Entresto, valsartan, Losartan (Hypotension) -Current home BP/HR readings:  April 2024: Average: 120's/80's Doesn't write them down -Current dietary habits: very little appetite -Current exercise habits: limited  -Educated on Benefits of medications for managing symptoms and prolonging life Importance of weighing daily; if you gain more than 3 pounds in one day or 5 pounds in one week,  -Recommended to continue current medication   Migraine (Goal: prevent/manage migraines) -Not ideally controlled -Current treatment  Hydrocodone 7.5/325 mg every 6 hours prn Query Appropriate,  -Medications previously tried: Ajovy, botox March 2023: Main priority for this patient today was statin, would like to get off CS for headache management in  the future October 2023; Patient states Ajovy stopped working so she stopped taking it. Has headaches every day. Asked when the last time she spoke with Neuro was. She states over a year ago. Counseled it'd be a good idea to go back but she doesn't think there's anything they can do so she doesn't want to re-schedule. Could be rebound headaches from Hydrocodone April 2024: Patient taking Hydrocodone/APAP for chronic migraines. States has headaches 4/7 days/week. This constant use can cause rebound headaches. Recommend tapering off Hydrocodone and use of something else   Breast Cancer -Managed by [name] -Lumpectomy September 2022 -Current treatment  Raloxifene 60mg  Appropriate, Effective, Safe, Accessible Started 02/19/22 -Tried/Failed: Tamoxifen 20mg  (Stopped November 2022 due to [facility]) -Radiation starting mid October 2022 -Recommended to continue current medication  CP F/U October 2024  [name], Pharm.D. - [phone]

## 2023-03-06 ENCOUNTER — Other Ambulatory Visit: Payer: Self-pay | Admitting: Internal Medicine

## 2023-03-06 ENCOUNTER — Other Ambulatory Visit: Payer: Self-pay | Admitting: Family Medicine

## 2023-03-06 DIAGNOSIS — I773 Arterial fibromuscular dysplasia: Secondary | ICD-10-CM

## 2023-03-07 MED ORDER — HYDROCODONE-ACETAMINOPHEN 7.5-325 MG PO TABS
1.0000 | ORAL_TABLET | Freq: Four times a day (QID) | ORAL | 0 refills | Status: DC | PRN
Start: 2023-03-07 — End: 2023-03-27

## 2023-03-13 ENCOUNTER — Ambulatory Visit (INDEPENDENT_AMBULATORY_CARE_PROVIDER_SITE_OTHER): Payer: PPO

## 2023-03-13 DIAGNOSIS — I420 Dilated cardiomyopathy: Secondary | ICD-10-CM | POA: Diagnosis not present

## 2023-03-13 LAB — CUP PACEART REMOTE DEVICE CHECK
Battery Voltage: 48
Date Time Interrogation Session: 20240425070121
Implantable Lead Connection Status: 753985
Implantable Lead Connection Status: 753985
Implantable Lead Implant Date: 20180410
Implantable Lead Implant Date: 20180410
Implantable Lead Location: 753859
Implantable Lead Location: 753860
Implantable Lead Model: 377
Implantable Lead Model: 402266
Implantable Lead Serial Number: 49794726
Implantable Lead Serial Number: 49838890
Implantable Pulse Generator Implant Date: 20180410
Pulse Gen Model: 404622
Pulse Gen Serial Number: 60982098

## 2023-03-17 ENCOUNTER — Encounter: Payer: Self-pay | Admitting: *Deleted

## 2023-03-19 ENCOUNTER — Ambulatory Visit: Payer: HMO | Admitting: Family Medicine

## 2023-03-27 ENCOUNTER — Ambulatory Visit (INDEPENDENT_AMBULATORY_CARE_PROVIDER_SITE_OTHER): Payer: PPO | Admitting: Family Medicine

## 2023-03-27 VITALS — BP 110/78 | HR 68 | Temp 96.0°F | Resp 18 | Ht 65.0 in | Wt 209.0 lb

## 2023-03-27 DIAGNOSIS — R1012 Left upper quadrant pain: Secondary | ICD-10-CM | POA: Diagnosis not present

## 2023-03-27 DIAGNOSIS — T466X5A Adverse effect of antihyperlipidemic and antiarteriosclerotic drugs, initial encounter: Secondary | ICD-10-CM

## 2023-03-27 DIAGNOSIS — I5022 Chronic systolic (congestive) heart failure: Secondary | ICD-10-CM

## 2023-03-27 DIAGNOSIS — C50412 Malignant neoplasm of upper-outer quadrant of left female breast: Secondary | ICD-10-CM

## 2023-03-27 DIAGNOSIS — E1159 Type 2 diabetes mellitus with other circulatory complications: Secondary | ICD-10-CM | POA: Diagnosis not present

## 2023-03-27 DIAGNOSIS — Z170 Estrogen receptor positive status [ER+]: Secondary | ICD-10-CM

## 2023-03-27 DIAGNOSIS — E782 Mixed hyperlipidemia: Secondary | ICD-10-CM | POA: Diagnosis not present

## 2023-03-27 DIAGNOSIS — I25119 Atherosclerotic heart disease of native coronary artery with unspecified angina pectoris: Secondary | ICD-10-CM

## 2023-03-27 DIAGNOSIS — I773 Arterial fibromuscular dysplasia: Secondary | ICD-10-CM

## 2023-03-27 DIAGNOSIS — I110 Hypertensive heart disease with heart failure: Secondary | ICD-10-CM

## 2023-03-27 DIAGNOSIS — M791 Myalgia, unspecified site: Secondary | ICD-10-CM | POA: Diagnosis not present

## 2023-03-27 DIAGNOSIS — I152 Hypertension secondary to endocrine disorders: Secondary | ICD-10-CM | POA: Diagnosis not present

## 2023-03-27 MED ORDER — HYDROCODONE-ACETAMINOPHEN 7.5-325 MG PO TABS
1.0000 | ORAL_TABLET | Freq: Four times a day (QID) | ORAL | 0 refills | Status: DC | PRN
Start: 2023-03-27 — End: 2023-05-01

## 2023-03-27 NOTE — Progress Notes (Signed)
Subjective:  Patient ID: Allison White, female    DOB: [birth year]  Age: 62 y.o. MRN: [id number]  Chief Complaint  Patient presents with   Medical Management of Chronic Issues    HPI Diabetes: continue farxiga 10 mg daily. Does not check her sugars.   History of carotid artery dissection. Will be having echo this month. Followed by Bensimhon and Dr. Elberta Fortis. Has been seen at the [facility] clinic. Hx of breast cancer-lumpectomy-see's Dr. Melvyn Neth. Has seen Neurology and Neurosurgeon. Pseudoaneurysm.   CHF: taking torsemide 100 mg 1/2 tablet daily, spironolactone 25 mg daily, Digoxin 0.125 mg daily, ivabradine 7.5 g twice daily, farxiga 10 mg daily  Hyperlipidemia: currently taking zetia 10 mg daily and maintaining a low cholesterol diet. Intolerant to statins/praluent.  Hypertension: taking metoprolol XL 50 mg BID  Headaches- takes vicodin as needed  Breast cancer: on evista.   GERD: on omeprazole 20 g daily.      03/27/2023   11:03 AM 01/23/2023   10:38 AM 12/24/2022   11:15 AM 11/29/2021    1:35 PM 08/03/2021    9:08 AM  Depression screen PHQ 2/9  Decreased Interest 2 0 0 0 0  Down, Depressed, Hopeless 0 0 0 0 0  PHQ - 2 Score 2 0 0 0 0  Altered sleeping 2      Tired, decreased energy 2      Change in appetite 2      Feeling bad or failure about yourself  0      Trouble concentrating 0      Moving slowly or fidgety/restless 1      Suicidal thoughts 0      PHQ-9 Score 9      Difficult doing work/chores Somewhat difficult            03/27/2023   11:02 AM  Fall Risk   Falls in the past year? 0  Number falls in past yr: 0  Injury with Fall? 0  Risk for fall due to : No Fall Risks  Follow up Falls evaluation completed;Falls prevention discussed    Patient Care Team: Blane Ohara, MD as PCP - General (Family Medicine) Regan Lemming, MD as PCP - Electrophysiology (Cardiology) Bensimhon, Bevelyn Buckles, MD as PCP - Advanced Heart Failure (Cardiology) Weston Settle, MD as  Consulting Physician (Oncology) Lance Bosch, MD as Consulting Physician (Radiation Oncology) Zettie Pho, [facility] (Pharmacist) Julieanne Cotton, MD as Consulting Physician (Interventional Radiology) Weston Settle, MD as Consulting Physician (Oncology)   Review of Systems  Constitutional:  Negative for chills, fatigue and fever.  HENT:  Negative for congestion, rhinorrhea and sore throat.   Respiratory:  Negative for cough and shortness of breath.   Cardiovascular:  Positive for palpitations. Negative for chest pain.  Gastrointestinal:  Positive for abdominal pain (LUQ pain w/radiating pain into back and shoulder), diarrhea, nausea and vomiting. Negative for constipation.  Genitourinary:  Negative for dysuria and urgency.  Musculoskeletal:  Positive for arthralgias (left ankle pain). Negative for back pain and myalgias.  Neurological:  Positive for dizziness and headaches. Negative for weakness and light-headedness.  Psychiatric/Behavioral:  Negative for dysphoric mood. The patient is not nervous/anxious.     Current Outpatient Medications on File Prior to Visit  Medication Sig Dispense Refill   magnesium 30 MG tablet Take 30 mg by mouth 2 (two) times daily. Taking magnesium/tumeric and Vitamin D3 combination daily.     aspirin 81 MG EC tablet Take 81 mg by mouth daily.  dapagliflozin propanediol (FARXIGA) 10 MG TABS tablet Take 1 tablet (10 mg total) by mouth daily. 90 tablet 3   digoxin (LANOXIN) 0.125 MG tablet Take 1 tablet by mouth once daily 90 tablet 0   ezetimibe (ZETIA) 10 MG tablet Take 1 tablet (10 mg total) by mouth daily. 90 tablet 1   ibuprofen (ADVIL) 200 MG tablet Take 200 mg by mouth as needed.     icosapent Ethyl (VASCEPA) 1 g capsule Take 2 capsules (2 g total) by mouth 2 (two) times daily. 120 capsule 2   ivabradine (CORLANOR) 7.5 MG TABS tablet Take 1 tablet (7.5 mg total) by mouth 2 (two) times daily with a meal. 180 tablet 3   LORATADINE PO Take by  mouth daily. [facility] brand     metoprolol succinate (TOPROL-XL) 50 MG 24 hr tablet Take 1 tablet by mouth twice daily 180 tablet 3   nitroGLYCERIN (NITROSTAT) 0.4 MG SL tablet Place 0.4 mg under the tongue every 5 (five) minutes as needed for chest pain.     omeprazole (PRILOSEC) 20 MG capsule Take 1 capsule (20 mg total) by mouth daily. 90 capsule 1   Phenazopyridine HCl (AZO TABS PO) Take 1 capsule by mouth as needed.     raloxifene (EVISTA) 60 MG tablet Take 1 tablet (60 mg total) by mouth daily. 90 tablet 3   spironolactone (ALDACTONE) 25 MG tablet Take 1 tablet (25 mg total) by mouth daily. 90 tablet 2   torsemide (DEMADEX) 100 MG tablet Take 1/2 (one-half) tablet by mouth once daily 45 tablet 3   No current facility-administered medications on file prior to visit.   Past Medical History:  Diagnosis Date   Cancer (HCC) 2022   Rt Breast   CHF (congestive heart failure) (HCC)    FMD (facioscapulohumeral muscular dystrophy) (HCC)    Headache(784.0)    Heart failure (HCC)    Hyperlipidemia    MI (myocardial infarction) (HCC)    Migraine    Nonruptured cerebral aneurysm, internal carotid artery    Left side, stent placement (2009)   Pseudoaneurysm (HCC)    both carotids    Vertigo    Past Surgical History:  Procedure Laterality Date   ABDOMINAL HYSTERECTOMY  08/16/2004   Uterus and Cervix (per pathology)   BREAST LUMPECTOMY Right 07/2021   CARDIAC DEFIBRILLATOR PLACEMENT     CEREBRAL ANEURYSM REPAIR Left 2009   CESAREAN SECTION     CORONARY ANGIOPLASTY WITH STENT PLACEMENT     PACEMAKER IMPLANT     RIGHT/LEFT HEART CATH AND CORONARY ANGIOGRAPHY N/A 04/07/2020   Procedure: RIGHT/LEFT HEART CATH AND CORONARY ANGIOGRAPHY;  Surgeon: Dolores Patty, MD;  Location: [facility];  Service: Cardiovascular;  Laterality: N/A;   TUBAL LIGATION      Family History  Problem Relation Age of Onset   Thyroid disease Mother    Gastric cancer Mother 26       w/ signet ring  features   Prostate cancer Father        d. 15   Hypertension Brother    Breast cancer Maternal Aunt 50   Brain cancer Maternal Aunt 67   Prostate cancer Paternal Uncle        x2 pat uncles   Prostate cancer Maternal Grandfather        metastatic; d. 44   Cerebral aneurysm Paternal Grandmother        Nonruptured   Dementia Paternal Grandfather    Prostate cancer Paternal Grandfather  metastatic   Social History   Socioeconomic History   Marital status: Married    Spouse name: Tim   Number of children: 4   Years of education: Not on file   Highest education level: High school graduate  Occupational History   Not on file  Tobacco Use   Smoking status: Former    Packs/day: 1.00    Years: 6.00    Additional pack years: 0.00    Total pack years: 6.00    Types: Cigarettes   Smokeless tobacco: Never   Tobacco comments:    Quit over 30 years ago  Vaping Use   Vaping Use: Never used  Substance and Sexual Activity   Alcohol use: Yes    Comment: Occasionally "little"   Drug use: No   Sexual activity: Not Currently  Other Topics Concern   Not on file  Social History Narrative   Lives with husband in a one-story home.     Right handed   Caffeine: "very little"   Social Determinants of Health   Financial Resource Strain: Low Risk  (03/05/2023)   Overall Financial Resource Strain (CARDIA)    Difficulty of Paying Living Expenses: Not hard at all  Recent Concern: Financial Resource Strain - Medium Risk (01/23/2023)   Overall Financial Resource Strain (CARDIA)    Difficulty of Paying Living Expenses: Somewhat hard  Food Insecurity: No Food Insecurity (01/23/2023)   Hunger Vital Sign    Worried About Running Out of Food in the Last Year: Never true    Ran Out of Food in the Last Year: Never true  Transportation Needs: No Transportation Needs (03/05/2023)   PRAPARE - Administrator, Civil Service (Medical): No    Lack of Transportation (Non-Medical): No   Physical Activity: Sufficiently Active (01/23/2023)   Exercise Vital Sign    Days of Exercise per Week: 4 days    Minutes of Exercise per Session: 90 min  Stress: No Stress Concern Present (01/23/2023)   [employer] of Occupational Health - Occupational Stress Questionnaire    Feeling of Stress : Not at all  Social Connections: Moderately Integrated (10/26/2020)   Social Connection and Isolation Panel [NHANES]    Frequency of Communication with Friends and Family: More than three times a week    Frequency of Social Gatherings with Friends and Family: Twice a week    Attends Religious Services: 1 to 4 times per year    Active Member of [employer] or Organizations: No    Attends Engineer, structural: Never    Marital Status: Married    Objective:  BP 110/78   Pulse 68   Temp (!) 96 F (35.6 C)   Resp 18   Ht 5\' 5"  (1.651 m)   Wt 209 lb (94.8 kg)   BMI 34.78 kg/m      03/27/2023   10:57 AM 02/24/2023    4:53 PM 01/22/2023    3:00 PM  BP/Weight  Systolic BP 110 137 108  Diastolic BP 78 82 80  Wt. (Lbs) 209 219.5 221  BMI 34.78 kg/m2 36.53 kg/m2 36.78 kg/m2    Physical Exam Vitals reviewed.  Constitutional:      Appearance: Normal appearance. She is normal weight.  Neck:     Vascular: No carotid bruit.  Cardiovascular:     Rate and Rhythm: Normal rate and regular rhythm.     Pulses: Normal pulses.     Heart sounds: Normal heart sounds.  Pulmonary:  Effort: Pulmonary effort is normal. No respiratory distress.     Breath sounds: Normal breath sounds.  Abdominal:     General: Abdomen is flat. Bowel sounds are normal.     Palpations: Abdomen is soft.     Tenderness: There is abdominal tenderness (epigastric and LUQ. Left lower thoracic region mildly tender.).  Musculoskeletal:     Comments: Left ankle pain and swelling  Crepitus left ankle.  Neurological:     Mental Status: She is alert and oriented to person, place, and time.  Psychiatric:        Mood and  Affect: Mood normal.        Behavior: Behavior normal.     Diabetic Foot Exam - Simple   Simple Foot Form Diabetic Foot exam was performed with the following findings: Yes 03/27/2023  8:14 PM  Visual Inspection No deformities, no ulcerations, no other skin breakdown bilaterally: Yes Sensation Testing Intact to touch and monofilament testing bilaterally: Yes Pulse Check Posterior Tibialis and Dorsalis pulse intact bilaterally: Yes Comments      Lab Results  Component Value Date   WBC 8.7 03/27/2023   HGB 17.6 (H) 03/27/2023   HCT 51.8 (H) 03/27/2023   PLT 333 03/27/2023   GLUCOSE 153 (H) 03/27/2023   CHOL 286 (H) 03/27/2023   TRIG 278 (H) 03/27/2023   HDL 42 03/27/2023   LDLCALC 190 (H) 03/27/2023   ALT 26 03/27/2023   AST 22 03/27/2023   NA 136 03/27/2023   K 3.7 03/27/2023   CL 87 (L) 03/27/2023   CREATININE 1.08 (H) 03/27/2023   BUN 18 03/27/2023   CO2 26 03/27/2023   TSH 3.720 12/24/2022   INR 1.0 04/04/2020   HGBA1C 6.9 (H) 03/27/2023      Assessment & Plan:    Coronary artery disease involving native coronary artery of native heart with angina pectoris (HCC)  Mixed hyperlipidemia Assessment & Plan: Well controlled.  No changes to medicines. Continue zetia 10 mg daily.  Continue to work on eating a healthy diet and exercise.  Labs drawn today.    Orders: -     Comprehensive metabolic panel -     Lipid panel  Chronic systolic congestive heart failure (HCC) Assessment & Plan: Management for specialist. Continue metoprolol xl 50 mg twice daily, torsemide 100 mg 1/2 tablet daily, spironolactone 25 mg daily, Digoxin 0.125 mg daily, ivabradine 7.5 g twice daily, farxiga 10 mg daily.   Hypertension associated with diabetes (HCC) Assessment & Plan: Control: good Recommend check feet daily. Recommend annual eye exams. Medicines: continue farxiga 10 mg daily  Continue to work on eating a healthy diet and exercise.  Labs drawn today.     Orders: -      CBC with Differential/Platelet -     Hemoglobin A1c  Fibromuscular dysplasia (HCC) Assessment & Plan: Management per specialist.    Orders: -     HYDROcodone-Acetaminophen; Take 1 tablet by mouth every 6 (six) hours as needed for moderate pain.  Dispense: 30 tablet; Refill: 0  Malignant neoplasm of upper-outer quadrant of left breast in female, estrogen receptor positive (HCC) Assessment & Plan: Management per specialist.  Continue raloxifene.   Left upper quadrant abdominal pain -     Helicobacter Pylori Special Antigen, Stool -     Amylase -     Lipase  Myalgia due to statin Assessment & Plan: Intolerant to statins.  Intolerant to praluent.       Meds ordered this encounter  Medications  HYDROcodone-acetaminophen (NORCO) 7.5-325 MG tablet    Sig: Take 1 tablet by mouth every 6 (six) hours as needed for moderate pain.    Dispense:  30 tablet    Refill:  0    Patient needs to discuss this with her oncologist    Orders Placed This Encounter  Procedures   CBC with Differential/Platelet   Comprehensive metabolic panel   Hemoglobin A1c   Lipid panel   Helicobacter Pylori Special Antigen, Stool   Amylase   Lipase     Follow-up: Return in about 3 months (around 06/27/2023) for chronic fasting.   I,Carolyn M Morrison,acting as a Neurosurgeon for Blane Ohara, MD.,have documented all relevant documentation on the behalf of Blane Ohara, MD,as directed by  Blane Ohara, MD while in the presence of Blane Ohara, MD.   An After Visit Summary was printed and given to the patient.  Blane Ohara, MD [facility] [phone]

## 2023-03-27 NOTE — Patient Instructions (Signed)
Based on lab results will recommend next testing.

## 2023-04-01 LAB — LIPID PANEL
Chol/HDL Ratio: 6.8 ratio — ABNORMAL HIGH (ref 0.0–4.4)
Cholesterol, Total: 286 mg/dL — ABNORMAL HIGH (ref 100–199)
HDL: 42 mg/dL (ref >39)
LDL Chol Calc (NIH): 190 mg/dL — ABNORMAL HIGH (ref 0–99)
Triglycerides: 278 mg/dL — ABNORMAL HIGH (ref 0–149)
VLDL Cholesterol Cal: 54 mg/dL — ABNORMAL HIGH (ref 5–40)

## 2023-04-01 LAB — COMPREHENSIVE METABOLIC PANEL
ALT: 26 IU/L (ref 0–32)
AST: 22 IU/L (ref 0–40)
Albumin/Globulin Ratio: 1.5 (ref 1.2–2.2)
Albumin: 4.4 g/dL (ref 3.9–4.9)
Alkaline Phosphatase: 117 IU/L (ref 44–121)
BUN/Creatinine Ratio: 17 (ref 12–28)
BUN: 18 mg/dL (ref 8–27)
Bilirubin Total: 0.6 mg/dL (ref 0.0–1.2)
CO2: 26 mmol/L (ref 20–29)
Calcium: 10.5 mg/dL — ABNORMAL HIGH (ref 8.7–10.3)
Chloride: 87 mmol/L — ABNORMAL LOW (ref 96–106)
Creatinine, Ser: 1.08 mg/dL — ABNORMAL HIGH (ref 0.57–1.00)
Globulin, Total: 2.9 g/dL (ref 1.5–4.5)
Glucose: 153 mg/dL — ABNORMAL HIGH (ref 70–99)
Potassium: 3.7 mmol/L (ref 3.5–5.2)
Sodium: 136 mmol/L (ref 134–144)
Total Protein: 7.3 g/dL (ref 6.0–8.5)
eGFR: 58 mL/min/{1.73_m2} — ABNORMAL LOW (ref >59)

## 2023-04-01 LAB — CBC WITH DIFFERENTIAL/PLATELET
Basophils Absolute: 0.1 10*3/uL (ref 0.0–0.2)
Basos: 1 %
EOS (ABSOLUTE): 0.2 10*3/uL (ref 0.0–0.4)
Eos: 2 %
Hematocrit: 51.8 % — ABNORMAL HIGH (ref 34.0–46.6)
Hemoglobin: 17.6 g/dL — ABNORMAL HIGH (ref 11.1–15.9)
Immature Grans (Abs): 0.1 10*3/uL (ref 0.0–0.1)
Immature Granulocytes: 1 %
Lymphocytes Absolute: 1.7 10*3/uL (ref 0.7–3.1)
Lymphs: 20 %
MCH: 31.5 pg (ref 26.6–33.0)
MCHC: 34 g/dL (ref 31.5–35.7)
MCV: 93 fL (ref 79–97)
Monocytes Absolute: 0.7 10*3/uL (ref 0.1–0.9)
Monocytes: 8 %
Neutrophils Absolute: 6 10*3/uL (ref 1.4–7.0)
Neutrophils: 68 %
Platelets: 333 10*3/uL (ref 150–450)
RBC: 5.58 x10E6/uL — ABNORMAL HIGH (ref 3.77–5.28)
RDW: 12.1 % (ref 11.7–15.4)
WBC: 8.7 10*3/uL (ref 3.4–10.8)

## 2023-04-01 LAB — CARDIOVASCULAR RISK ASSESSMENT

## 2023-04-01 LAB — LIPASE: Lipase: 37 U/L (ref 14–72)

## 2023-04-01 LAB — AMYLASE: Amylase: 44 U/L (ref 31–110)

## 2023-04-01 LAB — HEMOGLOBIN A1C
Est. average glucose Bld gHb Est-mCnc: 151 mg/dL
Hgb A1c MFr Bld: 6.9 % — ABNORMAL HIGH (ref 4.8–5.6)

## 2023-04-06 ENCOUNTER — Encounter: Payer: Self-pay | Admitting: Family Medicine

## 2023-04-06 DIAGNOSIS — R1012 Left upper quadrant pain: Secondary | ICD-10-CM | POA: Insufficient documentation

## 2023-04-06 NOTE — Assessment & Plan Note (Signed)
Well controlled.  ?No changes to medicines. Continue zetia 10 mg daily.  ?Continue to work on eating a healthy diet and exercise.  ?Labs drawn today.  ? ?

## 2023-04-06 NOTE — Assessment & Plan Note (Signed)
Management per specialist. 

## 2023-04-06 NOTE — Assessment & Plan Note (Signed)
Control: good Recommend check feet daily. Recommend annual eye exams. Medicines: continue farxiga 10 mg daily  Continue to work on eating a healthy diet and exercise.  Labs drawn today.

## 2023-04-06 NOTE — Assessment & Plan Note (Signed)
Management per specialist.  Continue raloxifene.

## 2023-04-06 NOTE — Assessment & Plan Note (Signed)
Management for specialist. Continue metoprolol xl 50 mg twice daily, torsemide 100 mg 1/2 tablet daily, spironolactone 25 mg daily, Digoxin 0.125 mg daily, ivabradine 7.5 g twice daily, farxiga 10 mg daily.

## 2023-04-06 NOTE — Assessment & Plan Note (Signed)
Check labs 

## 2023-04-06 NOTE — Assessment & Plan Note (Signed)
Intolerant to statins.  Intolerant to praluent.

## 2023-04-08 NOTE — Progress Notes (Signed)
Remote ICD transmission.   

## 2023-05-01 ENCOUNTER — Other Ambulatory Visit: Payer: Self-pay | Admitting: Family Medicine

## 2023-05-01 ENCOUNTER — Other Ambulatory Visit (HOSPITAL_COMMUNITY): Payer: Self-pay | Admitting: Internal Medicine

## 2023-05-01 DIAGNOSIS — I773 Arterial fibromuscular dysplasia: Secondary | ICD-10-CM

## 2023-05-01 DIAGNOSIS — I25119 Atherosclerotic heart disease of native coronary artery with unspecified angina pectoris: Secondary | ICD-10-CM

## 2023-05-01 MED ORDER — HYDROCODONE-ACETAMINOPHEN 7.5-325 MG PO TABS
1.00 | ORAL_TABLET | Freq: Four times a day (QID) | ORAL | 0 refills | Status: DC | PRN
Start: 2023-05-01 — End: 2023-05-26

## 2023-05-01 NOTE — Telephone Encounter (Signed)
Per Dr cox last refill she is supposed to discuss the dosing of pain med with her oncologist

## 2023-05-01 NOTE — Telephone Encounter (Signed)
Patient is taking pain medication for migranes.  She has been taking this medication for years.

## 2023-05-01 NOTE — Telephone Encounter (Signed)
Refill to be sent to the pharmacy.

## 2023-05-22 ENCOUNTER — Other Ambulatory Visit: Payer: Self-pay | Admitting: Internal Medicine

## 2023-05-26 ENCOUNTER — Other Ambulatory Visit: Payer: Self-pay | Admitting: Family Medicine

## 2023-05-26 ENCOUNTER — Other Ambulatory Visit: Payer: Self-pay | Admitting: Physician Assistant

## 2023-05-26 DIAGNOSIS — I25119 Atherosclerotic heart disease of native coronary artery with unspecified angina pectoris: Secondary | ICD-10-CM

## 2023-05-26 DIAGNOSIS — I773 Arterial fibromuscular dysplasia: Secondary | ICD-10-CM

## 2023-05-26 MED ORDER — ICOSAPENT ETHYL 1 G PO CAPS
2.0000 g | ORAL_CAPSULE | Freq: Two times a day (BID) | ORAL | 0 refills | Status: DC
Start: 2023-05-26 — End: 2023-07-07

## 2023-05-27 ENCOUNTER — Other Ambulatory Visit: Payer: Self-pay

## 2023-05-27 MED ORDER — HYDROCODONE-ACETAMINOPHEN 7.5-325 MG PO TABS
1.0000 | ORAL_TABLET | Freq: Four times a day (QID) | ORAL | 0 refills | Status: DC | PRN
Start: 2023-05-27 — End: 2023-06-20

## 2023-06-11 LAB — HM MAMMOGRAPHY

## 2023-06-12 ENCOUNTER — Ambulatory Visit (INDEPENDENT_AMBULATORY_CARE_PROVIDER_SITE_OTHER): Payer: PPO

## 2023-06-12 DIAGNOSIS — R92323 Mammographic fibroglandular density, bilateral breasts: Secondary | ICD-10-CM | POA: Diagnosis not present

## 2023-06-12 DIAGNOSIS — I420 Dilated cardiomyopathy: Secondary | ICD-10-CM

## 2023-06-12 DIAGNOSIS — Z853 Personal history of malignant neoplasm of breast: Secondary | ICD-10-CM | POA: Diagnosis not present

## 2023-06-13 LAB — CUP PACEART REMOTE DEVICE CHECK
Battery Voltage: 44
Date Time Interrogation Session: 20240726094901
Implantable Lead Connection Status: 753985
Implantable Lead Connection Status: 753985
Implantable Lead Implant Date: 20180410
Implantable Lead Implant Date: 20180410
Implantable Lead Location: 753859
Implantable Lead Location: 753860
Implantable Lead Model: 377
Implantable Lead Model: 402266
Implantable Lead Serial Number: 49794726
Implantable Lead Serial Number: 49838890
Implantable Pulse Generator Implant Date: 20180410
Pulse Gen Model: 404622
Pulse Gen Serial Number: 60982098

## 2023-06-18 DIAGNOSIS — D0511 Intraductal carcinoma in situ of right breast: Secondary | ICD-10-CM | POA: Diagnosis not present

## 2023-06-20 ENCOUNTER — Other Ambulatory Visit: Payer: Self-pay | Admitting: Family Medicine

## 2023-06-20 DIAGNOSIS — I773 Arterial fibromuscular dysplasia: Secondary | ICD-10-CM

## 2023-06-20 MED ORDER — HYDROCODONE-ACETAMINOPHEN 7.5-325 MG PO TABS
1.0000 | ORAL_TABLET | Freq: Four times a day (QID) | ORAL | 0 refills | Status: DC | PRN
Start: 2023-06-20 — End: 2023-07-24

## 2023-06-21 ENCOUNTER — Other Ambulatory Visit: Payer: Self-pay | Admitting: Family Medicine

## 2023-06-21 DIAGNOSIS — K219 Gastro-esophageal reflux disease without esophagitis: Secondary | ICD-10-CM

## 2023-06-24 NOTE — Progress Notes (Signed)
Remote ICD transmission.   

## 2023-07-06 ENCOUNTER — Other Ambulatory Visit: Payer: Self-pay | Admitting: Family Medicine

## 2023-07-06 DIAGNOSIS — I25119 Atherosclerotic heart disease of native coronary artery with unspecified angina pectoris: Secondary | ICD-10-CM

## 2023-07-06 NOTE — Progress Notes (Incomplete)
Advanced Heart Failure Clinic Note  Date:  07/08/2023   ID:  Wilburta Hussain, DOB 29-Jan-1961, MRN [id number]  Location: Home  Provider location: [place] [facility] Type of Visit: Established patient  PCP:  Blane Ohara, MD  Cardiologist:  None Primary HF: Lashunda Greis  Chief Complaint: Heart Failure follow-up   History of Present Illness:  Franklin Mcneish is a 62 y.o. female with morbid obesity, HTN, carotid artery dissection with stenting of left carotid due to possible FMD, CAD s/p anterior MI 1/18 (DES to LAD), systolic HF with EF 20-25% s/p Biotronik ICD.   Seen in 5/21 with worsening NYHA IIIb symptoms. Had R/L cath with normal coronary arteries; widely patent LAD stent, EF 20-25%, Normal hemodynamics with CI 3.4. -> CPX  = pVO2: 15.3 (90% predicted pVO2) - adjusted to iBW 25.72ml/kg/min VE/VCO2 slope:  33   Seen in HF clinic 01/2021 with orthostasis. Reds 30%. Recommended ted hose. Sleep study deferred at patient/husband request.   Had lumpectomy 9/22 for abnormal mammogram. Finished 6 weeks of XRT in 12/22. No chemo.   Echo 11/22 EF 25-30% G1DD  RV ok   Echo 01/09/23: EF 20-25% RV ok G2 DD mod MR Personally reviewed  Here for routine f/u. At last visit losartan started (BP previously had been too low). Says she is more fatigued this summer than last summer. Not doing her bike riding as much. Thinks it is the heat. Otherwise doing well. No problems with ADLs. Compliant with all meds. Tolerating losartan.    Cardiac Studies Echo 06/2017 EF 20-25% Echo 02/2018 LVEF 25-30%, Trivial MR, Normal RV, Mild TR, PA peak pressure 26 mm Hg Echo 8/20  EF 20-25% RV ok. Echo 12/21 EF 25%   CPX 8/21 FVC 3.08 (91%)      FEV1 2.30 (87%%)        FEV1/FVC 75 (94%)        MVV 89 (94%)  RER 1.10 Peak VO2: 15.3 (90% predicted peak VO2) - adjusted to iBW 25.38ml/kg/min VE/VCO2 slope:  33   Studies: R/L cath 04/07/20 Ao = 102/56 (75) LV = 104/13 RA = 3 RV = 27/4 PA =  28/9 (17) PCW = 7 Fick cardiac output/index = 7.2/3.4 PVR = 1.4 WU FA sat = 99% PA sat = 79%, 81% High SVC sat = 81%  Monitor 10/19 1. Sinus rhythm 2. Rare PVCs and bigeminy (< 1.0%) 3. Two patient-triggered events associated with isolated PVCs.  4. No high-grade arrhythmias     CPX 03/17/18 FVC 3.00 (87%), FEV1 2.97(88%), FEV1/FVC 79 (99%)  Peak VO2: 13.8 - When adjusted to the patient's ideal body weight of 142.2 lb (64.5 kg) the peak VO2 is 22.6 ml/kg VE/VCO2 slope: 38 OUES: 1.52 Peak RER: 1.11 Ventilatory Threshold: 11.8 VE/MVV:  95% O2pulse:  8 Interpretation: Mild limitation due to HF and obesity    Past Medical History:  Diagnosis Date   Cancer (HCC) 2022   Rt Breast   CHF (congestive heart failure) (HCC)    FMD (facioscapulohumeral muscular dystrophy) (HCC)    Headache(784.0)    Heart failure (HCC)    Hyperlipidemia    MI (myocardial infarction) (HCC)    Migraine    Nonruptured cerebral aneurysm, internal carotid artery    Left side, stent placement (2009)   Pseudoaneurysm (HCC)    both carotids    Vertigo    Past Surgical History:  Procedure Laterality Date   ABDOMINAL HYSTERECTOMY  08/16/2004   Uterus and Cervix (  per pathology)   BREAST LUMPECTOMY Right 07/2021   CARDIAC DEFIBRILLATOR PLACEMENT     CEREBRAL ANEURYSM REPAIR Left 2009   CESAREAN SECTION     CORONARY ANGIOPLASTY WITH STENT PLACEMENT     PACEMAKER IMPLANT     RIGHT/LEFT HEART CATH AND CORONARY ANGIOGRAPHY N/A 04/07/2020   Procedure: RIGHT/LEFT HEART CATH AND CORONARY ANGIOGRAPHY;  Surgeon: Dolores Patty, MD;  Location: [facility];  Service: Cardiovascular;  Laterality: N/A;   TUBAL LIGATION       Current Outpatient Medications  Medication Sig Dispense Refill   aspirin 81 MG EC tablet Take 81 mg by mouth daily.      dapagliflozin propanediol (FARXIGA) 10 MG TABS tablet Take 1 tablet (10 mg total) by mouth daily. 90 tablet 3   digoxin (LANOXIN) 0.125 MG tablet Take 1  tablet by mouth once daily 90 tablet 3   ezetimibe (ZETIA) 10 MG tablet Take 1 tablet (10 mg total) by mouth daily. 90 tablet 1   HYDROcodone-acetaminophen (NORCO) 7.5-325 MG tablet Take 1 tablet by mouth every 6 (six) hours as needed for moderate pain. 30 tablet 0   ibuprofen (ADVIL) 200 MG tablet Take 200 mg by mouth as needed.     icosapent Ethyl (VASCEPA) 1 g capsule Take 2 capsules by mouth twice daily 120 capsule 0   ivabradine (CORLANOR) 7.5 MG TABS tablet Take 1 tablet (7.5 mg total) by mouth 2 (two) times daily with a meal. 180 tablet 3   LORATADINE PO Take by mouth daily. [facility] brand     metoprolol succinate (TOPROL-XL) 50 MG 24 hr tablet Take 1 tablet by mouth twice daily 180 tablet 3   nitroGLYCERIN (NITROSTAT) 0.4 MG SL tablet Place 0.4 mg under the tongue every 5 (five) minutes as needed for chest pain.     omeprazole (PRILOSEC) 20 MG capsule Take 1 capsule by mouth once daily 90 capsule 0   Phenazopyridine HCl (AZO TABS PO) Take 1 capsule by mouth as needed.     raloxifene (EVISTA) 60 MG tablet Take 1 tablet (60 mg total) by mouth daily. 90 tablet 3   spironolactone (ALDACTONE) 25 MG tablet Take 1 tablet (25 mg total) by mouth daily. 90 tablet 2   torsemide (DEMADEX) 100 MG tablet Take 1/2 (one-half) tablet by mouth once daily 45 tablet 3   No current facility-administered medications for this encounter.    Allergies:   Tape, Plavix [clopidogrel bisulfate], Praluent [alirocumab], Statins, and Tamoxifen   Social History:  The patient  reports that she has quit smoking. Her smoking use included cigarettes. She has a 6 pack-year smoking history. She has never used smokeless tobacco. She reports current alcohol use. She reports that she does not use drugs.   Family History:  The patient's family history includes Brain cancer (age of onset: 92) in her maternal aunt; Breast cancer (age of onset: 56) in her maternal aunt; Cerebral aneurysm in her paternal grandmother; Dementia in  her paternal grandfather; Gastric cancer (age of onset: 27) in her mother; Hypertension in her brother; Prostate cancer in her father, maternal grandfather, paternal grandfather, and paternal uncle; Thyroid disease in her mother.   ROS:  Please see the history of present illness.   All other systems are personally reviewed and negative.   Body mass index is 34.08 kg/m.  Vitals:   07/08/23 0955  BP: (!) 94/50  Pulse: 67  SpO2: 97%  Weight: 92.9 kg (204 lb 12.8 oz)  Wt Readings from Last 3 Encounters:  07/08/23 92.9 kg (204 lb 12.8 oz)  03/27/23 94.8 kg (209 lb)  02/24/23 99.6 kg (219 lb 8 oz)    Exam:   General:  Well appearing. No resp difficulty HEENT: normal Neck: supple. no JVD. Carotids 2+ bilat; no bruits. No lymphadenopathy or thryomegaly appreciated. Cor: PMI nondisplaced. Regular rate & rhythm. No rubs, gallops or murmurs. Lungs: clear Abdomen: soft, nontender, nondistended. No hepatosplenomegaly. No bruits or masses. Good bowel sounds. Extremities: no cyanosis, clubbing, rash, edema Neuro: alert & orientedx3, cranial nerves grossly intact. moves all 4 extremities w/o difficulty. Affect pleasant   Recent Labs: 12/24/2022: TSH 3.720 01/09/2023: B Natriuretic Peptide 97.5 03/27/2023: ALT 26; BUN 18; Creatinine, Ser 1.08; Hemoglobin 17.6; Platelets 333; Potassium 3.7; Sodium 136    Wt Readings from Last 3 Encounters:  07/08/23 92.9 kg (204 lb 12.8 oz)  03/27/23 94.8 kg (209 lb)  02/24/23 99.6 kg (219 lb 8 oz)      ASSESSMENT AND PLAN:  1. Chronic systolic HF, ICM - Echo 02/2018 LVEF 25-30%, Trivial MR, Normal RV, Mild TR, PA peak pressure 26 mm Hg - s/p Biotronik ICD (Followed by Dr. Gerre Pebbles) - Echo 07/13/19 EF 20-25% - Cath 5/21 EF 20-25% normal coronaries. Well compensated hemodynamics with CI 3.4 - Echo 12/21 EF 25% - CPX 8/21 very reassuring - Peak VO2: 15.3 (90% predicted peak VO2) - adjusted to iBW 25.36ml/kg/min. VEVCO2 33  - Echo 11/22 EF 25-30% G1DD   RV ok  - Echo 01/09/23: EF 20-25% RV ok G2 DD mod MR Personally reviewed - Relatively stable NYHA II-III perhaps slightly worse due to the heat - Volume status stable on torsemide 50mg  daily - Entresto stopped due to low BP - Cotninue losartan 25 at bedtime - BP soft but tolerating - Continue spiro 25 mg daily.  - Continue toprol 50 mg BID - Continue digoxin 0.125 mg.  - Continue Farxiga 10 mg daily.  - Continue ivabradine 7.5 bid - Labs today - Will refer for Barostim evaluation.Has left carotid pseudoaneurysm s/p stent Hopefully right will be suitable.   2. CAD s/p anterior MI 1/18 with DES to LAD in [place] - cath 5/21 normal cors with patent LAD stent - no s/s angina - Failed statin. Now on Zetia - Goal LDL < 70. Followed by Dr. Sedalia Muta   3. Obesity Body mass index is 34.08 kg/m. - Consider GLP1RA   4. 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 No changes   5. Palpitations - Intermittent. - Zio patch 10/19 was ok  - Improved  6. Breast Cancer - s/p lumpectomy 9/22 followed by XRT. Completed 12/22 - no plans for chemo  7. Polycythemia - followed by hematology. BMBx was normal - Home sleep study 1/22 AHI 4.6    Signed, [name], MD  07/08/2023 10:18 AM  [facility] [address] [facility] [place] [address] [phone] (office) [phone] (fax)

## 2023-07-08 ENCOUNTER — Encounter (HOSPITAL_COMMUNITY): Payer: Self-pay | Admitting: Internal Medicine

## 2023-07-08 ENCOUNTER — Ambulatory Visit (HOSPITAL_COMMUNITY): Admission: RE | Admit: 2023-07-08 | Payer: PPO | Source: Ambulatory Visit | Admitting: Internal Medicine

## 2023-07-08 VITALS — BP 94/50 | HR 67 | Wt 204.8 lb

## 2023-07-08 DIAGNOSIS — Z955 Presence of coronary angioplasty implant and graft: Secondary | ICD-10-CM | POA: Insufficient documentation

## 2023-07-08 DIAGNOSIS — Z6834 Body mass index (BMI) 34.0-34.9, adult: Secondary | ICD-10-CM | POA: Diagnosis not present

## 2023-07-08 DIAGNOSIS — R002 Palpitations: Secondary | ICD-10-CM | POA: Insufficient documentation

## 2023-07-08 DIAGNOSIS — I959 Hypotension, unspecified: Secondary | ICD-10-CM | POA: Insufficient documentation

## 2023-07-08 DIAGNOSIS — I252 Old myocardial infarction: Secondary | ICD-10-CM | POA: Diagnosis not present

## 2023-07-08 DIAGNOSIS — D751 Secondary polycythemia: Secondary | ICD-10-CM | POA: Insufficient documentation

## 2023-07-08 DIAGNOSIS — Z79899 Other long term (current) drug therapy: Secondary | ICD-10-CM | POA: Diagnosis not present

## 2023-07-08 DIAGNOSIS — E669 Obesity, unspecified: Secondary | ICD-10-CM | POA: Diagnosis not present

## 2023-07-08 DIAGNOSIS — C50919 Malignant neoplasm of unspecified site of unspecified female breast: Secondary | ICD-10-CM | POA: Diagnosis not present

## 2023-07-08 DIAGNOSIS — I110 Hypertensive heart disease with heart failure: Secondary | ICD-10-CM | POA: Diagnosis not present

## 2023-07-08 DIAGNOSIS — I5022 Chronic systolic (congestive) heart failure: Secondary | ICD-10-CM | POA: Diagnosis not present

## 2023-07-08 DIAGNOSIS — I2510 Atherosclerotic heart disease of native coronary artery without angina pectoris: Secondary | ICD-10-CM | POA: Diagnosis not present

## 2023-07-08 NOTE — Patient Instructions (Signed)
Medication Changes:  None, continue current medications  Lab Work:  Labs done today, your results will be available in MyChart, we will contact you for abnormal readings.  Testing/Procedures:  none  Referrals:  You have been referred to [facility], they will call you for an appointment  Special Instructions // Education:  Do the following things EVERYDAY: Weigh yourself in the morning before breakfast. Write it down and keep it in a log. Take your medicines as prescribed Eat low salt foods--Limit salt (sodium) to 2000 mg per day.  Stay as active as you can everyday Limit all fluids for the day to less than 2 liters   Follow-Up in: 6 months (Feb 2025), **PLEASE CALL OUR OFFICE IN DECEMBER TO SCHEDULE THIS APPOINTMENT   At the [facility], you and your health needs are our priority. We have a designated team specialized in the treatment of Heart Failure. This Care Team includes your primary Heart Failure Specialized Cardiologist (physician), Advanced Practice Providers (APPs- Physician Assistants and Nurse Practitioners), and Pharmacist who all work together to provide you with the care you need, when you need it.   You may see any of the following providers on your designated Care Team at your next follow up:  Dr. [name] Dr. Marca Ancona Dr. Marcos Eke, NP Robbie Lis, [place] [facility] [place], [place] Brynda Peon, NP Karle Plumber, PharmD   Please be sure to bring in all your medications bottles to every appointment.   Need to Contact us:  If you have any questions or concerns before your next appointment please send [place] a message through [place] or call our office at [phone].    TO LEAVE A MESSAGE FOR THE NURSE SELECT OPTION 2, PLEASE LEAVE A MESSAGE INCLUDING: YOUR NAME DATE OF BIRTH CALL BACK NUMBER REASON FOR CALL**this is important as we prioritize the call  backs  YOU WILL RECEIVE A CALL BACK THE SAME DAY AS LONG AS YOU CALL BEFORE 4:00 PM

## 2023-07-10 ENCOUNTER — Encounter: Payer: Self-pay | Admitting: Family Medicine

## 2023-07-10 ENCOUNTER — Ambulatory Visit: Payer: PPO | Admitting: Family Medicine

## 2023-07-10 VITALS — BP 118/72 | HR 71 | Temp 97.1°F | Ht 65.0 in | Wt 202.0 lb

## 2023-07-10 DIAGNOSIS — I152 Hypertension secondary to endocrine disorders: Secondary | ICD-10-CM

## 2023-07-10 DIAGNOSIS — E1159 Type 2 diabetes mellitus with other circulatory complications: Secondary | ICD-10-CM

## 2023-07-10 DIAGNOSIS — Z170 Estrogen receptor positive status [ER+]: Secondary | ICD-10-CM | POA: Diagnosis not present

## 2023-07-10 DIAGNOSIS — E782 Mixed hyperlipidemia: Secondary | ICD-10-CM

## 2023-07-10 DIAGNOSIS — I25119 Atherosclerotic heart disease of native coronary artery with unspecified angina pectoris: Secondary | ICD-10-CM | POA: Diagnosis not present

## 2023-07-10 DIAGNOSIS — C50412 Malignant neoplasm of upper-outer quadrant of left female breast: Secondary | ICD-10-CM

## 2023-07-10 DIAGNOSIS — I5022 Chronic systolic (congestive) heart failure: Secondary | ICD-10-CM | POA: Diagnosis not present

## 2023-07-10 DIAGNOSIS — K219 Gastro-esophageal reflux disease without esophagitis: Secondary | ICD-10-CM

## 2023-07-10 DIAGNOSIS — Z6833 Body mass index (BMI) 33.0-33.9, adult: Secondary | ICD-10-CM | POA: Diagnosis not present

## 2023-07-10 DIAGNOSIS — I773 Arterial fibromuscular dysplasia: Secondary | ICD-10-CM

## 2023-07-10 LAB — COMPREHENSIVE METABOLIC PANEL
ALT: 89 IU/L — ABNORMAL HIGH (ref 0–32)
AST: 42 IU/L — ABNORMAL HIGH (ref 0–40)
Albumin: 4.2 g/dL (ref 3.9–4.9)
Alkaline Phosphatase: 106 IU/L (ref 44–121)
BUN/Creatinine Ratio: 15 (ref 12–28)
BUN: 15 mg/dL (ref 8–27)
Bilirubin Total: 0.6 mg/dL (ref 0.0–1.2)
CO2: 27 mmol/L (ref 20–29)
Calcium: 10.4 mg/dL — ABNORMAL HIGH (ref 8.7–10.3)
Chloride: 98 mmol/L (ref 96–106)
Creatinine, Ser: 0.98 mg/dL (ref 0.57–1.00)
Globulin, Total: 2.4 g/dL (ref 1.5–4.5)
Glucose: 137 mg/dL — ABNORMAL HIGH (ref 70–99)
Potassium: 4.3 mmol/L (ref 3.5–5.2)
Sodium: 140 mmol/L (ref 134–144)
Total Protein: 6.6 g/dL (ref 6.0–8.5)
eGFR: 66 mL/min/{1.73_m2} (ref >59)

## 2023-07-10 LAB — CBC WITH DIFFERENTIAL/PLATELET
Basophils Absolute: 0 10*3/uL (ref 0.0–0.2)
Basos: 1 %
EOS (ABSOLUTE): 0.2 10*3/uL (ref 0.0–0.4)
Eos: 2 %
Hematocrit: 46.2 % (ref 34.0–46.6)
Hemoglobin: 15.8 g/dL (ref 11.1–15.9)
Immature Grans (Abs): 0 10*3/uL (ref 0.0–0.1)
Immature Granulocytes: 0 %
Lymphocytes Absolute: 1.7 10*3/uL (ref 0.7–3.1)
Lymphs: 26 %
MCH: 31.4 pg (ref 26.6–33.0)
MCHC: 34.2 g/dL (ref 31.5–35.7)
MCV: 92 fL (ref 79–97)
Monocytes Absolute: 0.6 10*3/uL (ref 0.1–0.9)
Monocytes: 8 %
Neutrophils Absolute: 4.1 10*3/uL (ref 1.4–7.0)
Neutrophils: 63 %
Platelets: 255 10*3/uL (ref 150–450)
RBC: 5.03 x10E6/uL (ref 3.77–5.28)
RDW: 12.7 % (ref 11.7–15.4)
WBC: 6.6 10*3/uL (ref 3.4–10.8)

## 2023-07-10 LAB — LIPID PANEL
Chol/HDL Ratio: 5.3 ratio — ABNORMAL HIGH (ref 0.0–4.4)
Cholesterol, Total: 240 mg/dL — ABNORMAL HIGH (ref 100–199)
HDL: 45 mg/dL (ref >39)
LDL Chol Calc (NIH): 148 mg/dL — ABNORMAL HIGH (ref 0–99)
Triglycerides: 257 mg/dL — ABNORMAL HIGH (ref 0–149)
VLDL Cholesterol Cal: 47 mg/dL — ABNORMAL HIGH (ref 5–40)

## 2023-07-10 LAB — HEMOGLOBIN A1C
Est. average glucose Bld gHb Est-mCnc: 148 mg/dL
Hgb A1c MFr Bld: 6.8 % — ABNORMAL HIGH (ref 4.8–5.6)

## 2023-07-10 MED ORDER — DICYCLOMINE HCL 10 MG PO CAPS: 10.0000 mg | ORAL_CAPSULE | Freq: Four times a day (QID) | ORAL | 0 refills | Status: DC | PRN

## 2023-07-10 MED ORDER — PANTOPRAZOLE SODIUM 40 MG PO TBEC: 40.0000 mg | DELAYED_RELEASE_TABLET | Freq: Every day | ORAL | 1 refills | Status: DC

## 2023-07-10 NOTE — Assessment & Plan Note (Signed)
Management per specialist.  Continue raloxifene.

## 2023-07-10 NOTE — Assessment & Plan Note (Addendum)
Stop omperazole 20 mg daily  Start protonix 40 mg daily Start dicyclomine 10 mg four times prn

## 2023-07-10 NOTE — Assessment & Plan Note (Signed)
Well controlled.  ?No changes to medicines. Continue zetia 10 mg daily.  ?Continue to work on eating a healthy diet and exercise.  ?Labs drawn today.  ? ?

## 2023-07-10 NOTE — Progress Notes (Signed)
Subjective:  Patient ID: Allison White, female    DOB: [birth year]  Age: 62 y.o. MRN: [id number]  Chief Complaint  Patient presents with   Medical Management of Chronic Issues    HPI   Diabetes: continue farxiga 10 mg daily. Does not check her sugars. A1C 6.9, overdue for eye exam   History of carotid artery dissection. Will be having echo this month. Followed by Bensimhon and Dr. Elberta Fortis. Has been seen at the [facility] clinic. Hx of breast cancer-lumpectomy-see's Dr. Melvyn Neth. Has seen Neurology and Neurosurgeon. Pseudoaneurysm.   CHF: taking torsemide 100 mg 1/2 tablet daily, spironolactone 25 mg daily, Digoxin 0.125 mg daily, ivabradine 7.5 g twice daily, farxiga 10 mg daily   Hyperlipidemia: currently taking zetia 10 mg daily and maintaining a low cholesterol diet. Intolerant to statins/praluent, ASA 81 mg, vascepa   Hypertension: taking metoprolol xl 50 mg twice daily, torsemide 100 mg 1/2 tablet daily, spironolactone 25 mg daily, Digoxin 0.125 mg daily, ivabradine 7.5 g twice daily, farxiga 10 mg daily.   Headaches- takes vicodin as needed   Breast cancer: on evista.    GERD: on omeprazole 20 mg daily.      07/10/2023   11:07 AM 03/27/2023   11:03 AM 01/23/2023   10:38 AM 12/24/2022   11:15 AM 11/29/2021    1:35 PM  Depression screen PHQ 2/9  Decreased Interest 0 2 0 0 0  Down, Depressed, Hopeless 0 0 0 0 0  PHQ - 2 Score 0 2 0 0 0  Altered sleeping 2 2     Tired, decreased energy 3 2     Change in appetite 2 2     Feeling bad or failure about yourself  0 0     Trouble concentrating 0 0     Moving slowly or fidgety/restless 2 1     Suicidal thoughts 0 0     PHQ-9 Score 9 9     Difficult doing work/chores Somewhat difficult Somewhat difficult           07/10/2023   11:06 AM  Fall Risk   Falls in the past year? 1  Number falls in past yr: 0  Injury with Fall? 0  Risk for fall due to : No Fall Risks;History of fall(s)    Patient Care Team: Blane Ohara, MD as PCP -  General (Family Medicine) Regan Lemming, MD as PCP - Electrophysiology (Cardiology) Bensimhon, Bevelyn Buckles, MD as PCP - Advanced Heart Failure (Cardiology) Weston Settle, MD as Consulting Physician (Oncology) Lance Bosch, MD as Consulting Physician (Radiation Oncology) Zettie Pho, [facility] (Inactive) (Pharmacist) Julieanne Cotton, MD as Consulting Physician (Interventional Radiology) Weston Settle, MD as Consulting Physician (Oncology) Birdie Sons, OD (Ophthalmology) Misenheimer, Marcial Pacas, MD as Consulting Physician (Unknown Physician Specialty)   Review of Systems  Constitutional:  Negative for chills, fatigue and fever.  HENT:  Negative for congestion, ear pain, rhinorrhea and sore throat.   Respiratory:  Negative for cough and shortness of breath.   Cardiovascular:  Negative for chest pain.  Gastrointestinal:  Positive for abdominal pain (sometimes 2-3 times a week due to GERD). Negative for constipation, diarrhea, nausea and vomiting.  Genitourinary:  Negative for dysuria and urgency.  Musculoskeletal:  Negative for back pain and myalgias.  Neurological:  Negative for dizziness, weakness, light-headedness and headaches.  Psychiatric/Behavioral:  Negative for dysphoric mood. The patient is not nervous/anxious.     Current Outpatient Medications on File Prior to Visit  Medication  Sig Dispense Refill   aspirin 81 MG EC tablet Take 81 mg by mouth daily.      dapagliflozin propanediol (FARXIGA) 10 MG TABS tablet Take 1 tablet (10 mg total) by mouth daily. 90 tablet 3   digoxin (LANOXIN) 0.125 MG tablet Take 1 tablet by mouth once daily 90 tablet 3   ezetimibe (ZETIA) 10 MG tablet Take 1 tablet (10 mg total) by mouth daily. 90 tablet 1   HYDROcodone-acetaminophen (NORCO) 7.5-325 MG tablet Take 1 tablet by mouth every 6 (six) hours as needed for moderate pain. 30 tablet 0   icosapent Ethyl (VASCEPA) 1 g capsule Take 2 capsules by mouth twice daily 120 capsule 0    ivabradine (CORLANOR) 7.5 MG TABS tablet Take 1 tablet (7.5 mg total) by mouth 2 (two) times daily with a meal. 180 tablet 3   metoprolol succinate (TOPROL-XL) 50 MG 24 hr tablet Take 1 tablet by mouth twice daily 180 tablet 3   omeprazole (PRILOSEC) 20 MG capsule Take 1 capsule by mouth once daily 90 capsule 0   raloxifene (EVISTA) 60 MG tablet Take 1 tablet (60 mg total) by mouth daily. 90 tablet 3   spironolactone (ALDACTONE) 25 MG tablet Take 1 tablet (25 mg total) by mouth daily. 90 tablet 2   torsemide (DEMADEX) 100 MG tablet Take 1/2 (one-half) tablet by mouth once daily 45 tablet 3   ibuprofen (ADVIL) 200 MG tablet Take 200 mg by mouth as needed.     LORATADINE PO Take by mouth daily. [facility] brand     nitroGLYCERIN (NITROSTAT) 0.4 MG SL tablet Place 0.4 mg under the tongue every 5 (five) minutes as needed for chest pain.     Phenazopyridine HCl (AZO TABS PO) Take 1 capsule by mouth as needed.     No current facility-administered medications on file prior to visit.   Past Medical History:  Diagnosis Date   Cancer (HCC) 2022   Rt Breast   CHF (congestive heart failure) (HCC)    FMD (facioscapulohumeral muscular dystrophy) (HCC)    Headache(784.0)    Heart failure (HCC)    Hyperlipidemia    MI (myocardial infarction) (HCC)    Migraine    Nonruptured cerebral aneurysm, internal carotid artery    Left side, stent placement (2009)   Pseudoaneurysm (HCC)    both carotids    Vertigo    Past Surgical History:  Procedure Laterality Date   ABDOMINAL HYSTERECTOMY  08/16/2004   Uterus and Cervix (per pathology)   BREAST LUMPECTOMY Right 07/2021   CARDIAC DEFIBRILLATOR PLACEMENT     CEREBRAL ANEURYSM REPAIR Left 2009   CESAREAN SECTION     CORONARY ANGIOPLASTY WITH STENT PLACEMENT     PACEMAKER IMPLANT     RIGHT/LEFT HEART CATH AND CORONARY ANGIOGRAPHY N/A 04/07/2020   Procedure: RIGHT/LEFT HEART CATH AND CORONARY ANGIOGRAPHY;  Surgeon: Dolores Patty, MD;  Location: [facility]  INVASIVE CV LAB;  Service: Cardiovascular;  Laterality: N/A;   TUBAL LIGATION      Family History  Problem Relation Age of Onset   Thyroid disease Mother    Gastric cancer Mother 60       w/ signet ring features   Prostate cancer Father        d. 80   Hypertension Brother    Breast cancer Maternal Aunt 66   Brain cancer Maternal Aunt 22   Prostate cancer Paternal Uncle        x2 pat uncles   Prostate cancer Maternal  Grandfather        metastatic; d. 82   Cerebral aneurysm Paternal Grandmother        Nonruptured   Dementia Paternal Grandfather    Prostate cancer Paternal Grandfather        metastatic   Social History   Socioeconomic History   Marital status: Married    Spouse name: Tim   Number of children: 4   Years of education: Not on file   Highest education level: High school graduate  Occupational History   Not on file  Tobacco Use   Smoking status: Former    Current packs/day: 1.00    Average packs/day: 1 pack/day for 6.0 years (6.0 ttl pk-yrs)    Types: Cigarettes   Smokeless tobacco: Never   Tobacco comments:    Quit over 30 years ago  Vaping Use   Vaping status: Never Used  Substance and Sexual Activity   Alcohol use: Yes    Comment: Occasionally "little"   Drug use: No   Sexual activity: Not Currently  Other Topics Concern   Not on file  Social History Narrative   Lives with husband in a one-story home.     Right handed   Caffeine: "very little"   Social Determinants of Health   Financial Resource Strain: Low Risk  (03/05/2023)   Overall Financial Resource Strain (CARDIA)    Difficulty of Paying Living Expenses: Not hard at all  Recent Concern: Financial Resource Strain - Medium Risk (01/23/2023)   Overall Financial Resource Strain (CARDIA)    Difficulty of Paying Living Expenses: Somewhat hard  Food Insecurity: No Food Insecurity (01/23/2023)   Hunger Vital Sign    Worried About Running Out of Food in the Last Year: Never true    Ran Out of Food  in the Last Year: Never true  Transportation Needs: No Transportation Needs (03/05/2023)   PRAPARE - Administrator, Civil Service (Medical): No    Lack of Transportation (Non-Medical): No  Physical Activity: Sufficiently Active (01/23/2023)   Exercise Vital Sign    Days of Exercise per Week: 4 days    Minutes of Exercise per Session: 90 min  Stress: No Stress Concern Present (01/23/2023)   [employer] of Occupational Health - Occupational Stress Questionnaire    Feeling of Stress : Not at all  Social Connections: Moderately Integrated (10/26/2020)   Social Connection and Isolation Panel [NHANES]    Frequency of Communication with Friends and Family: More than three times a week    Frequency of Social Gatherings with Friends and Family: Twice a week    Attends Religious Services: 1 to 4 times per year    Active Member of [employer] or Organizations: No    Attends Engineer, structural: Never    Marital Status: Married    Objective:  BP 118/72   Pulse 71   Temp (!) 97.1 F (36.2 C)   Ht 5\' 5"  (1.651 m)   Wt 202 lb (91.6 kg)   SpO2 99%   BMI 33.61 kg/m      07/10/2023   11:02 AM 07/08/2023    9:55 AM 03/27/2023   10:57 AM  BP/Weight  Systolic BP 118 94 110  Diastolic BP 72 50 78  Wt. (Lbs) 202 204.8 209  BMI 33.61 kg/m2 34.08 kg/m2 34.78 kg/m2    Physical Exam Vitals reviewed.  Constitutional:      Appearance: Normal appearance. She is obese.  Neck:     Vascular:  No carotid bruit.  Cardiovascular:     Rate and Rhythm: Normal rate and regular rhythm.     Heart sounds: Normal heart sounds.  Pulmonary:     Effort: Pulmonary effort is normal. No respiratory distress.     Breath sounds: Normal breath sounds.  Abdominal:     General: Abdomen is flat. Bowel sounds are normal.     Palpations: Abdomen is soft.     Tenderness: There is no abdominal tenderness.  Neurological:     Mental Status: She is alert and oriented to person, place, and time.   Psychiatric:        Mood and Affect: Mood normal.        Behavior: Behavior normal.     Diabetic Foot Exam - Simple   Simple Foot Form Diabetic Foot exam was performed with the following findings: Yes 07/10/2023 11:21 AM  Visual Inspection No deformities, no ulcerations, no other skin breakdown bilaterally: Yes Sensation Testing Intact to touch and monofilament testing bilaterally: Yes Pulse Check Posterior Tibialis and Dorsalis pulse intact bilaterally: Yes Comments      Lab Results  Component Value Date   WBC 6.6 07/10/2023   HGB 15.8 07/10/2023   HCT 46.2 07/10/2023   PLT 255 07/10/2023   GLUCOSE 137 (H) 07/10/2023   CHOL 240 (H) 07/10/2023   TRIG 257 (H) 07/10/2023   HDL 45 07/10/2023   LDLCALC 148 (H) 07/10/2023   ALT 89 (H) 07/10/2023   AST 42 (H) 07/10/2023   NA 140 07/10/2023   K 4.3 07/10/2023   CL 98 07/10/2023   CREATININE 0.98 07/10/2023   BUN 15 07/10/2023   CO2 27 07/10/2023   TSH 3.720 12/24/2022   INR 1.0 04/04/2020   HGBA1C 6.8 (H) 07/10/2023      Assessment & Plan:    Coronary artery disease involving native coronary artery of native heart with angina pectoris (HCC) Assessment & Plan: Continue aspirin 81 mg daily.  Continue zetia 10 mg daily, toprol xl 50 mg twice daily    Mixed hyperlipidemia Assessment & Plan: Well controlled.  No changes to medicines. Continue zetia 10 mg daily.  Continue to work on eating a healthy diet and exercise.  Labs drawn today.    Orders: -     Lipid panel  Hypertension associated with diabetes (HCC) Assessment & Plan: Control: good Recommend check feet daily. Recommend annual eye exams. Medicines: continue farxiga 10 mg daily  Continue to work on eating a healthy diet and exercise.  Labs drawn today.     Orders: -     CBC with Differential/Platelet -     Comprehensive metabolic panel -     Hemoglobin A1c -     Ambulatory referral to Optometry  Fibromuscular dysplasia [facility]) Assessment &  Plan: Management per specialist.     Malignant neoplasm of upper-outer quadrant of left breast in female, estrogen receptor positive (HCC) Assessment & Plan: Management per specialist.  Continue raloxifene.   Chronic systolic congestive heart failure (HCC) Assessment & Plan: Management for specialist. Continue metoprolol xl 50 mg twice daily, torsemide 100 mg 1/2 tablet daily, spironolactone 25 mg daily, Digoxin 0.125 mg daily, ivabradine 7.5 g twice daily, farxiga 10 mg daily.   Gastroesophageal reflux disease without esophagitis Assessment & Plan: Stop omperazole 20 mg daily  Start protonix 40 mg daily Start dicyclomine 10 mg four times prn   BMI 33.0-33.9,adult  Other orders -     Pantoprazole Sodium; Take 1 tablet (40 mg  total) by mouth daily.  Dispense: 90 tablet; Refill: 1 -     Dicyclomine HCl; Take 1 capsule (10 mg total) by mouth 4 (four) times daily as needed for spasms.  Dispense: 120 capsule; Refill: 0     Meds ordered this encounter  Medications   pantoprazole (PROTONIX) 40 MG tablet    Sig: Take 1 tablet (40 mg total) by mouth daily.    Dispense:  90 tablet    Refill:  1   dicyclomine (BENTYL) 10 MG capsule    Sig: Take 1 capsule (10 mg total) by mouth 4 (four) times daily as needed for spasms.    Dispense:  120 capsule    Refill:  0    Orders Placed This Encounter  Procedures   [facility] MAMMOGRAPHY   CBC with Differential/Platelet   Comprehensive metabolic panel   Lipid panel   Hemoglobin A1c   Ambulatory referral to Optometry     Follow-up: Return in about 3 months (around 10/10/2023) for chronic.   I,Katherina A Bramblett,acting as a scribe for Blane Ohara, MD.,have documented all relevant documentation on the behalf of Blane Ohara, MD,as directed by  Blane Ohara, MD while in the presence of Blane Ohara, MD.   An After Visit Summary was printed and given to the patient.  Blane Ohara, MD [facility] [phone]

## 2023-07-10 NOTE — Assessment & Plan Note (Signed)
Management per specialist. 

## 2023-07-10 NOTE — Assessment & Plan Note (Signed)
Control: good Recommend check feet daily. Recommend annual eye exams. Medicines: continue farxiga 10 mg daily  Continue to work on eating a healthy diet and exercise.  Labs drawn today.

## 2023-07-10 NOTE — Assessment & Plan Note (Signed)
Management for specialist. Continue metoprolol xl 50 mg twice daily, torsemide 100 mg 1/2 tablet daily, spironolactone 25 mg daily, Digoxin 0.125 mg daily, ivabradine 7.5 g twice daily, farxiga 10 mg daily.

## 2023-07-14 NOTE — Assessment & Plan Note (Signed)
Continue aspirin 81 mg daily.  Continue zetia 10 mg daily, toprol xl 50 mg twice daily

## 2023-07-19 ENCOUNTER — Other Ambulatory Visit: Payer: Self-pay

## 2023-07-19 DIAGNOSIS — R899 Unspecified abnormal finding in specimens from other organs, systems and tissues: Secondary | ICD-10-CM

## 2023-07-24 ENCOUNTER — Telehealth: Payer: Self-pay | Admitting: Family Medicine

## 2023-07-24 ENCOUNTER — Other Ambulatory Visit: Payer: Self-pay

## 2023-07-24 ENCOUNTER — Other Ambulatory Visit: Payer: PPO

## 2023-07-24 DIAGNOSIS — R7401 Elevation of levels of liver transaminase levels: Secondary | ICD-10-CM | POA: Diagnosis not present

## 2023-07-24 DIAGNOSIS — R899 Unspecified abnormal finding in specimens from other organs, systems and tissues: Secondary | ICD-10-CM | POA: Diagnosis not present

## 2023-07-24 DIAGNOSIS — I773 Arterial fibromuscular dysplasia: Secondary | ICD-10-CM

## 2023-07-24 MED ORDER — HYDROCODONE-ACETAMINOPHEN 7.5-325 MG PO TABS: 1.0000 | ORAL_TABLET | Freq: Four times a day (QID) | ORAL | 0 refills | Status: DC | PRN

## 2023-07-24 NOTE — Telephone Encounter (Signed)
Prescription Request  07/24/2023  LOV: 07/10/2023  What is the name of the medication or equipment? HYDROcodone-acetaminophen   Have you contacted your pharmacy to request a refill? No   Which pharmacy would you like this sent to?   [facility] [address], [place] - [address] [place] [address] Phone: [phone] Fax: [phone]   Patient notified that their request is being sent to the clinical staff for review and that they should receive a response within 2 business days.   Please advise at [facility] [phone]

## 2023-07-25 LAB — COMPREHENSIVE METABOLIC PANEL
ALT: 105 IU/L — ABNORMAL HIGH (ref 0–32)
AST: 88 IU/L — ABNORMAL HIGH (ref 0–40)
Albumin: 4.4 g/dL (ref 3.9–4.9)
Alkaline Phosphatase: 132 IU/L — ABNORMAL HIGH (ref 44–121)
BUN/Creatinine Ratio: 12 (ref 12–28)
BUN: 14 mg/dL (ref 8–27)
Bilirubin Total: 0.4 mg/dL (ref 0.0–1.2)
CO2: 28 mmol/L (ref 20–29)
Calcium: 10.3 mg/dL (ref 8.7–10.3)
Chloride: 95 mmol/L — ABNORMAL LOW (ref 96–106)
Creatinine, Ser: 1.15 mg/dL — ABNORMAL HIGH (ref 0.57–1.00)
Globulin, Total: 2.5 g/dL (ref 1.5–4.5)
Glucose: 236 mg/dL — ABNORMAL HIGH (ref 70–99)
Potassium: 3.8 mmol/L (ref 3.5–5.2)
Sodium: 140 mmol/L (ref 134–144)
Total Protein: 6.9 g/dL (ref 6.0–8.5)
eGFR: 54 mL/min/{1.73_m2} — ABNORMAL LOW (ref >59)

## 2023-07-25 LAB — LITHOLINK CKD PROGRAM

## 2023-07-29 LAB — ACUTE HEP PANEL AND HEP B SURFACE AB
Hep A IgM: NEGATIVE
Hep B C IgM: NEGATIVE
Hep C Virus Ab: NONREACTIVE
Hepatitis B Surf Ab Quant: <3.5 m[IU]/mL — ABNORMAL LOW
Hepatitis B Surface Ag: NEGATIVE

## 2023-07-29 LAB — SPECIMEN STATUS REPORT

## 2023-07-31 ENCOUNTER — Encounter (HOSPITAL_COMMUNITY): Payer: Self-pay | Admitting: Internal Medicine

## 2023-08-01 ENCOUNTER — Other Ambulatory Visit (HOSPITAL_COMMUNITY): Payer: Self-pay

## 2023-08-01 MED ORDER — TORSEMIDE 100 MG PO TABS: 50.0000 mg | ORAL_TABLET | Freq: Every day | ORAL | 3 refills | Status: DC

## 2023-08-07 ENCOUNTER — Other Ambulatory Visit: Payer: Self-pay | Admitting: *Deleted

## 2023-08-07 DIAGNOSIS — I720 Aneurysm of carotid artery: Secondary | ICD-10-CM

## 2023-08-08 ENCOUNTER — Encounter: Payer: Self-pay | Admitting: Family Medicine

## 2023-08-14 ENCOUNTER — Other Ambulatory Visit: Payer: Self-pay | Admitting: Family Medicine

## 2023-08-14 ENCOUNTER — Other Ambulatory Visit: Payer: Self-pay

## 2023-08-14 DIAGNOSIS — I25119 Atherosclerotic heart disease of native coronary artery with unspecified angina pectoris: Secondary | ICD-10-CM

## 2023-08-14 DIAGNOSIS — I773 Arterial fibromuscular dysplasia: Secondary | ICD-10-CM

## 2023-08-15 MED ORDER — HYDROCODONE-ACETAMINOPHEN 7.5-325 MG PO TABS: 1.0000 | ORAL_TABLET | Freq: Four times a day (QID) | ORAL | 0 refills | Status: DC | PRN

## 2023-08-15 MED ORDER — ICOSAPENT ETHYL 1 G PO CAPS
2.0000 g | ORAL_CAPSULE | Freq: Two times a day (BID) | ORAL | 0 refills | Status: DC
Start: 2023-08-15 — End: 2023-09-19

## 2023-08-18 ENCOUNTER — Ambulatory Visit (INDEPENDENT_AMBULATORY_CARE_PROVIDER_SITE_OTHER): Payer: PPO | Admitting: Surgery

## 2023-08-18 ENCOUNTER — Ambulatory Visit (HOSPITAL_COMMUNITY)
Admission: RE | Admit: 2023-08-18 | Discharge: 2023-08-18 | Disposition: A | Discharge status: Home / Self Care | Payer: PPO | Source: Ambulatory Visit | Attending: Vascular Surgery | Admitting: Vascular Surgery

## 2023-08-18 ENCOUNTER — Other Ambulatory Visit: Payer: Self-pay | Admitting: Family Medicine

## 2023-08-18 ENCOUNTER — Encounter: Payer: Self-pay | Admitting: Surgery

## 2023-08-18 VITALS — BP 116/81 | HR 66 | Temp 97.9°F | Resp 20 | Ht 65.0 in | Wt 206.3 lb

## 2023-08-18 DIAGNOSIS — I720 Aneurysm of carotid artery: Secondary | ICD-10-CM

## 2023-08-18 DIAGNOSIS — R748 Abnormal levels of other serum enzymes: Secondary | ICD-10-CM

## 2023-08-18 DIAGNOSIS — I5042 Chronic combined systolic (congestive) and diastolic (congestive) heart failure: Secondary | ICD-10-CM | POA: Diagnosis not present

## 2023-08-18 NOTE — Progress Notes (Signed)
[place] ordered.  Dr. Sedalia Muta

## 2023-08-18 NOTE — Progress Notes (Signed)
Vascular and Vein Specialist of [facility]  Patient name: Allison White MRN: [id number] DOB: [birth year] Sex: female   REQUESTING PROVIDER:    Dr. Gala Romney   REASON FOR CONSULT:    Barostim evaluation  HISTORY OF PRESENT ILLNESS:   Allison White is a 62 y.o. female, who is referred for Barostim evaluation.  The patient suffers from NYHA II-III symptoms.  She is on goal-directed medical therapy and having persistent symptoms.  She is very frustrated with her level of energy.  She is status post MI in 2018 with PCI her ejection fraction is in the 20-25% range.  She has a Biotronik ICD in place.  She has a history of left pseudoaneurysm stent placement in the carotid artery at the petrous portion in 2009 and 10 for FMD.  PAST MEDICAL HISTORY    Past Medical History:  Diagnosis Date   Cancer (HCC) 2022   Rt Breast   CHF (congestive heart failure) (HCC)    FMD (facioscapulohumeral muscular dystrophy) (HCC)    Headache(784.0)    Heart failure (HCC)    Hyperlipidemia    MI (myocardial infarction) (HCC)    Migraine    Nonruptured cerebral aneurysm, internal carotid artery    Left side, stent placement (2009)   Pseudoaneurysm (HCC)    both carotids    Vertigo      FAMILY HISTORY   Family History  Problem Relation Age of Onset   Thyroid disease Mother    Gastric cancer Mother 78       w/ signet ring features   Prostate cancer Father        d. 70   Hypertension Brother    Breast cancer Maternal Aunt 18   Brain cancer Maternal Aunt 30   Prostate cancer Paternal Uncle        x2 pat uncles   Prostate cancer Maternal Grandfather        metastatic; d. 48   Cerebral aneurysm Paternal Grandmother        Nonruptured   Dementia Paternal Grandfather    Prostate cancer Paternal Grandfather        metastatic    SOCIAL HISTORY:   Social History   Socioeconomic History   Marital status: Married    Spouse name: Tim   Number of children: 4    Years of education: Not on file   Highest education level: High school graduate  Occupational History   Not on file  Tobacco Use   Smoking status: Former    Current packs/day: 1.00    Average packs/day: 1 pack/day for 6.0 years (6.0 ttl pk-yrs)    Types: Cigarettes   Smokeless tobacco: Never   Tobacco comments:    Quit over 30 years ago  Vaping Use   Vaping status: Never Used  Substance and Sexual Activity   Alcohol use: Yes    Comment: Occasionally "little"   Drug use: No   Sexual activity: Not Currently  Other Topics Concern   Not on file  Social History Narrative   Lives with husband in a one-story home.     Right handed   Caffeine: "very little"   Social Determinants of Health   Financial Resource Strain: Low Risk  (03/05/2023)   Overall Financial Resource Strain (CARDIA)    Difficulty of Paying Living Expenses: Not hard at all  Recent Concern: Financial Resource Strain - Medium Risk (01/23/2023)   Overall Financial Resource Strain (CARDIA)    Difficulty of Paying Living Expenses: Somewhat hard  Food Insecurity: No Food Insecurity (01/23/2023)   Hunger Vital Sign    Worried About Running Out of Food in the Last Year: Never true    Ran Out of Food in the Last Year: Never true  Transportation Needs: No Transportation Needs (03/05/2023)   PRAPARE - Administrator, Civil Service (Medical): No    Lack of Transportation (Non-Medical): No  Physical Activity: Sufficiently Active (01/23/2023)   Exercise Vital Sign    Days of Exercise per Week: 4 days    Minutes of Exercise per Session: 90 min  Stress: No Stress Concern Present (01/23/2023)   [employer] of Occupational Health - Occupational Stress Questionnaire    Feeling of Stress : Not at all  Social Connections: Moderately Integrated (10/26/2020)   Social Connection and Isolation Panel [NHANES]    Frequency of Communication with Friends and Family: More than three times a week    Frequency of Social  Gatherings with Friends and Family: Twice a week    Attends Religious Services: 1 to 4 times per year    Active Member of [employer] or Organizations: No    Attends Banker Meetings: Never    Marital Status: Married  Catering manager Violence: Not At Risk (01/23/2023)   Humiliation, Afraid, Rape, and Kick questionnaire    Fear of Current or Ex-Partner: No    Emotionally Abused: No    Physically Abused: No    Sexually Abused: No    ALLERGIES:    Allergies  Allergen Reactions   Tape Rash    PREFERS CLOTH OR NOTHING   Plavix [Clopidogrel Bisulfate] Other (See Comments)    Joint pain   Praluent [Alirocumab]     Headaches, gastrointestinal issues   Statins     Myalgia, neck pain   Tamoxifen Nausea And Vomiting    CURRENT MEDICATIONS:    Current Outpatient Medications  Medication Sig Dispense Refill   aspirin 81 MG EC tablet Take 81 mg by mouth daily.      dapagliflozin propanediol (FARXIGA) 10 MG TABS tablet Take 1 tablet (10 mg total) by mouth daily. 90 tablet 3   dicyclomine (BENTYL) 10 MG capsule Take 1 capsule (10 mg total) by mouth 4 (four) times daily as needed for spasms. 120 capsule 0   digoxin (LANOXIN) 0.125 MG tablet Take 1 tablet by mouth once daily 90 tablet 3   ezetimibe (ZETIA) 10 MG tablet Take 1 tablet (10 mg total) by mouth daily. 90 tablet 1   HYDROcodone-acetaminophen (NORCO) 7.5-325 MG tablet Take 1 tablet by mouth every 6 (six) hours as needed for moderate pain. 30 tablet 0   HYDROcodone-acetaminophen (NORCO) 7.5-325 MG tablet Take 1 tablet by mouth every 6 (six) hours as needed for moderate pain. 30 tablet 0   ibuprofen (ADVIL) 200 MG tablet Take 200 mg by mouth as needed.     icosapent Ethyl (VASCEPA) 1 g capsule Take 2 capsules (2 g total) by mouth 2 (two) times daily. 120 capsule 0   ivabradine (CORLANOR) 7.5 MG TABS tablet Take 1 tablet (7.5 mg total) by mouth 2 (two) times daily with a meal. 180 tablet 3   LORATADINE PO Take by mouth daily.  [facility] brand     metoprolol succinate (TOPROL-XL) 50 MG 24 hr tablet Take 1 tablet by mouth twice daily 180 tablet 3   nitroGLYCERIN (NITROSTAT) 0.4 MG SL tablet Place 0.4 mg under the tongue every 5 (five) minutes as needed for chest pain.  omeprazole (PRILOSEC) 20 MG capsule Take 1 capsule by mouth once daily 90 capsule 0   pantoprazole (PROTONIX) 40 MG tablet Take 1 tablet (40 mg total) by mouth daily. 90 tablet 1   Phenazopyridine HCl (AZO TABS PO) Take 1 capsule by mouth as needed.     raloxifene (EVISTA) 60 MG tablet Take 1 tablet (60 mg total) by mouth daily. 90 tablet 3   spironolactone (ALDACTONE) 25 MG tablet Take 1 tablet (25 mg total) by mouth daily. 90 tablet 2   torsemide (DEMADEX) 100 MG tablet Take 0.5 tablets (50 mg total) by mouth daily. 45 tablet 3   No current facility-administered medications for this visit.    REVIEW OF SYSTEMS:   [X]  denotes positive finding, [ ]  denotes negative finding Cardiac  Comments:  Chest pain or chest pressure:    Shortness of breath upon exertion:    Short of breath when lying flat:    Irregular heart rhythm:        Vascular    Pain in calf, thigh, or hip brought on by ambulation:    Pain in feet at night that wakes you up from your sleep:     Blood clot in your veins:    Leg swelling:         Pulmonary    Oxygen at home:    Productive cough:     Wheezing:         Neurologic    Sudden weakness in arms or legs:     Sudden numbness in arms or legs:     Sudden onset of difficulty speaking or slurred speech:    Temporary loss of vision in one eye:     Problems with dizziness:         Gastrointestinal    Blood in stool:      Vomited blood:         Genitourinary    Burning when urinating:     Blood in urine:        Psychiatric    Major depression:         Hematologic    Bleeding problems:    Problems with blood clotting too easily:        Skin    Rashes or ulcers:        Constitutional    Fever or chills:      PHYSICAL EXAM:   Vitals:   08/18/23 1048  BP: 116/81  Pulse: 66  Resp: 20  Temp: 97.9 F (36.6 C)  SpO2: 96%  Weight: 206 lb 4.8 oz (93.6 kg)  Height: 5\' 5"  (1.651 m)    GENERAL: The patient is a well-nourished female, in no acute distress. The vital signs are documented above. CARDIAC: There is a regular rate and rhythm.  VASCULAR: Ultrasound was used to evaluate her carotid bifurcation which is in the mid neck.  No significant plaque was seen. PULMONARY: Nonlabored respirations MUSCULOSKELETAL: There are no major deformities or cyanosis. NEUROLOGIC: No focal weakness or paresthesias are detected. SKIN: There are no ulcers or rashes noted. PSYCHIATRIC: The patient has a normal affect.  STUDIES:   I have reviewed the following:  Right Carotid: There is no evidence of stenosis in the right ICA.   Left Carotid: There is no evidence of stenosis in the left ICA.   Vertebrals:  Bilateral vertebral arteries demonstrate antegrade flow.  Subclavians: Normal flow hemodynamics were seen in bilateral subclavian  arteries.  ASSESSMENT and PLAN   NYHA class II-III: The patient would be an excellent candidate for Barostim activation therapy.  Despite her goal-directed medical therapy, she is having persistent symptoms of fatigue and low energy.  I discussed the details of the operation as well as the risks and benefits.  All of her questions were answered.  We will work on Therapist, occupational and get her scheduled on the right side.   Charlena Cross, MD, FACS Vascular and Vein Specialists of [facility] [phone] Pager [phone]

## 2023-08-20 ENCOUNTER — Telehealth: Payer: Self-pay

## 2023-08-20 NOTE — Telephone Encounter (Signed)
I called patient and asked her if she had the Pro BNP drawn for Barostim procedure.  She said she has No clue.  Patient said did have a bunch of labwork drawn at Dr. Prescott Gum office.  She said she will see her Primary Care physician on 10/07 and wants to know if she can just get the Pro BNP drawn there if it was not already drawn.  (Dr. Blane Ohara).  I reached out to Dr. Prescott Gum Nurse and left a message on her voice mail to call me back for possible results.  Patient is aware and verbalized understanding.

## 2023-08-21 ENCOUNTER — Other Ambulatory Visit: Payer: Self-pay

## 2023-08-21 ENCOUNTER — Other Ambulatory Visit (HOSPITAL_COMMUNITY): Payer: Self-pay

## 2023-08-23 ENCOUNTER — Other Ambulatory Visit: Payer: Self-pay

## 2023-08-23 DIAGNOSIS — R899 Unspecified abnormal finding in specimens from other organs, systems and tissues: Secondary | ICD-10-CM

## 2023-08-25 ENCOUNTER — Ambulatory Visit: Payer: PPO

## 2023-08-25 DIAGNOSIS — R899 Unspecified abnormal finding in specimens from other organs, systems and tissues: Secondary | ICD-10-CM

## 2023-08-25 LAB — COMPREHENSIVE METABOLIC PANEL
ALT: 23 [IU]/L (ref 0–32)
AST: 18 [IU]/L (ref 0–40)
Albumin: 4.7 g/dL (ref 3.9–4.9)
Alkaline Phosphatase: 91 [IU]/L (ref 44–121)
BUN/Creatinine Ratio: 13 (ref 12–28)
BUN: 14 mg/dL (ref 8–27)
Bilirubin Total: 0.7 mg/dL (ref 0.0–1.2)
CO2: 27 mmol/L (ref 20–29)
Calcium: 10.5 mg/dL — ABNORMAL HIGH (ref 8.7–10.3)
Chloride: 90 mmol/L — ABNORMAL LOW (ref 96–106)
Creatinine, Ser: 1.07 mg/dL — ABNORMAL HIGH (ref 0.57–1.00)
Globulin, Total: 2.5 g/dL (ref 1.5–4.5)
Glucose: 194 mg/dL — ABNORMAL HIGH (ref 70–99)
Potassium: 3.8 mmol/L (ref 3.5–5.2)
Sodium: 137 mmol/L (ref 134–144)
Total Protein: 7.2 g/dL (ref 6.0–8.5)
eGFR: 59 mL/min/{1.73_m2} — ABNORMAL LOW (ref >59)

## 2023-08-25 NOTE — Progress Notes (Unsigned)
[facility]  [address] [place],  [place]  [address] [phone]  Clinic Day:  02/24/2023  Referring physician: Blane Ohara, MD  HISTORY OF PRESENT ILLNESS:  The patient is a 62 y.o. 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  She comes in today for routine follow-up.  Since her last visit, the patient has been doing fine.  Of note, she takes raloxifene for chemoprevention as tamoxifen caused significant nausea.  The patient denies having any particular changes in her breasts which concern her for disease recurrence.  Of note, her annual mammogram in July 2024 continued to show no evidence of disease recurrence  She is also followed for polycythemia.  Of note, JAK2 mutation testing came back negative.  At her last visit, her hemoglobin had completely normalized.  A recent CBC in late August 2024 showed a normal hemoglobin of 15.8.  PHYSICAL EXAM:  Blood pressure 134/65, pulse 74, temperature 98.3 F (36.8 C), resp. rate 16, height 5\' 5"  (1.651 m), weight 200 lb 3.2 oz (90.8 kg), SpO2 96%. Wt Readings from Last 3 Encounters:  08/26/23 200 lb 3.2 oz (90.8 kg)  08/18/23 206 lb 4.8 oz (93.6 kg)  07/10/23 202 lb (91.6 kg)   Body mass index is 33.32 kg/m. Performance status (ECOG): 0 - Asymptomatic Physical Exam Constitutional:      Appearance: Normal appearance.  HENT:     Mouth/Throat:     Pharynx: Oropharynx is clear. No oropharyngeal exudate.  Cardiovascular:     Rate and Rhythm: Normal rate and regular rhythm.     Heart sounds: No murmur heard.    No friction rub. No gallop.  Pulmonary:     Breath sounds: Normal breath sounds.  Chest:  Breasts:    Right: No swelling, bleeding, inverted nipple, mass, nipple discharge or skin change.     Left: No swelling, bleeding, inverted nipple, mass, nipple discharge or skin change.  Abdominal:     General: Bowel sounds are  normal. There is no distension.     Palpations: Abdomen is soft. There is no mass.     Tenderness: There is no abdominal tenderness.  Musculoskeletal:        General: No tenderness.     Cervical back: Normal range of motion and neck supple.     Right lower leg: No edema.     Left lower leg: No edema.  Lymphadenopathy:     Cervical: No cervical adenopathy.     Right cervical: No superficial, deep or posterior cervical adenopathy.    Left cervical: No superficial, deep or posterior cervical adenopathy.     Upper Body:     Right upper body: No supraclavicular or axillary adenopathy.     Left upper body: No supraclavicular or axillary adenopathy.     Lower Body: No right inguinal adenopathy. No left inguinal adenopathy.  Skin:    Coloration: Skin is not jaundiced.     Findings: No lesion or rash.  Neurological:     General: No focal deficit present.     Mental Status: She is alert and oriented to person, place, and time. Mental status is at baseline.  Psychiatric:        Mood and Affect: Mood normal.        Behavior: Behavior normal.        Thought Content: Thought content normal.        Judgment: Judgment  normal.     LABS:    Latest Reference Range & Units 07/10/23 11:24  WBC 3.4 - 10.8 x10E3/uL 6.6  RBC 3.77 - 5.28 x10E6/uL 5.03  Hemoglobin 11.1 - 15.9 g/dL 40.9  HCT 81.1 - 91.4 % 46.2  MCV 79 - 97 fL 92  MCH 26.6 - 33.0 pg 31.4  MCHC 31.5 - 35.7 g/dL 78.2  RDW 95.6 - 21.3 % 12.7  Platelets 150 - 450 x10E3/uL 255  Neutrophils Not Estab. % 63  Immature Granulocytes Not Estab. % 0  NEUT# 1.4 - 7.0 x10E3/uL 4.1  Lymphocyte # 0.7 - 3.1 x10E3/uL 1.7  Monocytes Absolute 0.1 - 0.9 x10E3/uL 0.6  Basophils Absolute 0.0 - 0.2 x10E3/uL 0.0  Immature Grans (Abs) 0.0 - 0.1 x10E3/uL 0.0  Lymphs Not Estab. % 26  Monocytes Not Estab. % 8  Basos Not Estab. % 1  Eos Not Estab. % 2    ASSESSMENT & PLAN:  A 62 y.o. 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   Based upon her clinical breast exam today, the patient remains disease-free.  She knows to continue taking raloxifene for her 5 years of chemoprevention.  With respect to her previous pseudopolycythemia, her hemoglobin has completely normalized.  Clinically, this patient is doing well.  I will see her back in 6 months for repeat clinical assessment.  The patient understands all the plans discussed today and is in agreement with them.  Shyniece Scripter Kirby Funk, MD

## 2023-08-26 ENCOUNTER — Inpatient Hospital Stay: Payer: PPO | Attending: Oncology | Admitting: Oncology

## 2023-08-26 VITALS — BP 134/65 | HR 74 | Temp 98.3°F | Resp 16 | Ht 65.0 in | Wt 200.2 lb

## 2023-08-26 DIAGNOSIS — D0510 Intraductal carcinoma in situ of unspecified breast: Secondary | ICD-10-CM

## 2023-08-27 ENCOUNTER — Telehealth: Payer: Self-pay | Admitting: Oncology

## 2023-08-27 ENCOUNTER — Encounter: Payer: Self-pay | Admitting: Family Medicine

## 2023-08-27 ENCOUNTER — Other Ambulatory Visit (HOSPITAL_COMMUNITY): Payer: Self-pay | Admitting: Cardiology

## 2023-08-27 DIAGNOSIS — I5022 Chronic systolic (congestive) heart failure: Secondary | ICD-10-CM

## 2023-08-27 NOTE — Progress Notes (Signed)
[facility] called to check status of pro bnp for barostim work up  Labs drawn 8/20-in process As this is a outside hospital lab done at [facility] order and result could be lost in transition. Pt will need re collection  Left message for pt with husband

## 2023-08-27 NOTE — Telephone Encounter (Signed)
Patient has been scheduled. Aware of appt date and time.   Entered by [name] A on 08/26/2023 at  7:49 PM Priority: Routine <No visit type provided>  Department: [facility]  Provider:  Scheduling Notes:  Appt 02-24-24

## 2023-08-28 ENCOUNTER — Telehealth: Payer: Self-pay

## 2023-08-28 NOTE — Telephone Encounter (Signed)
I called pt to see if she has had her Pro-BNP labwork drawn at [facility] yet.  Dr. Kandis Mannan office sent an order but I did not see the results.  I spoke to a rep from his office a few days ago to see if they had been in contact with the patient.  The Rep said she would follow up with the patient.  Patient will call someone by Tuesday if she has not heard anything.  I also called the Barostim Account Mgr to make her aware.

## 2023-09-01 ENCOUNTER — Other Ambulatory Visit: Payer: PPO

## 2023-09-11 ENCOUNTER — Ambulatory Visit (INDEPENDENT_AMBULATORY_CARE_PROVIDER_SITE_OTHER): Payer: PPO

## 2023-09-11 ENCOUNTER — Other Ambulatory Visit: Payer: Self-pay | Admitting: Family Medicine

## 2023-09-11 DIAGNOSIS — I773 Arterial fibromuscular dysplasia: Secondary | ICD-10-CM

## 2023-09-11 DIAGNOSIS — I420 Dilated cardiomyopathy: Secondary | ICD-10-CM

## 2023-09-11 LAB — CUP PACEART REMOTE DEVICE CHECK
Battery Voltage: 40
Date Time Interrogation Session: 20241024085422
Implantable Lead Connection Status: 753985
Implantable Lead Connection Status: 753985
Implantable Lead Implant Date: 20180410
Implantable Lead Implant Date: 20180410
Implantable Lead Location: 753859
Implantable Lead Location: 753860
Implantable Lead Model: 377
Implantable Lead Model: 402266
Implantable Lead Serial Number: 49794726
Implantable Lead Serial Number: 49838890
Implantable Pulse Generator Implant Date: 20180410
Pulse Gen Model: 404622
Pulse Gen Serial Number: 60982098

## 2023-09-11 MED ORDER — HYDROCODONE-ACETAMINOPHEN 7.5-325 MG PO TABS: 1.0000 | ORAL_TABLET | Freq: Four times a day (QID) | ORAL | 0 refills | Status: DC | PRN

## 2023-09-16 ENCOUNTER — Ambulatory Visit (HOSPITAL_COMMUNITY)
Admission: RE | Admit: 2023-09-16 | Discharge: 2023-09-16 | Disposition: A | Discharge status: Home / Self Care | Payer: PPO | Source: Ambulatory Visit | Attending: Internal Medicine

## 2023-09-16 DIAGNOSIS — I5022 Chronic systolic (congestive) heart failure: Secondary | ICD-10-CM | POA: Insufficient documentation

## 2023-09-16 NOTE — Addendum Note (Signed)
Addended by: Oskar Cretella, Milagros Reap on: 09/16/2023 02:11 PM   Modules accepted: Orders

## 2023-09-16 NOTE — Addendum Note (Signed)
Encounter addended by: Theresia Bough, CMA on: 09/16/2023 3:52 PM  Actions taken: Order list changed, Diagnosis association updated

## 2023-09-17 LAB — MISC LABCORP TEST (SEND OUT): Labcorp test code: 143000

## 2023-09-19 ENCOUNTER — Other Ambulatory Visit: Payer: Self-pay | Admitting: Family Medicine

## 2023-09-19 DIAGNOSIS — I25119 Atherosclerotic heart disease of native coronary artery with unspecified angina pectoris: Secondary | ICD-10-CM

## 2023-09-20 MED ORDER — ICOSAPENT ETHYL 1 G PO CAPS: 2.0000 g | ORAL_CAPSULE | Freq: Two times a day (BID) | ORAL | 0 refills | Status: DC

## 2023-09-30 ENCOUNTER — Telehealth: Payer: Self-pay

## 2023-09-30 NOTE — Telephone Encounter (Signed)
Contacted patient to schedule surgery for Barostim implant. Offered patient a few dates but she requested to call our office back to schedule because has a lot going on currently. Contact information provided.

## 2023-09-30 NOTE — Progress Notes (Signed)
Remote ICD transmission.   

## 2023-10-07 ENCOUNTER — Other Ambulatory Visit: Payer: Self-pay | Admitting: Family Medicine

## 2023-10-07 DIAGNOSIS — I773 Arterial fibromuscular dysplasia: Secondary | ICD-10-CM

## 2023-10-07 MED ORDER — HYDROCODONE-ACETAMINOPHEN 7.5-325 MG PO TABS: 1.0000 | ORAL_TABLET | Freq: Every day | ORAL | 0 refills | Status: DC | PRN

## 2023-10-09 ENCOUNTER — Telehealth: Payer: Self-pay

## 2023-10-09 NOTE — Patient Outreach (Signed)
Attempted to contact patient regarding care gaps. Left voicemail for patient to return my call at [phone].  Nicholes Rough, CMA Care Guide [facility] [employer]

## 2023-10-15 NOTE — Telephone Encounter (Signed)
Received a VM that patient is ready to schedule Barostim. Attempted to reach patient back, but no answer. Left VM for her to return call.

## 2023-10-20 ENCOUNTER — Other Ambulatory Visit: Payer: Self-pay

## 2023-10-20 DIAGNOSIS — I5042 Chronic combined systolic (congestive) and diastolic (congestive) heart failure: Secondary | ICD-10-CM

## 2023-10-20 NOTE — Telephone Encounter (Signed)
Patient scheduled for right barostim with Dr. Myra Gianotti on 11/14/23 at [facility]. Bi[employer]ronik device rep, Roosvelt Harps. and [facility] rep notified of date/time. Instructions provided to patient and she voiced understanding.

## 2023-10-26 NOTE — Progress Notes (Signed)
Subjective:  Patient ID: Allison White, female    DOB: 07/20/1961  Age: 62 y.o. MRN: [id number]  Chief Complaint  Patient presents with   Medical Management of Chronic Issues    HPI Diabetes: continue farxiga 10 mg daily. Does not check her sugars. A1C 6.8, overdue for eye exam.    CHF: taking torsemide 100 mg 1/2 tablet daily, spironolactone 25 mg daily, Digoxin 0.125 mg daily, ivabradine 7.5 g twice daily, farxiga 10 mg daily. Patient is scheduled to get a barostim in right carotid artery.    Hyperlipidemia: currently taking Aspirin 81 mg daily, Vascepa,  and maintaining a low cholesterol diet. Intolerant to statins/praluent. Stopped zetia.    Hypertension: taking metoprolol xl 50 mg twice daily, torsemide 100 mg 1/2 tablet daily, spironolactone 25 mg daily, Digoxin 0.125 mg daily, ivabradine 7.5 g twice daily, farxiga 10 mg daily.   Headaches- takes vicodin as needed. Takes ibuprofen for headaches.    Breast cancer: on evista.    GERD: on Pantoprazole 40 mg daily.  She reports significant improvement in her stomach issues after switching from omeprazole to pantoprazole. She had only one episode of diarrhea since the last visit.  The patient also reports persistent fatigue and low energy due to heart failure. She has stopped taking Zetia due to concerns about elevated liver enzymes but continues to take Vascepa (fish oil). She also takes ibuprofen and a prescription pain medication for chronic headaches, which are particularly severe when traveling to high altitudes.  The patient has been dealing with multiple stressors, including caring for a family member with cancer and the recent death of her stepfather.     Aug 03, 2023   11:07 AM 03/27/2023   11:03 AM 01/23/2023   10:38 AM 12/24/2022   11:15 AM 11/29/2021    1:35 PM  Depression screen PHQ 2/9  Decreased Interest 0 2 0 0 0  Down, Depressed, Hopeless 0 0 0 0 0  PHQ - 2 Score 0 2 0 0 0  Altered sleeping 2 2     Tired, decreased  energy 3 2     Change in appetite 2 2     Feeling bad or failure about yourself  0 0     Trouble concentrating 0 0     Moving slowly or fidgety/restless 2 1     Suicidal thoughts 0 0     PHQ-9 Score 9 9     Difficult doing work/chores Somewhat difficult Somewhat difficult           08/03/2023   11:06 AM  Fall Risk   Falls in the past year? 1  Number falls in past yr: 0  Injury with Fall? 0  Risk for fall due to : No Fall Risks;History of fall(s)    Patient Care Team: Blane Ohara, MD as PCP - General (Family Medicine) Regan Lemming, MD as PCP - Electrophysiology (Cardiology) Bensimhon, Bevelyn Buckles, MD as PCP - Advanced Heart Failure (Cardiology) Weston Settle, MD as Consulting Physician (Oncology) Lance Bosch, MD as Consulting Physician (Radiation Oncology) Zettie Pho, [facility] (Inactive) (Pharmacist) Julieanne Cotton, MD as Consulting Physician (Interventional Radiology) Weston Settle, MD as Consulting Physician (Oncology) Birdie Sons, OD (Ophthalmology) Misenheimer, Marcial Pacas, MD as Consulting Physician (Unknown Physician Specialty)   Review of Systems  Constitutional:  Positive for fatigue. Negative for appetite change and fever.  HENT:  Negative for congestion, ear pain, sinus pressure and sore throat.   Respiratory:  Positive for shortness of  breath. Negative for cough, chest tightness and wheezing.   Cardiovascular:  Positive for palpitations. Negative for chest pain.  Gastrointestinal:  Negative for abdominal pain, constipation, diarrhea, nausea and vomiting.  Genitourinary:  Negative for dysuria and hematuria.  Musculoskeletal:  Negative for arthralgias, back pain, joint swelling and myalgias.  Skin:  Negative for rash.  Neurological:  Positive for dizziness (More situational.) and headaches. Negative for weakness.  Psychiatric/Behavioral:  Negative for dysphoric mood. The patient is not nervous/anxious.     Current Outpatient Medications on  File Prior to Visit  Medication Sig Dispense Refill   aspirin 81 MG EC tablet Take 81 mg by mouth daily.      dapagliflozin propanediol (FARXIGA) 10 MG TABS tablet Take 1 tablet (10 mg total) by mouth daily. 90 tablet 3   dicyclomine (BENTYL) 10 MG capsule Take 1 capsule (10 mg total) by mouth 4 (four) times daily as needed for spasms. 120 capsule 0   digoxin (LANOXIN) 0.125 MG tablet Take 1 tablet by mouth once daily 90 tablet 3   HYDROcodone-acetaminophen (NORCO) 7.5-325 MG tablet Take 1 tablet by mouth every 6 (six) hours as needed for moderate pain. 30 tablet 0   HYDROcodone-acetaminophen (NORCO) 7.5-325 MG tablet Take 1 tablet by mouth daily as needed for moderate pain (pain score 4-6). 30 tablet 0   ibuprofen (ADVIL) 200 MG tablet Take 200 mg by mouth as needed.     ivabradine (CORLANOR) 7.5 MG TABS tablet Take 1 tablet (7.5 mg total) by mouth 2 (two) times daily with a meal. 180 tablet 3   LORATADINE PO Take by mouth daily. [facility] brand     metoprolol succinate (TOPROL-XL) 50 MG 24 hr tablet Take 1 tablet by mouth twice daily 180 tablet 3   nitroGLYCERIN (NITROSTAT) 0.4 MG SL tablet Place 0.4 mg under the tongue every 5 (five) minutes as needed for chest pain.     pantoprazole (PROTONIX) 40 MG tablet Take 1 tablet (40 mg total) by mouth daily. 90 tablet 1   raloxifene (EVISTA) 60 MG tablet Take 1 tablet (60 mg total) by mouth daily. 90 tablet 3   spironolactone (ALDACTONE) 25 MG tablet Take 1 tablet (25 mg total) by mouth daily. 90 tablet 2   torsemide (DEMADEX) 100 MG tablet Take 0.5 tablets (50 mg total) by mouth daily. 45 tablet 3   No current facility-administered medications on file prior to visit.   Past Medical History:  Diagnosis Date   Cancer (HCC) 2022   Rt Breast   CHF (congestive heart failure) (HCC)    FMD (facioscapulohumeral muscular dystrophy) (HCC)    Headache(784.0)    Heart failure (HCC)    Hyperlipidemia    MI (myocardial infarction) (HCC)    Migraine     Nonruptured cerebral aneurysm, internal carotid artery    Left side, stent placement (2009)   Pseudoaneurysm (HCC)    both carotids    Vertigo    Past Surgical History:  Procedure Laterality Date   ABDOMINAL HYSTERECTOMY  08/16/2004   Uterus and Cervix (per pathology)   BREAST LUMPECTOMY Right 07/2021   CARDIAC DEFIBRILLATOR PLACEMENT     CEREBRAL ANEURYSM REPAIR Left 2009   CESAREAN SECTION     CORONARY ANGIOPLASTY WITH STENT PLACEMENT     PACEMAKER IMPLANT     RIGHT/LEFT HEART CATH AND CORONARY ANGIOGRAPHY N/A 04/07/2020   Procedure: RIGHT/LEFT HEART CATH AND CORONARY ANGIOGRAPHY;  Surgeon: Dolores Patty, MD;  Location: [facility];  Service: Cardiovascular;  Laterality: N/A;   TUBAL LIGATION      Family History  Problem Relation Age of Onset   Thyroid disease Mother    Gastric cancer Mother 44       w/ signet ring features   Prostate cancer Father        d. 69   Hypertension Brother    Breast cancer Maternal Aunt 56   Brain cancer Maternal Aunt 15   Prostate cancer Paternal Uncle        x2 pat uncles   Prostate cancer Maternal Grandfather        metastatic; d. 85   Cerebral aneurysm Paternal Grandmother        Nonruptured   Dementia Paternal Grandfather    Prostate cancer Paternal Grandfather        metastatic   Social History   Socioeconomic History   Marital status: Married    Spouse name: Tim   Number of children: 4   Years of education: Not on file   Highest education level: High school graduate  Occupational History   Not on file  Tobacco Use   Smoking status: Former    Current packs/day: 1.00    Average packs/day: 1 pack/day for 6.0 years (6.0 ttl pk-yrs)    Types: Cigarettes   Smokeless tobacco: Never   Tobacco comments:    Quit over 30 years ago  Vaping Use   Vaping status: Never Used  Substance and Sexual Activity   Alcohol use: Yes    Comment: Occasionally "little"   Drug use: No   Sexual activity: Not Currently  Other Topics  Concern   Not on file  Social History Narrative   Lives with husband in a one-story home.     Right handed   Caffeine: "very little"   Social Drivers of Corporate investment banker Strain: Low Risk  (03/05/2023)   Overall Financial Resource Strain (CARDIA)    Difficulty of Paying Living Expenses: Not hard at all  Recent Concern: Financial Resource Strain - Medium Risk (01/23/2023)   Overall Financial Resource Strain (CARDIA)    Difficulty of Paying Living Expenses: Somewhat hard  Food Insecurity: No Food Insecurity (01/23/2023)   Hunger Vital Sign    Worried About Running Out of Food in the Last Year: Never true    Ran Out of Food in the Last Year: Never true  Transportation Needs: No Transportation Needs (03/05/2023)   PRAPARE - Administrator, Civil Service (Medical): No    Lack of Transportation (Non-Medical): No  Physical Activity: Sufficiently Active (01/23/2023)   Exercise Vital Sign    Days of Exercise per Week: 4 days    Minutes of Exercise per Session: 90 min  Stress: No Stress Concern Present (01/23/2023)   [employer] of Occupational Health - Occupational Stress Questionnaire    Feeling of Stress : Not at all  Social Connections: Moderately Integrated (10/26/2020)   Social Connection and Isolation Panel [NHANES]    Frequency of Communication with Friends and Family: More than three times a week    Frequency of Social Gatherings with Friends and Family: Twice a week    Attends Religious Services: 1 to 4 times per year    Active Member of [employer] or Organizations: No    Attends Banker Meetings: Never    Marital Status: Married    Objective:  BP 112/82 (BP Location: Left Arm, Patient Position: Sitting)   Pulse 73   Temp (!) 97.1  F (36.2 C) (Temporal)   Ht 5\' 5"  (1.651 m)   Wt 206 lb (93.4 kg)   SpO2 97%   BMI 34.28 kg/m      10/27/2023   10:19 AM 08/26/2023    2:30 PM 08/18/2023   10:48 AM  BP/Weight  Systolic BP 112 134 116  Diastolic  BP 82 65 81  Wt. (Lbs) 206 200.2 206.3  BMI 34.28 kg/m2 33.32 kg/m2 34.33 kg/m2    Physical Exam Vitals reviewed.  Constitutional:      Appearance: Normal appearance. She is obese.  Neck:     Vascular: No carotid bruit.  Cardiovascular:     Rate and Rhythm: Normal rate and regular rhythm.     Heart sounds: Murmur heard.  Pulmonary:     Effort: Pulmonary effort is normal. No respiratory distress.     Breath sounds: Normal breath sounds.  Abdominal:     General: Abdomen is flat. Bowel sounds are normal.     Palpations: Abdomen is soft.     Tenderness: There is no abdominal tenderness.  Neurological:     Mental Status: She is alert and oriented to person, place, and time.  Psychiatric:        Mood and Affect: Mood normal.        Behavior: Behavior normal.     Diabetic Foot Exam - Simple   Simple Foot Form Diabetic Foot exam was performed with the following findings: Yes 10/27/2023 11:02 AM  Visual Inspection No deformities, no ulcerations, no other skin breakdown bilaterally: Yes Sensation Testing Intact to touch and monofilament testing bilaterally: Yes Pulse Check Posterior Tibialis and Dorsalis pulse intact bilaterally: Yes Comments      Lab Results  Component Value Date   WBC 8.8 10/27/2023   HGB 17.8 (H) 10/27/2023   HCT 51.7 (H) 10/27/2023   PLT 304 10/27/2023   GLUCOSE 119 (H) 10/27/2023   CHOL 308 (H) 10/27/2023   TRIG 311 (H) 10/27/2023   HDL 54 10/27/2023   LDLCALC 193 (H) 10/27/2023   ALT 20 10/27/2023   AST 20 10/27/2023   NA 142 10/27/2023   K 4.2 10/27/2023   CL 97 10/27/2023   CREATININE 0.94 10/27/2023   BUN 15 10/27/2023   CO2 25 10/27/2023   TSH 3.720 12/24/2022   INR 1.0 04/04/2020   HGBA1C 6.9 (H) 10/27/2023      Assessment & Plan:    Mixed hyperlipidemia Assessment & Plan: Stopped Zetia due to concerns of elevated liver enzymes. Currently on Vascepa. -Continue Vascepa. Monitor liver enzymes.  Orders: -     Lipid  panel  Hypertension associated with diabetes (HCC) Assessment & Plan: Control: good Recommend check feet daily. Recommend annual eye exams. Medicines: continue farxiga 10 mg daily  Last A1c was 6.8. Patient not currently checking blood sugars.  Encourage patient to monitor blood sugars regularly. Continue to work on eating a healthy diet and exercise.  Labs drawn today.     Orders: -     CBC with Differential/Platelet -     Comprehensive metabolic panel -     Hemoglobin A1c  Encounter for immunization -     Influenza, MDCK, trivalent, PF(Flucelvax egg-free) Best boy Vaccine 48yrs & older  Chronic migraine without aura, with intractable migraine, so stated, with status migrainosus Assessment & Plan: Reports occasional migraines, managed with ibuprofen and prescription pain medication. -Continue current management. Monitor for rebound headaches.   Chronic systolic congestive heart failure (  HCC) Assessment & Plan: Patient reports persistent symptoms of fatigue and low energy. Scheduled for Barostim implant on 11/14/2023. -Continue current heart failure management. Follow up after Barostim implant.   Gastroesophageal reflux disease without esophagitis Assessment & Plan: Improved symptoms with Pantoprazole. No recent episodes of severe diarrhea. -Continue Pantoprazole.   No orders of the defined types were placed in this encounter.   Orders Placed This Encounter  Procedures   Influenza, MDCK, trivalent, PF(Flucelvax egg-free)   Pfizer Comirnaty Covid-19 Vaccine 42yrs & older   CBC with Differential/Platelet   Comprehensive metabolic panel   Hemoglobin A1c   Lipid panel     Follow-up: Return in about 3 months (around 01/25/2024) for chronic follow up.   I,Marla I Leal-Borjas,acting as a scribe for Blane Ohara, MD.,have documented all relevant documentation on the behalf of Blane Ohara, MD,as directed by  Blane Ohara, MD while in the presence of  Blane Ohara, MD.   An After Visit Summary was printed and given to the patient. I attest that I have reviewed this visit and agree with the plan scribed by my staff.  I attest that I have reviewed this visit and agree with the plan scribed by my staff.   Blane Ohara, MD [facility] [phone]

## 2023-10-27 ENCOUNTER — Ambulatory Visit (INDEPENDENT_AMBULATORY_CARE_PROVIDER_SITE_OTHER): Payer: PPO | Admitting: Family Medicine

## 2023-10-27 ENCOUNTER — Encounter: Payer: Self-pay | Admitting: Family Medicine

## 2023-10-27 VITALS — BP 112/82 | HR 73 | Temp 97.1°F | Ht 65.0 in | Wt 206.0 lb

## 2023-10-27 DIAGNOSIS — E1159 Type 2 diabetes mellitus with other circulatory complications: Secondary | ICD-10-CM

## 2023-10-27 DIAGNOSIS — Z23 Encounter for immunization: Secondary | ICD-10-CM

## 2023-10-27 DIAGNOSIS — I152 Hypertension secondary to endocrine disorders: Secondary | ICD-10-CM

## 2023-10-27 DIAGNOSIS — E782 Mixed hyperlipidemia: Secondary | ICD-10-CM

## 2023-10-27 DIAGNOSIS — G43711 Chronic migraine without aura, intractable, with status migrainosus: Secondary | ICD-10-CM | POA: Diagnosis not present

## 2023-10-27 DIAGNOSIS — I5022 Chronic systolic (congestive) heart failure: Secondary | ICD-10-CM

## 2023-10-27 DIAGNOSIS — K219 Gastro-esophageal reflux disease without esophagitis: Secondary | ICD-10-CM

## 2023-10-27 NOTE — Patient Instructions (Signed)
VISIT SUMMARY:  During today's visit, we discussed your ongoing health issues, including diabetes, heart failure, GERD, hyperlipidemia, and migraines. We also reviewed your recent improvements and areas needing attention, such as overdue eye exams and immunizations.  YOUR PLAN:  -GASTROESOPHAGEAL REFLUX DISEASE (GERD): GERD is a condition where stomach acid frequently flows back into the tube connecting your mouth and stomach. Your symptoms have improved with Pantoprazole, and you should continue taking it as prescribed.  -TYPE 2 DIABETES MELLITUS: Type 2 diabetes is a chronic condition that affects the way your body processes blood sugar. Your last A1c was 6.8, which is good, but you need to monitor your blood sugars regularly. Continue taking Marcelline Deist as prescribed.  -ROUTINE EYE EXAM: Regular eye exams are important for detecting changes in vision and eye health, especially with diabetes. You are overdue for an eye exam, so please schedule one as soon as possible.  -HEART FAILURE:  Continue with your current management plan and follow up after your scheduled Barostim implant on 11/14/2023.  -HYPERLIPIDEMIA: Hyperlipidemia is having high levels of fats in your blood. We stopped your Zetia due to liver enzyme concerns but should continue taking Vascepa. We will monitor your liver enzymes.  -IMMUNIZATIONS: Staying up-to-date with immunizations is important for preventing infections. You received your flu shot and COVID booster, and you should get the tetanus, rsv, and shingles vaccines at the pharmacy.  INSTRUCTIONS:  Please follow up after your Barostim implant. We will also do blood work today. Make sure to schedule an eye exam as soon as possible.

## 2023-10-28 ENCOUNTER — Other Ambulatory Visit: Payer: Self-pay | Admitting: Family Medicine

## 2023-10-28 DIAGNOSIS — I25119 Atherosclerotic heart disease of native coronary artery with unspecified angina pectoris: Secondary | ICD-10-CM

## 2023-10-28 LAB — CBC WITH DIFFERENTIAL/PLATELET
Basophils Absolute: 0 10*3/uL (ref 0.0–0.2)
Basos: 1 %
EOS (ABSOLUTE): 0.2 10*3/uL (ref 0.0–0.4)
Eos: 2 %
Hematocrit: 51.7 % — ABNORMAL HIGH (ref 34.0–46.6)
Hemoglobin: 17.8 g/dL — ABNORMAL HIGH (ref 11.1–15.9)
Immature Grans (Abs): 0 10*3/uL (ref 0.0–0.1)
Immature Granulocytes: 0 %
Lymphocytes Absolute: 2.4 10*3/uL (ref 0.7–3.1)
Lymphs: 27 %
MCH: 32.1 pg (ref 26.6–33.0)
MCHC: 34.4 g/dL (ref 31.5–35.7)
MCV: 93 fL (ref 79–97)
Monocytes Absolute: 0.7 10*3/uL (ref 0.1–0.9)
Monocytes: 8 %
Neutrophils Absolute: 5.5 10*3/uL (ref 1.4–7.0)
Neutrophils: 62 %
Platelets: 304 10*3/uL (ref 150–450)
RBC: 5.54 x10E6/uL — ABNORMAL HIGH (ref 3.77–5.28)
RDW: 12.5 % (ref 11.7–15.4)
WBC: 8.8 10*3/uL (ref 3.4–10.8)

## 2023-10-28 LAB — LIPID PANEL
Chol/HDL Ratio: 5.7 {ratio} — ABNORMAL HIGH (ref 0.0–4.4)
Cholesterol, Total: 308 mg/dL — ABNORMAL HIGH (ref 100–199)
HDL: 54 mg/dL (ref >39)
LDL Chol Calc (NIH): 193 mg/dL — ABNORMAL HIGH (ref 0–99)
Triglycerides: 311 mg/dL — ABNORMAL HIGH (ref 0–149)
VLDL Cholesterol Cal: 61 mg/dL — ABNORMAL HIGH (ref 5–40)

## 2023-10-28 LAB — COMPREHENSIVE METABOLIC PANEL
ALT: 20 [IU]/L (ref 0–32)
AST: 20 [IU]/L (ref 0–40)
Albumin: 4.7 g/dL (ref 3.9–4.9)
Alkaline Phosphatase: 96 [IU]/L (ref 44–121)
BUN/Creatinine Ratio: 16 (ref 12–28)
BUN: 15 mg/dL (ref 8–27)
Bilirubin Total: 0.5 mg/dL (ref 0.0–1.2)
CO2: 25 mmol/L (ref 20–29)
Calcium: 10 mg/dL (ref 8.7–10.3)
Chloride: 97 mmol/L (ref 96–106)
Creatinine, Ser: 0.94 mg/dL (ref 0.57–1.00)
Globulin, Total: 2.7 g/dL (ref 1.5–4.5)
Glucose: 119 mg/dL — ABNORMAL HIGH (ref 70–99)
Potassium: 4.2 mmol/L (ref 3.5–5.2)
Sodium: 142 mmol/L (ref 134–144)
Total Protein: 7.4 g/dL (ref 6.0–8.5)
eGFR: 69 mL/min/{1.73_m2} (ref >59)

## 2023-10-28 LAB — HEMOGLOBIN A1C
Est. average glucose Bld gHb Est-mCnc: 151 mg/dL
Hgb A1c MFr Bld: 6.9 % — ABNORMAL HIGH (ref 4.8–5.6)

## 2023-10-29 ENCOUNTER — Other Ambulatory Visit: Payer: Self-pay | Admitting: Family Medicine

## 2023-10-29 DIAGNOSIS — E782 Mixed hyperlipidemia: Secondary | ICD-10-CM

## 2023-10-29 MED ORDER — REPATHA SURECLICK 140 MG/ML ~~LOC~~ SOAJ: 140.0000 mg | SUBCUTANEOUS | 2 refills | Status: DC

## 2023-10-29 NOTE — Progress Notes (Signed)
Pt agreed to repatha.  Stay off zetia.  Start fenofibrate 60 mg once daily.  Continue vascepa 1 gm 2 capsules twice a day.

## 2023-10-31 ENCOUNTER — Telehealth (HOSPITAL_COMMUNITY): Payer: Self-pay | Admitting: Pharmacy Technician

## 2023-10-31 NOTE — Telephone Encounter (Signed)
Advanced Heart Failure Patient Advocate Encounter  The patient was approved for a [employer] grant that will help cover the cost of Corlanor and [place]. Total amount awarded, $10,000. Eligibility, 10/01/23 - 09/29/24.  ID [id number]  BIN [id number]  PCN [id number]  Group [id number]

## 2023-11-01 NOTE — Assessment & Plan Note (Signed)
Patient reports persistent symptoms of fatigue and low energy. Scheduled for Barostim implant on 11/14/2023. -Continue current heart failure management. Follow up after Barostim implant.

## 2023-11-01 NOTE — Assessment & Plan Note (Signed)
Reports occasional migraines, managed with ibuprofen and prescription pain medication. -Continue current management. Monitor for rebound headaches.

## 2023-11-01 NOTE — Assessment & Plan Note (Addendum)
Control: good Recommend check feet daily. Recommend annual eye exams. Medicines: continue farxiga 10 mg daily  Last A1c was 6.8. Patient not currently checking blood sugars.  Encourage patient to monitor blood sugars regularly. Continue to work on eating a healthy diet and exercise.  Labs drawn today.

## 2023-11-01 NOTE — Assessment & Plan Note (Signed)
Stopped Zetia due to concerns of elevated liver enzymes. Currently on Vascepa. -Continue Vascepa. Monitor liver enzymes.

## 2023-11-01 NOTE — Assessment & Plan Note (Signed)
Improved symptoms with Pantoprazole. No recent episodes of severe diarrhea. -Continue Pantoprazole.

## 2023-11-04 ENCOUNTER — Other Ambulatory Visit: Payer: Self-pay

## 2023-11-04 ENCOUNTER — Ambulatory Visit (INDEPENDENT_AMBULATORY_CARE_PROVIDER_SITE_OTHER): Payer: PPO

## 2023-11-04 VITALS — BP 110/70 | HR 77 | Temp 97.7°F | Resp 16 | Ht 65.0 in | Wt 202.6 lb

## 2023-11-04 DIAGNOSIS — E119 Type 2 diabetes mellitus without complications: Secondary | ICD-10-CM

## 2023-11-04 DIAGNOSIS — E782 Mixed hyperlipidemia: Secondary | ICD-10-CM | POA: Diagnosis not present

## 2023-11-04 DIAGNOSIS — I773 Arterial fibromuscular dysplasia: Secondary | ICD-10-CM

## 2023-11-04 DIAGNOSIS — R519 Headache, unspecified: Secondary | ICD-10-CM

## 2023-11-04 NOTE — Patient Instructions (Signed)
VISIT SUMMARY:  During today's visit, we discussed your new type of headache, which is different from your usual migraines. We also reviewed your recent blood work and discussed your diabetes and cholesterol management. Your blood pressure and heart rate are normal, and your recent labs show normal kidney and liver function.  YOUR PLAN:  -HEADACHE: You have a new headache localized to the right forehead that has been persisting for five days and is worse at night. This headache is different from your usual migraines. It could be an atypical migraine, tension-type headache, cluster headache, or potentially a stroke. If your headache pain reaches a severe level (10/10), you should seek emergency care for an immediate CT scan or CT angiogram. Continue taking ibuprofen and Vicodin as needed, stay hydrated, and monitor your headache pattern. Watch for any rashes as they could be a sign of shingles.  -DIABETES MELLITUS: Your recent blood work shows elevated blood sugar levels, indicating diabetes. It is important to monitor your blood sugar levels, especially during headache episodes, to manage your diabetes effectively. Please discuss a diabetes management plan with your primary care provider.  -HYPERLIPIDEMIA: Your recent labs show elevated cholesterol levels, with a total cholesterol of 311 mg/dL and LDL of 161 mg/dL. You have not been tolerating statins and are awaiting insurance approval for Repatha injections. Lowering your cholesterol is important to prevent strokes and cardiovascular events. Start Repatha injections once they are available, monitor your cholesterol levels, and maintain a low-fat diet.  -GENERAL HEALTH MAINTENANCE: Your recent blood work shows normal electrolytes, kidney function, and liver function. Your blood pressure and heart rate are within normal limits. Continue regular follow-up with your primary care provider and maintain a healthy lifestyle with a balanced diet and regular  exercise.  INSTRUCTIONS:  Follow up with your primary care provider for diabetes management. Ensure Repatha injections are started once available. Seek emergency care if headache pain reaches 10/10.

## 2023-11-04 NOTE — Progress Notes (Unsigned)
Acute Office Visit  Subjective:    Patient ID: Allison White, female    DOB: 07-29-1961, 62 y.o.   MRN: [id number]  Chief Complaint  Patient presents with   Headache    Discussed the use of AI scribe software for clinical note transcription with the patient, who gave verbal consent to proceed.      HPI: Patient presents today with headache that is different than usual. Patient stated she has had some vomiting on one occasion. The patient, with a complex medical history including brain stents, carotid artery dissections, and a history of stroke, presents with a new type of headache. The headache, localized to the right forehead, is described as different from her usual migraines, which typically start at the back of the head and progress forward. This new headache is worse at night and has been unresponsive to her usual regimen of Vicodin and ibuprofen. The headache started on a Friday and by Saturday had escalated to a severe level, accompanied by stomach upset and vomiting.  The patient has a history of migraines, but this headache does not follow her typical pattern. The patient's carotid arteries are dissected, with stents in the left carotid artery. 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  The patient also has a history of cancer, as do her mother and husband, which has disrupted her regular follow-up with a neurologist. She has tried various treatments for her usual migraines, with ibuprofen and Vicodin being the most effective.  The patient also has a history of high cholesterol, with a recent total cholesterol level of 308 and LDL of 193. She was previously on statin therapy but had to discontinue due to side effects. She has recently started on Repatha injections for cholesterol management.  The patient's blood pressure is reported as normal, and she has no new visual disturbances or weakness in her hands or  legs. The patient does report occasional visual disturbances described as "cracked glass," but these are not constant. The patient denies any new numbness, but does report a history of a numb patch on her left cheek when her usual migraines act up.  The patient's headache pain has been as high as 10/10, but at the time of the consultation was reported as 3/4. The patient has been managing the pain with Vicodin and ibuprofen, but reports that the medications are less effective for this new type of headache. The patient has a history of stroke from a carotid dissection approximately 13 years ago.  Past Medical History:  Diagnosis Date   Cancer (HCC) 2022   Rt Breast   CHF (congestive heart failure) (HCC)    FMD (facioscapulohumeral muscular dystrophy) (HCC)    Headache(784.0)    Heart failure (HCC)    Hyperlipidemia    MI (myocardial infarction) (HCC)    Migraine    Nonruptured cerebral aneurysm, internal carotid artery    Left side, stent placement (2009)   Pseudoaneurysm (HCC)    both carotids    Vertigo     Past Surgical History:  Procedure Laterality Date   ABDOMINAL HYSTERECTOMY  08/16/2004   Uterus and Cervix (per pathology)   BREAST LUMPECTOMY Right 07/2021   CARDIAC DEFIBRILLATOR PLACEMENT     CEREBRAL ANEURYSM REPAIR Left 2009   CESAREAN SECTION     CORONARY ANGIOPLASTY WITH STENT PLACEMENT     PACEMAKER IMPLANT     RIGHT/LEFT HEART CATH AND CORONARY ANGIOGRAPHY N/A 04/07/2020  Procedure: RIGHT/LEFT HEART CATH AND CORONARY ANGIOGRAPHY;  Surgeon: Dolores Patty, MD;  Location: [facility];  Service: Cardiovascular;  Laterality: N/A;   TUBAL LIGATION      Family History  Problem Relation Age of Onset   Thyroid disease Mother    Gastric cancer Mother 15       w/ signet ring features   Prostate cancer Father        d. 59   Hypertension Brother    Breast cancer Maternal Aunt 40   Brain cancer Maternal Aunt 46   Prostate cancer Paternal Uncle        x2  pat uncles   Prostate cancer Maternal Grandfather        metastatic; d. 77   Cerebral aneurysm Paternal Grandmother        Nonruptured   Dementia Paternal Grandfather    Prostate cancer Paternal Grandfather        metastatic    Social History   Socioeconomic History   Marital status: Married    Spouse name: Tim   Number of children: 4   Years of education: Not on file   Highest education level: High school graduate  Occupational History   Not on file  Tobacco Use   Smoking status: Former    Current packs/day: 1.00    Average packs/day: 1 pack/day for 6.0 years (6.0 ttl pk-yrs)    Types: Cigarettes   Smokeless tobacco: Never   Tobacco comments:    Quit over 30 years ago  Vaping Use   Vaping status: Never Used  Substance and Sexual Activity   Alcohol use: Yes    Comment: Occasionally "little"   Drug use: No   Sexual activity: Not Currently  Other Topics Concern   Not on file  Social History Narrative   Lives with husband in a one-story home.     Right handed   Caffeine: "very little"   Social Drivers of Corporate investment banker Strain: Low Risk  (03/05/2023)   Overall Financial Resource Strain (CARDIA)    Difficulty of Paying Living Expenses: Not hard at all  Recent Concern: Financial Resource Strain - Medium Risk (01/23/2023)   Overall Financial Resource Strain (CARDIA)    Difficulty of Paying Living Expenses: Somewhat hard  Food Insecurity: No Food Insecurity (01/23/2023)   Hunger Vital Sign    Worried About Running Out of Food in the Last Year: Never true    Ran Out of Food in the Last Year: Never true  Transportation Needs: No Transportation Needs (03/05/2023)   PRAPARE - Administrator, Civil Service (Medical): No    Lack of Transportation (Non-Medical): No  Physical Activity: Sufficiently Active (01/23/2023)   Exercise Vital Sign    Days of Exercise per Week: 4 days    Minutes of Exercise per Session: 90 min  Stress: No Stress Concern Present  (01/23/2023)   [employer] of Occupational Health - Occupational Stress Questionnaire    Feeling of Stress : Not at all  Social Connections: Moderately Integrated (10/26/2020)   Social Connection and Isolation Panel [NHANES]    Frequency of Communication with Friends and Family: More than three times a week    Frequency of Social Gatherings with Friends and Family: Twice a week    Attends Religious Services: 1 to 4 times per year    Active Member of [employer] or Organizations: No    Attends Banker Meetings: Never    Marital  Status: Married  Catering manager Violence: Not At Risk (01/23/2023)   Humiliation, Afraid, Rape, and Kick questionnaire    Fear of Current or Ex-Partner: No    Emotionally Abused: No    Physically Abused: No    Sexually Abused: No    Outpatient Medications Prior to Visit  Medication Sig Dispense Refill   aspirin 81 MG EC tablet Take 81 mg by mouth daily.      dapagliflozin propanediol (FARXIGA) 10 MG TABS tablet Take 1 tablet (10 mg total) by mouth daily. 90 tablet 3   digoxin (LANOXIN) 0.125 MG tablet Take 1 tablet by mouth once daily 90 tablet 3   Evolocumab (REPATHA SURECLICK) 140 MG/ML SOAJ Inject 140 mg into the skin every 14 (fourteen) days. 2 mL 2   ibuprofen (ADVIL) 200 MG tablet Take 200-400 mg by mouth every 6 (six) hours as needed for moderate pain (pain score 4-6).     icosapent Ethyl (VASCEPA) 1 g capsule Take 2 capsules by mouth twice daily 120 capsule 3   ivabradine (CORLANOR) 7.5 MG TABS tablet Take 1 tablet (7.5 mg total) by mouth 2 (two) times daily with a meal. 180 tablet 3   loratadine (CLARITIN) 10 MG tablet Take 10 mg by mouth daily. [facility] brand     metoprolol succinate (TOPROL-XL) 50 MG 24 hr tablet Take 1 tablet by mouth twice daily 180 tablet 3   nitroGLYCERIN (NITROSTAT) 0.4 MG SL tablet Place 0.4 mg under the tongue every 5 (five) minutes as needed for chest pain.     pantoprazole (PROTONIX) 40 MG tablet Take 1 tablet (40  mg total) by mouth daily. 90 tablet 1   Phenazopyridine HCl (AZO TABS PO) Take 1 tablet by mouth daily as needed (painful urination).     raloxifene (EVISTA) 60 MG tablet Take 1 tablet (60 mg total) by mouth daily. 90 tablet 3   spironolactone (ALDACTONE) 25 MG tablet Take 1 tablet (25 mg total) by mouth daily. 90 tablet 2   torsemide (DEMADEX) 100 MG tablet Take 0.5 tablets (50 mg total) by mouth daily. 45 tablet 3   HYDROcodone-acetaminophen (NORCO) 7.5-325 MG tablet Take 1 tablet by mouth daily as needed for moderate pain (pain score 4-6). 30 tablet 0   dicyclomine (BENTYL) 10 MG capsule Take 1 capsule (10 mg total) by mouth 4 (four) times daily as needed for spasms. (Patient not taking: Reported on 11/04/2023) 120 capsule 0   HYDROcodone-acetaminophen (NORCO) 7.5-325 MG tablet Take 1 tablet by mouth every 6 (six) hours as needed for moderate pain. 30 tablet 0   No facility-administered medications prior to visit.    Allergies  Allergen Reactions   Tape Rash    PREFERS CLOTH OR NOTHING   Plavix [Clopidogrel Bisulfate] Other (See Comments)    Joint pain   Praluent [Alirocumab]     Headaches, gastrointestinal issues   Statins     Myalgia, neck pain   Tamoxifen Nausea And Vomiting    Review of Systems  Constitutional: Negative.   Gastrointestinal:  Positive for vomiting.  Neurological:  Positive for headaches.       Objective:        11/04/2023    3:11 PM 10/27/2023   10:19 AM 08/26/2023    2:30 PM  Vitals with BMI  Height 5\' 5"  5\' 5"  5\' 5"   Weight 202 lbs 10 oz 206 lbs 200 lbs 3 oz  BMI 33.71 34.28 33.31  Systolic 110 112 829  Diastolic 70 82 65  Pulse 77 73 74    Orthostatic VS for the past 72 hrs (Last 3 readings):  Patient Position BP Location Cuff Size  11/04/23 1511 Sitting Right Arm Large     Physical Exam Vitals and nursing note reviewed.  Constitutional:      Comments: VITALS: BP- 110/70 HEENT: Pupils equal, reactive bilaterally. Oral mucosa without  lesions. No scalp tenderness on palpation of right forehead. CHEST: Lungs clear to auscultation. CARDIOVASCULAR: Heart sounds normal. NEUROLOGICAL: Cranial nerves intact. Normal muscle strength in upper extremities. Sensation symmetric and intact. No facial asymmetry with facial movements including eye closure, cheek puffing, jaw clenching, and shoulder shrugging. Coordinated movements without difficulty.     Health Maintenance Due  Topic Date Due   DTaP/Tdap/Td (1 - Tdap) Never done   Zoster Vaccines- Shingrix (1 of 2) Never done   OPHTHALMOLOGY EXAM  10/25/2021    There are no preventive care reminders to display for this patient.   Lab Results  Component Value Date   TSH 3.720 12/24/2022   Lab Results  Component Value Date   WBC 8.8 10/27/2023   HGB 17.8 (H) 10/27/2023   HCT 51.7 (H) 10/27/2023   MCV 93 10/27/2023   PLT 304 10/27/2023   Lab Results  Component Value Date   NA 142 10/27/2023   K 4.2 10/27/2023   CO2 25 10/27/2023   GLUCOSE 119 (H) 10/27/2023   BUN 15 10/27/2023   CREATININE 0.94 10/27/2023   BILITOT 0.5 10/27/2023   ALKPHOS 96 10/27/2023   AST 20 10/27/2023   ALT 20 10/27/2023   PROT 7.4 10/27/2023   ALBUMIN 4.7 10/27/2023   CALCIUM 10.0 10/27/2023   ANIONGAP 14 01/09/2023   EGFR 69 10/27/2023   GFR 71.82 07/22/2013   Lab Results  Component Value Date   CHOL 308 (H) 10/27/2023   Lab Results  Component Value Date   HDL 54 10/27/2023   Lab Results  Component Value Date   LDLCALC 193 (H) 10/27/2023   Lab Results  Component Value Date   TRIG 311 (H) 10/27/2023   Lab Results  Component Value Date   CHOLHDL 5.7 (H) 10/27/2023   Lab Results  Component Value Date   HGBA1C 6.9 (H) 10/27/2023       Assessment & Plan:  Chronic daily headache Assessment & Plan: New headache localized to the right forehead, persisting for five days, worse at night. History of migraines, carotid artery dissection, and stents. Differential includes  atypical migraine, tension-type headache, cluster headache, and potential stroke. No new neurological deficits on examination. Stroke cannot be ruled out without imaging. Discussed emergency care for severe pain (10/10) for immediate CT scan or CT angiogram. Outpatient imaging may not be timely for acute stroke evaluation. - Seek emergency care if headache pain reaches 10/10 for immediate CT scan or CT angiogram - Continue ibuprofen and Vicodin as needed - Stay hydrated and monitor headache pattern - Watch for rashes as a potential sign of shingles due to a very localized area of pain on her right forehead   Controlled type 2 diabetes mellitus without complication, without long-term current use of insulin (HCC) Assessment & Plan: Recent blood work shows elevated blood sugar with an A1c  of 6.9 indicative of diabetes. Not regularly monitoring blood glucose levels. Discussed importance of monitoring, especially during headache episodes, to manage diabetes effectively. - Monitor blood sugar levels, especially during headache episodes - Discuss diabetes management plan with primary care provider   Mixed hyperlipidemia Assessment & Plan:  Elevated cholesterol with recent labs showing total cholesterol of 311 mg/dL and LDL of 161 mg/dL. Not tolerating statins, awaiting insurance approval for Repatha injections. Discussed importance of lowering cholesterol to prevent strokes and cardiovascular events. - Start Repatha injections once available - Monitor cholesterol levels - Maintain a low-fat diet   General Health Maintenance Recent blood work shows normal electrolytes, kidney function, and liver function. Blood pressure and heart rate are within normal limits. - Continue regular follow-up with primary care provider - Maintain a healthy lifestyle with a balanced diet and regular exercise  Follow-up - Follow up with primary care provider for diabetes management - Ensure Repatha injections are  started once available - Seek emergency care if headache pain reaches 10/10.      No orders of the defined types were placed in this encounter.   No orders of the defined types were placed in this encounter.    Follow-up: Return if symptoms worsen or fail to improve.  An After Visit Summary was printed and given to the patient.  Windell Moment, MD [facility] [phone]

## 2023-11-05 ENCOUNTER — Telehealth: Payer: Self-pay

## 2023-11-05 DIAGNOSIS — N183 Chronic kidney disease, stage 3 unspecified: Secondary | ICD-10-CM | POA: Insufficient documentation

## 2023-11-05 DIAGNOSIS — E119 Type 2 diabetes mellitus without complications: Secondary | ICD-10-CM | POA: Insufficient documentation

## 2023-11-05 MED ORDER — HYDROCODONE-ACETAMINOPHEN 7.5-325 MG PO TABS: 1.0000 | ORAL_TABLET | Freq: Every day | ORAL | 0 refills | Status: AC | PRN

## 2023-11-05 NOTE — Assessment & Plan Note (Signed)
New headache localized to the right forehead, persisting for five days, worse at night. History of migraines, carotid artery dissection, and stents. Differential includes atypical migraine, tension-type headache, cluster headache, and potential stroke. No new neurological deficits on examination. Stroke cannot be ruled out without imaging. Discussed emergency care for severe pain (10/10) for immediate CT scan or CT angiogram. Outpatient imaging may not be timely for acute stroke evaluation. - Seek emergency care if headache pain reaches 10/10 for immediate CT scan or CT angiogram - Continue ibuprofen and Vicodin as needed - Stay hydrated and monitor headache pattern - Watch for rashes as a potential sign of shingles due to a very localized area of pain on her right forehead

## 2023-11-05 NOTE — Assessment & Plan Note (Addendum)
Elevated cholesterol with recent labs showing total cholesterol of 311 mg/dL and LDL of 161 mg/dL. Not tolerating statins, awaiting insurance approval for Repatha injections. Discussed importance of lowering cholesterol to prevent strokes and cardiovascular events. - Start Repatha injections once available - Monitor cholesterol levels - Maintain a low-fat diet   General Health Maintenance Recent blood work shows normal electrolytes, kidney function, and liver function. Blood pressure and heart rate are within normal limits. - Continue regular follow-up with primary care provider - Maintain a healthy lifestyle with a balanced diet and regular exercise  Follow-up - Follow up with primary care provider for diabetes management - Ensure Repatha injections are started once available - Seek emergency care if headache pain reaches 10/10.

## 2023-11-05 NOTE — Telephone Encounter (Signed)
Copied from CRM [id number]. Topic: Clinical - Prescription Issue >> Nov 05, 2023 10:36 AM Georgeanna Harrison H wrote: Reason for CRM: pt states that insurance has not approved repatha shots and she is wondering if there is anything else she can take till it is approved and pharmacy states that they do not see the hydrocodone that was sent in either.

## 2023-11-05 NOTE — Assessment & Plan Note (Signed)
Recent blood work shows elevated blood sugar with an A1c  of 6.9 indicative of diabetes. Not regularly monitoring blood glucose levels. Discussed importance of monitoring, especially during headache episodes, to manage diabetes effectively. - Monitor blood sugar levels, especially during headache episodes - Discuss diabetes management plan with primary care provider

## 2023-11-06 ENCOUNTER — Telehealth: Payer: Self-pay

## 2023-11-06 ENCOUNTER — Encounter (HOSPITAL_COMMUNITY): Payer: Self-pay

## 2023-11-06 NOTE — Telephone Encounter (Signed)
Called patient and left message to call office

## 2023-11-06 NOTE — Telephone Encounter (Signed)
Called patient and left message for patient to call office back. 

## 2023-11-06 NOTE — Progress Notes (Signed)
Surgical Instructions   Your procedure is scheduled on Friday, November 14, 2023. Report to [facility] Main Entrance "A" at 10:30 A.M., then check in with the Admitting office. Any questions or running late day of surgery: call [phone]  Questions prior to your surgery date: call [phone], Monday-Friday, 8am-4pm. If you experience any cold or flu symptoms such as cough, fever, chills, shortness of breath, etc. between now and your scheduled surgery, please notify us at the above number.     Remember:  Do not eat or drink after midnight the night before your surgery   Take these medicines the morning of surgery with A SIP OF WATER  icosapent Ethyl (VASCEPA)  ivabradine (CORLANOR)  loratadine (CLARITIN)  metoprolol succinate (TOPROL-XL) pantoprazole (PROTONIX)  raloxifene (EVISTA)   May take these medicines IF NEEDED: HYDROcodone-acetaminophen (NORCO)  nitroGLYCERIN (NITROSTAT).  If you have to take this medication prior to surgery, please call [phone] and report this to a nurse.    Follow your surgeon's instructions on when to stop Asprin.  If no instructions were given by your surgeon then you will need to call the office to get those instructions.     One week prior to surgery, STOP taking any Aleve, Naproxen, Ibuprofen, Motrin, Advil, Goody's, BC's, all herbal medications, fish oil, and non-prescription vitamins.   WHAT DO I DO ABOUT MY DIABETES MEDICATION?   Do not take oral diabetes medicines (pills) the morning of surgery.         DO NOT TAKE YOUR dapagliflozin propanediol (FARXIGA) 72 HOURS PRIOR TO SURGERY, WITH THE LAST DOSE BEING 11/10/2023.    The day of surgery, do not take other diabetes injectables, including Byetta (exenatide), Bydureon (exenatide ER), Victoza (liraglutide), or Trulicity (dulaglutide).  If your CBG is greater than 220 mg/dL, you may take  of your sliding scale (correction) dose of insulin.   HOW TO MANAGE YOUR  DIABETES BEFORE AND AFTER SURGERY  Why is it important to control my blood sugar before and after surgery? Improving blood sugar levels before and after surgery helps healing and can limit problems. A way of improving blood sugar control is eating a healthy diet by:  Eating less sugar and carbohydrates  Increasing activity/exercise  Talking with your doctor about reaching your blood sugar goals High blood sugars (greater than 180 mg/dL) can raise your risk of infections and slow your recovery, so you will need to focus on controlling your diabetes during the weeks before surgery. Make sure that the doctor who takes care of your diabetes knows about your planned surgery including the date and location.  How do I manage my blood sugar before surgery? Check your blood sugar at least 4 times a day, starting 2 days before surgery, to make sure that the level is not too high or low.  Check your blood sugar the morning of your surgery when you wake up and every 2 hours until you get to the Short Stay unit.  If your blood sugar is less than 70 mg/dL, you will need to treat for low blood sugar: Do not take insulin. Treat a low blood sugar (less than 70 mg/dL) with  cup of clear juice (cranberry or apple), 4 glucose tablets, OR glucose gel. Recheck blood sugar in 15 minutes after treatment (to make sure it is greater than 70 mg/dL). If your blood sugar is not greater than 70 mg/dL on recheck, call [phone] for further instructions. Report your blood sugar to the short stay nurse when you  get to Short Stay.  If you are admitted to the hospital after surgery: Your blood sugar will be checked by the staff and you will probably be given insulin after surgery (instead of oral diabetes medicines) to make sure you have good blood sugar levels. The goal for blood sugar control after surgery is 80-180 mg/dL.                      Do NOT Smoke (Tobacco/Vaping) for 24 hours prior to your procedure.  If  you use a CPAP at night, you may bring your mask/headgear for your overnight stay.   You will be asked to remove any contacts, glasses, piercing's, hearing aid's, dentures/partials prior to surgery. Please bring cases for these items if needed.    Patients discharged the day of surgery will not be allowed to drive home, and someone needs to stay with them for 24 hours.  SURGICAL WAITING ROOM VISITATION Patients may have no more than 2 support people in the waiting area - these visitors may rotate.   Pre-op nurse will coordinate an appropriate time for 1 ADULT support person, who may not rotate, to accompany patient in pre-op.  Children under the age of 32 must have an adult with them who is not the patient and must remain in the main waiting area with an adult.  If the patient needs to stay at the hospital during part of their recovery, the visitor guidelines for inpatient rooms apply.  Please refer to the [facility] website for the visitor guidelines for any additional information.   If you received a COVID test during your pre-op visit  it is requested that you wear a mask when out in public, stay away from anyone that may not be feeling well and notify your surgeon if you develop symptoms. If you have been in contact with anyone that has tested positive in the last 10 days please notify you surgeon.      Pre-operative CHG Bathing Instructions   You can play a key role in reducing the risk of infection after surgery. Your skin needs to be as free of germs as possible. You can reduce the number of germs on your skin by washing with CHG (chlorhexidine gluconate) soap before surgery. CHG is an antiseptic soap that kills germs and continues to kill germs even after washing.   DO NOT use if you have an allergy to chlorhexidine/CHG or antibacterial soaps. If your skin becomes reddened or irritated, stop using the CHG and notify one of our RNs at [phone].              TAKE A SHOWER THE  NIGHT BEFORE SURGERY AND THE DAY OF SURGERY    Please keep in mind the following:  DO NOT shave, including legs and underarms, 48 hours prior to surgery.   You may shave your face before/day of surgery.  Place clean sheets on your bed the night before surgery Use a clean washcloth (not used since being washed) for each shower. DO NOT sleep with pet's night before surgery.  CHG Shower Instructions:  Wash your face and private area with normal soap. If you choose to wash your hair, wash first with your normal shampoo.  After you use shampoo/soap, rinse your hair and body thoroughly to remove shampoo/soap residue.  Turn the water OFF and apply half the bottle of CHG soap to a CLEAN washcloth.  Apply CHG soap ONLY FROM YOUR NECK DOWN TO YOUR  TOES (washing for 3-5 minutes)  DO NOT use CHG soap on face, private areas, open wounds, or sores.  Pay special attention to the area where your surgery is being performed.  If you are having back surgery, having someone wash your back for you may be helpful. Wait 2 minutes after CHG soap is applied, then you may rinse off the CHG soap.  Pat dry with a clean towel  Put on clean pajamas    Additional instructions for the day of surgery: DO NOT APPLY any lotions, deodorants or perfumes.   Do not wear jewelry or makeup Do not wear nail polish, gel polish, artificial nails, or any other type of covering on natural nails (fingers and toes) Do not bring valuables to the hospital. [facility] is not responsible for valuables/personal belongings. Put on clean/comfortable clothes.  Please brush your teeth.  Ask your nurse before applying any prescription medications to the skin.

## 2023-11-06 NOTE — Telephone Encounter (Signed)
Received notification from [facility]-pread[facility] nurse that patient is more than a year overdue for an office visit and will need to be seen by EP provider prior, in order to receive device programming recommendations for upcoming Barostim implant on 12/27. Dr. Elberta Fortis office made multiple attempts to reach patient, to offer an appt on 12/26 at 10:30 am or 1:30 pm.   I contacted patient and explained the situation. She stated, she was unaware that she was overdue for an appointment. Patient reports she will be out of town for the holidays with plans to drive home on 16/10. She requested to schedule the later appt. Provided patient the address of [address], [address] and to arrive by 1:15 pm. Emphasized to patient, that if this appointment is not kept, her surgery on 12/27 will need to be postponed. She verbalized understanding.   Dr. Myra Gianotti updated to the situation.

## 2023-11-07 ENCOUNTER — Encounter (HOSPITAL_COMMUNITY): Payer: Self-pay

## 2023-11-07 ENCOUNTER — Encounter (HOSPITAL_COMMUNITY)
Admission: RE | Admit: 2023-11-07 | Discharge: 2023-11-07 | Disposition: A | Discharge status: Home / Self Care | Payer: PPO | Source: Ambulatory Visit | Attending: Surgery | Admitting: Surgery

## 2023-11-07 ENCOUNTER — Other Ambulatory Visit: Payer: Self-pay

## 2023-11-07 VITALS — BP 117/49 | HR 75 | Temp 97.5°F | Resp 17 | Ht 65.0 in | Wt 203.7 lb

## 2023-11-07 DIAGNOSIS — Z01818 Encounter for other preprocedural examination: Secondary | ICD-10-CM

## 2023-11-07 DIAGNOSIS — Z01812 Encounter for preprocedural laboratory examination: Secondary | ICD-10-CM | POA: Insufficient documentation

## 2023-11-07 DIAGNOSIS — Q899 Congenital malformation, unspecified: Secondary | ICD-10-CM | POA: Diagnosis not present

## 2023-11-07 DIAGNOSIS — Z87891 Personal history of nicotine dependence: Secondary | ICD-10-CM | POA: Insufficient documentation

## 2023-11-07 DIAGNOSIS — I5042 Chronic combined systolic (congestive) and diastolic (congestive) heart failure: Secondary | ICD-10-CM | POA: Insufficient documentation

## 2023-11-07 DIAGNOSIS — Z8673 Personal history of transient ischemic attack (TIA), and cerebral infarction without residual deficits: Secondary | ICD-10-CM | POA: Insufficient documentation

## 2023-11-07 DIAGNOSIS — Z955 Presence of coronary angioplasty implant and graft: Secondary | ICD-10-CM | POA: Insufficient documentation

## 2023-11-07 DIAGNOSIS — I509 Heart failure, unspecified: Secondary | ICD-10-CM | POA: Diagnosis not present

## 2023-11-07 DIAGNOSIS — I2510 Atherosclerotic heart disease of native coronary artery without angina pectoris: Secondary | ICD-10-CM | POA: Insufficient documentation

## 2023-11-07 HISTORY — DX: Presence of automatic (implantable) cardiac defibrillator: Z95.810

## 2023-11-07 HISTORY — DX: Arterial fibromuscular dysplasia: I77.3

## 2023-11-07 HISTORY — DX: Type 2 diabetes mellitus without complications: E11.9

## 2023-11-07 HISTORY — DX: Atherosclerotic heart disease of native coronary artery without angina pectoris: I25.10

## 2023-11-07 HISTORY — DX: Palpitations: R00.2

## 2023-11-07 HISTORY — DX: Unspecified osteoarthritis, unspecified site: M19.90

## 2023-11-07 HISTORY — DX: Other specified postprocedural states: R11.2

## 2023-11-07 HISTORY — DX: Other specified postprocedural states: Z98.890

## 2023-11-07 LAB — URINALYSIS, ROUTINE W REFLEX MICROSCOPIC
Bilirubin Urine: NEGATIVE
Glucose, UA: >=500 mg/dL — AB
Hgb urine dipstick: NEGATIVE
Ketones, ur: NEGATIVE mg/dL
Leukocytes,Ua: NEGATIVE
Nitrite: NEGATIVE
Protein, ur: NEGATIVE mg/dL
Specific Gravity, Urine: 1.031 — ABNORMAL HIGH (ref 1.005–1.030)
pH: 5 (ref 5.0–8.0)

## 2023-11-07 LAB — COMPREHENSIVE METABOLIC PANEL
ALT: 19 U/L (ref 0–44)
AST: 19 U/L (ref 15–41)
Albumin: 3.8 g/dL (ref 3.5–5.0)
Alkaline Phosphatase: 70 U/L (ref 38–126)
Anion gap: 10 (ref 5–15)
BUN: 12 mg/dL (ref 8–23)
CO2: 29 mmol/L (ref 22–32)
Calcium: 10 mg/dL (ref 8.9–10.3)
Chloride: 97 mmol/L — ABNORMAL LOW (ref 98–111)
Creatinine, Ser: 0.9 mg/dL (ref 0.44–1.00)
GFR, Estimated: >60 mL/min (ref >60)
Glucose, Bld: 113 mg/dL — ABNORMAL HIGH (ref 70–99)
Potassium: 2.9 mmol/L — ABNORMAL LOW (ref 3.5–5.1)
Sodium: 136 mmol/L (ref 135–145)
Total Bilirubin: 0.9 mg/dL (ref <1.2)
Total Protein: 7.1 g/dL (ref 6.5–8.1)

## 2023-11-07 LAB — APTT: aPTT: 26 s (ref 24–36)

## 2023-11-07 LAB — GLUCOSE, CAPILLARY: Glucose-Capillary: 106 mg/dL — ABNORMAL HIGH (ref 70–99)

## 2023-11-07 LAB — CBC
HCT: 46.8 % — ABNORMAL HIGH (ref 36.0–46.0)
Hemoglobin: 16.5 g/dL — ABNORMAL HIGH (ref 12.0–15.0)
MCH: 31.6 pg (ref 26.0–34.0)
MCHC: 35.3 g/dL (ref 30.0–36.0)
MCV: 89.7 fL (ref 80.0–100.0)
Platelets: 304 10*3/uL (ref 150–400)
RBC: 5.22 MIL/uL — ABNORMAL HIGH (ref 3.87–5.11)
RDW: 12.8 % (ref 11.5–15.5)
WBC: 8.1 10*3/uL (ref 4.0–10.5)
nRBC: 0 % (ref 0.0–0.2)

## 2023-11-07 LAB — SURGICAL PCR SCREEN
MRSA, PCR: NEGATIVE
Staphylococcus aureus: NEGATIVE

## 2023-11-07 LAB — PROTIME-INR
INR: 1.1 (ref 0.8–1.2)
Prothrombin Time: 13.9 s (ref 11.4–15.2)

## 2023-11-07 LAB — TYPE AND SCREEN
ABO/RH(D): B POS
Antibody Screen: NEGATIVE

## 2023-11-07 NOTE — Progress Notes (Signed)
PCP - Dr. Blane Ohara Cardiologist - Dr. [name]  PPM/ICD - Biotronik dual PPM/AICD Device Orders - requested, pt needs appt for device check, scheduled for 12/26 Rep Notified - awaiting orders  Chest x-ray - 07/18/17 EKG - 07/08/23 Stress Test - 07/13/20 ECHO - 01/09/23 Cardiac Cath - 04/07/20  Sleep Study - denies   DM- pt does not check CBG at home and does not know typical fasting levels  Last dose of GLP1 agonist-  n/a   Blood Thinner Instructions: n/a Aspirin Instructions: continue  ERAS Protcol - no, NPO   COVID TEST- n/a   Anesthesia review: yes, pt has Biotronik dual PPM/AICD. Device check scheduled 12/26, needs this to receive orders for surgery  Patient denies shortness of breath, fever, cough and chest pain at PAT appointment   All instructions explained to the patient, with a verbal understanding of the material. Patient agrees to go over the instructions while at home for a better understanding.  The opportunity to ask questions was provided.

## 2023-11-10 ENCOUNTER — Encounter (HOSPITAL_COMMUNITY): Payer: Self-pay

## 2023-11-10 ENCOUNTER — Telehealth: Payer: Self-pay

## 2023-11-10 NOTE — Telephone Encounter (Signed)
-----   Message from Durene Cal sent at 11/07/2023  5:03 PM EST ----- Regarding: RE: Abnormal labs fine ----- Message ----- From: Primitivo Gauze, RN Sent: 11/07/2023   4:10 PM EST To: Nada Libman, MD Subject: Abnormal labs                                  WB   Patient is scheduled for Barostim implant on 12/27. Her K+ 2.9 on preop labs  Thanks,  Rumsey

## 2023-11-10 NOTE — Progress Notes (Unsigned)
Cardiology Office Note:  .   Date:  11/10/2023  ID:  [name], DOB August 16, 1961, MRN [id number] PCP: Blane Ohara, MD  [facility] Providers Cardiologist:  None Electrophysiologist:  Will Jorja Loa, MD  Advanced Heart Failure:  [name], MD {  History of Present Illness: .   Allison White is a 62 y.o. female w/PMHx of CAD (STEMI 11/19/2016 that is post drug-eluting stent to the LAD and diagonal bifurcation), ICM, chronic CHF, ICD, breast ca (lumpectomy x2 and radiation of her left breast), HLD, HTN, PVD (carotid artery dissection with stenting of left carotid due to possible FMD)  Saw Dr. Elberta Fortis 07/08/22, denied symptoms, stable device findings, no changes were made.  Seeing HF team regularly, last Dr. Gala Romney, 07/08/23, more fatigued, less exertional capacity of late, worse in the heat.  Planned to refer for Barostim  Saw Dr. Myra Gianotti 08/18/23, very frustrated with her level of energy, felt to be an excellent candidate, planned to pursue once insurance approved.  Scheduled for 11/14/23  Today's visit is scheduled as a device check/overdue, prior to her procedure  ROS:   She is doing ok Hopeful that the barostim will help her exertional capacity No CP,  some brief and rare palpitations No rest SOB, minimal stamina with DOE No near syncope or syncope.  Device information Biotronik dual chamber ICD implanted 02/25/2017  Arrhythmia/AAD hx None to date  Studies Reviewed: [name]    EKG done today and reviewed by myself AP/VS 72bpm  DEVICE interrogation done today and reviewed by myself Battery and lead measurements are good No arrhythmias AP 72% VP 2%  Echo 01/09/23: EF 20-25% RV ok G2 DD mod MR   TTE 03/17/18 - Left ventricle: The cavity size was normal. Systolic function was   severely reduced. The estimated ejection fraction was in the   range of 25% to 30%. Diffuse hypokinesis. The study is not   technically sufficient to allow evaluation of LV  diastolic   function. - Aortic valve: Transvalvular velocity was within the normal range.   There was no stenosis. There was no regurgitation. - Mitral valve: Transvalvular velocity was within the normal range.   There was no evidence for stenosis. There was trivial   regurgitation. - Right ventricle: The cavity size was normal. Wall thickness was   normal. Systolic function was normal. - Atrial septum: No defect or patent foramen ovale was identified   by color flow Doppler. - Tricuspid valve: There was mild regurgitation. - Pulmonary arteries: Systolic pressure was within the normal   range. PA peak pressure: 26 mm Hg (S).   Risk Assessment/Calculations:    Physical Exam:   VS:  There were no vitals taken for this visit.   Wt Readings from Last 3 Encounters:  11/07/23 203 lb 11.2 oz (92.4 kg)  11/04/23 202 lb 9.6 oz (91.9 kg)  10/27/23 206 lb (93.4 kg)    GEN: Well nourished, well developed in no acute distress NECK: No JVD; No carotid bruits CARDIAC: RRR, no murmurs, rubs, gallops RESPIRATORY:  CTA b/l without rales, wheezing or rhonchi  ABDOMEN: Soft, non-tender, non-distended EXTREMITIES:  No edema; No deformity   ICD site:  is stable, no thinning, fluctuation, tethering  ASSESSMENT AND PLAN: .    ICD intact function no programming changes made  Biotronik rep will need to be present for Barostim implant procedure They are aware/I spoke with rep personally   CAD No anginal symptoms On ASA, BB, repatha, reports intol to statins C/w  Dr. Lynne Logan  ICM Chronic CHF She has trace edema, just drove back from the beach today, did not take her lasix, though does not appear overtly volume OL otherwise On BB, dig, farxiga ivabradine, spiro, torsemide C/w Dr. Lynne Logan   Dispo: remotes as usual, in clinic with EP again in a year, sooner if needed  Signed, Sheilah Pigeon, PA-C

## 2023-11-10 NOTE — Progress Notes (Signed)
Anesthesia Chart Review:  Case: [id number] Date/Time: 11/14/23 1215   Procedure: INSERTION OF Allison White (Right)   Anesthesia type: Choice   Pre-op diagnosis: Congestive heart failure   [facility] / [facility]   Surgeons: Nada Libman, MD       DISCUSSION: Patient is a 62 year old female scheduled for the above procedure. She was diagnosed with CHF following anterolateral STEMI 11/19/16. She is followed by Dr. Gala Romney, last visit 07/08/23. Relatively stable NYHA II-III then on torsemide, losartan, spironolactone, Toprol, digoxin, Farxiga, ivabradine. Off Entresto due to hypotension. She has persistent fatigue. He referred her VVS for Barostim evaluation.   History includes former smoker, post-operative N/V, CAD (anterolateral STEMI 11/19/16, s/p DES LAD, EF 50%->EF 30-35% 12/01/16, last LVEF 20-25% 01/09/23 echo), ischemic cardiomyopathy, HFrEF (2018), ICD (dual chamber Biotronik 02/25/17 by Dr. Sandy Salaam), meningitis (~ 2005), CVA (06/10/08, 7/25/209 cerebral angiogram showed large left ICA pseudoaneurysm with imaging suggestive of fibromuscular dysplasia with dissection, possible old healed right ICA dissection; s/p left ICA stent for pseudoaneurysm 07/14/08), TAH (08/16/04), right breast cancer (/sp right breast lumpectomy 07/2021, radiation). Notes suggest the FMD is referring to history of fibromuscular dysplasia and not fascioscapulohumeral muscular dystrophy.   She is overdue to Biotronik ICD in-person follow-up. She has an appointment with Francis Dowse, PA-C on 11/13/23 to re-evaluate.   EP Perioperative ICD Recommendations pending. Will leave chart for follow-up.   ADDENDUM 11/13/23 3:52 PM: She had EP follow-up this afternoon. Note is still pended. EP perioperative device recommendations provided:  Device Information:  Clinic EP Physician:  Loman Brooklyn, MD  Device Type:  Defibrillator Manufacturer and Phone #:  Biotronik: [phone] Pacemaker Dependent?:  Unknown Date of Last  Device Check:  09/11/23         Normal Device Function?:  Yes.     Electrophysiologist's Recommendations: Have magnet available. Provide continuous ECG monitoring when magnet is used or reprogramming is to be performed.  Procedure will likely interfere with device function.  Device should be programmed:  Tachy therapies disabled  ISTAT 8 ordered for day of surgery to follow-up hypokalemia. Anesthesia team to evaluate on the day of surgery.    VS: BP (!) 117/49   Pulse 75   Temp (!) 36.4 C   Resp 17   Ht 5\' 5"  (1.651 m)   Wt 92.4 kg   SpO2 95%   BMI 33.90 kg/m    PROVIDERS: CoxFritzi Mandes, MD is PCP  [name], MD is HF cardiologist Elberta Fortis, Will, MD is EP cardiologist   LABS: Preoperative labs noted. K 2.9, VVS notified. Will get an iSTAT on arrival to revaluate hypokalemia, if not repeated prior to day of procedure. A1c 6.9% on 10/26/23. NT-proBNP 484 on 09/16/23.  (all labs ordered are listed, but only abnormal results are displayed)  Labs Reviewed  GLUCOSE, CAPILLARY - Abnormal; Notable for the following components:      Result Value   Glucose-Capillary 106 (*)    All other components within normal limits  CBC - Abnormal; Notable for the following components:   RBC 5.22 (*)    Hemoglobin 16.5 (*)    HCT 46.8 (*)    All other components within normal limits  COMPREHENSIVE METABOLIC PANEL - Abnormal; Notable for the following components:   Potassium 2.9 (*)    Chloride 97 (*)    Glucose, Bld 113 (*)    All other components within normal limits  URINALYSIS, ROUTINE W REFLEX MICROSCOPIC - Abnormal; Notable for the following  components:   APPearance HAZY (*)    Specific Gravity, Urine 1.031 (*)    Glucose, UA >=500 (*)    Bacteria, UA RARE (*)    All other components within normal limits  SURGICAL PCR SCREEN  PROTIME-INR  APTT  TYPE AND SCREEN    EKG: 07/08/23: Normal sinus rhythm Left anterior fascicular block Left ventricular hypertrophy with QRS  widening ( R in aVL , Cornell product ) Cannot rule out Septal infarct (cited on or before 16-Oct-2020) Abnormal ECG When compared with ECG of 08-May-2021 11:15, Sinus rhythm has replaced Electronic atrial pacemaker Nonspecific T wave abnormality no longer evident in Inferior leads Confirmed by Marca Ancona [id number]) on 07/08/2023 3:56:12 PM   CV: US Carotid 08/18/23: Summary:  - Right Carotid: There is no evidence of stenosis in the right ICA.  - Left Carotid: There is no evidence of stenosis in the left ICA.  - Vertebrals:  Bilateral vertebral arteries demonstrate antegrade flow.  - Subclavians: Normal flow hemodynamics were seen in bilateral subclavian arteries.    Echo 01/09/23: IMPRESSIONS   1. Left ventricular ejection fraction, by estimation, is 20 to 25%. The  left ventricle has severely decreased function. The left ventricle  demonstrates global hypokinesis. The left ventricular internal cavity size  was moderately to severely dilated.  Left ventricular diastolic parameters are consistent with Grade II  diastolic dysfunction (pseudonormalization).   2. Right ventricular systolic function is normal. The right ventricular  size is normal. There is normal pulmonary artery systolic pressure.   3. Left atrial size was moderately dilated.   4. The mitral valve is normal in structure. Moderate mitral valve  regurgitation. No evidence of mitral stenosis.   5. The aortic valve is normal in structure. Aortic valve regurgitation is  not visualized. No aortic stenosis is present.   6. The inferior vena cava is normal in size with greater than 50%  respiratory variability, suggesting right atrial pressure of 3 mmHg.  - Comparison: LVEF 25-30% with anteroseptal, apical and distal inferior akinesis, grade 1 DD, mild MR  09/27/21; 25% with global LV hypokinesis 10/30/20; EF 20-25% 07/13/19, 25-30% 03/17/18 & 07/02/17     CPX 07/13/20: FVC 3.08 (91%)      FEV1 2.30 (87%%)        FEV1/FVC  75 (94%)        MVV 89 (94%)  RER 1.10 Peak VO2: 15.3 (90% predicted peak VO2) - adjusted to iBW 25.45ml/kg/min VE/VCO2 slope:  33  Attending: Low normal functional capacity. Suspect main component of exercise intolerance is related to her obesity,. The mildly elevated VE/VCO2 slope may be related to a mild HF limitation.    Cardiac cath 04/07/20: Previously placed Ost LAD to Prox LAD stent (unknown type) is widely patent.   Findings: Ao = 102/56 (75) LV = 104/13 RA = 3 RV = 27/4 PA = 28/9 (17) PCW = 7 Fick cardiac output/index = 7.2/3.4 PVR = 1.4 WU FA sat = 99% PA sat = 79%, 81% High SVC sat = 81%   Assessment: 1. Normal coronary arteries with widely patent LAD stent 2. Normal hemodynamics 3. EF 20-25%   Plan/Discussion: Medical therapy. Plan CPX testing to further assess.     Long term monitor 08/05/18 - 08/18/18: 1. Sinus rhythm 2. Rare PVCs and bigeminy (< 1.0%) 3. Two patient-triggered events associated with isolated PVCs.  4. No high-grade arrhythmias   Past Medical History:  Diagnosis Date   AICD (automatic cardioverter/defibrillator) present 02/25/2017  Biotronik- Dual PPM/AICD   Arthritis    Cancer (HCC) 2022   Rt Breast   CHF (congestive heart failure) (HCC)    Coronary artery disease    Diabetes mellitus without complication (HCC)    Fibromuscular dysplasia (HCC)    FMD (facioscapulohumeral muscular dystrophy) (HCC)    Headache(784.0)    Heart failure (HCC)    Hyperlipidemia    MI (myocardial infarction) (HCC) 11/19/2016   s/p DES LAD   Migraine    Nonruptured cerebral aneurysm, internal carotid artery 07/14/2008   Left ICA stent placed for pseudoaneurysm, likely from dissection related to fibromuscular dysplasia009)   Palpitations    PONV (postoperative nausea and vomiting)    Pseudoaneurysm (HCC)    both carotids    Stroke (HCC) 06/10/2008   when carotid artery dissected, she was told she had a stroke (pt states she has had dissection on both  sides); s/p left ICA stent 07/14/08   Vertigo     Past Surgical History:  Procedure Laterality Date   ABDOMINAL HYSTERECTOMY  08/16/2004   Uterus and Cervix (per pathology)   BREAST LUMPECTOMY Right 07/2021   CARDIAC DEFIBRILLATOR PLACEMENT     CEREBRAL ANEURYSM REPAIR Left 2009   CESAREAN SECTION     CORONARY ANGIOPLASTY WITH STENT PLACEMENT     RIGHT/LEFT HEART CATH AND CORONARY ANGIOGRAPHY N/A 04/07/2020   Procedure: RIGHT/LEFT HEART CATH AND CORONARY ANGIOGRAPHY;  Surgeon: Dolores Patty, MD;  [facility];  Service: Cardiovascular;  Laterality: N/A;   TUBAL LIGATION      MEDICATIONS:  aspirin 81 MG EC tablet   dapagliflozin propanediol (FARXIGA) 10 MG TABS tablet   digoxin (LANOXIN) 0.125 MG tablet   Evolocumab (REPATHA SURECLICK) 140 MG/ML SOAJ   HYDROcodone-acetaminophen (NORCO) 7.5-325 MG tablet   ibuprofen (ADVIL) 200 MG tablet   icosapent Ethyl (VASCEPA) 1 g capsule   ivabradine (CORLANOR) 7.5 MG TABS tablet   loratadine (CLARITIN) 10 MG tablet   metoprolol succinate (TOPROL-XL) 50 MG 24 hr tablet   nitroGLYCERIN (NITROSTAT) 0.4 MG SL tablet   pantoprazole (PROTONIX) 40 MG tablet   Phenazopyridine HCl (AZO TABS PO)   raloxifene (EVISTA) 60 MG tablet   spironolactone (ALDACTONE) 25 MG tablet   torsemide (DEMADEX) 100 MG tablet   No current facility-administered medications for this encounter.    Shonna Chock, PA-C Surgical Short Stay/Anesthesiology [facility] Phone [phone] [facility] Phone [phone] 11/10/2023 2:12 PM

## 2023-11-13 ENCOUNTER — Encounter: Payer: Self-pay | Admitting: Cardiology

## 2023-11-13 ENCOUNTER — Ambulatory Visit: Payer: PPO | Attending: Physician Assistant | Admitting: Physician Assistant

## 2023-11-13 ENCOUNTER — Encounter: Payer: Self-pay | Admitting: Physician Assistant

## 2023-11-13 VITALS — BP 128/76 | HR 72 | Ht 65.0 in | Wt 210.6 lb

## 2023-11-13 DIAGNOSIS — I2510 Atherosclerotic heart disease of native coronary artery without angina pectoris: Secondary | ICD-10-CM

## 2023-11-13 DIAGNOSIS — Z95810 Presence of automatic (implantable) cardiac defibrillator: Secondary | ICD-10-CM | POA: Diagnosis not present

## 2023-11-13 DIAGNOSIS — I255 Ischemic cardiomyopathy: Secondary | ICD-10-CM

## 2023-11-13 DIAGNOSIS — I5022 Chronic systolic (congestive) heart failure: Secondary | ICD-10-CM | POA: Diagnosis not present

## 2023-11-13 LAB — CUP PACEART INCLINIC DEVICE CHECK
Date Time Interrogation Session: 20241226180614
Implantable Lead Connection Status: 753985
Implantable Lead Connection Status: 753985
Implantable Lead Implant Date: 20180410
Implantable Lead Implant Date: 20180410
Implantable Lead Location: 753859
Implantable Lead Location: 753860
Implantable Lead Model: 377
Implantable Lead Model: 402266
Implantable Lead Serial Number: 49794726
Implantable Lead Serial Number: 49838890
Implantable Pulse Generator Implant Date: 20180410
Lead Channel Pacing Threshold Amplitude: 0.5 V
Lead Channel Pacing Threshold Amplitude: 0.7 V
Lead Channel Pacing Threshold Pulse Width: 0.4 ms
Lead Channel Pacing Threshold Pulse Width: 0.4 ms
Lead Channel Sensing Intrinsic Amplitude: 17 mV
Lead Channel Sensing Intrinsic Amplitude: 2.1 mV
Pulse Gen Model: 404622
Pulse Gen Serial Number: 60982098

## 2023-11-13 NOTE — Progress Notes (Signed)
PERIOPERATIVE PRESCRIPTION FOR IMPLANTED CARDIAC DEVICE PROGRAMMING  Patient Information: Name:  Allison White  DOB:  1961-09-02  MRN:  [id number]    Planned Procedure:  Insertion of right barostim implant  Surgeon:  Dr. Nada Libman  Location: [facility]  Date of Procedure:  11/14/2023  Cautery will be used.  Position during surgery:  Supine   Please send documentation back to:  [facility] (Fax # [phone])   Device Information:  Clinic EP Physician:  Loman Brooklyn, MD   Device Type:  Defibrillator Manufacturer and Phone #:  Biotronik: [phone] Pacemaker Dependent?:  Unknown Date of Last Device Check:  09/11/23 Normal Device Function?:  Yes.    Electrophysiologist's Recommendations:  Have magnet available. Provide continuous ECG monitoring when magnet is used or reprogramming is to be performed.  Procedure will likely interfere with device function.  Device should be programmed:  Tachy therapies disabled  Per Device Clinic Standing Orders, Lenor Coffin, RN  3:42 PM 11/13/2023

## 2023-11-13 NOTE — Patient Instructions (Signed)
Medication Instructions:   Your physician recommends that you continue on your current medications as directed. Please refer to the Current Medication list given to you today.  *If you need a refill on your cardiac medications before your next appointment, please call your pharmacy*   Lab Work:  NONE ORDERED  TODAY   If you have labs (blood work) drawn today and your tests are completely normal, you will receive your results only by: MyChart Message (if you have MyChart) OR A paper copy in the mail If you have any lab test that is abnormal or we need to change your treatment, we will call you to review the results.   Testing/Procedures:   NONE ORDERED  TODAY     Follow-Up: At [facility], you and your health needs are our priority.  As part of our continuing mission to provide you with exceptional heart care, we have created designated Provider Care Teams.  These Care Teams include your primary Cardiologist (physician) and Advanced Practice Providers (APPs -  Physician Assistants and Nurse Practitioners) who all work together to provide you with the care you need, when you need it.  We recommend signing up for the patient portal called "MyChart".  Sign up information is provided on this After Visit Summary.  MyChart is used to connect with patients for Virtual Visits (Telemedicine).  Patients are able to view lab/test results, encounter notes, upcoming appointments, etc.  Non-urgent messages can be sent to your provider as well.   To learn more about what you can do with MyChart, go to [email or web address].    Your next appointment:  AS SCHEDULED   Provider:   Casimiro Needle "Otilio Saber, [place]    Other Instructions

## 2023-11-13 NOTE — Anesthesia Preprocedure Evaluation (Addendum)
Anesthesia Evaluation    Airway Mallampati: III  TM Distance: <3 FB Neck ROM: Full    Dental no notable dental hx.    Pulmonary former smoker   Pulmonary exam normal breath sounds clear to auscultation       Cardiovascular hypertension, + CAD, + Past MI and +CHF  Normal cardiovascular exam+ pacemaker + Cardiac Defibrillator  Rhythm:Regular Rate:Normal  istory includes former smoker, post-operative N/V, CAD (anterolateral STEMI 11/19/16, s/p DES LAD, EF 50%->EF 30-35% 12/01/16, last LVEF 20-25% 01/09/23 echo), ischemic cardiomyopathy,    Neuro/Psych CVA, No Residual Symptoms    GI/Hepatic   Endo/Other  diabetes, Type 2    Renal/GU      Musculoskeletal   Abdominal   Peds  Hematology   Anesthesia Other Findings   Reproductive/Obstetrics                             Anesthesia Physical Anesthesia Plan  ASA: 4  Anesthesia Plan: General   Post-op Pain Management: Minimal or no pain anticipated   Induction: Intravenous  PONV Risk Score and Plan: 3 and Ondansetron, Dexamethasone and Treatment may vary due to age or medical condition  Airway Management Planned: Oral ETT  Additional Equipment:   Intra-op Plan:   Post-operative Plan: Extubation in OR  Informed Consent: I have reviewed the patients History and Physical, chart, labs and discussed the procedure including the risks, benefits and alternatives for the proposed anesthesia with the patient or authorized representative who has indicated his/her understanding and acceptance.     Dental advisory given  Plan Discussed with: CRNA and Surgeon  Anesthesia Plan Comments: (PAT note written by Shonna Chock, PA-C. For iSTAT on arrival to re-check potassium.  )       Anesthesia Quick Evaluation

## 2023-11-14 ENCOUNTER — Encounter (HOSPITAL_COMMUNITY)
Admission: RE | Disposition: A | Discharge status: Home / Self Care | Payer: Self-pay | Source: Home / Self Care | Attending: Surgery

## 2023-11-14 ENCOUNTER — Ambulatory Visit (HOSPITAL_COMMUNITY)
Admission: RE | Admit: 2023-11-14 | Discharge: 2023-11-14 | Disposition: A | Discharge status: Home / Self Care | Payer: PPO | Attending: Surgery | Admitting: Surgery

## 2023-11-14 ENCOUNTER — Other Ambulatory Visit: Payer: Self-pay

## 2023-11-14 ENCOUNTER — Ambulatory Visit (HOSPITAL_BASED_OUTPATIENT_CLINIC_OR_DEPARTMENT_OTHER): Payer: PPO | Admitting: Vascular Surgery

## 2023-11-14 ENCOUNTER — Ambulatory Visit (HOSPITAL_COMMUNITY): Payer: PPO | Admitting: Vascular Surgery

## 2023-11-14 DIAGNOSIS — Z5986 Financial insecurity: Secondary | ICD-10-CM | POA: Diagnosis not present

## 2023-11-14 DIAGNOSIS — I110 Hypertensive heart disease with heart failure: Secondary | ICD-10-CM | POA: Diagnosis not present

## 2023-11-14 DIAGNOSIS — Z7984 Long term (current) use of oral hypoglycemic drugs: Secondary | ICD-10-CM | POA: Diagnosis not present

## 2023-11-14 DIAGNOSIS — I252 Old myocardial infarction: Secondary | ICD-10-CM | POA: Diagnosis not present

## 2023-11-14 DIAGNOSIS — E119 Type 2 diabetes mellitus without complications: Secondary | ICD-10-CM | POA: Diagnosis not present

## 2023-11-14 DIAGNOSIS — I5022 Chronic systolic (congestive) heart failure: Secondary | ICD-10-CM | POA: Diagnosis not present

## 2023-11-14 DIAGNOSIS — I509 Heart failure, unspecified: Secondary | ICD-10-CM | POA: Diagnosis not present

## 2023-11-14 DIAGNOSIS — E876 Hypokalemia: Secondary | ICD-10-CM

## 2023-11-14 DIAGNOSIS — Z87891 Personal history of nicotine dependence: Secondary | ICD-10-CM | POA: Diagnosis not present

## 2023-11-14 DIAGNOSIS — I2510 Atherosclerotic heart disease of native coronary artery without angina pectoris: Secondary | ICD-10-CM | POA: Insufficient documentation

## 2023-11-14 DIAGNOSIS — Z8249 Family history of ischemic heart disease and other diseases of the circulatory system: Secondary | ICD-10-CM | POA: Insufficient documentation

## 2023-11-14 DIAGNOSIS — E785 Hyperlipidemia, unspecified: Secondary | ICD-10-CM | POA: Diagnosis not present

## 2023-11-14 HISTORY — PX: BAROREFLEX SYSTEM INSERTION: EP1254

## 2023-11-14 LAB — POCT I-STAT, CHEM 8
BUN: 10 mg/dL (ref 8–23)
Calcium, Ion: 1.17 mmol/L (ref 1.15–1.40)
Chloride: 102 mmol/L (ref 98–111)
Creatinine, Ser: 0.8 mg/dL (ref 0.44–1.00)
Glucose, Bld: 121 mg/dL — ABNORMAL HIGH (ref 70–99)
HCT: 46 % (ref 36.0–46.0)
Hemoglobin: 15.6 g/dL — ABNORMAL HIGH (ref 12.0–15.0)
Potassium: 3.9 mmol/L (ref 3.5–5.1)
Sodium: 141 mmol/L (ref 135–145)
TCO2: 27 mmol/L (ref 22–32)

## 2023-11-14 LAB — GLUCOSE, CAPILLARY
Glucose-Capillary: 111 mg/dL — ABNORMAL HIGH (ref 70–99)
Glucose-Capillary: 123 mg/dL — ABNORMAL HIGH (ref 70–99)

## 2023-11-14 SURGERY — INSERTION, CAROTID SINUS BAROREFLEX ACTIVATION DEVICE
Anesthesia: General | Site: Chest | Laterality: Right

## 2023-11-14 MED ORDER — CHLORHEXIDINE GLUCONATE CLOTH 2 % EX PADS: 6.0000 | MEDICATED_PAD | Freq: Once | CUTANEOUS | Status: DC

## 2023-11-14 MED ORDER — FENTANYL CITRATE (PF) 250 MCG/5ML IJ SOLN
INTRAMUSCULAR | Status: DC | PRN
  Administered 2023-11-14 (×2): 50 ug via INTRAVENOUS
  Administered 2023-11-14: 100 ug via INTRAVENOUS
  Administered 2023-11-14: 50 ug via INTRAVENOUS

## 2023-11-14 MED ORDER — CHLORHEXIDINE GLUCONATE 0.12 % MT SOLN
15.0000 mL | Freq: Once | OROMUCOSAL | Status: AC
  Administered 2023-11-14: 15 mL via OROMUCOSAL
  Filled 2023-11-14: qty 15

## 2023-11-14 MED ORDER — ONDANSETRON HCL 4 MG/2ML IJ SOLN
INTRAMUSCULAR | Status: AC
  Filled 2023-11-14: qty 2

## 2023-11-14 MED ORDER — FENTANYL CITRATE (PF) 100 MCG/2ML IJ SOLN
25.0000 ug | INTRAMUSCULAR | Status: DC | PRN
  Administered 2023-11-14 (×2): 25 ug via INTRAVENOUS

## 2023-11-14 MED ORDER — SODIUM CHLORIDE 0.9 % IV SOLN: INTRAVENOUS | Status: DC

## 2023-11-14 MED ORDER — ORAL CARE MOUTH RINSE: 15.0000 mL | Freq: Once | OROMUCOSAL | Status: AC

## 2023-11-14 MED ORDER — CEFAZOLIN SODIUM-DEXTROSE 2-4 GM/100ML-% IV SOLN
2.0000 g | INTRAVENOUS | Status: AC
  Administered 2023-11-14: 2 g via INTRAVENOUS
  Filled 2023-11-14: qty 100

## 2023-11-14 MED ORDER — DEXAMETHASONE SODIUM PHOSPHATE 10 MG/ML IJ SOLN
INTRAMUSCULAR | Status: AC
  Filled 2023-11-14: qty 1

## 2023-11-14 MED ORDER — FENTANYL CITRATE (PF) 250 MCG/5ML IJ SOLN
INTRAMUSCULAR | Status: AC
  Filled 2023-11-14: qty 5

## 2023-11-14 MED ORDER — OXYCODONE HCL 5 MG/5ML PO SOLN: 5.0000 mg | Freq: Once | ORAL | Status: DC | PRN

## 2023-11-14 MED ORDER — ETOMIDATE 2 MG/ML IV SOLN
INTRAVENOUS | Status: DC | PRN
  Administered 2023-11-14: 18 mg via INTRAVENOUS

## 2023-11-14 MED ORDER — OXYCODONE HCL 5 MG PO TABS: 5.0000 mg | ORAL_TABLET | Freq: Once | ORAL | Status: DC | PRN

## 2023-11-14 MED ORDER — 0.9 % SODIUM CHLORIDE (POUR BTL) OPTIME
TOPICAL | Status: DC | PRN
  Administered 2023-11-14: 1000 mL

## 2023-11-14 MED ORDER — ROCURONIUM BROMIDE 10 MG/ML (PF) SYRINGE
PREFILLED_SYRINGE | INTRAVENOUS | Status: DC | PRN
  Administered 2023-11-14: 60 mg via INTRAVENOUS
  Administered 2023-11-14: 20 mg via INTRAVENOUS

## 2023-11-14 MED ORDER — PHENYLEPHRINE HCL-NACL 20-0.9 MG/250ML-% IV SOLN
INTRAVENOUS | Status: DC | PRN
  Administered 2023-11-14: 20 ug/min via INTRAVENOUS

## 2023-11-14 MED ORDER — FENTANYL CITRATE (PF) 100 MCG/2ML IJ SOLN
INTRAMUSCULAR | Status: AC
  Filled 2023-11-14: qty 2

## 2023-11-14 MED ORDER — LIDOCAINE 2% (20 MG/ML) 5 ML SYRINGE
INTRAMUSCULAR | Status: AC
  Filled 2023-11-14: qty 5

## 2023-11-14 MED ORDER — DEXAMETHASONE SODIUM PHOSPHATE 10 MG/ML IJ SOLN
INTRAMUSCULAR | Status: DC | PRN
  Administered 2023-11-14: 4 mg via INTRAVENOUS

## 2023-11-14 MED ORDER — SUGAMMADEX SODIUM 200 MG/2ML IV SOLN
INTRAVENOUS | Status: DC | PRN
  Administered 2023-11-14: 200 mg via INTRAVENOUS

## 2023-11-14 MED ORDER — PROPOFOL 500 MG/50ML IV EMUL
INTRAVENOUS | Status: DC | PRN
  Administered 2023-11-14: 50 ug/kg/min via INTRAVENOUS

## 2023-11-14 MED ORDER — ONDANSETRON HCL 4 MG/2ML IJ SOLN: 4.0000 mg | Freq: Once | INTRAMUSCULAR | Status: DC | PRN

## 2023-11-14 MED ORDER — MIDAZOLAM HCL (PF) 10 MG/2ML IJ SOLN
INTRAMUSCULAR | Status: AC
  Filled 2023-11-14: qty 2

## 2023-11-14 MED ORDER — SCOPOLAMINE 1 MG/3DAYS TD PT72
1.0000 | MEDICATED_PATCH | TRANSDERMAL | Status: DC
  Filled 2023-11-14: qty 1

## 2023-11-14 MED ORDER — SODIUM CHLORIDE 0.9 % IV SOLN: INTRAVENOUS | Status: DC | PRN

## 2023-11-14 MED ORDER — MIDAZOLAM HCL (PF) 5 MG/ML IJ SOLN
INTRAMUSCULAR | Status: DC | PRN
  Administered 2023-11-14: 2 mg via INTRAVENOUS

## 2023-11-14 MED ORDER — ROCURONIUM BROMIDE 10 MG/ML (PF) SYRINGE
PREFILLED_SYRINGE | INTRAVENOUS | Status: AC
  Filled 2023-11-14: qty 10

## 2023-11-14 MED ORDER — ONDANSETRON HCL 4 MG/2ML IJ SOLN
INTRAMUSCULAR | Status: DC | PRN
  Administered 2023-11-14: 4 mg via INTRAVENOUS

## 2023-11-14 MED ORDER — PROPOFOL 10 MG/ML IV BOLUS
INTRAVENOUS | Status: AC
  Filled 2023-11-14: qty 20

## 2023-11-14 MED ORDER — LACTATED RINGERS IV SOLN: INTRAVENOUS | Status: DC | PRN

## 2023-11-14 MED ORDER — ETOMIDATE 2 MG/ML IV SOLN
INTRAVENOUS | Status: AC
  Filled 2023-11-14: qty 10

## 2023-11-14 SURGICAL SUPPLY — 30 items
BAG COUNTER SPONGE SURGICOUNT (BAG) ×1 IMPLANT
CANISTER SUCT 3000ML PPV (MISCELLANEOUS) ×1 IMPLANT
CHLORAPREP W/TINT 26 (MISCELLANEOUS) ×1 IMPLANT
CLIP TI MEDIUM 6 (CLIP) IMPLANT
CLIP TI WIDE RED SMALL 6 (CLIP) IMPLANT
COVER PROBE W GEL 5X96 (DRAPES) ×1 IMPLANT
DERMABOND ADVANCED .7 DNX12 (GAUZE/BANDAGES/DRESSINGS) ×1 IMPLANT
ELECT REM PT RETURN 9FT ADLT (ELECTROSURGICAL) ×1
ELECTRODE REM PT RTRN 9FT ADLT (ELECTROSURGICAL) ×1 IMPLANT
GENERATOR IPG BAROSTIM 2104 (Generator) IMPLANT
GLOVE SURG SS PI 7.5 STRL IVOR (GLOVE) ×1 IMPLANT
GOWN STRL REUS W/ TWL LRG LVL3 (GOWN DISPOSABLE) ×2 IMPLANT
GOWN STRL REUS W/ TWL XL LVL3 (GOWN DISPOSABLE) ×1 IMPLANT
KIT BASIN OR (CUSTOM PROCEDURE TRAY) ×1 IMPLANT
KIT TURNOVER KIT B (KITS) ×1 IMPLANT
LEAD CAROTID BAROSTIM 1036 (Lead) IMPLANT
NS IRRIG 1000ML POUR BTL (IV SOLUTION) ×2 IMPLANT
PACK CAROTID (CUSTOM PROCEDURE TRAY) ×1 IMPLANT
PAD ARMBOARD 7.5X6 YLW CONV (MISCELLANEOUS) ×2 IMPLANT
POSITIONER HEAD DONUT 9IN (MISCELLANEOUS) ×1 IMPLANT
SUT ETHIBOND CT1 BRD #0 30IN (SUTURE) ×2 IMPLANT
SUT ETHILON 3 0 PS 1 (SUTURE) IMPLANT
SUT PROLENE 6 0 BV (SUTURE) ×8 IMPLANT
SUT SILK 0 FSL (SUTURE) IMPLANT
SUT VIC AB 3-0 SH 27X BRD (SUTURE) ×2 IMPLANT
SUT VICRYL 4-0 PS2 18IN ABS (SUTURE) ×2 IMPLANT
SYR 5ML LL (SYRINGE) ×1 IMPLANT
SYR BULB IRRIG 60ML STRL (SYRINGE) ×1 IMPLANT
TOWEL GREEN STERILE (TOWEL DISPOSABLE) ×1 IMPLANT
WATER STERILE IRR 1000ML POUR (IV SOLUTION) ×1 IMPLANT

## 2023-11-14 NOTE — Anesthesia Procedure Notes (Signed)
Procedure Name: Intubation Date/Time: 11/14/2023 12:56 PM  Performed by: Sandie Ano, CRNAPre-anesthesia Checklist: Patient identified, Emergency Drugs available, Suction available and Patient being monitored Patient Re-evaluated:Patient Re-evaluated prior to induction Oxygen Delivery Method: Circle System Utilized Preoxygenation: Pre-oxygenation with 100% oxygen Induction Type: IV induction Ventilation: Mask ventilation without difficulty Laryngoscope Size: Mac and 3 Grade View: Grade I Tube type: Oral Tube size: 7.0 mm Number of attempts: 1 Airway Equipment and Method: Stylet and Oral airway Placement Confirmation: ETT inserted through vocal cords under direct vision, positive ETCO2 and breath sounds checked- equal and bilateral Secured at: 22 cm Tube secured with: Tape Dental Injury: Teeth and Oropharynx as per pre-operative assessment

## 2023-11-14 NOTE — Anesthesia Procedure Notes (Signed)
Arterial Line Insertion Start/End12/27/2024 12:44 PM, 11/14/2023 12:48 PM Performed by: Shelton Silvas, MD, anesthesiologist  Patient location: Pre-op. Preanesthetic checklist: patient identified, IV checked, site marked, risks and benefits discussed, surgical consent, monitors and equipment checked, pre-op evaluation, timeout performed and anesthesia consent Lidocaine 1% used for infiltration Right, radial was placed Catheter size: 20 G Hand hygiene performed  and maximum sterile barriers used   Attempts: 1 Procedure performed without using ultrasound guided technique. Following insertion, dressing applied and Biopatch. Post procedure assessment: normal and unchanged  Patient tolerated the procedure well with no immediate complications.

## 2023-11-14 NOTE — Op Note (Signed)
    Patient name: Allison White MRN: [id number] DOB: 06/26/1961 Sex: female  11/14/2023 Pre-operative Diagnosis: NYHA class II-III heart failure Post-operative diagnosis:  Same Surgeon:  Durene Cal Assistants:  Adonis Housekeeper, PA Procedure:   Insertion of right sided Barostim device Anesthesia:  General Blood Loss:  minimal Specimens:  none  Findings: Excellent hemodynamic response at position "a " Device: Serial #[id number] model 12/19/2002 Lead serial #[id number] model 1036 Pulse width: 125 Amplitude 1 mA Frequency: 40 PPS Lead impedance 634 Voltage 2.78   Indications: This is a 62 year old female with NYHA class II-III heart failure who remains symptomatic despite goal-directed medical therapy.  She comes in today for device insertion   Procedure:  The patient was identified in the holding area and taken to [facility] OR ROOM 16  The patient was then placed supine on the table. general anesthesia was administered.  The patient was prepped and draped in the usual sterile fashion.  A time out was called and antibiotics were administered.  A PA was necessary to expedite the procedure and assist with technical details.  She help with exposure by providing suction and retraction.  She help with the suturing by following a Prolene.  She help with wound closure.  Ultrasound was used to identify the bifurcation of the carotid artery in the right neck.  Incision was made along the anterior border the sternocleidomastoid for about 3 cm.  Cautery was used divide subcutaneous tissue and platysma muscle until I identified the common facial vein.  This was divided between silk ties.  The carotid bifurcation was then easily exposed.  Next a infraclavicular incision was made.  Cautery was used to divide subtenons tissue down to the clavipectoral fascia.  Then created a pocket for the battery with cautery and blunt dissection.  Next a tonsil clamp was used to create a tunnel between the 2 incisions.  The lead was  then brought through the tunnel and connected to the battery.  The battery was placed in the pocket and the lead was positioned at position "A'.  Testing was then performed.  She had an excellent rapid blood pressure drop.  I then placed three 6-0 Prolene sutures to anchor the lead at this position.  We repeated testing and she again had a quick drop in blood pressure.  I then placed 3 additional 6-0 Prolene sutures to secure the electrode at position a.  The strain relief loop was then secured to the common carotid adventitia with two 6-0 Prolene sutures.  The wounds were then irrigated with antibiotic solution.  Hemostasis was obtained with cautery.  The battery was then sutured to the clavipectoral fascia with Ethibond suture.  The neck incision was closed by reapproximating the platysma with 3-0 Vicryl and the skin with 4-0 Vicryl.  The chest incision was closed by reapproximating the subcutaneous tissue with 3-0 Vicryl and the skin with 4-0 Vicryl.  Dermabond was applied.  There were no immediate complications.   Disposition: To PACU stable.   Juleen China, M.D., [facility] [place] Office: [phone] Pager:  [phone]

## 2023-11-14 NOTE — Transfer of Care (Signed)
Immediate Anesthesia Transfer of Care Note  Patient: Allison White  Procedure(s) Performed: INSERTION OF Alexia Freestone (Right: Chest)  Patient Location: PACU  Anesthesia Type:General  Level of Consciousness: awake, alert , and oriented  Airway & Oxygen Therapy: Patient Spontanous Breathing and Patient connected to nasal cannula oxygen  Post-op Assessment: Report given to RN and Post -op Vital signs reviewed and stable  Post vital signs: Reviewed and stable  Last Vitals:  Vitals Value Taken Time  BP 129/70 11/14/23 1439  Temp    Pulse 62 11/14/23 1441  Resp 13 11/14/23 1441  SpO2 99 % 11/14/23 1441  Vitals shown include unfiled device data.  Last Pain:  Vitals:   11/14/23 1134  TempSrc:   PainSc: 0-No pain         Complications: No notable events documented.

## 2023-11-14 NOTE — Addendum Note (Signed)
Addendum  created 11/14/23 1522 by Shelton Silvas, MD   Child order released for a procedure order, Clinical Note Signed, Intraprocedure Blocks edited, LDA created via procedure documentation, Order Canceled from Note, SmartForm saved

## 2023-11-14 NOTE — H&P (Signed)
Expand All Collapse All                                     [facility]   Patient name: Allison White  MRN: [id number]        DOB: 1961-10-20            Sex: female     REQUESTING PROVIDER:     Dr. Gala Romney     REASON FOR CONSULT:    Barostim evaluation   HISTORY OF PRESENT ILLNESS:    Allison White is a 62 y.o. female, who is referred for Barostim evaluation.  The patient suffers from NYHA II-III symptoms.  She is on goal-directed medical therapy and having persistent symptoms.  She is very frustrated with her level of energy.  She is status post MI in 2018 with PCI her ejection fraction is in the 20-25% range.  She has a Biotronik ICD in place.  She has a history of left pseudoaneurysm stent placement in the carotid artery at the petrous portion in 2009 and 10 for FMD.   PAST MEDICAL HISTORY          Past Medical History:  Diagnosis Date   Cancer (HCC) 2022    Rt Breast   CHF (congestive heart failure) (HCC)     FMD (facioscapulohumeral muscular dystrophy) (HCC)     Headache(784.0)     Heart failure (HCC)     Hyperlipidemia     MI (myocardial infarction) (HCC)     Migraine     Nonruptured cerebral aneurysm, internal carotid artery      Left side, stent placement (2009)   Pseudoaneurysm (HCC)      both carotids    Vertigo              FAMILY HISTORY         Family History  Problem Relation Age of Onset   Thyroid disease Mother     Gastric cancer Mother 69        w/ signet ring features   Prostate cancer Father          d. 51   Hypertension Brother     Breast cancer Maternal Aunt 30   Brain cancer Maternal Aunt 72   Prostate cancer Paternal Uncle          x2 pat uncles   Prostate cancer Maternal Grandfather          metastatic; d. 3   Cerebral aneurysm Paternal Grandmother          Nonruptured   Dementia Paternal Grandfather     Prostate cancer Paternal Grandfather          metastatic          SOCIAL HISTORY:     Social History         Socioeconomic History   Marital status: Married      Spouse name: Tim   Number of children: 4   Years of education: Not on file   Highest education level: High school graduate  Occupational History   Not on file  Tobacco Use   Smoking status: Former      Current packs/day: 1.00      Average packs/day: 1 pack/day for 6.0 years (6.0 ttl pk-yrs)      Types: Cigarettes   Smokeless tobacco: Never   Tobacco comments:  Quit over 30 years ago  Vaping Use   Vaping status: Never Used  Substance and Sexual Activity   Alcohol use: Yes      Comment: Occasionally "little"   Drug use: No   Sexual activity: Not Currently  Other Topics Concern   Not on file  Social History Narrative    Lives with husband in a one-story home.      Right handed    Caffeine: "very little"    Social Determinants of Health        Financial Resource Strain: Low Risk  (03/05/2023)    Overall Financial Resource Strain (CARDIA)     Difficulty of Paying Living Expenses: Not hard at all  Recent Concern: Financial Resource Strain - Medium Risk (01/23/2023)    Overall Financial Resource Strain (CARDIA)     Difficulty of Paying Living Expenses: Somewhat hard  Food Insecurity: No Food Insecurity (01/23/2023)    Hunger Vital Sign     Worried About Running Out of Food in the Last Year: Never true     Ran Out of Food in the Last Year: Never true  Transportation Needs: No Transportation Needs (03/05/2023)    PRAPARE - Therapist, art (Medical): No     Lack of Transportation (Non-Medical): No  Physical Activity: Sufficiently Active (01/23/2023)    Exercise Vital Sign     Days of Exercise per Week: 4 days     Minutes of Exercise per Session: 90 min  Stress: No Stress Concern Present (01/23/2023)    [employer] of Occupational Health - Occupational Stress Questionnaire     Feeling of Stress : Not at all  Social Connections: Moderately Integrated (10/26/2020)     Social Connection and Isolation Panel [NHANES]     Frequency of Communication with Friends and Family: More than three times a week     Frequency of Social Gatherings with Friends and Family: Twice a week     Attends Religious Services: 1 to 4 times per year     Active Member of [employer] or Organizations: No     Attends Banker Meetings: Never     Marital Status: Married  Catering manager Violence: Not At Risk (01/23/2023)    Humiliation, Afraid, Rape, and Kick questionnaire     Fear of Current or Ex-Partner: No     Emotionally Abused: No     Physically Abused: No     Sexually Abused: No      ALLERGIES:      Allergies       Allergies  Allergen Reactions   Tape Rash      PREFERS CLOTH OR NOTHING   Plavix [Clopidogrel Bisulfate] Other (See Comments)      Joint pain   Praluent [Alirocumab]        Headaches, gastrointestinal issues   Statins        Myalgia, neck pain   Tamoxifen Nausea And Vomiting        CURRENT MEDICATIONS:            Current Outpatient Medications  Medication Sig Dispense Refill   aspirin 81 MG EC tablet Take 81 mg by mouth daily.        dapagliflozin propanediol (FARXIGA) 10 MG TABS tablet Take 1 tablet (10 mg total) by mouth daily. 90 tablet 3   dicyclomine (BENTYL) 10 MG capsule Take 1 capsule (10 mg total) by mouth 4 (four) times daily as needed for spasms. 120 capsule  0   digoxin (LANOXIN) 0.125 MG tablet Take 1 tablet by mouth once daily 90 tablet 3   ezetimibe (ZETIA) 10 MG tablet Take 1 tablet (10 mg total) by mouth daily. 90 tablet 1   HYDROcodone-acetaminophen (NORCO) 7.5-325 MG tablet Take 1 tablet by mouth every 6 (six) hours as needed for moderate pain. 30 tablet 0   HYDROcodone-acetaminophen (NORCO) 7.5-325 MG tablet Take 1 tablet by mouth every 6 (six) hours as needed for moderate pain. 30 tablet 0   ibuprofen (ADVIL) 200 MG tablet Take 200 mg by mouth as needed.       icosapent Ethyl (VASCEPA) 1 g capsule Take 2 capsules (2 g  total) by mouth 2 (two) times daily. 120 capsule 0   ivabradine (CORLANOR) 7.5 MG TABS tablet Take 1 tablet (7.5 mg total) by mouth 2 (two) times daily with a meal. 180 tablet 3   LORATADINE PO Take by mouth daily. [facility] brand       metoprolol succinate (TOPROL-XL) 50 MG 24 hr tablet Take 1 tablet by mouth twice daily 180 tablet 3   nitroGLYCERIN (NITROSTAT) 0.4 MG SL tablet Place 0.4 mg under the tongue every 5 (five) minutes as needed for chest pain.       omeprazole (PRILOSEC) 20 MG capsule Take 1 capsule by mouth once daily 90 capsule 0   pantoprazole (PROTONIX) 40 MG tablet Take 1 tablet (40 mg total) by mouth daily. 90 tablet 1   Phenazopyridine HCl (AZO TABS PO) Take 1 capsule by mouth as needed.       raloxifene (EVISTA) 60 MG tablet Take 1 tablet (60 mg total) by mouth daily. 90 tablet 3   spironolactone (ALDACTONE) 25 MG tablet Take 1 tablet (25 mg total) by mouth daily. 90 tablet 2   torsemide (DEMADEX) 100 MG tablet Take 0.5 tablets (50 mg total) by mouth daily. 45 tablet 3      No current facility-administered medications for this visit.        REVIEW OF SYSTEMS:    [X]  denotes positive finding, [ ]  denotes negative finding Cardiac   Comments:  Chest pain or chest pressure:      Shortness of breath upon exertion:      Short of breath when lying flat:      Irregular heart rhythm:             Vascular      Pain in calf, thigh, or hip brought on by ambulation:      Pain in feet at night that wakes you up from your sleep:       Blood clot in your veins:      Leg swelling:              Pulmonary      Oxygen at home:      Productive cough:       Wheezing:              Neurologic      Sudden weakness in arms or legs:       Sudden numbness in arms or legs:       Sudden onset of difficulty speaking or slurred speech:      Temporary loss of vision in one eye:       Problems with dizziness:              Gastrointestinal      Blood in stool:         Vomited  blood:               Genitourinary      Burning when urinating:       Blood in urine:             Psychiatric      Major depression:              Hematologic      Bleeding problems:      Problems with blood clotting too easily:             Skin      Rashes or ulcers:             Constitutional      Fever or chills:        PHYSICAL EXAM:       Vitals:    08/18/23 1048  BP: 116/81  Pulse: 66  Resp: 20  Temp: 97.9 F (36.6 C)  SpO2: 96%  Weight: 206 lb 4.8 oz (93.6 kg)  Height: 5\' 5"  (1.651 m)      GENERAL: The patient is a well-nourished female, in no acute distress. The vital signs are documented above. CARDIAC: There is a regular rate and rhythm.  VASCULAR: Ultrasound was used to evaluate her carotid bifurcation which is in the mid neck.  No significant plaque was seen. PULMONARY: Nonlabored respirations MUSCULOSKELETAL: There are no major deformities or cyanosis. NEUROLOGIC: No focal weakness or paresthesias are detected. SKIN: There are no ulcers or rashes noted. PSYCHIATRIC: The patient has a normal affect.   STUDIES:    I have reviewed the following:   Right Carotid: There is no evidence of stenosis in the right ICA.   Left Carotid: There is no evidence of stenosis in the left ICA.   Vertebrals:  Bilateral vertebral arteries demonstrate antegrade flow.  Subclavians: Normal flow hemodynamics were seen in bilateral subclavian               arteries.  ASSESSMENT and PLAN    NYHA class II-III: The patient would be an excellent candidate for Barostim activation therapy.  Despite her goal-directed medical therapy, she is having persistent symptoms of fatigue and low energy.  I discussed the details of the operation as well as the risks and benefits.  All of her questions were answered.  We will work on Therapist, occupational and get her scheduled on the right side.     Charlena Cross, MD, FACS [facility] [phone] Pager [phone]       Cleared by Cardiology yesterday Plan for right sided Barostiom device implant.  All questions answered  Durene Cal

## 2023-11-14 NOTE — Anesthesia Postprocedure Evaluation (Signed)
Anesthesia Post Note  Patient: Allison White  Procedure(s) Performed: INSERTION OF Alexia Freestone (Right: Chest)     Patient location during evaluation: PACU Anesthesia Type: General Level of consciousness: awake and alert Pain management: pain level controlled Vital Signs Assessment: post-procedure vital signs reviewed and stable Respiratory status: spontaneous breathing, nonlabored ventilation, respiratory function stable and patient connected to nasal cannula oxygen Cardiovascular status: blood pressure returned to baseline and stable Postop Assessment: no apparent nausea or vomiting Anesthetic complications: no  No notable events documented.  Last Vitals:  Vitals:   11/14/23 1439 11/14/23 1445  BP: 129/70 117/73  Pulse: 64 66  Resp:  11  Temp: 36.9 C   SpO2: 98% 93%    Last Pain:  Vitals:   11/14/23 1439  TempSrc:   PainSc: 0-No pain                 Phuc Kluttz S

## 2023-11-18 ENCOUNTER — Encounter: Payer: Self-pay | Admitting: Surgery

## 2023-11-18 ENCOUNTER — Telehealth: Payer: Self-pay

## 2023-11-18 NOTE — Telephone Encounter (Signed)
 Reached out to patient after she had sent a message through her mychart to discuss symptoms she was having s/p Barostim implant on 12/27 with Dr Serene.  Pt stated that her right lower lip was still numb and that she was having trouble moving the bottom corner of her mouth. She denied any other stroke like symptoms such as slurred speech and drooping of the face or weakness in one side of the body. She stated that she did have some swelling still on the jaw line and it felt tingly in that area.  I reviewed above with Joane and Maureen,Pas in the office. They stated that this is normal after the procedure due to the manipulation of the nerves in the neck to place the device. As long as there were no other symptoms of concern, patient should follow up as planned with her cardiology team. I relayed all of the above to the patient and reassured her that this was normal. She was appreciative of the call. I told her if there were any other issues to reach back out to the office or go to the ED if necessary.

## 2023-11-18 NOTE — Telephone Encounter (Signed)
PRIOR AUTH WAS DONE THROUGH PATIENT'S INSURANCE AND PATIENT WAS APPROVED FOR REPATHA FOR 11/18/23 TO 05/16/2024.

## 2023-11-20 ENCOUNTER — Telehealth (HOSPITAL_COMMUNITY): Payer: Self-pay | Admitting: Pharmacy Technician

## 2023-11-20 ENCOUNTER — Encounter (HOSPITAL_COMMUNITY): Payer: Self-pay | Admitting: Internal Medicine

## 2023-11-20 NOTE — Telephone Encounter (Signed)
 Advanced Heart Failure Patient Advocate Encounter  The patient was approved for a [employer] grant that will help cover the cost of Repatha . Total amount awarded, $2,500. Eligibility, 12/22/23 - 12/20/24.  ID [id number]  BIN [id number]  PCN [id number]  Group [id number]  Provided information via mychart message.  Allison JULIANNA Pa, CPhT

## 2023-12-01 ENCOUNTER — Other Ambulatory Visit: Payer: Self-pay | Admitting: Family Medicine

## 2023-12-04 ENCOUNTER — Other Ambulatory Visit: Payer: Self-pay

## 2023-12-04 NOTE — Telephone Encounter (Signed)
Just for clarification how many mg of fenofibrate because I could not find 60 mg.

## 2023-12-04 NOTE — Telephone Encounter (Signed)
Patient was approved for repatha. Does she need to be in fenofibrate also? She is taking vascepa. Please advice.

## 2023-12-05 NOTE — Progress Notes (Unsigned)
  Electrophysiology Office Note:   ID:  Allison White, DOB [birth year], MRN [id number]  Primary Cardiologist: None Electrophysiologist: Will Jorja Loa, MD  {Click to update primary MD,subspecialty MD or APP then REFRESH:1}    History of Present Illness:   Allison White is a 63 y.o. female with h/o CAD (STEMI 11/19/2016 that is post drug-eluting stent to the LAD and diagonal bifurcation), ICM, chronic CHF, ICD, breast ca (lumpectomy x2 and radiation of her left breast), HLD, HTN, PVD (carotid artery dissection with stenting of left carotid due to possible FMD) seen today for post hospital follow up.    Admitted 11/14/2023 for planned insertion of Barostim.   Since discharge from hospital the patient reports doing ***.  she denies chest pain, palpitations, dyspnea, PND, orthopnea, nausea, vomiting, dizziness, syncope, edema, weight gain, or early satiety.   Review of systems complete and found to be negative unless listed in HPI.   EP Information / Studies Reviewed:    EKG is not ordered today. EKG from 11/13/2023 reviewed which showed Apaced V sensed rhythm at 72 bpm        ICD Interrogation-  reviewed in detail today,  See PACEART report.  Device History: Biotronik dual chamber ICD implanted 02/25/2017  Barostim (commercial) 11/14/2023  Echo 01/09/23: EF 20-25% RV ok G2 DD mod MR   Physical Exam:   VS:  There were no vitals taken for this visit.   Wt Readings from Last 3 Encounters:  11/14/23 209 lb (94.8 kg)  11/13/23 210 lb 9.6 oz (95.5 kg)  11/07/23 203 lb 11.2 oz (92.4 kg)     GEN: No acute distress *** NECK: No JVD; No carotid bruits CARDIAC: {EPRHYTHM:28826}, no murmurs, rubs, gallops RESPIRATORY:  Clear to auscultation without rales, wheezing or rhonchi  ABDOMEN: Soft, non-tender, non-distended EXTREMITIES:  {EDEMA LEVEL:28147::"No"} edema; No deformity   ASSESSMENT AND PLAN:    Chronic systolic CHF s/p Biotronik and Barostim implantation ICM NYHA *** symptoms.  ***  Device titrated from 1.0 millamp to *** milliamp by increments of 0.4 with good blood pressure response and no stimulation.  Device impedence stable. Pt goals are to ***  Normal device function See scanned report. Will follow up in *** weeks to continue titration with goal of 6-8 milliamps for chronic settings.  Denies ischemic symptoms  Disposition:   Follow up with {EPPROVIDERS:28135} in {EPFOLLOW UP:28173}  Signed, Graciella Freer, PA-C

## 2023-12-07 ENCOUNTER — Encounter: Payer: Self-pay | Admitting: Family Medicine

## 2023-12-08 ENCOUNTER — Other Ambulatory Visit: Payer: Self-pay

## 2023-12-08 ENCOUNTER — Encounter: Payer: Self-pay | Admitting: Student

## 2023-12-08 ENCOUNTER — Ambulatory Visit: Payer: PPO | Attending: Student | Admitting: Student

## 2023-12-08 VITALS — BP 124/84 | HR 66 | Ht 65.0 in | Wt 205.0 lb

## 2023-12-08 DIAGNOSIS — I255 Ischemic cardiomyopathy: Secondary | ICD-10-CM

## 2023-12-08 DIAGNOSIS — I5022 Chronic systolic (congestive) heart failure: Secondary | ICD-10-CM | POA: Diagnosis not present

## 2023-12-08 MED ORDER — HYDROCODONE-ACETAMINOPHEN 7.5-325 MG PO TABS: 1.0000 | ORAL_TABLET | Freq: Four times a day (QID) | ORAL | 0 refills | Status: DC | PRN

## 2023-12-08 NOTE — Patient Instructions (Signed)
 Medication Instructions:  Your physician recommends that you continue on your current medications as directed. Please refer to the Current Medication list given to you today.  *If you need a refill on your cardiac medications before your next appointment, please call your pharmacy*  Lab Work: None ordered If you have labs (blood work) drawn today and your tests are completely normal, you will receive your results only by: MyChart Message (if you have MyChart) OR A paper copy in the mail If you have any lab test that is abnormal or we need to change your treatment, we will call you to review the results.  Follow-Up: At [facility], you and your health needs are our priority.  As part of our continuing mission to provide you with exceptional heart care, we have created designated Provider Care Teams.  These Care Teams include your primary Cardiologist (physician) and Advanced Practice Providers (APPs -  Physician Assistants and Nurse Practitioners) who all work together to provide you with the care you need, when you need it.   Your next appointment:   As scheduled  Provider:   Casimiro Needle "Otilio Saber, [place]

## 2023-12-11 ENCOUNTER — Ambulatory Visit (INDEPENDENT_AMBULATORY_CARE_PROVIDER_SITE_OTHER): Payer: HMO

## 2023-12-11 DIAGNOSIS — I255 Ischemic cardiomyopathy: Secondary | ICD-10-CM

## 2023-12-11 LAB — CUP PACEART REMOTE DEVICE CHECK
Date Time Interrogation Session: 20250123084135
Implantable Lead Connection Status: 753985
Implantable Lead Connection Status: 753985
Implantable Lead Implant Date: 20180410
Implantable Lead Implant Date: 20180410
Implantable Lead Location: 753859
Implantable Lead Location: 753860
Implantable Lead Model: 377
Implantable Lead Model: 402266
Implantable Lead Serial Number: 49794726
Implantable Lead Serial Number: 49838890
Implantable Pulse Generator Implant Date: 20180410
Pulse Gen Model: 404622
Pulse Gen Serial Number: 60982098

## 2023-12-12 NOTE — Telephone Encounter (Signed)
Appointment available Monday @ 11AM w/ AT. LM to see if patient would like this appointment slot.

## 2023-12-15 ENCOUNTER — Ambulatory Visit: Payer: PPO | Attending: Student | Admitting: Student

## 2023-12-15 DIAGNOSIS — I5022 Chronic systolic (congestive) heart failure: Secondary | ICD-10-CM | POA: Diagnosis not present

## 2023-12-15 NOTE — Progress Notes (Signed)
  Called with jaw "tooth-ache" like pain since titration last week.   Singular episode with each impedance check, overall sensation improves with down-titration but persisted as far down as 2.0 ma @ 125 [place].   Pulse width decrease to 65 ms with improvement, but ultimately had to again titrate down to 2.0 ma for full improvement.    She verbalizes understanding that equivocally this is near baseline from what she left the OR with due to the decreased pulse width.   She had noted improvement of some of her symptoms; Was able to wash dishes without leaning on the sink, and chose to take the stairs at a concert this weekend.   She is very prone to HAs; hopefully turning her back down will give her increased time to adjust and we will be able to go back up in the near future.   Casimiro Needle [address]" Diaz, PA-C  12/15/2023 11:27 AM

## 2023-12-15 NOTE — Patient Instructions (Signed)
Medication Instructions:  Your physician recommends that you continue on your current medications as directed. Please refer to the Current Medication list given to you today.  *If you need a refill on your cardiac medications before your next appointment, please call your pharmacy*  Lab Work: None ordered If you have labs (blood work) drawn today and your tests are completely normal, you will receive your results only by: MyChart Message (if you have MyChart) OR A paper copy in the mail If you have any lab test that is abnormal or we need to change your treatment, we will call you to review the results.  Follow-Up: At [facility], you and your health needs are our priority.  As part of our continuing mission to provide you with exceptional heart care, we have created designated Provider Care Teams.  These Care Teams include your primary Cardiologist (physician) and Advanced Practice Providers (APPs -  Physician Assistants and Nurse Practitioners) who all work together to provide you with the care you need, when you need it.  Your next appointment:   As scheduled

## 2023-12-22 ENCOUNTER — Encounter (HOSPITAL_COMMUNITY): Payer: Self-pay | Admitting: Internal Medicine

## 2023-12-22 ENCOUNTER — Ambulatory Visit (HOSPITAL_COMMUNITY)
Admission: RE | Admit: 2023-12-22 | Discharge: 2023-12-22 | Disposition: A | Discharge status: Home / Self Care | Payer: PPO | Source: Ambulatory Visit | Attending: Internal Medicine | Admitting: Internal Medicine

## 2023-12-22 VITALS — BP 108/60 | HR 67 | Wt 203.4 lb

## 2023-12-22 DIAGNOSIS — Z95828 Presence of other vascular implants and grafts: Secondary | ICD-10-CM | POA: Insufficient documentation

## 2023-12-22 DIAGNOSIS — Z853 Personal history of malignant neoplasm of breast: Secondary | ICD-10-CM | POA: Insufficient documentation

## 2023-12-22 DIAGNOSIS — D751 Secondary polycythemia: Secondary | ICD-10-CM | POA: Diagnosis not present

## 2023-12-22 DIAGNOSIS — R002 Palpitations: Secondary | ICD-10-CM | POA: Insufficient documentation

## 2023-12-22 DIAGNOSIS — I255 Ischemic cardiomyopathy: Secondary | ICD-10-CM | POA: Insufficient documentation

## 2023-12-22 DIAGNOSIS — Z6833 Body mass index (BMI) 33.0-33.9, adult: Secondary | ICD-10-CM | POA: Diagnosis not present

## 2023-12-22 DIAGNOSIS — E782 Mixed hyperlipidemia: Secondary | ICD-10-CM | POA: Diagnosis not present

## 2023-12-22 DIAGNOSIS — I110 Hypertensive heart disease with heart failure: Secondary | ICD-10-CM | POA: Diagnosis not present

## 2023-12-22 DIAGNOSIS — I2510 Atherosclerotic heart disease of native coronary artery without angina pectoris: Secondary | ICD-10-CM | POA: Diagnosis not present

## 2023-12-22 DIAGNOSIS — I252 Old myocardial infarction: Secondary | ICD-10-CM | POA: Insufficient documentation

## 2023-12-22 DIAGNOSIS — I5022 Chronic systolic (congestive) heart failure: Secondary | ICD-10-CM | POA: Insufficient documentation

## 2023-12-22 DIAGNOSIS — Z955 Presence of coronary angioplasty implant and graft: Secondary | ICD-10-CM | POA: Insufficient documentation

## 2023-12-22 DIAGNOSIS — Z95810 Presence of automatic (implantable) cardiac defibrillator: Secondary | ICD-10-CM

## 2023-12-22 DIAGNOSIS — Z923 Personal history of irradiation: Secondary | ICD-10-CM | POA: Insufficient documentation

## 2023-12-22 NOTE — Progress Notes (Addendum)
[facility] Note  Date:  12/22/2023   ID:  Allison White, DOB [birth year], MRN [id number]  Location: Home  Provider location: [facility] Type of Visit: Established patient  PCP:  Allison Ohara, MD  Cardiologist:  None Primary HF: Allison White  Chief Complaint: Heart Failure follow-up   History of Present Illness:  Allison White is a 63 y.o. female with morbid obesity, HTN, carotid artery dissection with stenting of left carotid due to possible FMD, CAD s/p anterior MI 1/18 (DES to LAD), systolic HF with EF 20-25% s/p Biotronik ICD.   Seen in 5/21 with worsening NYHA IIIb symptoms. Had R/L cath with normal coronary arteries; widely patent LAD stent, EF 20-25%, Normal hemodynamics with CI 3.4. -> CPX  = pVO2: 15.3 (90% predicted pVO2) - adjusted to iBW 25.6ml/kg/min VE/VCO2 slope:  33   Seen in HF clinic 01/2021 with orthostasis. Reds 30%. Recommended ted hose. Sleep study deferred at patient/husband request.   Had lumpectomy 9/22 for abnormal mammogram. Finished 6 weeks of XRT in 12/22. No chemo.   Echo 11/22 EF 25-30% G1DD  RV ok   Echo 01/09/23: EF 20-25% RV ok G2 DD mod MR Personally reviewed  Underwent Barostim on 11/14/23  Feels like she has gotten some better with Barostim but device had to be turned down recently because she was having a toothache. She has good days and bad days. On good days can go up steps. On bad days can be SOB with ADLs. BP improved with Barostim. No edema, orthopnea or PND.   Cardiac Studies Echo 06/2017 EF 20-25% Echo 02/2018 LVEF 25-30%, Trivial MR, Normal RV, Mild TR, PA peak pressure 26 mm Hg Echo 8/20  EF 20-25% RV ok. Echo 12/21 EF 25%   CPX 8/21 FVC 3.08 (91%)      FEV1 2.30 (87%%)        FEV1/FVC 75 (94%)        MVV 89 (94%)  RER 1.10 Peak VO2: 15.3 (90% predicted peak VO2) - adjusted to iBW 25.32ml/kg/min VE/VCO2 slope:  33   Studies: R/L cath 04/07/20 Ao = 102/56 (75) LV = 104/13 RA =  3 RV = 27/4 PA = 28/9 (17) PCW = 7 Fick cardiac output/index = 7.2/3.4 PVR = 1.4 WU FA sat = 99% PA sat = 79%, 81% High SVC sat = 81%  Monitor 10/19 1. Sinus rhythm 2. Rare PVCs and bigeminy (< 1.0%) 3. Two patient-triggered events associated with isolated PVCs.  4. No high-grade arrhythmias     CPX 03/17/18 FVC 3.00 (87%), FEV1 2.97(88%), FEV1/FVC 79 (99%)  Peak VO2: 13.8 - When adjusted to the patient's ideal body weight of 142.2 lb (64.5 kg) the peak VO2 is 22.6 ml/kg VE/VCO2 slope: 38 OUES: 1.52 Peak RER: 1.11 Ventilatory Threshold: 11.8 VE/MVV:  95% O2pulse:  8 Interpretation: Mild limitation due to HF and obesity    Past Medical History:  Diagnosis Date   AICD (automatic cardioverter/defibrillator) present 02/25/2017   Biotronik- Dual PPM/AICD   Arthritis    Cancer (HCC) 2022   Rt Breast   CHF (congestive heart failure) (HCC)    Coronary artery disease    Diabetes mellitus without complication (HCC)    Fibromuscular dysplasia (HCC)    FMD (facioscapulohumeral muscular dystrophy) (HCC)    Headache(784.0)    Heart failure (HCC)    Hyperlipidemia    MI (myocardial infarction) (HCC) 11/19/2016   s/p DES LAD   Migraine  Nonruptured cerebral aneurysm, internal carotid artery 07/14/2008   Left ICA stent placed for pseudoaneurysm, likely from dissection related to fibromuscular dysplasia009)   Palpitations    PONV (postoperative nausea and vomiting)    Pseudoaneurysm (HCC)    both carotids    Stroke (HCC) 06/10/2008   when carotid artery dissected, she was told she had a stroke (pt states she has had dissection on both sides); s/p left ICA stent 07/14/08   Vertigo    Past Surgical History:  Procedure Laterality Date   ABDOMINAL HYSTERECTOMY  08/16/2004   Uterus and Cervix (per pathology)   BREAST LUMPECTOMY Right 07/2021   CARDIAC DEFIBRILLATOR PLACEMENT     CEREBRAL ANEURYSM REPAIR Left 2009   CESAREAN SECTION     CORONARY ANGIOPLASTY WITH STENT  PLACEMENT     RIGHT/LEFT HEART CATH AND CORONARY ANGIOGRAPHY N/A 04/07/2020   Procedure: RIGHT/LEFT HEART CATH AND CORONARY ANGIOGRAPHY;  Surgeon: Dolores Patty, MD;  Location: [facility];  Service: Cardiovascular;  Laterality: N/A;   TUBAL LIGATION       Current Outpatient Medications  Medication Sig Dispense Refill   aspirin 81 MG EC tablet Take 81 mg by mouth daily.      dapagliflozin propanediol (FARXIGA) 10 MG TABS tablet Take 1 tablet (10 mg total) by mouth daily. 90 tablet 3   digoxin (LANOXIN) 0.125 MG tablet Take 1 tablet by mouth once daily 90 tablet 3   Evolocumab (REPATHA SURECLICK) 140 MG/ML SOAJ Inject 140 mg into the skin every 14 (fourteen) days. 2 mL 2   HYDROcodone-acetaminophen (NORCO) 7.5-325 MG tablet Take 1 tablet by mouth every 6 (six) hours as needed for moderate pain (pain score 4-6). 30 tablet 0   ibuprofen (ADVIL) 200 MG tablet Take 200-400 mg by mouth every 6 (six) hours as needed for moderate pain (pain score 4-6).     icosapent Ethyl (VASCEPA) 1 g capsule Take 2 capsules by mouth twice daily 120 capsule 3   ivabradine (CORLANOR) 7.5 MG TABS tablet Take 1 tablet (7.5 mg total) by mouth 2 (two) times daily with a meal. 180 tablet 3   loratadine (CLARITIN) 10 MG tablet Take 10 mg by mouth daily. [facility] brand     metoprolol succinate (TOPROL-XL) 50 MG 24 hr tablet Take 1 tablet by mouth twice daily 180 tablet 3   nitroGLYCERIN (NITROSTAT) 0.4 MG SL tablet Place 0.4 mg under the tongue every 5 (five) minutes as needed for chest pain.     pantoprazole (PROTONIX) 40 MG tablet Take 1 tablet (40 mg total) by mouth daily. 90 tablet 1   Phenazopyridine HCl (AZO TABS PO) Take 1 tablet by mouth daily as needed (painful urination).     raloxifene (EVISTA) 60 MG tablet Take 1 tablet (60 mg total) by mouth daily. 90 tablet 3   spironolactone (ALDACTONE) 25 MG tablet Take 1 tablet by mouth once daily 90 tablet 0   torsemide (DEMADEX) 100 MG tablet Take 0.5  tablets (50 mg total) by mouth daily. 45 tablet 3   No current facility-administered medications for this encounter.    Allergies:   Tape, Plavix [clopidogrel bisulfate], Praluent [alirocumab], Statins, and Tamoxifen   Social History:  The patient  reports that she quit smoking about 42 years ago. Her smoking use included cigarettes. She has a 6 pack-year smoking history. She has never used smokeless tobacco. She reports that she does not currently use alcohol. She reports that she does not use drugs.   Family  History:  The patient's family history includes Brain cancer (age of onset: 41) in her maternal aunt; Breast cancer (age of onset: 6) in her maternal aunt; Cerebral aneurysm in her paternal grandmother; Dementia in her paternal grandfather; Gastric cancer (age of onset: 13) in her mother; Hypertension in her brother; Prostate cancer in her father, maternal grandfather, paternal grandfather, and paternal uncle; Thyroid disease in her mother.   ROS:  Please see the history of present illness.   All other systems are personally reviewed and negative.   Body mass index is 33.85 kg/m.  Vitals:   12/22/23 1420  BP: 108/60  Pulse: 67  SpO2: 96%  Weight: 92.3 kg (203 lb 6.4 oz)      Wt Readings from Last 3 Encounters:  12/22/23 92.3 kg (203 lb 6.4 oz)  12/08/23 93 kg (205 lb)  11/14/23 94.8 kg (209 lb)    Exam:   General:  Well appearing. No resp difficulty HEENT: normal  R lower lip retracted Neck: supple. no JVD. Carotids 2+ bilat; no bruits. No lymphadenopathy or thryomegaly appreciated. Cor: PMI nondisplaced. Regular rate & rhythm. 2/6 SEM RSB Site well healed Lungs: clear Abdomen: obese soft, nontender, nondistended. No hepatosplenomegaly. No bruits or masses. Good bowel sounds. Extremities: no cyanosis, clubbing, rash, edema Neuro: alert & orientedx3, cranial nerves grossly intact. moves all 4 extremities w/o difficulty. Affect pleasant  Recent Labs: 12/24/2022: TSH  3.720 01/09/2023: B Natriuretic Peptide 97.5 11/07/2023: ALT 19; Platelets 304 11/14/2023: BUN 10; Creatinine, Ser 0.80; Hemoglobin 15.6; Potassium 3.9; Sodium 141    Wt Readings from Last 3 Encounters:  12/22/23 92.3 kg (203 lb 6.4 oz)  12/08/23 93 kg (205 lb)  11/14/23 94.8 kg (209 lb)      ASSESSMENT AND PLAN:  1. Chronic systolic HF, ICM - Echo 02/2018 LVEF 25-30%, Trivial MR, Normal RV, Mild TR, PA peak pressure 26 mm Hg - s/p Biotronik ICD (Followed by Dr. Gerre Pebbles) - Echo 07/13/19 EF 20-25% - Cath 5/21 EF 20-25% normal coronaries. Well compensated hemodynamics with CI 3.4 - Echo 12/21 EF 25% - CPX 8/21 very reassuring - Peak VO2: 15.3 (90% predicted peak VO2) - adjusted to iBW 25.81ml/kg/min. VEVCO2 33  - Echo 11/22 EF 25-30% G1DD  RV ok  - Echo 01/09/23: EF 20-25% RV ok G2 DD mod MR Personally reviewed - Underwent Barostim implant 11/14/23   - Slightly improved with Barostim NYHA II-III - Volume status stable on torsemide 50mg  daily - Entresto stopped due to low BP - Cotninue losartan 25 at bedtime - - Continue spiro 25 mg daily.  - Continue toprol 50 mg BID - Continue digoxin 0.125 mg.  - Continue Farxiga 10 mg daily.  - Continue ivabradine 7.5 bid - On excellent GDMT.  Continue to titrate Barostim as tolerated. F/u Alejandro Mulling 2/18 - Repeat echo next visit   2. CAD s/p anterior MI 1/18 with DES to LAD in [place] - cath 5/21 normal cors with patent LAD stent - no s/s angina - Failed statin. Now on Zetia/Vascepa. Recently started repatha - Goal LDL < 70.   3. Obesity Body mass index is 33.85 kg/m. - Consider GLP1RA as needed   4. 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 No changes   5. Palpitations - Intermittent. - Zio patch 10/19 was ok  - having some palpitations with Barostim  6. Breast Cancer -  s/p lumpectomy  9/22 followed by XRT. Completed 12/22 - no plans for chemo  7. Polycythemia - followed by hematology. BMBx was normal - Home sleep study 1/22 AHI 4.6  - Last hgb 16.5   Signed, [name], MD  12/22/2023 2:26 PM  [facility] [address] [facility] [place] [address] [phone] (office) [phone] (fax)

## 2023-12-22 NOTE — Addendum Note (Signed)
Encounter addended by: Chinita Pester, CMA on: 12/22/2023 3:04 PM  Actions taken: Clinical Note Signed

## 2023-12-22 NOTE — Patient Instructions (Signed)
No Labs done today.  No medication changes were made. Please continue all current medications as prescribed.  Your physician recommends that you schedule a follow-up appointment in: 4-6 months. Please contact our office in April to schedule a June,July or August 2025 appointment.   If you have any questions or concerns before your next appointment please send [place] a message through [place] or call our office at [phone].    TO LEAVE A MESSAGE FOR THE NURSE SELECT OPTION 2, PLEASE LEAVE A MESSAGE INCLUDING: YOUR NAME DATE OF BIRTH CALL BACK NUMBER REASON FOR CALL**this is important as we prioritize the call backs  YOU WILL RECEIVE A CALL BACK THE SAME DAY AS LONG AS YOU CALL BEFORE 4:00 PM   Do the following things EVERYDAY: Weigh yourself in the morning before breakfast. Write it down and keep it in a log. Take your medicines as prescribed Eat low salt foods--Limit salt (sodium) to 2000 mg per day.  Stay as active as you can everyday Limit all fluids for the day to less than 2 liters   At the [facility], you and your health needs are our priority. As part of our continuing mission to provide you with exceptional heart care, we have created designated Provider Care Teams. These Care Teams include your primary Cardiologist (physician) and Advanced Practice Providers (APPs- Physician Assistants and Nurse Practitioners) who all work together to provide you with the care you need, when you need it.   You may see any of the following providers on your designated Care Team at your next follow up: Dr [name] Dr Marca Ancona Dr. Marcos Eke, NP Robbie Lis, [place] [facility] [place], [place] Brynda Peon, NP Karle Plumber, PharmD   Please be sure to bring in all your medications bottles to every appointment.    Thank you for choosing [facility]

## 2023-12-29 ENCOUNTER — Other Ambulatory Visit: Payer: Self-pay | Admitting: Family Medicine

## 2023-12-29 ENCOUNTER — Other Ambulatory Visit: Payer: Self-pay | Admitting: Oncology

## 2024-01-02 ENCOUNTER — Ambulatory Visit: Payer: PPO | Admitting: Student

## 2024-01-05 NOTE — Progress Notes (Unsigned)
  Electrophysiology Office Note:   ID:  Allison White, DOB 29-Apr-1961, MRN [id number]  Primary Cardiologist: None Electrophysiologist: Will Jorja Loa, MD      History of Present Illness:   Allison White is a 63 y.o. female with h/o CAD (STEMI 11/19/2016 that is post drug-eluting stent to the LAD and diagonal bifurcation), ICM, chronic CHF, ICD, breast ca (lumpectomy x2 and radiation of her left breast), HLD, HTN, PVD (carotid artery dissection with stenting of left carotid due to possible FMD) seen today for post hospital follow up.    Admitted 11/14/2023 for planned insertion of Barostim.   At prior visit titrated device titrated from 1.0 millamp to 3.6 milliamp by increments of 0.4 with good blood pressure response. Pt had stim at 5.0 that resolved at 4.6  Returned to clinic with "tooth-ache" like pain in her jaw since titration. Had to decrease pulse width as well as turn device down significantly to resolve symptoms. Left at 2.10ma @ 65 [place].   Since last visit pt has been feeling well. She felt better functionally at higher programmed output, but stim caused persistent HAs. At lower programming she has felt less functional (but still better than prior baseline). Continues to have some lack of movement in her R lower lip. Having good and great days, now. Goals remain to be able to work outside this spring and increase her functional status overall.   Review of systems complete and found to be negative unless listed in HPI.    EP Information / Studies Reviewed:    EKG is not ordered today. EKG from 11/13/2023 reviewed which showed Apaced V sensed rhythm at 72 bpm        ICD Interrogation-  reviewed in detail today,  See PACEART report.  Arrhythmia/Device History Biotronik dual chamber ICD implanted 02/25/2017  Barostim (commercial) 11/14/2023  HF MD-Dr Bensimhon   Echo 01/09/23: EF 20-25% RV ok G2 DD mod MR   Physical Exam:   VS:  BP 102/60   Pulse 86   Ht 5\' 5"  (1.651 m)   Wt  207 lb (93.9 kg)   SpO2 96%   BMI 34.45 kg/m    Wt Readings from Last 3 Encounters:  01/06/24 207 lb (93.9 kg)  12/22/23 203 lb 6.4 oz (92.3 kg)  12/08/23 205 lb (93 kg)     GEN: Well nourished, well developed in no acute distress NECK: No JVD; No carotid bruits CARDIAC: Regular rate and rhythm, no murmurs, rubs, gallops RESPIRATORY:  Clear to auscultation without rales, wheezing or rhonchi  ABDOMEN: Soft, non-tender, non-distended EXTREMITIES:  No edema; No deformity    ASSESSMENT AND PLAN:    Chronic systolic CHF s/p Biotronik and Barostim implantation ICM NYHA II-III symptoms.  Device titrated from 2.0 millamp to 3.4 milliamp by increments of 0.4 with good blood pressure response. Pt had stim at 4.8 that improved significantly but remained at 3.8. Described as tooth pain in the back of her jaw Device impedence stable. Pt goals are to increase functional status, play with grandkids, and work outside this spring.  Normal device function See scanned report. Denies ischemic symptoms  Disposition:   Follow up with EP APP in 2-3 weeks.   Signed, Graciella Freer, PA-C

## 2024-01-06 ENCOUNTER — Other Ambulatory Visit: Payer: Self-pay | Admitting: Family Medicine

## 2024-01-06 ENCOUNTER — Encounter: Payer: Self-pay | Admitting: Student

## 2024-01-06 ENCOUNTER — Ambulatory Visit: Payer: PPO | Attending: Student | Admitting: Student

## 2024-01-06 VITALS — BP 102/60 | HR 86 | Ht 65.0 in | Wt 207.0 lb

## 2024-01-06 DIAGNOSIS — I5022 Chronic systolic (congestive) heart failure: Secondary | ICD-10-CM | POA: Diagnosis not present

## 2024-01-06 DIAGNOSIS — I2510 Atherosclerotic heart disease of native coronary artery without angina pectoris: Secondary | ICD-10-CM

## 2024-01-06 DIAGNOSIS — I255 Ischemic cardiomyopathy: Secondary | ICD-10-CM | POA: Diagnosis not present

## 2024-01-06 DIAGNOSIS — Z95810 Presence of automatic (implantable) cardiac defibrillator: Secondary | ICD-10-CM

## 2024-01-06 MED ORDER — HYDROCODONE-ACETAMINOPHEN 7.5-325 MG PO TABS
1.0000 | ORAL_TABLET | Freq: Four times a day (QID) | ORAL | 0 refills | Status: DC | PRN
Start: 2024-01-06 — End: 2024-01-28

## 2024-01-06 NOTE — Patient Instructions (Signed)
 Medication Instructions:  Your physician recommends that you continue on your current medications as directed. Please refer to the Current Medication list given to you today.  *If you need a refill on your cardiac medications before your next appointment, please call your pharmacy*  Lab Work: None ordered If you have labs (blood work) drawn today and your tests are completely normal, you will receive your results only by: MyChart Message (if you have MyChart) OR A paper copy in the mail If you have any lab test that is abnormal or we need to change your treatment, we will call you to review the results.  Follow-Up: At [facility], you and your health needs are our priority.  As part of our continuing mission to provide you with exceptional heart care, we have created designated Provider Care Teams.  These Care Teams include your primary Cardiologist (physician) and Advanced Practice Providers (APPs -  Physician Assistants and Nurse Practitioners) who all work together to provide you with the care you need, when you need it.  Your next appointment:   As scheduled

## 2024-01-22 NOTE — Progress Notes (Signed)
 Remote ICD transmission.

## 2024-01-23 DIAGNOSIS — H43393 Other vitreous opacities, bilateral: Secondary | ICD-10-CM | POA: Diagnosis not present

## 2024-01-23 DIAGNOSIS — H524 Presbyopia: Secondary | ICD-10-CM | POA: Diagnosis not present

## 2024-01-23 DIAGNOSIS — H52223 Regular astigmatism, bilateral: Secondary | ICD-10-CM | POA: Diagnosis not present

## 2024-01-23 DIAGNOSIS — H25813 Combined forms of age-related cataract, bilateral: Secondary | ICD-10-CM | POA: Diagnosis not present

## 2024-01-23 DIAGNOSIS — H5203 Hypermetropia, bilateral: Secondary | ICD-10-CM | POA: Diagnosis not present

## 2024-01-23 LAB — HM DIABETES EYE EXAM

## 2024-01-27 NOTE — Progress Notes (Unsigned)
 Subjective:  Patient ID: Allison White, female    DOB: 09/01/1961  Age: 63 y.o. MRN: [id number]  Chief Complaint  Patient presents with   Medical Management of Chronic Issues    Discussed the use of AI scribe software for clinical note transcription with the patient, who gave verbal consent to proceed.  History of Present Illness            07/10/2023   11:07 AM 03/27/2023   11:03 AM 01/23/2023   10:38 AM 12/24/2022   11:15 AM 11/29/2021    1:35 PM  Depression screen PHQ 2/9  Decreased Interest 0 2 0 0 0  Down, Depressed, Hopeless 0 0 0 0 0  PHQ - 2 Score 0 2 0 0 0  Altered sleeping 2 2     Tired, decreased energy 3 2     Change in appetite 2 2     Feeling bad or failure about yourself  0 0     Trouble concentrating 0 0     Moving slowly or fidgety/restless 2 1     Suicidal thoughts 0 0     PHQ-9 Score 9 9     Difficult doing work/chores Somewhat difficult Somewhat difficult           07/10/2023   11:06 AM  Fall Risk   Falls in the past year? 1  Number falls in past yr: 0  Injury with Fall? 0  Risk for fall due to : No Fall Risks;History of fall(s)    Patient Care Team: Blane Ohara, MD as PCP - General (Family Medicine) Regan Lemming, MD as PCP - Electrophysiology (Cardiology) Bensimhon, Bevelyn Buckles, MD as PCP - Advanced Heart Failure (Cardiology) Weston Settle, MD as Consulting Physician (Oncology) Lance Bosch, MD as Consulting Physician (Radiation Oncology) Zettie Pho, [facility] (Inactive) (Pharmacist) Julieanne Cotton, MD as Consulting Physician (Interventional Radiology) Weston Settle, MD as Consulting Physician (Oncology) Birdie Sons, OD (Ophthalmology) Misenheimer, Marcial Pacas, MD as Consulting Physician (Unknown Physician Specialty)   Review of Systems  Constitutional:  Negative for chills, fatigue and fever.  HENT:  Positive for rhinorrhea. Negative for congestion, ear pain and sore throat.   Respiratory:  Negative for cough and  shortness of breath.   Cardiovascular:  Negative for chest pain.  Gastrointestinal:  Negative for abdominal pain, constipation, diarrhea, nausea and vomiting.  Genitourinary:  Positive for dysuria (interstitial cystitis flares up.). Negative for urgency.  Musculoskeletal:  Negative for back pain and myalgias.  Neurological:  Negative for dizziness, weakness, light-headedness and headaches.  Psychiatric/Behavioral:  Negative for dysphoric mood. The patient is not nervous/anxious.     Current Outpatient Medications on File Prior to Visit  Medication Sig Dispense Refill   aspirin 81 MG EC tablet Take 81 mg by mouth daily.      dapagliflozin propanediol (FARXIGA) 10 MG TABS tablet Take 1 tablet (10 mg total) by mouth daily. 90 tablet 3   digoxin (LANOXIN) 0.125 MG tablet Take 1 tablet by mouth once daily 90 tablet 3   Evolocumab (REPATHA SURECLICK) 140 MG/ML SOAJ Inject 140 mg into the skin every 14 (fourteen) days. 2 mL 2   ibuprofen (ADVIL) 200 MG tablet Take 200-400 mg by mouth every 6 (six) hours as needed for moderate pain (pain score 4-6).     icosapent Ethyl (VASCEPA) 1 g capsule Take 2 capsules by mouth twice daily 120 capsule 3   loratadine (CLARITIN) 10 MG tablet Take 10 mg by mouth  daily. [facility] brand     metoprolol succinate (TOPROL-XL) 50 MG 24 hr tablet Take 1 tablet by mouth twice daily 180 tablet 3   nitroGLYCERIN (NITROSTAT) 0.4 MG SL tablet Place 0.4 mg under the tongue every 5 (five) minutes as needed for chest pain.     pantoprazole (PROTONIX) 40 MG tablet Take 1 tablet by mouth once daily 90 tablet 0   Phenazopyridine HCl (AZO TABS PO) Take 1 tablet by mouth daily as needed (painful urination).     raloxifene (EVISTA) 60 MG tablet Take 1 tablet by mouth once daily 90 tablet 3   spironolactone (ALDACTONE) 25 MG tablet Take 1 tablet by mouth once daily 90 tablet 0   torsemide (DEMADEX) 100 MG tablet Take 0.5 tablets (50 mg total) by mouth daily. 45 tablet 3   No current  facility-administered medications on file prior to visit.   Past Medical History:  Diagnosis Date   AICD (automatic cardioverter/defibrillator) present 02/25/2017   Biotronik- Dual PPM/AICD   Arthritis    Cancer (HCC) 2022   Rt Breast   CHF (congestive heart failure) (HCC)    Coronary artery disease    Diabetes mellitus without complication (HCC)    Fibromuscular dysplasia (HCC)    FMD (facioscapulohumeral muscular dystrophy) (HCC)    Headache(784.0)    Heart failure (HCC)    Hyperlipidemia    MI (myocardial infarction) (HCC) 11/19/2016   s/p DES LAD   Migraine    Nonruptured cerebral aneurysm, internal carotid artery 07/14/2008   Left ICA stent placed for pseudoaneurysm, likely from dissection related to fibromuscular dysplasia009)   Palpitations    PONV (postoperative nausea and vomiting)    Pseudoaneurysm (HCC)    both carotids    Stroke (HCC) 06/10/2008   when carotid artery dissected, she was told she had a stroke (pt states she has had dissection on both sides); s/p left ICA stent 07/14/08   Vertigo    Past Surgical History:  Procedure Laterality Date   ABDOMINAL HYSTERECTOMY  08/16/2004   Uterus and Cervix (per pathology)   BAROREFLEX SYSTEM INSERTION  11/14/2023   BREAST LUMPECTOMY Right 07/2021   CARDIAC DEFIBRILLATOR PLACEMENT     CEREBRAL ANEURYSM REPAIR Left 2009   CESAREAN SECTION     CORONARY ANGIOPLASTY WITH STENT PLACEMENT     RIGHT/LEFT HEART CATH AND CORONARY ANGIOGRAPHY N/A 04/07/2020   Procedure: RIGHT/LEFT HEART CATH AND CORONARY ANGIOGRAPHY;  Surgeon: Dolores Patty, MD;  Location: [facility];  Service: Cardiovascular;  Laterality: N/A;   TUBAL LIGATION      Family History  Problem Relation Age of Onset   Thyroid disease Mother    Gastric cancer Mother 16       w/ signet ring features   Prostate cancer Father        d. 13   Hypertension Brother    Breast cancer Maternal Aunt 39   Brain cancer Maternal Aunt 10   Prostate cancer  Paternal Uncle        x2 pat uncles   Prostate cancer Maternal Grandfather        metastatic; d. 65   Cerebral aneurysm Paternal Grandmother        Nonruptured   Dementia Paternal Grandfather    Prostate cancer Paternal Grandfather        metastatic   Social History   Socioeconomic History   Marital status: Married    Spouse name: Tim   Number of children: 4   Years  of education: Not on file   Highest education level: High school graduate  Occupational History   Not on file  Tobacco Use   Smoking status: Former    Current packs/day: 0.00    Average packs/day: 1 pack/day for 6.0 years (6.0 ttl pk-yrs)    Types: Cigarettes    Quit date: 19    Years since quitting: 42.2   Smokeless tobacco: Never   Tobacco comments:    Quit over 30 years ago  Vaping Use   Vaping status: Never Used  Substance and Sexual Activity   Alcohol use: Not Currently   Drug use: No   Sexual activity: Not Currently  Other Topics Concern   Not on file  Social History Narrative   Lives with husband in a one-story home.     Right handed   Caffeine: "very little"   Social Drivers of Corporate investment banker Strain: Low Risk  (03/05/2023)   Overall Financial Resource Strain (CARDIA)    Difficulty of Paying Living Expenses: Not hard at all  Recent Concern: Financial Resource Strain - Medium Risk (01/23/2023)   Overall Financial Resource Strain (CARDIA)    Difficulty of Paying Living Expenses: Somewhat hard  Food Insecurity: No Food Insecurity (01/28/2024)   Hunger Vital Sign    Worried About Running Out of Food in the Last Year: Never true    Ran Out of Food in the Last Year: Never true  Transportation Needs: No Transportation Needs (01/28/2024)   PRAPARE - Administrator, Civil Service (Medical): No    Lack of Transportation (Non-Medical): No  Physical Activity: Sufficiently Active (01/23/2023)   Exercise Vital Sign    Days of Exercise per Week: 4 days    Minutes of Exercise per  Session: 90 min  Stress: No Stress Concern Present (01/23/2023)   [employer] of Occupational Health - Occupational Stress Questionnaire    Feeling of Stress : Not at all  Social Connections: Moderately Integrated (01/28/2024)   Social Connection and Isolation Panel [NHANES]    Frequency of Communication with Friends and Family: More than three times a week    Frequency of Social Gatherings with Friends and Family: More than three times a week    Attends Religious Services: More than 4 times per year    Active Member of [employer] or Organizations: No    Attends Engineer, structural: Never    Marital Status: Married    Objective:  BP 110/80   Pulse 71   Temp 97.8 F (36.6 C)   Ht 5\' 5"  (1.651 m)   Wt 203 lb (92.1 kg)   SpO2 97%   BMI 33.78 kg/m      01/28/2024    9:38 AM 01/06/2024    8:59 AM 12/22/2023    2:20 PM  BP/Weight  Systolic BP 110 102 108  Diastolic BP 80 60 60  Wt. (Lbs) 203 207 203.4  BMI 33.78 kg/m2 34.45 kg/m2 33.85 kg/m2    Physical Exam  Diabetic Foot Exam - Simple   No data filed      Lab Results  Component Value Date   WBC 7.8 01/28/2024   HGB 17.3 (H) 01/28/2024   HCT 49.8 (H) 01/28/2024   PLT 280 01/28/2024   GLUCOSE 124 (H) 01/28/2024   CHOL 160 01/28/2024   TRIG 291 (H) 01/28/2024   HDL 48 01/28/2024   LDLCALC 66 01/28/2024   ALT 20 01/28/2024   AST 19 01/28/2024  NA 142 01/28/2024   K 4.1 01/28/2024   CL 97 01/28/2024   CREATININE 0.98 01/28/2024   BUN 16 01/28/2024   CO2 27 01/28/2024   TSH 3.720 12/24/2022   INR 1.1 11/07/2023   HGBA1C 6.6 (H) 01/28/2024      Assessment & Plan:  Assessment and Plan            Controlled type 2 diabetes mellitus without complication, without long-term current use of insulin (HCC) -     CBC with Differential/Platelet -     Hemoglobin A1c -     Microalbumin / creatinine urine ratio  Mixed hyperlipidemia Assessment & Plan: Well controlled.  No changes to medicines.   Continue to work on eating a healthy diet and exercise.  Labs drawn today.    Orders: -     Comprehensive metabolic panel -     Lipid panel  Encounter for immunization -     Pneumococcal conjugate vaccine 20-valent  Chronic systolic congestive heart failure (HCC)  Gastroesophageal reflux disease without esophagitis  Other orders -     HYDROcodone-Acetaminophen; Take 1 tablet by mouth every 6 (six) hours as needed for moderate pain (pain score 4-6).  Dispense: 30 tablet; Refill: 0     Meds ordered this encounter  Medications   HYDROcodone-acetaminophen (NORCO) 7.5-325 MG tablet    Sig: Take 1 tablet by mouth every 6 (six) hours as needed for moderate pain (pain score 4-6).    Dispense:  30 tablet    Refill:  0    Orders Placed This Encounter  Procedures   Pneumococcal conjugate vaccine 20-valent   CBC with Differential/Platelet   Comprehensive metabolic panel   Lipid panel   Hemoglobin A1c   Microalbumin / creatinine urine ratio   HM DIABETES EYE EXAM     Follow-up: Return in about 3 months (around 04/29/2024) for chronic follow up, awv due. please schedule with Selena Batten. Sherrill Raring Destry Bezdek,acting as a scribe for Blane Ohara, MD.,have documented all relevant documentation on the behalf of Blane Ohara, MD,as directed by  Blane Ohara, MD while in the presence of Blane Ohara, MD.   An After Visit Summary was printed and given to the patient.  Blane Ohara, MD [facility] [phone]

## 2024-01-28 ENCOUNTER — Ambulatory Visit: Payer: PPO | Admitting: Family Medicine

## 2024-01-28 ENCOUNTER — Encounter: Payer: Self-pay | Admitting: Family Medicine

## 2024-01-28 VITALS — BP 110/80 | HR 71 | Temp 97.8°F | Ht 65.0 in | Wt 203.0 lb

## 2024-01-28 DIAGNOSIS — K219 Gastro-esophageal reflux disease without esophagitis: Secondary | ICD-10-CM | POA: Diagnosis not present

## 2024-01-28 DIAGNOSIS — E119 Type 2 diabetes mellitus without complications: Secondary | ICD-10-CM

## 2024-01-28 DIAGNOSIS — I25119 Atherosclerotic heart disease of native coronary artery with unspecified angina pectoris: Secondary | ICD-10-CM

## 2024-01-28 DIAGNOSIS — E66811 Obesity, class 1: Secondary | ICD-10-CM | POA: Diagnosis not present

## 2024-01-28 DIAGNOSIS — Z23 Encounter for immunization: Secondary | ICD-10-CM

## 2024-01-28 DIAGNOSIS — E6609 Other obesity due to excess calories: Secondary | ICD-10-CM | POA: Diagnosis not present

## 2024-01-28 DIAGNOSIS — T466X5A Adverse effect of antihyperlipidemic and antiarteriosclerotic drugs, initial encounter: Secondary | ICD-10-CM

## 2024-01-28 DIAGNOSIS — I5022 Chronic systolic (congestive) heart failure: Secondary | ICD-10-CM

## 2024-01-28 DIAGNOSIS — N3010 Interstitial cystitis (chronic) without hematuria: Secondary | ICD-10-CM | POA: Diagnosis not present

## 2024-01-28 DIAGNOSIS — M791 Myalgia, unspecified site: Secondary | ICD-10-CM | POA: Diagnosis not present

## 2024-01-28 DIAGNOSIS — E782 Mixed hyperlipidemia: Secondary | ICD-10-CM

## 2024-01-28 DIAGNOSIS — Z6833 Body mass index (BMI) 33.0-33.9, adult: Secondary | ICD-10-CM | POA: Diagnosis not present

## 2024-01-28 MED ORDER — HYDROCODONE-ACETAMINOPHEN 7.5-325 MG PO TABS: 1.0000 | ORAL_TABLET | Freq: Four times a day (QID) | ORAL | 0 refills | Status: DC | PRN

## 2024-01-28 NOTE — Patient Instructions (Signed)
 VISIT SUMMARY:  You came in today for a routine follow-up. We discussed your recent baroreceptor stimulator implantation and its effects, as well as your ongoing management for heart failure, diabetes, and interstitial cystitis. We also reviewed your medications and general health maintenance.  YOUR PLAN:  -BARORECEPTOR ACTIVATION THERAPY:  Defer management to cardiology.  -HEART FAILURE: Heart failure means your heart doesn't pump blood as well as it should. Your condition is being managed with your current medications, and you have reported no recent issues. Please continue taking your heart failure medications as prescribed.  -TYPE 2 DIABETES MELLITUS: Type 2 diabetes is a condition where your body doesn't use insulin properly, leading to high blood sugar levels. Your diabetes is well-managed with Farxiga 10 mg daily, and your A1c levels are stable. You do not need to check your blood sugar regularly. We will order a urine protein creatinine ratio to monitor your kidney function.  -HYPERLIPIDEMIA: Hyperlipidemia means you have high levels of fats (lipids) in your blood. You are managing this condition with Repatha. Please continue taking Repatha as prescribed.  -GASTROESOPHAGEAL REFLUX DISEASE (GERD): GERD is a condition where stomach acid frequently flows back into the tube connecting your mouth and stomach, causing discomfort. Your GERD is controlled with pantoprazole. Please continue taking pantoprazole as prescribed.  -INTERSTITIAL CYSTITIS: Interstitial cystitis is a chronic condition causing bladder pressure and pain. You are using AZO as needed for symptom relief. Please continue using AZO as needed.  -GENERAL HEALTH MAINTENANCE: You are due for some vaccinations and a wellness visit. I recommend getting the shingles and tetanus vaccines at the pharmacy. Please schedule your Medicare annual wellness visit. We will also obtain a urine sample to check your protein creatinine  ratio.  INSTRUCTIONS:  Please continue with your current medications and follow the recommendations provided. Schedule your Medicare annual wellness visit and get the recommended vaccines at the pharmacy. We will also need a urine sample to check your protein creatinine ratio. If your headaches persist, please let us know so we can discuss further management options.

## 2024-01-28 NOTE — Progress Notes (Deleted)
  Electrophysiology Office Note:   ID:  Jaimey Franchini, DOB 16-Jul-1961, MRN [id number]  Primary Cardiologist: None Electrophysiologist: Will Jorja Loa, MD      History of Present Illness:   Allison White is a 63 y.o. female with h/o CAD (STEMI 11/19/2016 that is post drug-eluting stent to the LAD and diagonal bifurcation), ICM, chronic CHF, ICD, breast ca (lumpectomy x2 and radiation of her left breast), HLD, HTN, PVD (carotid artery dissection with stenting of left carotid due to possible FMD) seen today for post hospital follow up.    Admitted 11/14/2023 for planned insertion of Barostim.   At prior visit titrated device titrated from 2.0 millamp to 3.4 milliamp.   Since last visit ***  Since last visit pt has been feeling well. She felt better functionally at higher programmed output, but stim caused persistent HAs. At lower programming she has felt less functional (but still better than prior baseline). Continues to have some lack of movement in her R lower lip. Having good and great days, now. Goals remain to be able to work outside this spring and increase her functional status overall.   Review of systems complete and found to be negative unless listed in HPI.    EP Information / Studies Reviewed:    EKG is not ordered today.  EKG 11/13/2023 previously reviewed.       ICD Interrogation-  reviewed in detail today,  See PACEART report.  Arrhythmia/Device History Biotronik dual chamber ICD implanted 02/25/2017  Barostim (commercial) 11/14/2023  HF MD-Dr Bensimhon   Echo 01/09/23: EF 20-25% RV ok G2 DD mod MR   Physical Exam:   VS:  There were no vitals taken for this visit.   Wt Readings from Last 3 Encounters:  01/28/24 203 lb (92.1 kg)  01/06/24 207 lb (93.9 kg)  12/22/23 203 lb 6.4 oz (92.3 kg)     GEN: Well nourished, well developed in no acute distress NECK: No JVD; No carotid bruits CARDIAC: {EPRHYTHM:28826}, no murmurs, rubs, gallops RESPIRATORY:  Clear to  auscultation without rales, wheezing or rhonchi  ABDOMEN: Soft, non-tender, non-distended EXTREMITIES:  No edema; No deformity    ASSESSMENT AND PLAN:    Chronic systolic CHF s/p Biotronik and Barostim implantation ICM NYHA *** symptoms.  Device titrated from 3.4 millamp to *** milliamp by increments of 0.4 with good blood pressure response.  *** stim at 4.8 that improved significantly but remained at 3.8. Described as tooth pain in the back of her jaw Device impedence stable.  Pt goals are to increase functional status, play with grandkids, and work outside this spring.  Normal device function See scanned report. Denies ischemic symptoms  Disposition:   Follow up with EP APP in ***  Signed, Graciella Freer, PA-C

## 2024-01-29 ENCOUNTER — Other Ambulatory Visit (HOSPITAL_COMMUNITY): Payer: Self-pay

## 2024-01-29 ENCOUNTER — Encounter: Payer: Self-pay | Admitting: Family Medicine

## 2024-01-29 ENCOUNTER — Ambulatory Visit: Payer: PPO | Admitting: Student

## 2024-01-29 ENCOUNTER — Other Ambulatory Visit (HOSPITAL_COMMUNITY): Payer: Self-pay | Admitting: Internal Medicine

## 2024-01-29 DIAGNOSIS — I5022 Chronic systolic (congestive) heart failure: Secondary | ICD-10-CM

## 2024-01-29 LAB — CBC WITH DIFFERENTIAL/PLATELET
Basophils Absolute: 0.1 10*3/uL (ref 0.0–0.2)
Basos: 1 %
EOS (ABSOLUTE): 0.2 10*3/uL (ref 0.0–0.4)
Eos: 3 %
Hematocrit: 49.8 % — ABNORMAL HIGH (ref 34.0–46.6)
Hemoglobin: 17.3 g/dL — ABNORMAL HIGH (ref 11.1–15.9)
Immature Grans (Abs): 0 10*3/uL (ref 0.0–0.1)
Immature Granulocytes: 0 %
Lymphocytes Absolute: 2.2 10*3/uL (ref 0.7–3.1)
Lymphs: 28 %
MCH: 32.4 pg (ref 26.6–33.0)
MCHC: 34.7 g/dL (ref 31.5–35.7)
MCV: 93 fL (ref 79–97)
Monocytes Absolute: 0.8 10*3/uL (ref 0.1–0.9)
Monocytes: 10 %
Neutrophils Absolute: 4.6 10*3/uL (ref 1.4–7.0)
Neutrophils: 58 %
Platelets: 280 10*3/uL (ref 150–450)
RBC: 5.34 x10E6/uL — ABNORMAL HIGH (ref 3.77–5.28)
RDW: 12.4 % (ref 11.7–15.4)
WBC: 7.8 10*3/uL (ref 3.4–10.8)

## 2024-01-29 LAB — LIPID PANEL
Chol/HDL Ratio: 3.3 ratio (ref 0.0–4.4)
Cholesterol, Total: 160 mg/dL (ref 100–199)
HDL: 48 mg/dL (ref >39)
LDL Chol Calc (NIH): 66 mg/dL (ref 0–99)
Triglycerides: 291 mg/dL — ABNORMAL HIGH (ref 0–149)
VLDL Cholesterol Cal: 46 mg/dL — ABNORMAL HIGH (ref 5–40)

## 2024-01-29 LAB — COMPREHENSIVE METABOLIC PANEL
ALT: 20 IU/L (ref 0–32)
AST: 19 IU/L (ref 0–40)
Albumin: 4.5 g/dL (ref 3.9–4.9)
Alkaline Phosphatase: 100 IU/L (ref 44–121)
BUN/Creatinine Ratio: 16 (ref 12–28)
BUN: 16 mg/dL (ref 8–27)
Bilirubin Total: 0.5 mg/dL (ref 0.0–1.2)
CO2: 27 mmol/L (ref 20–29)
Calcium: 10.5 mg/dL — ABNORMAL HIGH (ref 8.7–10.3)
Chloride: 97 mmol/L (ref 96–106)
Creatinine, Ser: 0.98 mg/dL (ref 0.57–1.00)
Globulin, Total: 2.5 g/dL (ref 1.5–4.5)
Glucose: 124 mg/dL — ABNORMAL HIGH (ref 70–99)
Potassium: 4.1 mmol/L (ref 3.5–5.2)
Sodium: 142 mmol/L (ref 134–144)
Total Protein: 7 g/dL (ref 6.0–8.5)
eGFR: 65 mL/min/{1.73_m2} (ref >59)

## 2024-01-29 LAB — MICROALBUMIN / CREATININE URINE RATIO
Creatinine, Urine: 96.5 mg/dL
Microalb/Creat Ratio: 9 mg/g{creat} (ref 0–29)
Microalbumin, Urine: 9 ug/mL

## 2024-01-29 LAB — HEMOGLOBIN A1C
Est. average glucose Bld gHb Est-mCnc: 143 mg/dL
Hgb A1c MFr Bld: 6.6 % — ABNORMAL HIGH (ref 4.8–5.6)

## 2024-01-29 MED ORDER — IVABRADINE HCL 7.5 MG PO TABS
7.5000 mg | ORAL_TABLET | Freq: Two times a day (BID) | ORAL | 3 refills | Status: AC
  Filled 2024-01-29 – 2024-02-07 (×2): qty 180, 90d supply, fill #0
  Filled 2024-04-22 – 2024-04-30 (×2): qty 180, 90d supply, fill #1
  Filled 2024-07-20: qty 180, 90d supply, fill #2
  Filled 2024-10-25: qty 180, 90d supply, fill #3

## 2024-02-01 NOTE — Assessment & Plan Note (Signed)
Well controlled.  ?No changes to medicines.  ?Continue to work on eating a healthy diet and exercise.  ?Labs drawn today.  ?

## 2024-02-02 DIAGNOSIS — E66811 Obesity, class 1: Secondary | ICD-10-CM | POA: Insufficient documentation

## 2024-02-02 DIAGNOSIS — N3010 Interstitial cystitis (chronic) without hematuria: Secondary | ICD-10-CM | POA: Insufficient documentation

## 2024-02-02 NOTE — Assessment & Plan Note (Signed)
 Recommend continue to work on eating healthy diet and exercise. Comorbidities: hyperlipidemia.

## 2024-02-02 NOTE — Assessment & Plan Note (Signed)
 Improved symptoms with Pantoprazole. No recent episodes of severe diarrhea. -Continue Pantoprazole.

## 2024-02-02 NOTE — Assessment & Plan Note (Signed)
 The current medical regimen is effective;  continue present plan and medications. Continue farxiga, digoxin, corlanor, toprol xl, spironolactone, and torsemide.

## 2024-02-02 NOTE — Assessment & Plan Note (Signed)
 Managed with Farxiga 10 mg daily. Stable A1c levels. No regular blood sugar checks required. - Continue Farxiga 10 mg daily. - Order urine protein creatinine ratio.

## 2024-02-02 NOTE — Assessment & Plan Note (Signed)
 Continue AZO.

## 2024-02-02 NOTE — Assessment & Plan Note (Signed)
 Intolerant to statin.

## 2024-02-02 NOTE — Assessment & Plan Note (Signed)
 Continue aspirin 81 mg daily. Continue repatha 140 mg IM every 2 weeks.  Continue zetia 10 mg daily, toprol xl 50 mg twice daily

## 2024-02-07 ENCOUNTER — Other Ambulatory Visit (HOSPITAL_COMMUNITY): Payer: Self-pay

## 2024-02-09 ENCOUNTER — Other Ambulatory Visit: Payer: Self-pay

## 2024-02-10 ENCOUNTER — Ambulatory Visit

## 2024-02-10 ENCOUNTER — Other Ambulatory Visit: Payer: Self-pay

## 2024-02-13 ENCOUNTER — Encounter: Admitting: Pulmonary Disease

## 2024-02-15 NOTE — Progress Notes (Unsigned)
  Electrophysiology Office Note:   ID:  Allison White, DOB 1961/08/28, MRN [id number]  Primary Cardiologist: None Electrophysiologist: Will Jorja Loa, MD      History of Present Illness:   Allison White is a 63 y.o. female with h/o CAD (STEMI 11/19/2016 that is post drug-eluting stent to the LAD and diagonal bifurcation), ICM, chronic CHF, ICD, breast ca (lumpectomy x2 and radiation of her left breast), HLD, HTN, PVD (carotid artery dissection with stenting of left carotid due to possible FMD) seen today for post hospital follow up.    Admitted 11/14/2023 for planned insertion of Barostim.   At prior visit titrated device titrated from 2.0 millamp to 3.4 milliamp.   Since last visit she reports doing OK. More better days, but still some days with lots of fatigue. Has a HA today, and she does notice each impedance check.  Still lacking some movement in her R lower lip. Goals remain to be able to work outside this spring and increase her functional status overall.   Review of systems complete and found to be negative unless listed in HPI.    EP Information / Studies Reviewed:    EKG is not ordered today.  EKG 11/13/2023 previously reviewed.       ICD Interrogation-  reviewed in detail today,  See PACEART report.  Arrhythmia/Device History Biotronik dual chamber ICD implanted 02/25/2017  Barostim (commercial) 11/14/2023  HF MD-Dr Bensimhon   Echo 01/09/23: EF 20-25% RV ok G2 DD mod MR   Physical Exam:   VS:  BP (!) 150/98 (BP Location: Right Arm, Patient Position: Sitting, Cuff Size: Large)   Pulse 77   Resp 16   Ht 5\' 5"  (1.651 m)   Wt 199 lb 6.4 oz (90.4 kg)   SpO2 97%   BMI 33.18 kg/m    Wt Readings from Last 3 Encounters:  02/16/24 199 lb 6.4 oz (90.4 kg)  01/28/24 203 lb (92.1 kg)  01/06/24 207 lb (93.9 kg)     GEN: Well nourished, well developed in no acute distress NECK: No JVD; No carotid bruits CARDIAC: Regular rate and rhythm, no murmurs, rubs,  gallops RESPIRATORY:  Clear to auscultation without rales, wheezing or rhonchi  ABDOMEN: Soft, non-tender, non-distended EXTREMITIES:  No edema; No deformity    ASSESSMENT AND PLAN:    Chronic systolic CHF s/p Biotronik and Barostim implantation ICM NYHA II-III symptoms.  Device titrated from 3.4 millamp to 4.4 milliamp by increments of 0.4 with good blood pressure response.  Had stim again at 5.8 Described as tooth pain in the back of her jaw Device impedence stable.  Pt goals are to increase functional status, play with grandkids, and work outside this spring.  Normal device function See scanned report. Denies ischemic symptoms  Disposition:   Follow up with EP APP in 2 months  Signed, Graciella Freer, PA-C

## 2024-02-16 ENCOUNTER — Encounter: Payer: Self-pay | Admitting: Student

## 2024-02-16 ENCOUNTER — Ambulatory Visit: Attending: Student | Admitting: Student

## 2024-02-16 VITALS — BP 150/98 | HR 77 | Resp 16 | Ht 65.0 in | Wt 199.4 lb

## 2024-02-16 DIAGNOSIS — I2510 Atherosclerotic heart disease of native coronary artery without angina pectoris: Secondary | ICD-10-CM | POA: Diagnosis not present

## 2024-02-16 DIAGNOSIS — I255 Ischemic cardiomyopathy: Secondary | ICD-10-CM

## 2024-02-16 DIAGNOSIS — I5022 Chronic systolic (congestive) heart failure: Secondary | ICD-10-CM

## 2024-02-16 NOTE — Patient Instructions (Signed)
 Medication Instructions:  Your physician recommends that you continue on your current medications as directed. Please refer to the Current Medication list given to you today.  *If you need a refill on your cardiac medications before your next appointment, please call your pharmacy*  Lab Work: None ordered If you have labs (blood work) drawn today and your tests are completely normal, you will receive your results only by: MyChart Message (if you have MyChart) OR A paper copy in the mail If you have any lab test that is abnormal or we need to change your treatment, we will call you to review the results.  Follow-Up: At [facility], you and your health needs are our priority.  As part of our continuing mission to provide you with exceptional heart care, our providers are all part of one team.  This team includes your primary Cardiologist (physician) and Advanced Practice Providers or APPs (Physician Assistants and Nurse Practitioners) who all work together to provide you with the care you need, when you need it.  Your next appointment:   As scheduled    1st Floor: - Lobby - Registration  - Pharmacy  - Lab - Cafe  2nd Floor: - PV Lab - Diagnostic Testing (echo, CT, nuclear med)  3rd Floor: - Vacant  4th Floor: - TCTS (cardiothoracic surgery) - AFib Clinic - Structural Heart Clinic - Vascular Surgery  - Vascular Ultrasound  5th Floor: - HeartCare Cardiology (general and EP) - Clinical Pharmacy for coumadin, hypertension, lipid, weight-loss medications, and med management appointments    Valet parking services will be available as well.

## 2024-02-23 NOTE — Progress Notes (Unsigned)
 [facility]  [address] [place],  [place]  [address] [phone]  Clinic Day:  02/24/2024  Referring physician: Blane Ohara, MD  HISTORY OF PRESENT ILLNESS:  The patient is a 63 y.o. 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  She comes in today for routine follow-up.  Since her last visit, the patient has been doing fine.  Of note, she takes raloxifene for chemoprevention as tamoxifen caused significant nausea.  The patient denies having any particular changes in her breasts which concern her for disease recurrence.    She is also followed for polycythemia.  Past JAK2 mutation testing came back negative.  Of note, the patient is taking Farxiga, spironolactone, and torsemide-all of which can potentiate a secondary polycythemia.  PHYSICAL EXAM:  Blood pressure 134/62, pulse 69, temperature 97.8 F (36.6 C), temperature source Oral, resp. rate 16, height 5\' 5"  (1.651 m), weight 201 lb 6.4 oz (91.4 kg), SpO2 98%. Wt Readings from Last 3 Encounters:  02/24/24 201 lb 6.4 oz (91.4 kg)  02/16/24 199 lb 6.4 oz (90.4 kg)  01/28/24 203 lb (92.1 kg)   Body mass index is 33.51 kg/m. Performance status (ECOG): 0 - Asymptomatic Physical Exam Constitutional:      Appearance: Normal appearance.  HENT:     Mouth/Throat:     Pharynx: Oropharynx is clear. No oropharyngeal exudate.  Cardiovascular:     Rate and Rhythm: Normal rate and regular rhythm.     Heart sounds: No murmur heard.    No friction rub. No gallop.  Pulmonary:     Breath sounds: Normal breath sounds.  Chest:  Breasts:    Right: No swelling, bleeding, inverted nipple, mass, nipple discharge or skin change.     Left: No swelling, bleeding, inverted nipple, mass, nipple discharge or skin change.  Abdominal:     General: Bowel sounds are normal. There is no distension.     Palpations: Abdomen is soft. There is no mass.      Tenderness: There is no abdominal tenderness.  Musculoskeletal:        General: No tenderness.     Cervical back: Normal range of motion and neck supple.     Right lower leg: No edema.     Left lower leg: No edema.  Lymphadenopathy:     Cervical: No cervical adenopathy.     Right cervical: No superficial, deep or posterior cervical adenopathy.    Left cervical: No superficial, deep or posterior cervical adenopathy.     Upper Body:     Right upper body: No supraclavicular or axillary adenopathy.     Left upper body: No supraclavicular or axillary adenopathy.     Lower Body: No right inguinal adenopathy. No left inguinal adenopathy.  Skin:    Coloration: Skin is not jaundiced.     Findings: No lesion or rash.  Neurological:     General: No focal deficit present.     Mental Status: She is alert and oriented to person, place, and time. Mental status is at baseline.  Psychiatric:        Mood and Affect: Mood normal.        Behavior: Behavior normal.        Thought Content: Thought content normal.        Judgment: Judgment normal.    LABS:    Latest Reference Range & Units 01/28/24 11:02  WBC 3.4 -  10.8 x10E3/uL 7.8  RBC 3.77 - 5.28 x10E6/uL 5.34 (H)  Hemoglobin 11.1 - 15.9 g/dL 16.1 (H)  HCT 09.6 - 04.5 % 49.8 (H)  MCV 79 - 97 fL 93  MCH 26.6 - 33.0 pg 32.4  MCHC 31.5 - 35.7 g/dL 40.9  RDW 81.1 - 91.4 % 12.4  Platelets 150 - 450 x10E3/uL 280  (H): Data is abnormally high  ASSESSMENT & PLAN:  A 63 y.o. 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   Based upon her clinical breast exam today, the patient remains disease-free.  She knows to continue taking raloxifene for her 5 years of chemoprevention.  With respect to her secondary polycythemia, her hemoglobin has been higher lately.  I do believe this is related to her medications, which include Farxiga, torsemide, and spironolactone.  Ultimately, if her  hemoglobin rises above 18, she may need to talk to her cardiologist and primary care provider about which medicines can either be decreased or stopped to prevent worsening polycythemia over time.  Otherwise, as the patient is doing well, I will see her back in 6 months for repeat clinical assessment.  Her annual mammogram has already been scheduled before her next visit for her continued radiographic breast cancer surveillance.  The patient understands all the plans discussed today and is in agreement with them.  Kambri Dismore Kirby Funk, MD

## 2024-02-24 ENCOUNTER — Telehealth: Payer: Self-pay | Admitting: Oncology

## 2024-02-24 ENCOUNTER — Other Ambulatory Visit: Payer: Self-pay | Admitting: Oncology

## 2024-02-24 ENCOUNTER — Ambulatory Visit: Payer: PPO | Admitting: Oncology

## 2024-02-24 ENCOUNTER — Inpatient Hospital Stay: Attending: Oncology | Admitting: Oncology

## 2024-02-24 VITALS — BP 134/62 | HR 69 | Temp 97.8°F | Resp 16 | Ht 65.0 in | Wt 201.4 lb

## 2024-02-24 DIAGNOSIS — D0511 Intraductal carcinoma in situ of right breast: Secondary | ICD-10-CM | POA: Insufficient documentation

## 2024-02-24 DIAGNOSIS — D751 Secondary polycythemia: Secondary | ICD-10-CM | POA: Diagnosis not present

## 2024-02-24 DIAGNOSIS — Z170 Estrogen receptor positive status [ER+]: Secondary | ICD-10-CM

## 2024-02-24 DIAGNOSIS — Z79810 Long term (current) use of selective estrogen receptor modulators (SERMs): Secondary | ICD-10-CM | POA: Diagnosis not present

## 2024-02-24 DIAGNOSIS — C50411 Malignant neoplasm of upper-outer quadrant of right female breast: Secondary | ICD-10-CM

## 2024-02-24 NOTE — Telephone Encounter (Signed)
 Patient has been scheduled for follow-up visit per 02/24/24 LOS.  Pt given an appt calendar with date and time.

## 2024-02-27 ENCOUNTER — Other Ambulatory Visit: Payer: Self-pay | Admitting: Family Medicine

## 2024-02-27 MED ORDER — HYDROCODONE-ACETAMINOPHEN 7.5-325 MG PO TABS: 1.0000 | ORAL_TABLET | Freq: Four times a day (QID) | ORAL | 0 refills | Status: DC | PRN

## 2024-03-01 ENCOUNTER — Other Ambulatory Visit: Payer: Self-pay | Admitting: Family Medicine

## 2024-03-03 MED ORDER — SPIRONOLACTONE 25 MG PO TABS: 25.0000 mg | ORAL_TABLET | Freq: Every day | ORAL | 0 refills | Status: DC

## 2024-03-09 ENCOUNTER — Other Ambulatory Visit: Payer: Self-pay | Admitting: Family Medicine

## 2024-03-09 DIAGNOSIS — I25119 Atherosclerotic heart disease of native coronary artery with unspecified angina pectoris: Secondary | ICD-10-CM

## 2024-03-11 ENCOUNTER — Ambulatory Visit (INDEPENDENT_AMBULATORY_CARE_PROVIDER_SITE_OTHER): Payer: HMO

## 2024-03-11 DIAGNOSIS — I255 Ischemic cardiomyopathy: Secondary | ICD-10-CM | POA: Diagnosis not present

## 2024-03-11 LAB — CUP PACEART REMOTE DEVICE CHECK
Battery Voltage: 32
Date Time Interrogation Session: 20250424083112
Implantable Lead Connection Status: 753985
Implantable Lead Connection Status: 753985
Implantable Lead Implant Date: 20180410
Implantable Lead Implant Date: 20180410
Implantable Lead Location: 753859
Implantable Lead Location: 753860
Implantable Lead Model: 377
Implantable Lead Model: 402266
Implantable Lead Serial Number: 49794726
Implantable Lead Serial Number: 49838890
Implantable Pulse Generator Implant Date: 20180410
Pulse Gen Model: 404622
Pulse Gen Serial Number: 60982098

## 2024-03-18 ENCOUNTER — Ambulatory Visit

## 2024-03-18 VITALS — Ht 65.0 in | Wt 201.0 lb

## 2024-03-18 DIAGNOSIS — Z0000 Encounter for general adult medical examination without abnormal findings: Secondary | ICD-10-CM | POA: Diagnosis not present

## 2024-03-18 NOTE — Patient Instructions (Signed)
 Allison White , Thank you for taking time to come for your Medicare Wellness Visit. I appreciate your ongoing commitment to your health goals. Please review the following plan we discussed and let me know if I can assist you in the future.   Referrals/Orders/Follow-Ups/Clinician Recommendations: Aim for 30 minutes of exercise or brisk walking, 6-8 glasses of water, and 5 servings of fruits and vegetables each day.  This is a list of the screening recommended for you and due dates:  Health Maintenance  Topic Date Due   DTaP/Tdap/Td vaccine (1 - Tdap) Never done   Zoster (Shingles) Vaccine (1 of 2) Never done   COVID-19 Vaccine (4 - Pfizer risk 2024-25 season) 04/26/2024   Flu Shot  06/18/2024   Hemoglobin A1C  07/30/2024   Complete foot exam   10/26/2024   Eye exam for diabetics  01/22/2025   Yearly kidney function blood test for diabetes  01/27/2025   Yearly kidney health urinalysis for diabetes  01/27/2025   Medicare Annual Wellness Visit  03/18/2025   Mammogram  06/10/2025   Colon Cancer Screening  10/02/2027   Pneumococcal Vaccination  Completed   HPV Vaccine  Aged Out   Meningitis B Vaccine  Aged Out   Hepatitis C Screening  Discontinued   Cologuard (Stool DNA test)  Discontinued    Advanced directives: (ACP Link)Information on Advanced Care Planning can be found at [employer] [facility] Advance Health Care Directives Advance Health Care Directives. [email or web address]   Next Medicare Annual Wellness Visit scheduled for next year: Yes

## 2024-03-18 NOTE — Progress Notes (Signed)
 Subjective:   Allison White is a 63 y.o. who presents for a Medicare Wellness preventive visit.  Visit Complete: Virtual I connected with  Allison White on 03/18/24 by a audio enabled telemedicine application and verified that I am speaking with the correct person using two identifiers.  Patient Location: Home  Provider Location: Home Office  I discussed the limitations of evaluation and management by telemedicine. The patient expressed understanding and agreed to proceed.  Vital Signs: Because this visit was a virtual/telehealth visit, some criteria may be missing or patient reported. Any vitals not documented were not able to be obtained and vitals that have been documented are patient reported.  VideoDeclined- This patient declined Librarian, academic. Therefore the visit was completed with audio only.  Persons Participating in Visit: Patient.  AWV Questionnaire: No: Patient Medicare AWV questionnaire was not completed prior to this visit.  Cardiac Risk Factors include: diabetes mellitus;dyslipidemia;hypertension     Objective:    Today's Vitals   03/18/24 1001  Weight: 201 lb (91.2 kg)  Height: 5\' 5"  (1.651 m)   Body mass index is 33.45 kg/m.     03/18/2024   10:59 AM 11/14/2023   11:31 AM 11/07/2023   11:51 AM 08/26/2023    2:35 PM 02/24/2023    4:53 PM 08/22/2022    2:23 PM 02/19/2022    2:12 PM  Advanced Directives  Does Patient Have a Medical Advance Directive? No No No No No No No  Would patient like information on creating a medical advance directive? Yes (MAU/Ambulatory/Procedural Areas - Information given) No - Patient declined No - Patient declined        Current Medications (verified) Outpatient Encounter Medications as of 03/18/2024  Medication Sig   aspirin 81 MG EC tablet Take 81 mg by mouth daily.    dapagliflozin  propanediol (FARXIGA ) 10 MG TABS tablet Take 1 tablet (10 mg total) by mouth daily.   digoxin  (LANOXIN ) 0.125 MG  tablet Take 1 tablet by mouth once daily   Evolocumab  (REPATHA  SURECLICK) 140 MG/ML SOAJ INJECT 140MG  INTO THE SKIN EVERY 14 DAYS   HYDROcodone -acetaminophen  (NORCO) 7.5-325 MG tablet Take 1 tablet by mouth every 6 (six) hours as needed for moderate pain (pain score 4-6).   ibuprofen (ADVIL) 200 MG tablet Take 200-400 mg by mouth every 6 (six) hours as needed for moderate pain (pain score 4-6).   icosapent  Ethyl (VASCEPA ) 1 g capsule Take 2 capsules by mouth twice daily   ivabradine  (CORLANOR ) 7.5 MG TABS tablet Take 1 tablet (7.5 mg total) by mouth 2 (two) times daily with a meal.   loratadine (CLARITIN) 10 MG tablet Take 10 mg by mouth daily. [facility] brand   metoprolol  succinate (TOPROL -XL) 50 MG 24 hr tablet Take 1 tablet by mouth twice daily   nitroGLYCERIN  (NITROSTAT ) 0.4 MG SL tablet Place 0.4 mg under the tongue every 5 (five) minutes as needed for chest pain.   pantoprazole  (PROTONIX ) 40 MG tablet Take 1 tablet by mouth once daily   Phenazopyridine HCl (AZO TABS PO) Take 1 tablet by mouth daily as needed (painful urination).   raloxifene  (EVISTA ) 60 MG tablet Take 1 tablet by mouth once daily   spironolactone  (ALDACTONE ) 25 MG tablet Take 1 tablet by mouth once daily   spironolactone  (ALDACTONE ) 25 MG tablet Take 1 tablet (25 mg total) by mouth daily.   torsemide  (DEMADEX ) 100 MG tablet Take 0.5 tablets (50 mg total) by mouth daily.   No facility-administered encounter medications  on file as of 03/18/2024.    Allergies (verified) Tape, Plavix [clopidogrel bisulfate], Praluent  [alirocumab ], Statins, and Tamoxifen    History: Past Medical History:  Diagnosis Date   AICD (automatic cardioverter/defibrillator) present 02/25/2017   Biotronik- Dual PPM/AICD   Arthritis    Cancer (HCC) 2022   Rt Breast   CHF (congestive heart failure) (HCC)    Coronary artery disease    Diabetes mellitus without complication (HCC)    Fibromuscular dysplasia (HCC)    FMD (facioscapulohumeral muscular  dystrophy) (HCC)    Headache(784.0)    Heart failure (HCC)    Hyperlipidemia    MI (myocardial infarction) (HCC) 11/19/2016   s/p DES LAD   Migraine    Nonruptured cerebral aneurysm, internal carotid artery 07/14/2008   Left ICA stent placed for pseudoaneurysm, likely from dissection related to fibromuscular dysplasia009)   Palpitations    PONV (postoperative nausea and vomiting)    Pseudoaneurysm (HCC)    both carotids    Stroke (HCC) 06/10/2008   when carotid artery dissected, she was told she had a stroke (pt states she has had dissection on both sides); s/p left ICA stent 07/14/08   Vertigo    Past Surgical History:  Procedure Laterality Date   ABDOMINAL HYSTERECTOMY  08/16/2004   Uterus and Cervix (per pathology)   BAROREFLEX SYSTEM INSERTION  11/14/2023   BREAST LUMPECTOMY Right 07/2021   CARDIAC DEFIBRILLATOR PLACEMENT     CEREBRAL ANEURYSM REPAIR Left 2009   CESAREAN SECTION     CORONARY ANGIOPLASTY WITH STENT PLACEMENT     RIGHT/LEFT HEART CATH AND CORONARY ANGIOGRAPHY N/A 04/07/2020   Procedure: RIGHT/LEFT HEART CATH AND CORONARY ANGIOGRAPHY;  Surgeon: Mardell Shade, MD;  Location: [facility] INVASIVE CV [facility];  Service: Cardiovascular;  Laterality: N/A;   TUBAL LIGATION     Family History  Problem Relation Age of Onset   Thyroid  disease Mother    Gastric cancer Mother 72       w/ signet ring features   Prostate cancer Father        d. 96   Hypertension Brother    Breast cancer Maternal Aunt 33   Brain cancer Maternal Aunt 96   Prostate cancer Paternal Uncle        x2 pat uncles   Prostate cancer Maternal Grandfather        metastatic; d. 70   Cerebral aneurysm Paternal Grandmother        Nonruptured   Dementia Paternal Grandfather    Prostate cancer Paternal Grandfather        metastatic   Social History   Socioeconomic History   Marital status: Married    Spouse name: Tim   Number of children: 4   Years of education: Not on file   Highest education  level: High school graduate  Occupational History   Not on file  Tobacco Use   Smoking status: Former    Current packs/day: 0.00    Average packs/day: 1 pack/day for 6.0 years (6.0 ttl pk-yrs)    Types: Cigarettes    Quit date: 68    Years since quitting: 42.3   Smokeless tobacco: Never   Tobacco comments:    Quit over 30 years ago  Vaping Use   Vaping status: Never Used  Substance and Sexual Activity   Alcohol use: Not Currently   Drug use: No   Sexual activity: Not Currently  Other Topics Concern   Not on file  Social History Narrative   Lives with  husband in a one-story home.     Right handed   Caffeine: "very little"   Social Drivers of Corporate investment banker Strain: Low Risk  (03/18/2024)   Overall Financial Resource Strain (CARDIA)    Difficulty of Paying Living Expenses: Not hard at all  Food Insecurity: No Food Insecurity (03/18/2024)   Hunger Vital Sign    Worried About Running Out of Food in the Last Year: Never true    Ran Out of Food in the Last Year: Never true  Transportation Needs: No Transportation Needs (03/18/2024)   PRAPARE - Administrator, Civil Service (Medical): No    Lack of Transportation (Non-Medical): No  Physical Activity: Sufficiently Active (03/18/2024)   Exercise Vital Sign    Days of Exercise per Week: 5 days    Minutes of Exercise per Session: 30 min  Stress: No Stress Concern Present (03/18/2024)   [employer] of Occupational Health - Occupational Stress Questionnaire    Feeling of Stress : Not at all  Social Connections: Moderately Integrated (03/18/2024)   Social Connection and Isolation Panel [NHANES]    Frequency of Communication with Friends and Family: More than three times a week    Frequency of Social Gatherings with Friends and Family: Three times a week    Attends Religious Services: More than 4 times per year    Active Member of Clubs or Organizations: No    Attends Banker Meetings: Never     Marital Status: Married    Tobacco Counseling Counseling given: Not Answered Tobacco comments: Quit over 30 years ago    Clinical Intake:  Pre-visit preparation completed: Yes  Pain : No/denies pain     Diabetes: Yes CBG done?: No Did pt. bring in CBG monitor from home?: No  Lab Results  Component Value Date   HGBA1C 6.6 (H) 01/28/2024   HGBA1C 6.9 (H) 10/27/2023   HGBA1C 6.8 (H) 07/10/2023     How often do you need to have someone help you when you read instructions, pamphlets, or other written materials from your doctor or pharmacy?: 1 - Never  Interpreter Needed?: No  Information entered by :: Seabron Cypress LPN   Activities of Daily Living     03/18/2024   10:57 AM 11/07/2023   11:53 AM  In your present state of health, do you have any difficulty performing the following activities:  Hearing? 0   Vision? 0   Difficulty concentrating or making decisions? 0   Walking or climbing stairs? 0   Dressing or bathing? 0   Doing errands, shopping? 0 0  Preparing Food and eating ? N   Using the Toilet? N   In the past six months, have you accidently leaked urine? N   Do you have problems with loss of bowel control? N   Managing your Medications? N   Managing your Finances? N   Housekeeping or managing your Housekeeping? N     Patient Care Team: Mercy Stall, MD as PCP - General (Family Medicine) Lei Pump, MD as PCP - Electrophysiology (Cardiology) Bensimhon, Rheta Celestine, MD as PCP - Advanced Heart Failure (Cardiology) Deloria Fetch, MD as Consulting Physician (Oncology) Verlie Glisson, MD as Consulting Physician (Radiation Oncology) Devon Fogo, [facility] (Inactive) (Pharmacist) Luellen Sages, MD as Consulting Physician (Interventional Radiology) Deloria Fetch, MD as Consulting Physician (Oncology) Barbera Books, OD (Ophthalmology) Misenheimer, Emeterio Hansen, MD as Consulting Physician (Unknown Physician Specialty) Tylene Galla,  PA-C as  Physician Assistant (Cardiology)  Indicate any recent Medical Services you may have received from other than [facility] providers in the past year (date may be approximate).     Assessment:   This is a routine wellness examination for Allison White.  Hearing/Vision screen Hearing Screening - Comments:: Denies hearing difficulties   Vision Screening - Comments:: Wears rx glasses - up to date with routine eye exams with Dr. Bolivar Bushman    Goals Addressed             This Visit's Progress    Prevent falls         Depression Screen     03/18/2024   10:55 AM 07/10/2023   11:07 AM 03/27/2023   11:03 AM 01/23/2023   10:38 AM 12/24/2022   11:15 AM 11/29/2021    1:35 PM 08/03/2021    9:08 AM  PHQ 2/9 Scores  PHQ - 2 Score 0 0 2 0 0 0 0  PHQ- 9 Score  9 9        Fall Risk     03/18/2024   10:57 AM 07/10/2023   11:06 AM 03/27/2023   11:02 AM 01/23/2023   10:37 AM 12/24/2022   11:15 AM  Fall Risk   Falls in the past year? 0 1 0 0 0  Number falls in past yr: 0 0 0 0 0  Injury with Fall? 0 0 0 0 0  Risk for fall due to : No Fall Risks No Fall Risks;History of fall(s) No Fall Risks No Fall Risks No Fall Risks  Follow up Falls prevention discussed;Education provided;Falls evaluation completed  Falls evaluation completed;Falls prevention discussed Falls evaluation completed;Education provided Falls evaluation completed    MEDICARE RISK AT HOME:  Medicare Risk at Home Any stairs in or around the home?: No If so, are there any without handrails?: No Home free of loose throw rugs in walkways, pet beds, electrical cords, etc?: Yes Adequate lighting in your home to reduce risk of falls?: Yes Life alert?: No Use of a cane, walker or w/c?: No Grab bars in the bathroom?: Yes Shower chair or bench in shower?: No Elevated toilet seat or a handicapped toilet?: Yes  TIMED UP AND GO:  Was the test performed?  No  Cognitive Function: 6CIT completed        03/18/2024   10:59 AM 01/23/2023   10:39  AM 12/02/2021    1:37 PM 10/26/2020    3:06 PM  6CIT Screen  What Year? 0 points 0 points 0 points 0 points  What month? 0 points 0 points 0 points 0 points  What time? 0 points 0 points 0 points 0 points  Count back from 20 0 points 0 points 0 points 0 points  Months in reverse 0 points 0 points 0 points 0 points  Repeat phrase 0 points 0 points 0 points 0 points  Total Score 0 points 0 points 0 points 0 points    Immunizations Immunization History  Administered Date(s) Administered   Influenza Inj Mdck Quad Pf 09/08/2019, 10/26/2020, 11/29/2021, 12/24/2022   Influenza, Mdck, Trivalent,PF 6+ MOS(egg free) 10/27/2023   Influenza, Quadrivalent, Recombinant, Inj, Pf 08/12/2017, 09/09/2018   PFIZER(Purple Top)SARS-COV-2 Vaccination 02/21/2020, 03/20/2020   PNEUMOCOCCAL CONJUGATE-20 01/28/2024   Pfizer(Comirnaty)Fall Seasonal Vaccine 12 years and older 10/27/2023   Pneumococcal Polysaccharide-23 09/08/2019    Screening Tests Health Maintenance  Topic Date Due   DTaP/Tdap/Td (1 - Tdap) Never done   Zoster Vaccines- Shingrix (1 of 2) Never done  COVID-19 Vaccine (4 - Pfizer risk 2024-25 season) 04/26/2024   INFLUENZA VACCINE  06/18/2024   HEMOGLOBIN A1C  07/30/2024   FOOT EXAM  10/26/2024   OPHTHALMOLOGY EXAM  01/22/2025   Diabetic kidney evaluation - eGFR measurement  01/27/2025   Diabetic kidney evaluation - Urine ACR  01/27/2025   Medicare Annual Wellness (AWV)  03/18/2025   MAMMOGRAM  06/10/2025   Colonoscopy  10/02/2027   Pneumococcal Vaccine 61-53 Years old  Completed   HPV VACCINES  Aged Out   Meningococcal B Vaccine  Aged Out   Hepatitis C Screening  Discontinued   Fecal DNA (Cologuard)  Discontinued    Health Maintenance  Health Maintenance Due  Topic Date Due   DTaP/Tdap/Td (1 - Tdap) Never done   Zoster Vaccines- Shingrix (1 of 2) Never done   Health Maintenance Items Addressed: Declines Shingrix at this time   Additional Screening:  Vision Screening:  Recommended annual ophthalmology exams for early detection of glaucoma and other disorders of the eye.  Dental Screening: Recommended annual dental exams for proper oral hygiene  Community Resource Referral / Chronic Care Management: CRR required this visit?  No   CCM required this visit?  No     Plan:     I have personally reviewed and noted the following in the patient's chart:   Medical and social history Use of alcohol, tobacco or illicit drugs  Current medications and supplements including opioid prescriptions. Patient is not currently taking opioid prescriptions. Functional ability and status Nutritional status Physical activity Advanced directives List of other physicians Hospitalizations, surgeries, and ER visits in previous 12 months Vitals Screenings to include cognitive, depression, and falls Referrals and appointments  In addition, I have reviewed and discussed with patient certain preventive protocols, quality metrics, and best practice recommendations. A written personalized care plan for preventive services as well as general preventive health recommendations were provided to patient.     Seabron Cypress [place], [place]   11/23/1094   After Visit Summary: (MyChart) Due to this being a telephonic visit, the after visit summary with patients personalized plan was offered to patient via MyChart   Notes: Nothing significant to report at this time.

## 2024-03-24 ENCOUNTER — Other Ambulatory Visit: Payer: Self-pay | Admitting: Family Medicine

## 2024-03-24 MED ORDER — HYDROCODONE-ACETAMINOPHEN 7.5-325 MG PO TABS: 1.0000 | ORAL_TABLET | Freq: Four times a day (QID) | ORAL | 0 refills | Status: DC | PRN

## 2024-03-25 ENCOUNTER — Other Ambulatory Visit: Payer: Self-pay | Admitting: Family Medicine

## 2024-04-15 ENCOUNTER — Other Ambulatory Visit: Payer: Self-pay | Admitting: Family Medicine

## 2024-04-15 DIAGNOSIS — I25119 Atherosclerotic heart disease of native coronary artery with unspecified angina pectoris: Secondary | ICD-10-CM

## 2024-04-19 ENCOUNTER — Encounter: Payer: Self-pay | Admitting: Student

## 2024-04-19 ENCOUNTER — Ambulatory Visit: Attending: Student | Admitting: Student

## 2024-04-19 VITALS — BP 114/90 | HR 64 | Ht 65.0 in | Wt 205.8 lb

## 2024-04-19 DIAGNOSIS — Z95810 Presence of automatic (implantable) cardiac defibrillator: Secondary | ICD-10-CM

## 2024-04-19 DIAGNOSIS — I2510 Atherosclerotic heart disease of native coronary artery without angina pectoris: Secondary | ICD-10-CM

## 2024-04-19 DIAGNOSIS — I255 Ischemic cardiomyopathy: Secondary | ICD-10-CM | POA: Diagnosis not present

## 2024-04-19 DIAGNOSIS — I5022 Chronic systolic (congestive) heart failure: Secondary | ICD-10-CM | POA: Diagnosis not present

## 2024-04-19 NOTE — Progress Notes (Signed)
  Cardiology Office Note:   Date:  04/19/2024  ID:  Allison White, DOB 04/30/1961, MRN [id number]  Primary Cardiologist: None Electrophysiologist: Will Cortland Ding, MD   History of Present Illness:   Allison White is a 63 y.o. female with h/o CAD (STEMI 11/19/2016 that is post drug-eluting stent to the LAD and diagonal bifurcation), ICM, chronic CHF, ICD, breast ca (lumpectomy x2 and radiation of her left breast), HLD, HTN, PVD (carotid artery dissection with stenting of left carotid due to possible FMD) seen today for routine electrophysiology followup.   Since last being seen in our clinic the patient reports doing OK. See KCCQ 12 with modest improvement in multiple areas. Less bothered on a day to day basis. Less SOB at fatigue. Still has more trouble in the heat, but overall feels better.   Review of systems complete and found to be negative unless listed in HPI.    EP Information / Studies Reviewed:    EKG is not ordered today.   Arrhythmia/Device History Biotronik dual chamber ICD implanted 02/25/2017  Barostim (commercial) 11/14/2023  HF MD-Dr Bensimhon   Barostim Interrogation- Performed personally and reviewed in detail today,  See scanned report  ICD/PPM interrogation - Not performed today. See last PaceArt report    Physical Exam:   VS:  BP (!) 114/90   Pulse 64   Ht 5\' 5"  (1.651 m)   Wt 205 lb 12.8 oz (93.4 kg)   SpO2 98%   BMI 34.25 kg/m    Wt Readings from Last 3 Encounters:  04/19/24 205 lb 12.8 oz (93.4 kg)  03/18/24 201 lb (91.2 kg)  02/24/24 201 lb 6.4 oz (91.4 kg)     GEN: Well nourished, well developed in no acute distress NECK: No JVD; No carotid bruits CARDIAC: Regular rate and rhythm, no murmurs, rubs, gallops RESPIRATORY:  Clear to auscultation without rales, wheezing or rhonchi  ABDOMEN: Soft, non-tender, non-distended EXTREMITIES:  No edema; No deformity   ASSESSMENT AND PLAN:    Chronic systolic CHF s/p Biotronik Defibrillator and Barostim  implantation NYHA III symptoms.   Device programmed at 4.4 for chronic settings for now. Had recurrent stim today at 5.4-5.6; so unable to go up further at this time.  Device impedence stable. Pt goals are to continue to increase her functional status. Work in her patio garden spring.  Normal device function See scanned report. Pending HF clinic follow up in August, will defer Echo to them.   Disposition:   Follow up with EP APP in in 6 months  Signed, Tylene Galla, PA-C

## 2024-04-19 NOTE — Patient Instructions (Signed)
 Medication Instructions:  No medication changes today. *If you need a refill on your cardiac medications before your next appointment, please call your pharmacy*  Lab Work: No labwork ordered today. If you have labs (blood work) drawn today and your tests are completely normal, you will receive your results only by: MyChart Message (if you have MyChart) OR A paper copy in the mail If you have any lab test that is abnormal or we need to change your treatment, we will call you to review the results.  Testing/Procedures: No testing ordered today  Follow-Up: At [facility], you and your health needs are our priority.  As part of our continuing mission to provide you with exceptional heart care, our providers are all part of one team.  This team includes your primary Cardiologist (physician) and Advanced Practice Providers or APPs (Physician Assistants and Nurse Practitioners) who all work together to provide you with the care you need, when you need it.  Your next appointment:   6 month(s)  Provider:   You may see Will Cortland Ding, MD or one of the following Advanced Practice Providers on your designated Care Team:   Mertha Abrahams, [place] [address] [place], PA-C Suzann Riddle, NP Creighton Doffing, NP    We recommend signing up for the patient portal called "MyChart".  Sign up information is provided on this After Visit Summary.  MyChart is used to connect with patients for Virtual Visits (Telemedicine).  Patients are able to view lab/test results, encounter notes, upcoming appointments, etc.  Non-urgent messages can be sent to your provider as well.   To learn more about what you can do with MyChart, go to [email or web address].

## 2024-04-21 NOTE — Progress Notes (Signed)
 Remote ICD transmission.

## 2024-04-22 ENCOUNTER — Other Ambulatory Visit: Payer: Self-pay

## 2024-04-22 ENCOUNTER — Other Ambulatory Visit (HOSPITAL_COMMUNITY): Payer: Self-pay | Admitting: Internal Medicine

## 2024-04-22 ENCOUNTER — Other Ambulatory Visit (HOSPITAL_COMMUNITY): Payer: Self-pay

## 2024-04-22 ENCOUNTER — Encounter: Payer: Self-pay | Admitting: Family Medicine

## 2024-04-22 MED ORDER — HYDROCODONE-ACETAMINOPHEN 7.5-325 MG PO TABS: 1.0000 | ORAL_TABLET | Freq: Four times a day (QID) | ORAL | 0 refills | Status: DC | PRN

## 2024-04-30 ENCOUNTER — Other Ambulatory Visit: Payer: Self-pay

## 2024-04-30 ENCOUNTER — Other Ambulatory Visit (HOSPITAL_COMMUNITY): Payer: Self-pay

## 2024-05-01 ENCOUNTER — Encounter (HOSPITAL_COMMUNITY): Payer: Self-pay

## 2024-05-01 ENCOUNTER — Other Ambulatory Visit: Payer: Self-pay

## 2024-05-03 ENCOUNTER — Other Ambulatory Visit (HOSPITAL_COMMUNITY): Payer: Self-pay

## 2024-05-04 ENCOUNTER — Ambulatory Visit (INDEPENDENT_AMBULATORY_CARE_PROVIDER_SITE_OTHER): Admitting: Family Medicine

## 2024-05-04 ENCOUNTER — Encounter: Payer: Self-pay | Admitting: Family Medicine

## 2024-05-04 VITALS — BP 112/72 | HR 83 | Temp 98.0°F | Ht 65.0 in | Wt 200.0 lb

## 2024-05-04 DIAGNOSIS — L219 Seborrheic dermatitis, unspecified: Secondary | ICD-10-CM | POA: Diagnosis not present

## 2024-05-04 DIAGNOSIS — N1831 Chronic kidney disease, stage 3a: Secondary | ICD-10-CM | POA: Diagnosis not present

## 2024-05-04 DIAGNOSIS — I773 Arterial fibromuscular dysplasia: Secondary | ICD-10-CM

## 2024-05-04 DIAGNOSIS — E782 Mixed hyperlipidemia: Secondary | ICD-10-CM | POA: Diagnosis not present

## 2024-05-04 DIAGNOSIS — E119 Type 2 diabetes mellitus without complications: Secondary | ICD-10-CM | POA: Diagnosis not present

## 2024-05-04 DIAGNOSIS — E1122 Type 2 diabetes mellitus with diabetic chronic kidney disease: Secondary | ICD-10-CM

## 2024-05-04 DIAGNOSIS — R229 Localized swelling, mass and lump, unspecified: Secondary | ICD-10-CM | POA: Diagnosis not present

## 2024-05-04 DIAGNOSIS — Z6833 Body mass index (BMI) 33.0-33.9, adult: Secondary | ICD-10-CM | POA: Diagnosis not present

## 2024-05-04 DIAGNOSIS — Z170 Estrogen receptor positive status [ER+]: Secondary | ICD-10-CM

## 2024-05-04 DIAGNOSIS — I5022 Chronic systolic (congestive) heart failure: Secondary | ICD-10-CM | POA: Diagnosis not present

## 2024-05-04 DIAGNOSIS — Z95810 Presence of automatic (implantable) cardiac defibrillator: Secondary | ICD-10-CM

## 2024-05-04 DIAGNOSIS — K219 Gastro-esophageal reflux disease without esophagitis: Secondary | ICD-10-CM

## 2024-05-04 DIAGNOSIS — C50412 Malignant neoplasm of upper-outer quadrant of left female breast: Secondary | ICD-10-CM | POA: Diagnosis not present

## 2024-05-04 NOTE — Progress Notes (Unsigned)
 Subjective:  Patient ID: Allison White, female    DOB: 1961-04-20  Age: 63 y.o. MRN: [id number]  Chief Complaint  Patient presents with  . Medical Management of Chronic Issues    HPI:  Diabetes: continue farxiga  10 mg daily. Does not check her sugars. A1C 6.6, UTD on eye exam. Checking feet daily. Eating healthy and exercise.    CHF: taking torsemide  100 mg 1/2 tablet daily, toprol  xl 50 mg twice daily,  spironolactone  25 mg daily, Digoxin  0.125 mg daily, ivabradine  7.5 g twice daily, farxiga  10 mg daily. Rarely has to take ntg.   Hyperlipidemia: currently taking Aspirin 81 mg daily, Vascepa  gm 2 oral twice daily, on repatha  , statins/praluent . Mg once every 2 weeks.    Hypertension: taking metoprolol  xl 50 mg twice daily, torsemide  100 mg 1/2 tablet daily, spironolactone  25 mg daily, Digoxin  0.125 mg daily, ivabradine  7.5 g twice daily, farxiga  10 mg daily.   Headaches- takes vicodin as needed. Takes ibuprofen for headaches.    Breast cancer: on evista . Sees Dr. Harles Lied.    GERD: on Pantoprazole  40 mg daily.   Rash on posterior neck. Itchy. Sporadic nodules come up on her legs. Currently has 4.      05/04/2024    9:30 AM 03/18/2024   10:55 AM 07/10/2023   11:07 AM 03/27/2023   11:03 AM 01/23/2023   10:38 AM  Depression screen PHQ 2/9  Decreased Interest 1 0 0 2 0  Down, Depressed, Hopeless 0 0 0 0 0  PHQ - 2 Score 1 0 0 2 0  Altered sleeping 3  2 2    Tired, decreased energy 3  3 2    Change in appetite 0  2 2   Feeling bad or failure about yourself  0  0 0   Trouble concentrating 0  0 0   Moving slowly or fidgety/restless 1  2 1    Suicidal thoughts 0  0 0   PHQ-9 Score 8  9 9    Difficult doing work/chores Not difficult at all  Somewhat difficult Somewhat difficult         05/04/2024    9:30 AM  Fall Risk   Falls in the past year? 0  Number falls in past yr: 0  Injury with Fall? 0  Risk for fall due to : No Fall Risks  Follow up Falls evaluation completed    Patient  Care Team: Mercy Stall, MD as PCP - General (Family Medicine) Lei Pump, MD as PCP - Electrophysiology (Cardiology) Bensimhon, Rheta Celestine, MD as PCP - Advanced Heart Failure (Cardiology) Deloria Fetch, MD as Consulting Physician (Oncology) Verlie Glisson, MD as Consulting Physician (Radiation Oncology) Devon Fogo, [facility] (Inactive) (Pharmacist) Luellen Sages, MD as Consulting Physician (Interventional Radiology) Deloria Fetch, MD as Consulting Physician (Oncology) Barbera Books, OD (Ophthalmology) Misenheimer, Emeterio Hansen, MD as Consulting Physician (Unknown Physician Specialty) Tylene Galla, PA-C as Physician Assistant (Cardiology)   Review of Systems  Constitutional:  Negative for chills, fatigue and fever.  HENT:  Negative for congestion, ear pain, rhinorrhea and sore throat.   Respiratory:  Negative for cough and shortness of breath.   Cardiovascular:  Negative for chest pain.  Gastrointestinal:  Negative for abdominal pain, constipation, diarrhea, nausea and vomiting.  Genitourinary:  Negative for dysuria and urgency.  Musculoskeletal:  Negative for back pain and myalgias.  Neurological:  Negative for dizziness, weakness, light-headedness and headaches.  Psychiatric/Behavioral:  Negative for dysphoric mood. The patient is not  nervous/anxious.     Current Outpatient Medications on File Prior to Visit  Medication Sig Dispense Refill  . aspirin 81 MG EC tablet Take 81 mg by mouth daily.     . dapagliflozin  propanediol (FARXIGA ) 10 MG TABS tablet Take 1 tablet (10 mg total) by mouth daily. 90 tablet 3  . digoxin  (LANOXIN ) 0.125 MG tablet Take 1 tablet by mouth once daily 90 tablet 3  . Evolocumab  (REPATHA  SURECLICK) 140 MG/ML SOAJ INJECT 140MG  INTO THE SKIN EVERY 14 DAYS 2 mL 2  . HYDROcodone -acetaminophen  (NORCO) 7.5-325 MG tablet Take 1 tablet by mouth every 6 (six) hours as needed for moderate pain (pain score 4-6). 30 tablet 0  . ibuprofen  (ADVIL) 200 MG tablet Take 200-400 mg by mouth every 6 (six) hours as needed for moderate pain (pain score 4-6).    . icosapent  Ethyl (VASCEPA ) 1 g capsule Take 2 capsules by mouth twice daily 120 capsule 0  . ivabradine  (CORLANOR ) 7.5 MG TABS tablet Take 1 tablet (7.5 mg total) by mouth 2 (two) times daily with a meal. 180 tablet 3  . loratadine (CLARITIN) 10 MG tablet Take 10 mg by mouth daily. [facility] brand    . metoprolol  succinate (TOPROL -XL) 50 MG 24 hr tablet Take 1 tablet by mouth twice daily 180 tablet 3  . nitroGLYCERIN  (NITROSTAT ) 0.4 MG SL tablet Place 0.4 mg under the tongue every 5 (five) minutes as needed for chest pain.    . pantoprazole  (PROTONIX ) 40 MG tablet Take 1 tablet by mouth once daily 90 tablet 0  . Phenazopyridine HCl (AZO TABS PO) Take 1 tablet by mouth daily as needed (painful urination).    . raloxifene  (EVISTA ) 60 MG tablet Take 1 tablet by mouth once daily 90 tablet 3  . spironolactone  (ALDACTONE ) 25 MG tablet Take 1 tablet (25 mg total) by mouth daily. 90 tablet 0  . torsemide  (DEMADEX ) 100 MG tablet Take 0.5 tablets (50 mg total) by mouth daily. 45 tablet 3   No current facility-administered medications on file prior to visit.   Past Medical History:  Diagnosis Date  . AICD (automatic cardioverter/defibrillator) present 02/25/2017   Biotronik- Dual PPM/AICD  . Arthritis   . Cancer (HCC) 2022   Rt Breast  . CHF (congestive heart failure) (HCC)   . Coronary artery disease   . Diabetes mellitus without complication (HCC)   . Fibromuscular dysplasia (HCC)   . FMD (facioscapulohumeral muscular dystrophy) (HCC)   . Headache(784.0)   . Heart failure (HCC)   . Hyperlipidemia   . MI (myocardial infarction) (HCC) 11/19/2016   s/p DES LAD  . Migraine   . Nonruptured cerebral aneurysm, internal carotid artery 07/14/2008   Left ICA stent placed for pseudoaneurysm, likely from dissection related to fibromuscular dysplasia009)  . Palpitations   . PONV  (postoperative nausea and vomiting)   . Pseudoaneurysm (HCC)    both carotids   . Stroke (HCC) 06/10/2008   when carotid artery dissected, she was told she had a stroke (pt states she has had dissection on both sides); s/p left ICA stent 07/14/08  . Vertigo    Past Surgical History:  Procedure Laterality Date  . ABDOMINAL HYSTERECTOMY  08/16/2004   Uterus and Cervix (per pathology)  . BAROREFLEX SYSTEM INSERTION  11/14/2023  . BREAST LUMPECTOMY Right 07/2021  . CARDIAC DEFIBRILLATOR PLACEMENT    . CEREBRAL ANEURYSM REPAIR Left 2009  . CESAREAN SECTION    . CORONARY ANGIOPLASTY WITH STENT PLACEMENT    .  RIGHT/LEFT HEART CATH AND CORONARY ANGIOGRAPHY N/A 04/07/2020   Procedure: RIGHT/LEFT HEART CATH AND CORONARY ANGIOGRAPHY;  Surgeon: Mardell Shade, MD;  Location: [facility] INVASIVE CV LAB;  Service: Cardiovascular;  Laterality: N/A;  . TUBAL LIGATION      Family History  Problem Relation Age of Onset  . Thyroid  disease Mother   . Gastric cancer Mother 52       w/ signet ring features  . Prostate cancer Father        d. 48  . Hypertension Brother   . Breast cancer Maternal Aunt 72  . Brain cancer Maternal Aunt 70  . Prostate cancer Paternal Uncle        x2 pat uncles  . Prostate cancer Maternal Grandfather        metastatic; d. 10  . Cerebral aneurysm Paternal Grandmother        Nonruptured  . Dementia Paternal Grandfather   . Prostate cancer Paternal Grandfather        metastatic   Social History   Socioeconomic History  . Marital status: Married    Spouse name: Wandalee Gust  . Number of children: 4  . Years of education: Not on file  . Highest education level: High school graduate  Occupational History  . Not on file  Tobacco Use  . Smoking status: Former    Current packs/day: 0.00    Average packs/day: 1 pack/day for 6.0 years (6.0 ttl pk-yrs)    Types: Cigarettes    Quit date: 30    Years since quitting: 42.4  . Smokeless tobacco: Never  . Tobacco comments:     Quit over 30 years ago  Vaping Use  . Vaping status: Never Used  Substance and Sexual Activity  . Alcohol use: Not Currently  . Drug use: No  . Sexual activity: Not Currently  Other Topics Concern  . Not on file  Social History Narrative   Lives with husband in a one-story home.     Right handed   Caffeine: very little   Social Drivers of Corporate investment banker Strain: Low Risk  (03/18/2024)   Overall Financial Resource Strain (CARDIA)   . Difficulty of Paying Living Expenses: Not hard at all  Food Insecurity: No Food Insecurity (03/18/2024)   Hunger Vital Sign   . Worried About Programme researcher, broadcasting/film/video in the Last Year: Never true   . Ran Out of Food in the Last Year: Never true  Transportation Needs: No Transportation Needs (03/18/2024)   PRAPARE - Transportation   . Lack of Transportation (Medical): No   . Lack of Transportation (Non-Medical): No  Physical Activity: Sufficiently Active (03/18/2024)   Exercise Vital Sign   . Days of Exercise per Week: 5 days   . Minutes of Exercise per Session: 30 min  Stress: No Stress Concern Present (03/18/2024)   [employer] of Occupational Health - Occupational Stress Questionnaire   . Feeling of Stress : Not at all  Social Connections: Moderately Integrated (03/18/2024)   Social Connection and Isolation Panel   . Frequency of Communication with Friends and Family: More than three times a week   . Frequency of Social Gatherings with Friends and Family: Three times a week   . Attends Religious Services: More than 4 times per year   . Active Member of Clubs or Organizations: No   . Attends Banker Meetings: Never   . Marital Status: Married    Objective:  BP 112/72  Pulse 83   Temp 98 F (36.7 C)   Ht 5' 5 (1.651 m)   Wt 200 lb (90.7 kg)   SpO2 98%   BMI 33.28 kg/m      05/04/2024    9:26 AM 04/19/2024   11:12 AM 03/18/2024   10:01 AM  BP/Weight  Systolic BP 112 114 --  Diastolic BP 72 90 --  Wt. (Lbs) 200  205.8 201  BMI 33.28 kg/m2 34.25 kg/m2 33.45 kg/m2    Physical Exam Vitals reviewed.  Constitutional:      Appearance: Normal appearance. She is normal weight.  Neck:     Vascular: No carotid bruit.   Cardiovascular:     Rate and Rhythm: Normal rate and regular rhythm.     Pulses: Normal pulses.     Heart sounds: Normal heart sounds.  Pulmonary:     Effort: Pulmonary effort is normal. No respiratory distress.     Breath sounds: Normal breath sounds.  Abdominal:     General: Abdomen is flat. Bowel sounds are normal.     Palpations: Abdomen is soft.     Tenderness: There is no abdominal tenderness.   Musculoskeletal:     Right lower leg: Edema present.     Left lower leg: Edema present.   Neurological:     Mental Status: She is alert and oriented to person, place, and time.   Psychiatric:        Mood and Affect: Mood normal.        Behavior: Behavior normal.    {Perform Simple Foot Exam  Perform Detailed exam:1} Diabetic foot exam was performed with the following findings:   No deformities, ulcerations, or other skin breakdown Normal sensation of 10g monofilament Intact posterior tibialis and dorsalis pedis pulses      Lab Results  Component Value Date   WBC 7.8 01/28/2024   HGB 17.3 (H) 01/28/2024   HCT 49.8 (H) 01/28/2024   PLT 280 01/28/2024   GLUCOSE 124 (H) 01/28/2024   CHOL 160 01/28/2024   TRIG 291 (H) 01/28/2024   HDL 48 01/28/2024   LDLCALC 66 01/28/2024   ALT 20 01/28/2024   AST 19 01/28/2024   NA 142 01/28/2024   K 4.1 01/28/2024   CL 97 01/28/2024   CREATININE 0.98 01/28/2024   BUN 16 01/28/2024   CO2 27 01/28/2024   TSH 3.720 12/24/2022   INR 1.1 11/07/2023   HGBA1C 6.6 (H) 01/28/2024      Assessment & Plan:  Controlled type 2 diabetes mellitus without complication, without long-term current use of insulin (HCC)  Mixed hyperlipidemia Assessment & Plan: Well controlled.  No changes to medicines. Conitnue vascepa  and  repatha . Continue to work on eating a healthy diet and exercise.  Labs drawn today.     Gastroesophageal reflux disease without esophagitis  Chronic systolic congestive heart failure (HCC)  Fibromuscular dysplasia (HCC)     No orders of the defined types were placed in this encounter.   No orders of the defined types were placed in this encounter.    Follow-up: No follow-ups on file.   I,Marla I Leal-Borjas,acting as a scribe for Mercy Stall, MD.,have documented all relevant documentation on the behalf of Mercy Stall, MD,as directed by  Mercy Stall, MD while in the presence of Mercy Stall, MD.   An After Visit Summary was printed and given to the patient.  Mercy Stall, MD [facility] [phone]

## 2024-05-04 NOTE — Assessment & Plan Note (Signed)
 Well controlled.  No changes to medicines. Conitnue vascepa  and repatha . Continue to work on eating a healthy diet and exercise.  Labs drawn today.

## 2024-05-05 ENCOUNTER — Ambulatory Visit: Payer: Self-pay | Admitting: Family Medicine

## 2024-05-05 DIAGNOSIS — E782 Mixed hyperlipidemia: Secondary | ICD-10-CM

## 2024-05-05 LAB — CBC WITH DIFFERENTIAL/PLATELET
Basophils Absolute: 0.1 10*3/uL (ref 0.0–0.2)
Basos: 1 %
EOS (ABSOLUTE): 0.3 10*3/uL (ref 0.0–0.4)
Eos: 5 %
Hematocrit: 50.9 % — ABNORMAL HIGH (ref 34.0–46.6)
Hemoglobin: 17.2 g/dL — ABNORMAL HIGH (ref 11.1–15.9)
Immature Grans (Abs): 0 10*3/uL (ref 0.0–0.1)
Immature Granulocytes: 0 %
Lymphocytes Absolute: 1.9 10*3/uL (ref 0.7–3.1)
Lymphs: 26 %
MCH: 32.2 pg (ref 26.6–33.0)
MCHC: 33.8 g/dL (ref 31.5–35.7)
MCV: 95 fL (ref 79–97)
Monocytes Absolute: 0.6 10*3/uL (ref 0.1–0.9)
Monocytes: 8 %
Neutrophils Absolute: 4.3 10*3/uL (ref 1.4–7.0)
Neutrophils: 60 %
Platelets: 274 10*3/uL (ref 150–450)
RBC: 5.34 x10E6/uL — ABNORMAL HIGH (ref 3.77–5.28)
RDW: 12.3 % (ref 11.7–15.4)
WBC: 7.1 10*3/uL (ref 3.4–10.8)

## 2024-05-05 LAB — COMPREHENSIVE METABOLIC PANEL WITH GFR
ALT: 18 IU/L (ref 0–32)
AST: 19 IU/L (ref 0–40)
Albumin: 4.5 g/dL (ref 3.9–4.9)
Alkaline Phosphatase: 111 IU/L (ref 44–121)
BUN/Creatinine Ratio: 18 (ref 12–28)
BUN: 20 mg/dL (ref 8–27)
Bilirubin Total: 0.5 mg/dL (ref 0.0–1.2)
CO2: 25 mmol/L (ref 20–29)
Calcium: 10.4 mg/dL — ABNORMAL HIGH (ref 8.7–10.3)
Chloride: 92 mmol/L — ABNORMAL LOW (ref 96–106)
Creatinine, Ser: 1.1 mg/dL — ABNORMAL HIGH (ref 0.57–1.00)
Globulin, Total: 2.5 g/dL (ref 1.5–4.5)
Glucose: 191 mg/dL — ABNORMAL HIGH (ref 70–99)
Potassium: 4.1 mmol/L (ref 3.5–5.2)
Sodium: 139 mmol/L (ref 134–144)
Total Protein: 7 g/dL (ref 6.0–8.5)
eGFR: 57 mL/min/{1.73_m2} — ABNORMAL LOW (ref >59)

## 2024-05-05 LAB — LIPID PANEL
Chol/HDL Ratio: 3.3 ratio (ref 0.0–4.4)
Cholesterol, Total: 157 mg/dL (ref 100–199)
HDL: 47 mg/dL (ref >39)
LDL Chol Calc (NIH): 57 mg/dL (ref 0–99)
Triglycerides: 347 mg/dL — ABNORMAL HIGH (ref 0–149)
VLDL Cholesterol Cal: 53 mg/dL — ABNORMAL HIGH (ref 5–40)

## 2024-05-05 LAB — HEMOGLOBIN A1C
Est. average glucose Bld gHb Est-mCnc: 148 mg/dL
Hgb A1c MFr Bld: 6.8 % — ABNORMAL HIGH (ref 4.8–5.6)

## 2024-05-06 MED ORDER — FENOFIBRATE 160 MG PO TABS: 160.0000 mg | ORAL_TABLET | Freq: Every day | ORAL | 1 refills | Status: DC

## 2024-05-08 DIAGNOSIS — L219 Seborrheic dermatitis, unspecified: Secondary | ICD-10-CM | POA: Insufficient documentation

## 2024-05-08 DIAGNOSIS — R229 Localized swelling, mass and lump, unspecified: Secondary | ICD-10-CM | POA: Insufficient documentation

## 2024-05-08 NOTE — Assessment & Plan Note (Signed)
The current medical regimen is effective;  continue present plan and medications. °Continue pantoprazole 40 mg daily  °

## 2024-05-08 NOTE — Assessment & Plan Note (Signed)
 Management per specialist.

## 2024-05-08 NOTE — Assessment & Plan Note (Signed)
 Referring to dermatology

## 2024-05-08 NOTE — Assessment & Plan Note (Signed)
 Managed with Farxiga  10 mg daily. Stable A1c levels. No regular blood sugar checks required. - Continue Farxiga  10 mg daily. - Check A1C levels

## 2024-05-08 NOTE — Assessment & Plan Note (Signed)
 The current medical regimen is effective;  continue present plan and medications. Continue farxiga, digoxin, corlanor, toprol xl, spironolactone, and torsemide.

## 2024-05-09 NOTE — Assessment & Plan Note (Signed)
 Recommend continue to work on eating healthy diet and exercise.

## 2024-05-09 NOTE — Assessment & Plan Note (Signed)
 Management per specialist.

## 2024-05-09 NOTE — Assessment & Plan Note (Signed)
 Management per specialist.  On evista .

## 2024-05-10 ENCOUNTER — Other Ambulatory Visit: Payer: Self-pay | Admitting: Family Medicine

## 2024-05-10 ENCOUNTER — Other Ambulatory Visit (HOSPITAL_COMMUNITY): Payer: Self-pay | Admitting: *Deleted

## 2024-05-10 DIAGNOSIS — I5022 Chronic systolic (congestive) heart failure: Secondary | ICD-10-CM

## 2024-05-11 ENCOUNTER — Encounter: Payer: Self-pay | Admitting: Family Medicine

## 2024-05-11 MED ORDER — HYDROCODONE-ACETAMINOPHEN 7.5-325 MG PO TABS: 1.0000 | ORAL_TABLET | Freq: Four times a day (QID) | ORAL | 0 refills | Status: DC | PRN

## 2024-05-12 ENCOUNTER — Encounter: Payer: Self-pay | Admitting: Family Medicine

## 2024-05-12 ENCOUNTER — Ambulatory Visit: Payer: Self-pay

## 2024-05-12 ENCOUNTER — Ambulatory Visit (INDEPENDENT_AMBULATORY_CARE_PROVIDER_SITE_OTHER): Admitting: Family Medicine

## 2024-05-12 VITALS — BP 102/70 | HR 105 | Temp 97.8°F | Resp 16 | Ht 65.0 in | Wt 201.6 lb

## 2024-05-12 DIAGNOSIS — K5790 Diverticulosis of intestine, part unspecified, without perforation or abscess without bleeding: Secondary | ICD-10-CM | POA: Diagnosis not present

## 2024-05-12 DIAGNOSIS — R1011 Right upper quadrant pain: Secondary | ICD-10-CM | POA: Insufficient documentation

## 2024-05-12 DIAGNOSIS — K219 Gastro-esophageal reflux disease without esophagitis: Secondary | ICD-10-CM

## 2024-05-12 DIAGNOSIS — R112 Nausea with vomiting, unspecified: Secondary | ICD-10-CM | POA: Insufficient documentation

## 2024-05-12 MED ORDER — ONDANSETRON 4 MG PO TBDP: 4.0000 mg | ORAL_TABLET | Freq: Three times a day (TID) | ORAL | 0 refills | Status: DC | PRN

## 2024-05-12 MED ORDER — VOQUEZNA 10 MG PO TABS: 10.0000 mg | ORAL_TABLET | Freq: Every day | ORAL | Status: DC

## 2024-05-12 NOTE — Assessment & Plan Note (Signed)
 Diverticulosis with previous colonoscopy findings. Current pain location atypical for diverticulitis. - Fu with GI versus antibiotic for flare if symptoms persist

## 2024-05-12 NOTE — Assessment & Plan Note (Addendum)
 Persistent abdominal pain with nausea and vomiting, partially relieved by medications. Differential includes gallbladder disease. Previous inconclusive ultrasound suggests gallbladder involvement.  - Order labs to assess underlying causes. - Order ultrasound of the gallbladder. - Provide Voquezna samples and assess effectiveness before sending that medication.

## 2024-05-12 NOTE — Assessment & Plan Note (Signed)
 Acute since Monday. No others have the same symptoms of N/V.  Plan -  Recommend Drink clear fluids in small amounts as you are able, such as: Water. Ice chips. Fruit juice that has water added (diluted fruit juice). Low-calorie sports drinks. Eat bland, easy-to-digest foods in small amounts as you are able, such as: Bananas. Applesauce. Rice. Low-fat (lean) meats. Toast. Crackers. Avoid drinking fluids that have a lot of sugar or caffeine in them. This includes energy drinks, sports drinks, and soda. Avoid alcohol. Avoid spicy or fatty foods.  - Sent Rx for zofran  4 mg

## 2024-05-12 NOTE — Progress Notes (Signed)
 Acute Office Visit  Subjective:    Patient ID: Allison White, female    DOB: 1961-04-08, 63 y.o.   MRN: [id number]  Chief Complaint  Patient presents with   Abdominal Pain    Started out as heartburn on Monday.     Discussed the use of AI scribe software for clinical note transcription with the patient, who gave verbal consent to proceed.  History of Present Illness   Allison White is a 63 year old female with a history of reflux who presents with nausea, vomiting, and abdominal pain.  Nausea and vomiting began on Monday evening around 8:30 PM, initially accompanied by heartburn, and progressed to involve the stomach and back. She has been unable to retain food or drink, although vomiting has ceased today. Despite taking pantoprazole , Pepto-Bismol, Precedex, and Gas-X, the pain persisted until this morning when she experienced some relief after taking half a Vicodin, Gas-X, and Pepto-Bismol.  The abdominal pain is described as bloating that radiates to the back and shoulders. Eating or drinking exacerbates the pain, and she has vomited after attempting to eat. No diarrhea or constipation, though she had a couple of soft stools on Monday. No fever or runny nose.  She recalls a past episode of similar symptoms following a heart attack, during which she was informed it might be related to her gallbladder or pleurisy. She has a history of diverticulitis but states the current pain is in a different location than her usual diverticulitis pain. She also has interstitial cystitis but does not believe the current symptoms are related to her bladder.  She has not been around anyone sick and has not started any new medications recently. Her appetite has decreased significantly, and she has not been eating or drinking much, which she attributes to the nausea and vomiting. She mentions having had a colonoscopy a year or two ago, which showed a small area of diverticulitis.       Past Medical History:   Diagnosis Date   AICD (automatic cardioverter/defibrillator) present 02/25/2017   Biotronik- Dual PPM/AICD   Arthritis    Cancer (HCC) 2022   Rt Breast   CHF (congestive heart failure) (HCC)    Coronary artery disease    Diabetes mellitus without complication (HCC)    Fibromuscular dysplasia (HCC)    FMD (facioscapulohumeral muscular dystrophy) (HCC)    Headache(784.0)    Heart failure (HCC)    Hyperlipidemia    MI (myocardial infarction) (HCC) 11/19/2016   s/p DES LAD   Migraine    Nonruptured cerebral aneurysm, internal carotid artery 07/14/2008   Left ICA stent placed for pseudoaneurysm, likely from dissection related to fibromuscular dysplasia009)   Palpitations    PONV (postoperative nausea and vomiting)    Pseudoaneurysm (HCC)    both carotids    Stroke (HCC) 06/10/2008   when carotid artery dissected, she was told she had a stroke (pt states she has had dissection on both sides); s/p left ICA stent 07/14/08   Vertigo     Past Surgical History:  Procedure Laterality Date   ABDOMINAL HYSTERECTOMY  08/16/2004   Uterus and Cervix (per pathology)   BAROREFLEX SYSTEM INSERTION  11/14/2023   BREAST LUMPECTOMY Right 07/2021   CARDIAC DEFIBRILLATOR PLACEMENT     CEREBRAL ANEURYSM REPAIR Left 2009   CESAREAN SECTION     CORONARY ANGIOPLASTY WITH STENT PLACEMENT     RIGHT/LEFT HEART CATH AND CORONARY ANGIOGRAPHY N/A 04/07/2020   Procedure: RIGHT/LEFT HEART CATH AND CORONARY ANGIOGRAPHY;  Surgeon: Cherrie Toribio SAUNDERS, MD;  Location: [facility] INVASIVE CV LAB;  Service: Cardiovascular;  Laterality: N/A;   TUBAL LIGATION      Family History  Problem Relation Age of Onset   Thyroid  disease Mother    Gastric cancer Mother 7       w/ signet ring features   Prostate cancer Father        d. 65   Hypertension Brother    Breast cancer Maternal Aunt 34   Brain cancer Maternal Aunt 82   Prostate cancer Paternal Uncle        x2 pat uncles   Prostate cancer Maternal Grandfather         metastatic; d. 57   Cerebral aneurysm Paternal Grandmother        Nonruptured   Dementia Paternal Grandfather    Prostate cancer Paternal Grandfather        metastatic    Social History   Socioeconomic History   Marital status: Married    Spouse name: Tim   Number of children: 4   Years of education: Not on file   Highest education level: High school graduate  Occupational History   Not on file  Tobacco Use   Smoking status: Former    Current packs/day: 0.00    Average packs/day: 1 pack/day for 6.0 years (6.0 ttl pk-yrs)    Types: Cigarettes    Quit date: 80    Years since quitting: 42.5   Smokeless tobacco: Never   Tobacco comments:    Quit over 30 years ago  Vaping Use   Vaping status: Never Used  Substance and Sexual Activity   Alcohol use: Not Currently   Drug use: No   Sexual activity: Not Currently  Other Topics Concern   Not on file  Social History Narrative   Lives with husband in a [place] home.     Right handed   Caffeine: very little   Social Drivers of Corporate investment banker Strain: Low Risk  (03/18/2024)   Overall Financial Resource Strain (CARDIA)    Difficulty of Paying Living Expenses: Not hard at all  Food Insecurity: No Food Insecurity (03/18/2024)   Hunger Vital Sign    Worried About Running Out of Food in the Last Year: Never true    Ran Out of Food in the Last Year: Never true  Transportation Needs: No Transportation Needs (03/18/2024)   PRAPARE - Administrator, Civil Service (Medical): No    Lack of Transportation (Non-Medical): No  Physical Activity: Sufficiently Active (03/18/2024)   Exercise Vital Sign    Days of Exercise per Week: 5 days    Minutes of Exercise per Session: 30 min  Stress: No Stress Concern Present (03/18/2024)   [employer] of Occupational Health - Occupational Stress Questionnaire    Feeling of Stress : Not at all  Social Connections: Moderately Integrated (03/18/2024)   Social Connection and  Isolation Panel    Frequency of Communication with Friends and Family: More than three times a week    Frequency of Social Gatherings with Friends and Family: Three times a week    Attends Religious Services: More than 4 times per year    Active Member of Clubs or Organizations: No    Attends Banker Meetings: Never    Marital Status: Married  Catering manager Violence: Not At Risk (03/18/2024)   Humiliation, Afraid, Rape, and Kick questionnaire    Fear of Current or Ex-Partner: No  Emotionally Abused: No    Physically Abused: No    Sexually Abused: No    Outpatient Medications Prior to Visit  Medication Sig Dispense Refill   aspirin 81 MG EC tablet Take 81 mg by mouth daily.      dapagliflozin  propanediol (FARXIGA ) 10 MG TABS tablet Take 1 tablet (10 mg total) by mouth daily. 90 tablet 3   digoxin  (LANOXIN ) 0.125 MG tablet Take 1 tablet by mouth once daily 90 tablet 3   Evolocumab  (REPATHA  SURECLICK) 140 MG/ML SOAJ INJECT 140MG  INTO THE SKIN EVERY 14 DAYS 2 mL 2   fenofibrate  160 MG tablet Take 1 tablet (160 mg total) by mouth daily. 90 tablet 1   HYDROcodone -acetaminophen  (NORCO) 7.5-325 MG tablet Take 1 tablet by mouth every 6 (six) hours as needed for moderate pain (pain score 4-6). 30 tablet 0   ibuprofen (ADVIL) 200 MG tablet Take 200-400 mg by mouth every 6 (six) hours as needed for moderate pain (pain score 4-6).     icosapent  Ethyl (VASCEPA ) 1 g capsule Take 2 capsules by mouth twice daily 120 capsule 0   ivabradine  (CORLANOR ) 7.5 MG TABS tablet Take 1 tablet (7.5 mg total) by mouth 2 (two) times daily with a meal. 180 tablet 3   loratadine (CLARITIN) 10 MG tablet Take 10 mg by mouth daily. [facility] brand     metoprolol  succinate (TOPROL -XL) 50 MG 24 hr tablet Take 1 tablet by mouth twice daily 180 tablet 3   nitroGLYCERIN  (NITROSTAT ) 0.4 MG SL tablet Place 0.4 mg under the tongue every 5 (five) minutes as needed for chest pain.     pantoprazole  (PROTONIX ) 40 MG  tablet Take 1 tablet by mouth once daily 90 tablet 0   Phenazopyridine HCl (AZO TABS PO) Take 1 tablet by mouth daily as needed (painful urination).     raloxifene  (EVISTA ) 60 MG tablet Take 1 tablet by mouth once daily 90 tablet 3   spironolactone  (ALDACTONE ) 25 MG tablet Take 1 tablet (25 mg total) by mouth daily. 90 tablet 0   torsemide  (DEMADEX ) 100 MG tablet Take 0.5 tablets (50 mg total) by mouth daily. 45 tablet 3   No facility-administered medications prior to visit.    Allergies  Allergen Reactions   Tape Rash    PREFERS CLOTH OR NOTHING   Plavix [Clopidogrel Bisulfate] Other (See Comments)    Joint pain   Praluent  [Alirocumab ]     Headaches, gastrointestinal issues   Statins     Myalgia, neck pain   Tamoxifen  Nausea And Vomiting    Review of Systems  Constitutional:  Positive for appetite change. Negative for chills, diaphoresis, fatigue and fever.  HENT:  Negative for congestion, ear pain and sinus pain.   Eyes: Negative.   Respiratory:  Negative for cough and shortness of breath.   Cardiovascular:  Negative for chest pain and palpitations.  Gastrointestinal:  Positive for abdominal pain (epigastric pain), nausea and vomiting. Negative for constipation and diarrhea.       Bloating  Endocrine: Negative.   Genitourinary:  Negative for dysuria, frequency and urgency.  Musculoskeletal:  Negative for arthralgias.  Allergic/Immunologic: Negative.   Neurological:  Negative for dizziness, weakness, light-headedness and headaches.  Hematological: Negative.   Psychiatric/Behavioral: Negative.  Negative for dysphoric mood. The patient is not nervous/anxious.        Objective:        05/12/2024   10:49 AM 05/04/2024    9:26 AM 04/19/2024   11:12 AM  Vitals with  BMI  Height 5' 5 5' 5 5' 5  Weight 201 lbs 10 oz 200 lbs 205 lbs 13 oz  BMI 33.55 33.28 34.25  Systolic 102 112 885  Diastolic 70 72 90  Pulse 105 83 64    No data found.   Physical Exam Vitals  reviewed.  Constitutional:      General: She is not in acute distress.    Appearance: Normal appearance. She is obese. She is ill-appearing.  Neck:     Vascular: No carotid bruit.   Cardiovascular:     Rate and Rhythm: Normal rate and regular rhythm.     Heart sounds: Normal heart sounds. No murmur heard. Pulmonary:     Effort: Pulmonary effort is normal.     Breath sounds: Normal breath sounds. No wheezing.  Abdominal:     General: Bowel sounds are normal. There is distension.     Palpations: Abdomen is soft.     Tenderness: There is abdominal tenderness in the right upper quadrant and epigastric area.     Hernia: No hernia is present.   Skin:    General: Skin is warm.   Neurological:     Mental Status: She is alert. Mental status is at baseline.   Psychiatric:        Mood and Affect: Mood normal.        Behavior: Behavior normal.     Health Maintenance Due  Topic Date Due   DTaP/Tdap/Td (1 - Tdap) Never done   Zoster Vaccines- Shingrix (1 of 2) Never done   COVID-19 Vaccine (4 - Pfizer risk 2024-25 season) 04/26/2024    There are no preventive care reminders to display for this patient.   Lab Results  Component Value Date   TSH 3.720 12/24/2022   Lab Results  Component Value Date   WBC 7.1 05/04/2024   HGB 17.2 (H) 05/04/2024   HCT 50.9 (H) 05/04/2024   MCV 95 05/04/2024   PLT 274 05/04/2024   Lab Results  Component Value Date   NA 139 05/04/2024   K 4.1 05/04/2024   CO2 25 05/04/2024   GLUCOSE 191 (H) 05/04/2024   BUN 20 05/04/2024   CREATININE 1.10 (H) 05/04/2024   BILITOT 0.5 05/04/2024   ALKPHOS 111 05/04/2024   AST 19 05/04/2024   ALT 18 05/04/2024   PROT 7.0 05/04/2024   ALBUMIN 4.5 05/04/2024   CALCIUM  10.4 (H) 05/04/2024   ANIONGAP 10 11/07/2023   EGFR 57 (L) 05/04/2024   GFR 71.82 07/22/2013   Lab Results  Component Value Date   CHOL 157 05/04/2024   Lab Results  Component Value Date   HDL 47 05/04/2024   Lab Results   Component Value Date   LDLCALC 57 05/04/2024   Lab Results  Component Value Date   TRIG 347 (H) 05/04/2024   Lab Results  Component Value Date   CHOLHDL 3.3 05/04/2024   Lab Results  Component Value Date   HGBA1C 6.8 (H) 05/04/2024       Assessment & Plan:  Right upper quadrant abdominal pain Assessment & Plan: Persistent abdominal pain with nausea and vomiting, partially relieved by medications. Differential includes gallbladder disease. Previous inconclusive ultrasound suggests gallbladder involvement.  - Order labs to assess underlying causes. - Order ultrasound of the gallbladder. - Provide Voquezna samples and assess effectiveness before sending that medication.  Orders: -     CBC with Differential/Platelet -     Comprehensive metabolic panel with GFR -  Lipase -     US  ABDOMEN LIMITED RUQ (LIVER/GB); Future  Nausea and vomiting in adult Assessment & Plan: Acute since Monday. No others have the same symptoms of N/V.  Plan -  Recommend Drink clear fluids in small amounts as you are able, such as: Water. Ice chips. Fruit juice that has water added (diluted fruit juice). Low-calorie sports drinks. Eat bland, easy-to-digest foods in small amounts as you are able, such as: Bananas. Applesauce. Rice. Low-fat (lean) meats. Toast. Crackers. Avoid drinking fluids that have a lot of sugar or caffeine in them. This includes energy drinks, sports drinks, and soda. Avoid alcohol. Avoid spicy or fatty foods.  - Sent Rx for zofran  4 mg  Orders: -     CBC with Differential/Platelet -     Comprehensive metabolic panel with GFR -     Lipase -     US  ABDOMEN LIMITED RUQ (LIVER/GB); Future -     Ondansetron ; Take 1 tablet (4 mg total) by mouth every 8 (eight) hours as needed for nausea or vomiting.  Dispense: 20 tablet; Refill: 0  Gastroesophageal reflux disease without esophagitis Assessment & Plan: GERD exacerbation not fully relieved by current medications.  Voquezna samples provided for trial. - Do not take Protonix  and Voquezna together.  - Provide Voquezna samples for symptom relief.  Orders: -     Voquezna; Take 10 mg by mouth daily.  Diverticulosis Assessment & Plan: Diverticulosis with previous colonoscopy findings. Current pain location atypical for diverticulitis. - Fu with GI versus antibiotic for flare if symptoms persist      Meds ordered this encounter  Medications   Vonoprazan Fumarate (VOQUEZNA) 10 MG TABS    Sig: Take 10 mg by mouth daily.   ondansetron  (ZOFRAN -ODT) 4 MG disintegrating tablet    Sig: Take 1 tablet (4 mg total) by mouth every 8 (eight) hours as needed for nausea or vomiting.    Dispense:  20 tablet    Refill:  0    Orders Placed This Encounter  Procedures   US  Abdomen Limited RUQ (LIVER/GB)   CBC with Differential   Comprehensive metabolic panel with GFR   Lipase     Follow-up: Return if symptoms worsen or fail to improve.  An After Visit Summary was printed and given to the patient.  Harrie Cedar, FNP [facility] [phone]

## 2024-05-12 NOTE — Telephone Encounter (Signed)
 FYI Only or Action Required?: FYI only for provider.  Patient was last seen in primary care on 05/04/2024 by Sherre Clapper, MD. Called Nurse Triage reporting Abdominal Pain. Symptoms began several days ago. Interventions attempted: OTC medications: prilosec and pepto. Symptoms are: unchanged.  Triage Disposition: See HCP Within 4 Hours (Or PCP Triage)  Patient/caregiver understands and will follow disposition?: Yes, will follow disposition  Copied from CRM [id number]. Topic: Clinical - Red Word Triage >> May 12, 2024  9:04 AM Ivette P wrote: Red Word that prompted transfer to Nurse Triage: pt having heart burn since Monday , have not eaten or drank anything, vomiting yellow, like acid. Abdominal pain. Reason for Disposition  [1] MILD-MODERATE pain AND [2] constant AND [3] present > 2 hours  Answer Assessment - Initial Assessment Questions 1. LOCATION: Where does it hurt?      High in stomach 2. RADIATION: Does the pain shoot anywhere else? (e.g., chest, back)     Chest, lower abd 3. ONSET: When did the pain begin? (e.g., minutes, hours or days ago)      3 days 4. SUDDEN: Gradual or sudden onset?     sudden 5. PATTERN Does the pain come and go, or is it constant?    - If it comes and goes: How long does it last? Do you have pain now?     (Note: Comes and goes means the pain is intermittent. It goes away completely between bouts.)    - If constant: Is it getting better, staying the same, or getting worse?      (Note: Constant means the pain never goes away completely; most serious pain is constant and gets worse.)      constant 6. SEVERITY: How bad is the pain?  (e.g., Scale 1-10; mild, moderate, or severe)    - MILD (1-3): Doesn't interfere with normal activities, abdomen soft and not tender to touch.     - MODERATE (4-7): Interferes with normal activities or awakens from sleep, abdomen tender to touch.     - SEVERE (8-10): Excruciating pain, doubled over, unable to do any  normal activities.       7 7. RECURRENT SYMPTOM: Have you ever had this type of stomach pain before? If Yes, ask: When was the last time? and What happened that time?      Feels like heartburn 8. CAUSE: What do you think is causing the stomach pain?     Heartburn that is worse than normal 9. RELIEVING/AGGRAVATING FACTORS: What makes it better or worse? (e.g., antacids, bending or twisting motion, bowel movement)     Food makes worse,  10. OTHER SYMPTOMS: Do you have any other symptoms? (e.g., back pain, diarrhea, fever, urination pain, vomiting)       No vomiting since yesterday, states the vomit is very yellow and acidic  Pt states that her last BM was Monday and it was normal. Pt states that she has not been eating. Pt states that she has eaten a chocolate cookie and tea and has not thrown either item up. Denies SOB, denies new dizziness/lightheadedness, denies diaphoresis, denies neck/jaw pain.  Protocols used: Abdominal Pain - Female-A-AH

## 2024-05-12 NOTE — Assessment & Plan Note (Signed)
 GERD exacerbation not fully relieved by current medications. Voquezna samples provided for trial. - Do not take Protonix  and Voquezna together.  - Provide Voquezna samples for symptom relief.

## 2024-05-13 ENCOUNTER — Ambulatory Visit: Payer: Self-pay | Admitting: Family Medicine

## 2024-05-13 ENCOUNTER — Telehealth: Payer: Self-pay | Admitting: Family Medicine

## 2024-05-13 ENCOUNTER — Ambulatory Visit (HOSPITAL_BASED_OUTPATIENT_CLINIC_OR_DEPARTMENT_OTHER)
Admission: RE | Admit: 2024-05-13 | Discharge: 2024-05-13 | Disposition: A | Discharge status: Home / Self Care | Source: Ambulatory Visit | Attending: Family Medicine | Admitting: Family Medicine

## 2024-05-13 DIAGNOSIS — K828 Other specified diseases of gallbladder: Secondary | ICD-10-CM | POA: Diagnosis not present

## 2024-05-13 DIAGNOSIS — R112 Nausea with vomiting, unspecified: Secondary | ICD-10-CM | POA: Diagnosis not present

## 2024-05-13 DIAGNOSIS — R1011 Right upper quadrant pain: Secondary | ICD-10-CM

## 2024-05-13 DIAGNOSIS — K760 Fatty (change of) liver, not elsewhere classified: Secondary | ICD-10-CM | POA: Diagnosis not present

## 2024-05-13 LAB — COMPREHENSIVE METABOLIC PANEL WITH GFR
ALT: 18 IU/L (ref 0–32)
AST: 15 IU/L (ref 0–40)
Albumin: 4.3 g/dL (ref 3.9–4.9)
Alkaline Phosphatase: 93 IU/L (ref 44–121)
BUN/Creatinine Ratio: 12 (ref 12–28)
BUN: 12 mg/dL (ref 8–27)
Bilirubin Total: 0.7 mg/dL (ref 0.0–1.2)
CO2: 24 mmol/L (ref 20–29)
Calcium: 10.4 mg/dL — ABNORMAL HIGH (ref 8.7–10.3)
Chloride: 95 mmol/L — ABNORMAL LOW (ref 96–106)
Creatinine, Ser: 0.97 mg/dL (ref 0.57–1.00)
Globulin, Total: 2.6 g/dL (ref 1.5–4.5)
Glucose: 144 mg/dL — ABNORMAL HIGH (ref 70–99)
Potassium: 3.7 mmol/L (ref 3.5–5.2)
Sodium: 140 mmol/L (ref 134–144)
Total Protein: 6.9 g/dL (ref 6.0–8.5)
eGFR: 66 mL/min/{1.73_m2} (ref >59)

## 2024-05-13 LAB — CBC WITH DIFFERENTIAL/PLATELET
Basophils Absolute: 0.1 10*3/uL (ref 0.0–0.2)
Basos: 0 %
EOS (ABSOLUTE): 0.2 10*3/uL (ref 0.0–0.4)
Eos: 1 %
Hematocrit: 50.1 % — ABNORMAL HIGH (ref 34.0–46.6)
Hemoglobin: 16.9 g/dL — ABNORMAL HIGH (ref 11.1–15.9)
Immature Grans (Abs): 0 10*3/uL (ref 0.0–0.1)
Immature Granulocytes: 0 %
Lymphocytes Absolute: 2.6 10*3/uL (ref 0.7–3.1)
Lymphs: 17 %
MCH: 32.1 pg (ref 26.6–33.0)
MCHC: 33.7 g/dL (ref 31.5–35.7)
MCV: 95 fL (ref 79–97)
Monocytes Absolute: 1.2 10*3/uL — ABNORMAL HIGH (ref 0.1–0.9)
Monocytes: 8 %
Neutrophils Absolute: 11 10*3/uL — ABNORMAL HIGH (ref 1.4–7.0)
Neutrophils: 74 %
Platelets: 292 10*3/uL (ref 150–450)
RBC: 5.27 x10E6/uL (ref 3.77–5.28)
RDW: 12.7 % (ref 11.7–15.4)
WBC: 15.1 10*3/uL — ABNORMAL HIGH (ref 3.4–10.8)

## 2024-05-13 LAB — LIPASE: Lipase: 51 U/L (ref 14–72)

## 2024-05-13 NOTE — Telephone Encounter (Signed)
 Called patient to discuss results from her labs and imaging. Patient states that she has taken AZO for dysuria. Advised that patient to come into the office to have her urine checked and get a Rochephin shot in addition to hopefully getting a Stat CT of the abdomen and pelvis with contrast based on the incidental finding of fatty liver and possible liver mass lesion.

## 2024-05-14 ENCOUNTER — Ambulatory Visit (HOSPITAL_BASED_OUTPATIENT_CLINIC_OR_DEPARTMENT_OTHER)
Admission: RE | Admit: 2024-05-14 | Discharge: 2024-05-14 | Disposition: A | Discharge status: Home / Self Care | Source: Ambulatory Visit | Attending: Family Medicine | Admitting: Family Medicine

## 2024-05-14 ENCOUNTER — Observation Stay (HOSPITAL_COMMUNITY)
Admission: EM | Admit: 2024-05-14 | Discharge: 2024-05-17 | Disposition: A | Discharge status: Home / Self Care | Attending: Internal Medicine | Admitting: Internal Medicine

## 2024-05-14 ENCOUNTER — Encounter: Payer: Self-pay | Admitting: Family Medicine

## 2024-05-14 ENCOUNTER — Encounter (HOSPITAL_COMMUNITY): Payer: Self-pay

## 2024-05-14 ENCOUNTER — Ambulatory Visit (INDEPENDENT_AMBULATORY_CARE_PROVIDER_SITE_OTHER): Admitting: Family Medicine

## 2024-05-14 ENCOUNTER — Telehealth: Payer: Self-pay | Admitting: Family Medicine

## 2024-05-14 ENCOUNTER — Other Ambulatory Visit: Payer: Self-pay

## 2024-05-14 VITALS — BP 102/64 | HR 72 | Temp 97.8°F | Resp 16 | Ht 65.0 in | Wt 204.6 lb

## 2024-05-14 DIAGNOSIS — I5022 Chronic systolic (congestive) heart failure: Secondary | ICD-10-CM | POA: Diagnosis not present

## 2024-05-14 DIAGNOSIS — I10 Essential (primary) hypertension: Secondary | ICD-10-CM | POA: Diagnosis present

## 2024-05-14 DIAGNOSIS — I110 Hypertensive heart disease with heart failure: Secondary | ICD-10-CM | POA: Insufficient documentation

## 2024-05-14 DIAGNOSIS — E669 Obesity, unspecified: Secondary | ICD-10-CM | POA: Diagnosis not present

## 2024-05-14 DIAGNOSIS — K8012 Calculus of gallbladder with acute and chronic cholecystitis without obstruction: Principal | ICD-10-CM | POA: Insufficient documentation

## 2024-05-14 DIAGNOSIS — E119 Type 2 diabetes mellitus without complications: Secondary | ICD-10-CM | POA: Diagnosis not present

## 2024-05-14 DIAGNOSIS — Z8673 Personal history of transient ischemic attack (TIA), and cerebral infarction without residual deficits: Secondary | ICD-10-CM | POA: Insufficient documentation

## 2024-05-14 DIAGNOSIS — Z95810 Presence of automatic (implantable) cardiac defibrillator: Secondary | ICD-10-CM | POA: Insufficient documentation

## 2024-05-14 DIAGNOSIS — K8000 Calculus of gallbladder with acute cholecystitis without obstruction: Secondary | ICD-10-CM | POA: Diagnosis not present

## 2024-05-14 DIAGNOSIS — Z6833 Body mass index (BMI) 33.0-33.9, adult: Secondary | ICD-10-CM | POA: Insufficient documentation

## 2024-05-14 DIAGNOSIS — R160 Hepatomegaly, not elsewhere classified: Secondary | ICD-10-CM | POA: Diagnosis not present

## 2024-05-14 DIAGNOSIS — Z955 Presence of coronary angioplasty implant and graft: Secondary | ICD-10-CM | POA: Insufficient documentation

## 2024-05-14 DIAGNOSIS — D72829 Elevated white blood cell count, unspecified: Secondary | ICD-10-CM | POA: Diagnosis not present

## 2024-05-14 DIAGNOSIS — Z6834 Body mass index (BMI) 34.0-34.9, adult: Secondary | ICD-10-CM

## 2024-05-14 DIAGNOSIS — Z01810 Encounter for preprocedural cardiovascular examination: Secondary | ICD-10-CM

## 2024-05-14 DIAGNOSIS — E876 Hypokalemia: Secondary | ICD-10-CM | POA: Diagnosis not present

## 2024-05-14 DIAGNOSIS — Z79899 Other long term (current) drug therapy: Secondary | ICD-10-CM | POA: Diagnosis not present

## 2024-05-14 DIAGNOSIS — R1011 Right upper quadrant pain: Secondary | ICD-10-CM

## 2024-05-14 DIAGNOSIS — I2510 Atherosclerotic heart disease of native coronary artery without angina pectoris: Secondary | ICD-10-CM | POA: Diagnosis not present

## 2024-05-14 DIAGNOSIS — K810 Acute cholecystitis: Secondary | ICD-10-CM | POA: Diagnosis not present

## 2024-05-14 DIAGNOSIS — E785 Hyperlipidemia, unspecified: Secondary | ICD-10-CM | POA: Diagnosis not present

## 2024-05-14 DIAGNOSIS — N1831 Chronic kidney disease, stage 3a: Secondary | ICD-10-CM | POA: Diagnosis not present

## 2024-05-14 DIAGNOSIS — E1122 Type 2 diabetes mellitus with diabetic chronic kidney disease: Secondary | ICD-10-CM | POA: Diagnosis not present

## 2024-05-14 DIAGNOSIS — Z853 Personal history of malignant neoplasm of breast: Secondary | ICD-10-CM | POA: Diagnosis not present

## 2024-05-14 DIAGNOSIS — Z950 Presence of cardiac pacemaker: Secondary | ICD-10-CM | POA: Diagnosis not present

## 2024-05-14 DIAGNOSIS — E6609 Other obesity due to excess calories: Secondary | ICD-10-CM

## 2024-05-14 DIAGNOSIS — Z87891 Personal history of nicotine dependence: Secondary | ICD-10-CM | POA: Diagnosis not present

## 2024-05-14 DIAGNOSIS — E66811 Other obesity due to excess calories: Secondary | ICD-10-CM | POA: Insufficient documentation

## 2024-05-14 DIAGNOSIS — R300 Dysuria: Secondary | ICD-10-CM | POA: Insufficient documentation

## 2024-05-14 DIAGNOSIS — K838 Other specified diseases of biliary tract: Secondary | ICD-10-CM | POA: Diagnosis not present

## 2024-05-14 DIAGNOSIS — R932 Abnormal findings on diagnostic imaging of liver and biliary tract: Secondary | ICD-10-CM | POA: Diagnosis not present

## 2024-05-14 DIAGNOSIS — R109 Unspecified abdominal pain: Secondary | ICD-10-CM | POA: Diagnosis present

## 2024-05-14 DIAGNOSIS — K5730 Diverticulosis of large intestine without perforation or abscess without bleeding: Secondary | ICD-10-CM | POA: Diagnosis not present

## 2024-05-14 DIAGNOSIS — K219 Gastro-esophageal reflux disease without esophagitis: Secondary | ICD-10-CM | POA: Diagnosis present

## 2024-05-14 LAB — COMPREHENSIVE METABOLIC PANEL WITH GFR
ALT: 62 U/L — ABNORMAL HIGH (ref 0–44)
AST: 148 U/L — ABNORMAL HIGH (ref 15–41)
Albumin: 3.9 g/dL (ref 3.5–5.0)
Alkaline Phosphatase: 105 U/L (ref 38–126)
Anion gap: 13 (ref 5–15)
BUN: 13 mg/dL (ref 8–23)
CO2: 25 mmol/L (ref 22–32)
Calcium: 9.5 mg/dL (ref 8.9–10.3)
Chloride: 101 mmol/L (ref 98–111)
Creatinine, Ser: 0.97 mg/dL (ref 0.44–1.00)
GFR, Estimated: >60 mL/min (ref >60)
Glucose, Bld: 125 mg/dL — ABNORMAL HIGH (ref 70–99)
Potassium: 3.8 mmol/L (ref 3.5–5.1)
Sodium: 139 mmol/L (ref 135–145)
Total Bilirubin: 1.4 mg/dL — ABNORMAL HIGH (ref 0.0–1.2)
Total Protein: 7.2 g/dL (ref 6.5–8.1)

## 2024-05-14 LAB — CBC WITH DIFFERENTIAL/PLATELET
Abs Immature Granulocytes: 0.05 10*3/uL (ref 0.00–0.07)
Basophils Absolute: 0.1 10*3/uL (ref 0.0–0.1)
Basophils Relative: 0 %
Eosinophils Absolute: 0.3 10*3/uL (ref 0.0–0.5)
Eosinophils Relative: 3 %
HCT: 48.9 % — ABNORMAL HIGH (ref 36.0–46.0)
Hemoglobin: 16.9 g/dL — ABNORMAL HIGH (ref 12.0–15.0)
Immature Granulocytes: 0 %
Lymphocytes Relative: 25 %
Lymphs Abs: 3.1 10*3/uL (ref 0.7–4.0)
MCH: 32 pg (ref 26.0–34.0)
MCHC: 34.6 g/dL (ref 30.0–36.0)
MCV: 92.6 fL (ref 80.0–100.0)
Monocytes Absolute: 1.2 10*3/uL — ABNORMAL HIGH (ref 0.1–1.0)
Monocytes Relative: 10 %
Neutro Abs: 7.5 10*3/uL (ref 1.7–7.7)
Neutrophils Relative %: 62 %
Platelets: 292 10*3/uL (ref 150–400)
RBC: 5.28 MIL/uL — ABNORMAL HIGH (ref 3.87–5.11)
RDW: 12.8 % (ref 11.5–15.5)
WBC: 12.2 10*3/uL — ABNORMAL HIGH (ref 4.0–10.5)
nRBC: 0 % (ref 0.0–0.2)

## 2024-05-14 LAB — I-STAT CG4 LACTIC ACID, ED
Lactic Acid, Venous: 2.9 mmol/L (ref 0.5–1.9)
Lactic Acid, Venous: 1.9 mmol/L (ref 0.5–1.9)

## 2024-05-14 LAB — POCT URINALYSIS DIP (CLINITEK)
Bilirubin, UA: NEGATIVE
Blood, UA: NEGATIVE
Glucose, UA: >=1000 mg/dL — AB
Ketones, POC UA: NEGATIVE mg/dL
Leukocytes, UA: NEGATIVE
Nitrite, UA: NEGATIVE
POC PROTEIN,UA: NEGATIVE
Spec Grav, UA: 1.015 (ref 1.010–1.025)
Urobilinogen, UA: 0.2 U/dL
pH, UA: 6 (ref 5.0–8.0)

## 2024-05-14 LAB — URINALYSIS, ROUTINE W REFLEX MICROSCOPIC
Bilirubin Urine: NEGATIVE
Glucose, UA: 150 mg/dL — AB
Hgb urine dipstick: NEGATIVE
Ketones, ur: NEGATIVE mg/dL
Nitrite: NEGATIVE
Protein, ur: NEGATIVE mg/dL
Specific Gravity, Urine: 1.02 (ref 1.005–1.030)
pH: 5 (ref 5.0–8.0)

## 2024-05-14 LAB — LIPASE, BLOOD: Lipase: 39 U/L (ref 11–51)

## 2024-05-14 LAB — CBG MONITORING, ED: Glucose-Capillary: 108 mg/dL — ABNORMAL HIGH (ref 70–99)

## 2024-05-14 MED ORDER — HYDROMORPHONE HCL 1 MG/ML IJ SOLN
0.5000 mg | INTRAMUSCULAR | Status: DC | PRN
  Administered 2024-05-14 – 2024-05-17 (×7): 0.5 mg via INTRAVENOUS
  Filled 2024-05-14 (×4): qty 0.5
  Filled 2024-05-14: qty 1
  Filled 2024-05-14 (×2): qty 0.5

## 2024-05-14 MED ORDER — FENOFIBRATE 160 MG PO TABS
160.0000 mg | ORAL_TABLET | Freq: Every morning | ORAL | Status: DC
  Administered 2024-05-15 – 2024-05-17 (×3): 160 mg via ORAL
  Filled 2024-05-14 (×4): qty 1

## 2024-05-14 MED ORDER — IVABRADINE HCL 5 MG PO TABS
7.5000 mg | ORAL_TABLET | Freq: Two times a day (BID) | ORAL | Status: DC
  Administered 2024-05-15 – 2024-05-17 (×4): 7.5 mg via ORAL
  Filled 2024-05-14 (×7): qty 1

## 2024-05-14 MED ORDER — NITROGLYCERIN 0.4 MG SL SUBL: 0.4000 mg | SUBLINGUAL_TABLET | SUBLINGUAL | Status: DC | PRN

## 2024-05-14 MED ORDER — ONDANSETRON HCL 4 MG/2ML IJ SOLN
4.0000 mg | Freq: Once | INTRAMUSCULAR | Status: AC
  Administered 2024-05-14: 4 mg via INTRAVENOUS
  Filled 2024-05-14: qty 2

## 2024-05-14 MED ORDER — PIPERACILLIN-TAZOBACTAM 3.375 G IVPB 30 MIN
3.3750 g | Freq: Once | INTRAVENOUS | Status: AC
  Administered 2024-05-14: 3.375 g via INTRAVENOUS
  Filled 2024-05-14: qty 50

## 2024-05-14 MED ORDER — DIGOXIN 125 MCG PO TABS
0.1250 mg | ORAL_TABLET | Freq: Every day | ORAL | Status: DC
  Administered 2024-05-14 – 2024-05-16 (×3): 0.125 mg via ORAL
  Filled 2024-05-14 (×3): qty 1

## 2024-05-14 MED ORDER — ASPIRIN 81 MG PO TBEC
81.0000 mg | DELAYED_RELEASE_TABLET | Freq: Every morning | ORAL | Status: DC
  Administered 2024-05-15 – 2024-05-17 (×3): 81 mg via ORAL
  Filled 2024-05-14 (×3): qty 1

## 2024-05-14 MED ORDER — RALOXIFENE HCL 60 MG PO TABS
60.0000 mg | ORAL_TABLET | Freq: Every day | ORAL | Status: DC
  Administered 2024-05-14 – 2024-05-16 (×3): 60 mg via ORAL
  Filled 2024-05-14 (×4): qty 1

## 2024-05-14 MED ORDER — PIPERACILLIN-TAZOBACTAM 3.375 G IVPB
3.3750 g | Freq: Three times a day (TID) | INTRAVENOUS | Status: DC
  Administered 2024-05-14 – 2024-05-17 (×8): 3.375 g via INTRAVENOUS
  Filled 2024-05-14 (×9): qty 50

## 2024-05-14 MED ORDER — PANTOPRAZOLE SODIUM 40 MG IV SOLR
40.0000 mg | INTRAVENOUS | Status: DC
  Administered 2024-05-14: 40 mg via INTRAVENOUS
  Filled 2024-05-14: qty 10

## 2024-05-14 MED ORDER — ENOXAPARIN SODIUM 40 MG/0.4ML IJ SOSY
40.0000 mg | PREFILLED_SYRINGE | INTRAMUSCULAR | Status: DC
  Administered 2024-05-14 – 2024-05-16 (×3): 40 mg via SUBCUTANEOUS
  Filled 2024-05-14 (×3): qty 0.4

## 2024-05-14 MED ORDER — HYDROMORPHONE HCL 1 MG/ML IJ SOLN
0.5000 mg | Freq: Once | INTRAMUSCULAR | Status: AC
  Administered 2024-05-14: 0.5 mg via INTRAVENOUS
  Filled 2024-05-14: qty 1

## 2024-05-14 MED ORDER — SPIRONOLACTONE 25 MG PO TABS
25.0000 mg | ORAL_TABLET | Freq: Every morning | ORAL | Status: DC
  Administered 2024-05-15 – 2024-05-17 (×3): 25 mg via ORAL
  Filled 2024-05-14 (×3): qty 1

## 2024-05-14 MED ORDER — TORSEMIDE 20 MG PO TABS
50.0000 mg | ORAL_TABLET | Freq: Every morning | ORAL | Status: DC
  Administered 2024-05-15: 50 mg via ORAL
  Filled 2024-05-14 (×3): qty 3

## 2024-05-14 MED ORDER — DAPAGLIFLOZIN PROPANEDIOL 10 MG PO TABS
10.0000 mg | ORAL_TABLET | Freq: Every morning | ORAL | Status: DC
  Administered 2024-05-15 – 2024-05-17 (×3): 10 mg via ORAL
  Filled 2024-05-14 (×3): qty 1

## 2024-05-14 MED ORDER — LORATADINE 10 MG PO TABS
10.0000 mg | ORAL_TABLET | Freq: Every morning | ORAL | Status: DC
  Administered 2024-05-15 – 2024-05-17 (×3): 10 mg via ORAL
  Filled 2024-05-14 (×3): qty 1

## 2024-05-14 MED ORDER — ONDANSETRON HCL 4 MG/2ML IJ SOLN
4.0000 mg | Freq: Four times a day (QID) | INTRAMUSCULAR | Status: DC | PRN
  Administered 2024-05-14: 4 mg via INTRAVENOUS
  Filled 2024-05-14: qty 2

## 2024-05-14 MED ORDER — ICOSAPENT ETHYL 1 G PO CAPS
2.0000 g | ORAL_CAPSULE | Freq: Two times a day (BID) | ORAL | Status: DC
  Administered 2024-05-14 – 2024-05-17 (×5): 2 g via ORAL
  Filled 2024-05-14 (×7): qty 2

## 2024-05-14 MED ORDER — ALBUTEROL SULFATE (2.5 MG/3ML) 0.083% IN NEBU: 2.5000 mg | INHALATION_SOLUTION | RESPIRATORY_TRACT | Status: DC | PRN

## 2024-05-14 MED ORDER — HYDROCODONE-ACETAMINOPHEN 7.5-325 MG PO TABS: 1.0000 | ORAL_TABLET | Freq: Four times a day (QID) | ORAL | Status: DC | PRN

## 2024-05-14 MED ORDER — METOPROLOL SUCCINATE ER 50 MG PO TB24
50.0000 mg | ORAL_TABLET | Freq: Two times a day (BID) | ORAL | Status: DC
  Administered 2024-05-14 – 2024-05-17 (×6): 50 mg via ORAL
  Filled 2024-05-14 (×5): qty 1
  Filled 2024-05-14: qty 2

## 2024-05-14 MED ORDER — SODIUM CHLORIDE 0.9 % IV BOLUS
1000.0000 mL | Freq: Once | INTRAVENOUS | Status: AC
  Administered 2024-05-14: 1000 mL via INTRAVENOUS

## 2024-05-14 MED ORDER — IOHEXOL 300 MG/ML  SOLN
100.0000 mL | Freq: Once | INTRAMUSCULAR | Status: AC | PRN
Start: 2024-05-14 — End: 2024-05-14
  Administered 2024-05-14: 100 mL via INTRAVENOUS

## 2024-05-14 MED ORDER — CIPROFLOXACIN HCL 500 MG PO TABS
500.0000 mg | ORAL_TABLET | Freq: Two times a day (BID) | ORAL | 0 refills | Status: DC
Start: 2024-05-14 — End: 2024-05-21

## 2024-05-14 NOTE — Consult Note (Signed)
 [name] Nov 28, 1960  [id number].    Requesting MD: [place]  Chief Complaint/Reason for Consult: Abdominal Pain/ Acute Cholecystitis   HPI: Lylee Corrow is a 63 y.o. female with a complicated history including heart failure with ejection fraction of 20 to 25% with insertion of Barostim device who presents to [facility] ED with complaints of abdominal pain that started on Monday with emesis but denies having fever/chills, or diarrhea. Patient was seen by her PCP 2 days ago and they had concerns about possible gallbladder disease. Patient said that the pain is concentrated in the epigastric and RUQ regions and that she has never had this kind of pain before.    Of note patient states that having heart failure is affected a lot of her ADLs including walking and taking stairs. States that she usually has to stop to take of breath before continuing. This is not unusual based on her EF of 20 to 25%. Patient also has ICD in place.   Past Medical History: As below Prior Abdominal Surgeries: Hysterectomy, tubal ligation Blood Thinners: None due to carotid stents/Barostim device insertion Last PO intake: Yesterday per patient Last Colonoscopy: 1 year ago Allergies: As below Tobacco Use: Denies Alcohol Use: Occasionally drinks wine Substance use: Denies   ROS: Review of Systems  Constitutional:  Negative for chills and fever.  Gastrointestinal:  Positive for abdominal pain, constipation and vomiting. Negative for blood in stool, diarrhea and nausea.    Family History  Problem Relation Age of Onset   Thyroid  disease Mother    Gastric cancer Mother 32       w/ signet ring features   Prostate cancer Father        d. 25   Hypertension Brother    Breast cancer Maternal Aunt 33   Brain cancer Maternal Aunt 71   Prostate cancer Paternal Uncle        x2 pat uncles   Prostate cancer Maternal Grandfather        metastatic; d. 14   Cerebral aneurysm Paternal Grandmother         Nonruptured   Dementia Paternal Grandfather    Prostate cancer Paternal Grandfather        metastatic    Past Medical History:  Diagnosis Date   AICD (automatic cardioverter/defibrillator) present 02/25/2017   Biotronik- Dual PPM/AICD   Arthritis    Cancer (HCC) 2022   Rt Breast   CHF (congestive heart failure) (HCC)    Coronary artery disease    Diabetes mellitus without complication (HCC)    Fibromuscular dysplasia (HCC)    FMD (facioscapulohumeral muscular dystrophy) (HCC)    Headache(784.0)    Heart failure (HCC)    Hyperlipidemia    MI (myocardial infarction) (HCC) 11/19/2016   s/p DES LAD   Migraine    Nonruptured cerebral aneurysm, internal carotid artery 07/14/2008   Left ICA stent placed for pseudoaneurysm, likely from dissection related to fibromuscular dysplasia009)   Palpitations    PONV (postoperative nausea and vomiting)    Pseudoaneurysm (HCC)    both carotids    Stroke (HCC) 06/10/2008   when carotid artery dissected, she was told she had a stroke (pt states she has had dissection on both sides); s/p left ICA stent 07/14/08   Vertigo     Past Surgical History:  Procedure Laterality Date   ABDOMINAL HYSTERECTOMY  08/16/2004   Uterus and Cervix (per pathology)   BAROREFLEX SYSTEM INSERTION  11/14/2023   BREAST LUMPECTOMY Right  07/2021   CARDIAC DEFIBRILLATOR PLACEMENT     CEREBRAL ANEURYSM REPAIR Left 2009   CESAREAN SECTION     CORONARY ANGIOPLASTY WITH STENT PLACEMENT     RIGHT/LEFT HEART CATH AND CORONARY ANGIOGRAPHY N/A 04/07/2020   Procedure: RIGHT/LEFT HEART CATH AND CORONARY ANGIOGRAPHY;  Surgeon: Cherrie Toribio SAUNDERS, MD;  Location: [facility] INVASIVE CV LAB;  Service: Cardiovascular;  Laterality: N/A;   TUBAL LIGATION      Social History:  reports that she quit smoking about 42 years ago. Her smoking use included cigarettes. She has a 6 pack-year smoking history. She has never used smokeless tobacco. She reports that she does not currently use alcohol.  She reports that she does not use drugs.  Allergies:  Allergies  Allergen Reactions   Tape Rash    PREFERS CLOTH OR NOTHING   Plavix [Clopidogrel Bisulfate] Other (See Comments)    Joint pain   Praluent  [Alirocumab ]     Headaches, gastrointestinal issues   Statins     Myalgia, neck pain   Tamoxifen  Nausea And Vomiting    (Not in a hospital admission)    Physical Exam: Blood pressure (!) 117/45, pulse 74, temperature (!) 97.5 F (36.4 C), resp. rate 17, height 5' 5 (1.651 m), weight 92.5 kg, SpO2 97%. Physical Exam Constitutional:      General: She is not in acute distress.    Appearance: She is not ill-appearing.   Cardiovascular:     Rate and Rhythm: Normal rate.  Pulmonary:     Effort: Pulmonary effort is normal.  Abdominal:     General: Abdomen is protuberant. There is no distension.     Palpations: Abdomen is soft.     Tenderness: There is abdominal tenderness in the right upper quadrant and epigastric area. There is no guarding.   Skin:    General: Skin is warm and dry.   Neurological:     General: No focal deficit present.     Mental Status: She is alert and oriented to person, place, and time.      Results for orders placed or performed during the hospital encounter of 05/14/24 (from the past 48 hours)  CBC with Differential     Status: Abnormal   Collection Time: 05/14/24  2:30 PM  Result Value Ref Range   WBC 12.2 (H) 4.0 - 10.5 K/uL   RBC 5.28 (H) 3.87 - 5.11 MIL/uL   Hemoglobin 16.9 (H) 12.0 - 15.0 g/dL   HCT 51.0 (H) 63.9 - 53.9 %   MCV 92.6 80.0 - 100.0 fL   MCH 32.0 26.0 - 34.0 pg   MCHC 34.6 30.0 - 36.0 g/dL   RDW 87.1 88.4 - 84.4 %   Platelets 292 150 - 400 K/uL   nRBC 0.0 0.0 - 0.2 %   Neutrophils Relative % 62 %   Neutro Abs 7.5 1.7 - 7.7 K/uL   Lymphocytes Relative 25 %   Lymphs Abs 3.1 0.7 - 4.0 K/uL   Monocytes Relative 10 %   Monocytes Absolute 1.2 (H) 0.1 - 1.0 K/uL   Eosinophils Relative 3 %   Eosinophils Absolute 0.3 0.0 -  0.5 K/uL   Basophils Relative 0 %   Basophils Absolute 0.1 0.0 - 0.1 K/uL   Immature Granulocytes 0 %   Abs Immature Granulocytes 0.05 0.00 - 0.07 K/uL    Comment: Performed at [facility], [address], [place], [place] [address]  I-Stat CG4 Lactic Acid     Status: Abnormal  Collection Time: 05/14/24  2:44 PM  Result Value Ref Range   Lactic Acid, Venous 2.9 (HH) 0.5 - 1.9 mmol/L   Comment NOTIFIED PHYSICIAN    CT ABDOMEN PELVIS W CONTRAST Result Date: 05/14/2024 CLINICAL DATA:  Right upper quadrant abdominal pain. EXAM: CT ABDOMEN AND PELVIS WITH CONTRAST TECHNIQUE: Multidetector CT imaging of the abdomen and pelvis was performed using the standard protocol following bolus administration of intravenous contrast. RADIATION DOSE REDUCTION: This exam was performed according to the departmental dose-optimization program which includes automated exposure control, adjustment of the mA and/or kV according to patient size and/or use of iterative reconstruction technique. CONTRAST:  OMNIPAQUE  IOHEXOL  300 MG/ML  SOLN COMPARISON:  11/19/2016 FINDINGS: Lower chest: No acute abnormality. Hepatobiliary: Heterogeneous enhancement on the arterial phase images noted. On the portal venous phase images there is a uniform enhancement pattern of the liver without suspicious liver abnormality. Several subcentimeter low-density lesions are identified which are technically too small to reliably characterize but favored to represent small cysts. Index lesion within segment 8/4 measures 7 mm, image 22/301. Gallbladder wall enhancement with mild thickening and pericholecystic soft tissue stranding noted. No calcified gallstones identified. There is fusiform dilatation of the common bile duct which measures 1.0 cm, image 115/601. There is mild to moderate intrahepatic bile duct dilatation. No calcified stones identified along the course of the ureter. No mass identified. Pancreas: Unremarkable. No pancreatic  ductal dilatation or surrounding inflammatory changes. Spleen: Small low-density structure within the spleen measures 6 mm, image 37/310. Spleen otherwise normal. Adrenals/Urinary Tract: There is nodular thickening of both adrenal glands, unchanged from 11/19/2016 compatible with underlying adenomas. No follow-up imaging recommended. No kidney mass or obstructive uropathy. Contrast is identified within the collecting systems of both kidneys an ureters. Urinary bladder is within normal limits. Stomach/Bowel: Stomach is normal. The appendix is visualized and is normal. Diffuse colonic diverticulosis is most severe at the level of the sigmoid colon. Mild wall thickening with subtle adjacent fat stranding is identified involving the mid sigmoid colon, image 136/301. These findings may reflect sequelae of chronic diverticular disease. Uncomplicated acute diverticulitis not excluded. Vascular/Lymphatic: Aortic atherosclerosis. No enlarged abdominal or pelvic lymph nodes. Reproductive: Status post hysterectomy. No adnexal masses. Other: No free fluid or fluid collections. No signs of pneumoperitoneum. Musculoskeletal: No acute or significant osseous findings. IMPRESSION: 1. Gallbladder wall enhancement with mild thickening and pericholecystic soft tissue stranding noted. No calcified gallstones identified. Findings are concerning for acute cholecystitis. 2. Fusiform dilatation of the common bile duct which measures 1.0 cm. There is mild to moderate intrahepatic bile duct dilatation. No calcified stones identified along the course of the ureter. No mass identified. Consider further evaluation with MRCP. 3. Diffuse colonic diverticulosis is most severe at the level of the sigmoid colon. Mild wall thickening with subtle adjacent fat stranding is identified involving the mid sigmoid colon. These findings may reflect sequelae of chronic diverticular disease. Uncomplicated mild acute diverticulitis not excluded. 4. Stable  nodular thickening of both adrenal glands, unchanged from 11/19/2016 compatible with underlying adenomas. No follow-up imaging recommended. 5.  Aortic Atherosclerosis (ICD10-I70.0). Electronically Signed   By: Waddell Calk M.D.   On: 05/14/2024 11:49   US  Abdomen Limited RUQ (LIVER/GB) Result Date: 05/13/2024 CLINICAL DATA:  Right upper quadrant pain.  Nausea vomiting EXAM: ULTRASOUND ABDOMEN LIMITED RIGHT UPPER QUADRANT COMPARISON:  None Available. FINDINGS: Gallbladder: Dilated gallbladder. Dependent stones. No wall thickening or adjacent fluid. No reported Murphy's sign. Common bile duct: Diameter: 3 mm Liver: Diffusely echogenic  hepatic parenchyma consistent with fatty liver infiltration. With this level of echogenicity evaluation for underlying mass lesion is limited and if needed follow-up contrast CT or MRI as clinically appropriate. Portal vein is patent on color Doppler imaging with normal direction of blood flow towards the liver. Other: None. IMPRESSION: Fatty liver infiltration. Dilated gallbladder. Dependent stones. No further sonographic evidence of acute cholecystitis. No ductal dilatation Electronically Signed   By: Ranell Bring M.D.   On: 05/13/2024 11:31    Anti-infectives (From admission, onward)    Start     Dose/Rate Route Frequency Ordered Stop   05/14/24 1430  piperacillin-tazobactam (ZOSYN) IVPB 3.375 g        3.375 g 100 mL/hr over 30 Minutes Intravenous  Once 05/14/24 1429 05/14/24 1514       Assessment/Plan Acute Calculous Cholecystitis  CT shows-Gallbladder wall enhancement with mild thickening and pericholecystic soft tissue stranding but no calcified gallstones identified. Findings are concerning for acute cholecystitis. However, US  shows a dilated gallbladder. Dependent stones. No wall thickening or adjacent fluid. Based on imaging and physical exam which reveals tenderness in RUQ and epigastrium, patient would be a likely candidate for laparoscopic cholecystitis.  WBC 12, CMP pending at this time. However, with her cardiac hx, a cardiology consult will be necessary for surgical risk stratification. Patient is HDS and in NAD. Surgical intervention is not urgent but recommended.     FEN - Can have clears, NPO at midnight pending surgical timing  VTE - SCD's ID - Zosyn given in ED Foley - None  Dispo - Admit to [facility].   I reviewed nursing notes, ED provider notes, last 24 h vitals and pain scores, last 48 h intake and output, last 24 h labs and trends, and last 24 h imaging results.  This care required high  level of medical decision making.   Eulah Hammonds, PA-C [facility] [address] Surgery 05/14/2024, 3:25 PM Please see Amion for pager number during day hours 7:00am-4:30pm

## 2024-05-14 NOTE — Assessment & Plan Note (Signed)
 Urine dipstick shows positive for glucose. - send urine for culture - UTI suspected based on symptoms, side effect of Farxiga , and leukocytosis. - Cipro  ordered 500 mg by mouth TWICE A DAY for 7 days.   Your symptoms should gradually improve. Call if the burning worsens, you develop a fever, back pain or pelvic pain or if your symptom do not resolve after completing the antibiotic. Drink plenty of fluids Complete the full course of antibiotics even if the symptoms resolve Remember to wipe from front to back and don't hold it in! If possible, empty your bladder every 4 hours.

## 2024-05-14 NOTE — Telephone Encounter (Signed)
 Called patient with results from CT with contrast. She has acute cholecystitis and she was instructed to go to [facility] in [place] for IV antibiotic treatment and surgery.

## 2024-05-14 NOTE — Assessment & Plan Note (Signed)
 BMI 34.05, not at goal Improving - Continue to work on diet and exercise

## 2024-05-14 NOTE — ED Provider Notes (Signed)
 [place] [facility] AT [facility] Provider Note   CSN: [id number] Arrival date & time: 05/14/24  1320     Patient presents with: Abdominal Pain   Allison White is a 63 y.o. female.    Patient with history of chronic systolic congestive heart failure with pacemaker and ICD implanted, torsemide  use, hyperlipidemia, coronary artery disease, chronic kidney disease, hypertension --presents to the emergency department today for evaluation of abdominal pain and vomiting.  Symptoms started 4 days ago.  Patient saw her primary care doctor 2 days ago.  There was concern for gallbladder disease.  Patient had an ultrasound which showed gallstones but no definitive cholecystitis.  She had lab work which demonstrated an elevated white blood cell count.  Patient was sent for CT imaging which was performed today and this confirmed cholecystitis.  Patient sent to the emergency department for treatment.  She currently complains of pain and nausea.  She has not vomited today.  She has not had anything to eat since 5 AM.       Prior to Admission medications   Medication Sig Start Date End Date Taking? Authorizing Provider  aspirin 81 MG EC tablet Take 81 mg by mouth daily.  02/26/17   [provider]  ciprofloxacin  (CIPRO ) 500 MG tablet Take 1 tablet (500 mg total) by mouth 2 (two) times daily for 7 days. 05/14/24 05/21/24  Teressa Harrie HERO, FNP  dapagliflozin  propanediol (FARXIGA ) 10 MG TABS tablet Take 1 tablet (10 mg total) by mouth daily. 02/26/23   Bensimhon, Toribio SAUNDERS, MD  digoxin  (LANOXIN ) 0.125 MG tablet Take 1 tablet by mouth once daily 05/23/23   Bensimhon, Toribio SAUNDERS, MD  Evolocumab  (REPATHA  SURECLICK) 140 MG/ML SOAJ INJECT 140MG  INTO THE SKIN EVERY 14 DAYS 03/03/24   Cox, Abigail, MD  fenofibrate  160 MG tablet Take 1 tablet (160 mg total) by mouth daily. 05/06/24   CoxAbigail, MD  HYDROcodone -acetaminophen  (NORCO) 7.5-325 MG tablet Take 1 tablet by mouth every 6 (six) hours  as needed for moderate pain (pain score 4-6). 05/11/24   CoxAbigail, MD  ibuprofen (ADVIL) 200 MG tablet Take 200-400 mg by mouth every 6 (six) hours as needed for moderate pain (pain score 4-6).    [provider]  icosapent  Ethyl (VASCEPA ) 1 g capsule Take 2 capsules by mouth twice daily 04/15/24   Cox, Abigail, MD  ivabradine  (CORLANOR ) 7.5 MG TABS tablet Take 1 tablet (7.5 mg total) by mouth 2 (two) times daily with a meal. 01/29/24   Bensimhon, Toribio SAUNDERS, MD  loratadine (CLARITIN) 10 MG tablet Take 10 mg by mouth daily. [facility] brand    [provider]  metoprolol  succinate (TOPROL -XL) 50 MG 24 hr tablet Take 1 tablet by mouth twice daily 04/22/24   Bensimhon, Toribio SAUNDERS, MD  nitroGLYCERIN  (NITROSTAT ) 0.4 MG SL tablet Place 0.4 mg under the tongue every 5 (five) minutes as needed for chest pain.    [provider]  ondansetron  (ZOFRAN -ODT) 4 MG disintegrating tablet Take 1 tablet (4 mg total) by mouth every 8 (eight) hours as needed for nausea or vomiting. 05/12/24   Teressa Harrie HERO, FNP  pantoprazole  (PROTONIX ) 40 MG tablet Take 1 tablet by mouth once daily 05/10/24   Cox, Abigail, MD  Phenazopyridine HCl (AZO TABS PO) Take 1 tablet by mouth daily as needed (painful urination).    [provider]  raloxifene  (EVISTA ) 60 MG tablet Take 1 tablet by mouth once daily 01/01/24   Mosher, Kelli A,  PA-C  spironolactone  (ALDACTONE ) 25 MG tablet Take 1 tablet (25 mg total) by mouth daily. 03/03/24   CoxAbigail, MD  torsemide  (DEMADEX ) 100 MG tablet Take 0.5 tablets (50 mg total) by mouth daily. 08/01/23   Bensimhon, Toribio SAUNDERS, MD  Vonoprazan Fumarate  (VOQUEZNA ) 10 MG TABS Take 10 mg by mouth daily. 05/12/24   Teressa Harrie HERO, FNP    Allergies: Tape, Plavix [clopidogrel bisulfate], Praluent  [alirocumab ], Statins, and Tamoxifen     Review of Systems  Updated Vital Signs BP (!) 117/45 (BP Location: Left Arm)   Pulse 74   Temp (!) 97.5 F (36.4 C)   Resp 17   Ht 5' 5 (1.651  m)   Wt 92.5 kg   SpO2 97%   BMI 33.95 kg/m   Physical Exam Vitals and nursing note reviewed.  Constitutional:      General: She is not in acute distress.    Appearance: She is well-developed.  HENT:     Head: Normocephalic and atraumatic.     Right Ear: External ear normal.     Left Ear: External ear normal.     Nose: Nose normal.   Eyes:     Conjunctiva/sclera: Conjunctivae normal.    Cardiovascular:     Rate and Rhythm: Normal rate and regular rhythm.     Heart sounds: No murmur heard. Pulmonary:     Effort: No respiratory distress.     Breath sounds: No wheezing, rhonchi or rales.  Abdominal:     Palpations: Abdomen is soft.     Tenderness: There is abdominal tenderness in the right upper quadrant and epigastric area. There is no guarding or rebound.   Musculoskeletal:     Cervical back: Normal range of motion and neck supple.     Right lower leg: No edema.     Left lower leg: No edema.   Skin:    General: Skin is warm and dry.     Findings: No rash.   Neurological:     General: No focal deficit present.     Mental Status: She is alert. Mental status is at baseline.     Motor: No weakness.   Psychiatric:        Mood and Affect: Mood normal.     (all labs ordered are listed, but only abnormal results are displayed) Labs Reviewed  I-STAT CG4 LACTIC ACID, ED - Abnormal; Notable for the following components:      Result Value   Lactic Acid, Venous 2.9 (*)    All other components within normal limits  CBC WITH DIFFERENTIAL/PLATELET  COMPREHENSIVE METABOLIC PANEL WITH GFR  LIPASE, BLOOD  URINALYSIS, ROUTINE W REFLEX MICROSCOPIC    EKG: None  Radiology: CT ABDOMEN PELVIS W CONTRAST Result Date: 05/14/2024 CLINICAL DATA:  Right upper quadrant abdominal pain. EXAM: CT ABDOMEN AND PELVIS WITH CONTRAST TECHNIQUE: Multidetector CT imaging of the abdomen and pelvis was performed using the standard protocol following bolus administration of intravenous  contrast. RADIATION DOSE REDUCTION: This exam was performed according to the departmental dose-optimization program which includes automated exposure control, adjustment of the mA and/or kV according to patient size and/or use of iterative reconstruction technique. CONTRAST:  OMNIPAQUE  IOHEXOL  300 MG/ML  SOLN COMPARISON:  11/19/2016 FINDINGS: Lower chest: No acute abnormality. Hepatobiliary: Heterogeneous enhancement on the arterial phase images noted. On the portal venous phase images there is a uniform enhancement pattern of the liver without suspicious liver abnormality. Several subcentimeter low-density lesions are identified which are technically too  small to reliably characterize but favored to represent small cysts. Index lesion within segment 8/4 measures 7 mm, image 22/301. Gallbladder wall enhancement with mild thickening and pericholecystic soft tissue stranding noted. No calcified gallstones identified. There is fusiform dilatation of the common bile duct which measures 1.0 cm, image 115/601. There is mild to moderate intrahepatic bile duct dilatation. No calcified stones identified along the course of the ureter. No mass identified. Pancreas: Unremarkable. No pancreatic ductal dilatation or surrounding inflammatory changes. Spleen: Small low-density structure within the spleen measures 6 mm, image 37/310. Spleen otherwise normal. Adrenals/Urinary Tract: There is nodular thickening of both adrenal glands, unchanged from 11/19/2016 compatible with underlying adenomas. No follow-up imaging recommended. No kidney mass or obstructive uropathy. Contrast is identified within the collecting systems of both kidneys an ureters. Urinary bladder is within normal limits. Stomach/Bowel: Stomach is normal. The appendix is visualized and is normal. Diffuse colonic diverticulosis is most severe at the level of the sigmoid colon. Mild wall thickening with subtle adjacent fat stranding is identified involving the  mid sigmoid colon, image 136/301. These findings may reflect sequelae of chronic diverticular disease. Uncomplicated acute diverticulitis not excluded. Vascular/Lymphatic: Aortic atherosclerosis. No enlarged abdominal or pelvic lymph nodes. Reproductive: Status post hysterectomy. No adnexal masses. Other: No free fluid or fluid collections. No signs of pneumoperitoneum. Musculoskeletal: No acute or significant osseous findings. IMPRESSION: 1. Gallbladder wall enhancement with mild thickening and pericholecystic soft tissue stranding noted. No calcified gallstones identified. Findings are concerning for acute cholecystitis. 2. Fusiform dilatation of the common bile duct which measures 1.0 cm. There is mild to moderate intrahepatic bile duct dilatation. No calcified stones identified along the course of the ureter. No mass identified. Consider further evaluation with MRCP. 3. Diffuse colonic diverticulosis is most severe at the level of the sigmoid colon. Mild wall thickening with subtle adjacent fat stranding is identified involving the mid sigmoid colon. These findings may reflect sequelae of chronic diverticular disease. Uncomplicated mild acute diverticulitis not excluded. 4. Stable nodular thickening of both adrenal glands, unchanged from 11/19/2016 compatible with underlying adenomas. No follow-up imaging recommended. 5.  Aortic Atherosclerosis (ICD10-I70.0). Electronically Signed   By: Waddell Calk M.D.   On: 05/14/2024 11:49   US  Abdomen Limited RUQ (LIVER/GB) Result Date: 05/13/2024 CLINICAL DATA:  Right upper quadrant pain.  Nausea vomiting EXAM: ULTRASOUND ABDOMEN LIMITED RIGHT UPPER QUADRANT COMPARISON:  None Available. FINDINGS: Gallbladder: Dilated gallbladder. Dependent stones. No wall thickening or adjacent fluid. No reported Murphy's sign. Common bile duct: Diameter: 3 mm Liver: Diffusely echogenic hepatic parenchyma consistent with fatty liver infiltration. With this level of echogenicity  evaluation for underlying mass lesion is limited and if needed follow-up contrast CT or MRI as clinically appropriate. Portal vein is patent on color Doppler imaging with normal direction of blood flow towards the liver. Other: None. IMPRESSION: Fatty liver infiltration. Dilated gallbladder. Dependent stones. No further sonographic evidence of acute cholecystitis. No ductal dilatation Electronically Signed   By: Ranell Bring M.D.   On: 05/13/2024 11:31   ED Course  Patient seen and examined. History obtained directly from patient.  I reviewed outpatient PCP notes and imaging studies.  Labs/EKG: Ordered CBC, CMP, lipase, UA, lactate.  Imaging: None ordered  Medications/Fluids: Ordered: IV Zosyn, IV Dilaudid /Zofran , IV fluid bolus.  IV fluid bolus ordered due to beta lactate of 2.9.  Patient does not appear to be acutely fluid overloaded, but will have to be judicious regarding hydration given her history of CHF.  Most recent vital  signs reviewed and are as follows: BP (!) 117/45 (BP Location: Left Arm)   Pulse 74   Temp (!) 97.5 F (36.4 C)   Resp 17   Ht 5' 5 (1.651 m)   Wt 92.5 kg   SpO2 97%   BMI 33.95 kg/m   Initial impression: Acute cholecystitis  I spoken with Ludie with general surgery.  Surgery to consult.  Given patient's comorbidities, will likely need medical admission.  Signout to Henderly PA-C at shift change, pending lab work.  Plan: Admit to medical service after labs completed.     Procedures   Medications Ordered in the ED  piperacillin-tazobactam (ZOSYN) IVPB 3.375 g (3.375 g Intravenous New Bag/Given 05/14/24 1444)  sodium chloride  0.9 % bolus 1,000 mL (has no administration in time range)  HYDROmorphone  (DILAUDID ) injection 0.5 mg (0.5 mg Intravenous Given 05/14/24 1445)  ondansetron  (ZOFRAN ) injection 4 mg (4 mg Intravenous Given 05/14/24 1444)    Clinical Course as of 05/14/24 1502  Fri May 14, 2024  1501 Medicine admit once labs return. Surgery aware  [BH]    Clinical Course User Index [BH] Henderly, Britni A, PA-C                                 Medical Decision Making Risk Prescription drug management.   Patient with 4 days of right upper quadrant pain, progressive, vomiting --imaging studies suggestive of cholecystitis.     Final diagnoses:  Acute cholecystitis    ED Discharge Orders     None          Desiderio Chew, PA-C 05/14/24 1504    Pamella Ozell LABOR, DO 05/19/24 2037

## 2024-05-14 NOTE — Assessment & Plan Note (Addendum)
 Persistent abdominal pain with elevated WBC count suggests infection. Differential includes gastrointestinal infection, UTI or other abdominal pathology. Imaging and further diagnostics pending. - CT scan scheduled at 1030 am today to evaluate liver mass/lesion detected on US  - Send urine for culture to rule out infection. - Prescribe antibiotics  - Order HIDA scan for gallbladder evaluation.

## 2024-05-14 NOTE — Consult Note (Signed)
 Cardiology Consultation:   Patient ID: Allison White; [id number]; 19-Jun-1961   Admit date: 05/14/2024 Date of Consult: 05/14/2024  Primary Care Provider: Sherre Clapper, MD Primary Cardiologist: Dr. Cherrie    Patient Profile:   Allison White is a 63 y.o. female with a hx of CAD and cardiomyopathy who is being seen today for preop at the request of Dr. Judeth.  History of Present Illness:   Allison White with a history of cardiomyopathy and coronary artery disease.  [facility].  She has a history of coronary artery disease with DES stenting to the LAD in 2019.  She has been on GDMT.  She has a history of cardiomyopathy with Barostim implantation and defibrillator.  Last ejection fraction was 20 to 25% in February 2024.  She is up-to-date with device follow-up and has a paced V sensed rhythm.   She reports that she had been doing well.  The patient denies any new symptoms such as chest discomfort, neck or arm discomfort. There has been no new shortness of breath, PND or orthopnea. There have been no reported palpitations, presyncope or syncope.  She has some chronic dyspnea but is able to doggy paddle in the pool and do household chores without new symptoms.    She presents today with acute cholecystitis and we are consulted for preop.  She presented with abdominal pain starting earlier this week.   US  and CT consistent with acute cholecystitis.  White blood cells 12.2.  Elevated liver enzymes with AST of 148 and ALT of 62.  Past Medical History:  Diagnosis Date   AICD (automatic cardioverter/defibrillator) present 02/25/2017   Biotronik- Dual PPM/AICD   Arthritis    Cancer (HCC) 2022   Rt Breast   CHF (congestive heart failure) (HCC)    Coronary artery disease    Diabetes mellitus without complication (HCC)    Fibromuscular dysplasia (HCC)    FMD (facioscapulohumeral muscular dystrophy) (HCC)    Headache(784.0)    Heart failure (HCC)     Hyperlipidemia    MI (myocardial infarction) (HCC) 11/19/2016   s/p DES LAD   Migraine    Nonruptured cerebral aneurysm, internal carotid artery 07/14/2008   Left ICA stent placed for pseudoaneurysm, likely from dissection related to fibromuscular dysplasia009)   Palpitations    PONV (postoperative nausea and vomiting)    Pseudoaneurysm (HCC)    both carotids    Stroke (HCC) 06/10/2008   when carotid artery dissected, she was told she had a stroke (pt states she has had dissection on both sides); s/p left ICA stent 07/14/08   Vertigo     Past Surgical History:  Procedure Laterality Date   ABDOMINAL HYSTERECTOMY  08/16/2004   Uterus and Cervix (per pathology)   BAROREFLEX SYSTEM INSERTION  11/14/2023   BREAST LUMPECTOMY Right 07/2021   CARDIAC DEFIBRILLATOR PLACEMENT     CEREBRAL ANEURYSM REPAIR Left 2009   CESAREAN SECTION     CORONARY ANGIOPLASTY WITH STENT PLACEMENT     RIGHT/LEFT HEART CATH AND CORONARY ANGIOGRAPHY N/A 04/07/2020   Procedure: RIGHT/LEFT HEART CATH AND CORONARY ANGIOGRAPHY;  Surgeon: Cherrie Toribio SAUNDERS, MD;  Location: [facility];  Service: Cardiovascular;  Laterality: N/A;   TUBAL LIGATION       Home Medications:  Prior to Admission medications   Medication Sig Start Date End Date Taking? Authorizing Provider  aspirin 81 MG EC tablet Take 81 mg by mouth in the morning. 02/26/17  Yes  [provider]  bismuth subsalicylate (PEPTO BISMOL) 262 MG chewable tablet Chew 524 mg by mouth as needed for indigestion.   Yes [provider]  ciprofloxacin  (CIPRO ) 500 MG tablet Take 1 tablet (500 mg total) by mouth 2 (two) times daily for 7 days. 05/14/24 05/21/24 Yes Teressa Harrie HERO, FNP  dapagliflozin  propanediol (FARXIGA ) 10 MG TABS tablet Take 1 tablet (10 mg total) by mouth daily. Patient taking differently: Take 10 mg by mouth in the morning. 02/26/23  Yes Bensimhon, Toribio SAUNDERS, MD  digoxin  (LANOXIN ) 0.125 MG tablet Take 1 tablet by mouth once  daily Patient taking differently: Take 0.125 mg by mouth at bedtime. 05/23/23  Yes Bensimhon, Toribio SAUNDERS, MD  Evolocumab  (REPATHA  SURECLICK) 140 MG/ML SOAJ INJECT 140MG  INTO THE SKIN EVERY 14 DAYS Patient taking differently: Inject 140 mg into the skin every 14 (fourteen) days. 03/03/24  Yes Cox, Kirsten, MD  fenofibrate  160 MG tablet Take 1 tablet (160 mg total) by mouth daily. Patient taking differently: Take 160 mg by mouth in the morning. 05/06/24  Yes Cox, Kirsten, MD  HYDROcodone -acetaminophen  (NORCO) 7.5-325 MG tablet Take 1 tablet by mouth every 6 (six) hours as needed for moderate pain (pain score 4-6). 05/11/24  Yes Cox, Kirsten, MD  ibuprofen (ADVIL) 200 MG tablet Take 200-400 mg by mouth every 6 (six) hours as needed for moderate pain (pain score 4-6).   Yes [provider]  icosapent  Ethyl (VASCEPA ) 1 g capsule Take 2 capsules by mouth twice daily 04/15/24  Yes Cox, Kirsten, MD  ivabradine  (CORLANOR ) 7.5 MG TABS tablet Take 1 tablet (7.5 mg total) by mouth 2 (two) times daily with a meal. 01/29/24  Yes Bensimhon, Toribio SAUNDERS, MD  loratadine (CLARITIN) 10 MG tablet Take 10 mg by mouth in the morning. [facility] brand   Yes [provider]  metoprolol  succinate (TOPROL -XL) 50 MG 24 hr tablet Take 1 tablet by mouth twice daily 04/22/24  Yes Bensimhon, Toribio SAUNDERS, MD  nitroGLYCERIN  (NITROSTAT ) 0.4 MG SL tablet Place 0.4 mg under the tongue every 5 (five) minutes as needed for chest pain.   Yes [provider]  Phenazopyridine HCl (AZO TABS PO) Take 1 tablet by mouth daily as needed (painful urination).   Yes [provider]  raloxifene  (EVISTA ) 60 MG tablet Take 1 tablet by mouth once daily Patient taking differently: Take 60 mg by mouth at bedtime. 01/01/24  Yes Mosher, Kelli A, PA-C  SIMETHICONE PO Take 80 mg by mouth every 6 (six) hours as needed for flatulence.   Yes [provider]  spironolactone  (ALDACTONE ) 25 MG tablet Take 1 tablet (25 mg total) by mouth  daily. Patient taking differently: Take 25 mg by mouth in the morning. 03/03/24  Yes Cox, Kirsten, MD  torsemide  (DEMADEX ) 100 MG tablet Take 0.5 tablets (50 mg total) by mouth daily. Patient taking differently: Take 50 mg by mouth in the morning. 08/01/23  Yes Bensimhon, Toribio SAUNDERS, MD  Vonoprazan Fumarate  (VOQUEZNA ) 10 MG TABS Take 10 mg by mouth daily. Patient taking differently: Take 10 mg by mouth in the morning. 05/12/24  Yes Teressa Harrie HERO, FNP    Inpatient Medications: Scheduled Meds:  Continuous Infusions:  PRN Meds:   Allergies:    Allergies  Allergen Reactions   Tape Rash    PREFERS CLOTH OR NOTHING   Plavix [Clopidogrel Bisulfate] Other (See Comments)    Joint pain   Praluent  [Alirocumab ]     Headaches, gastrointestinal issues   Statins  Myalgia, neck pain   Tamoxifen  Nausea And Vomiting    Social History:   Social History   Socioeconomic History   Marital status: Married    Spouse name: Tim   Number of children: 4   Years of education: Not on file   Highest education level: High school graduate  Occupational History   Not on file  Tobacco Use   Smoking status: Former    Current packs/day: 0.00    Average packs/day: 1 pack/day for 6.0 years (6.0 ttl pk-yrs)    Types: Cigarettes    Quit date: 28    Years since quitting: 42.5   Smokeless tobacco: Never   Tobacco comments:    Quit over 30 years ago  Vaping Use   Vaping status: Never Used  Substance and Sexual Activity   Alcohol use: Not Currently   Drug use: No   Sexual activity: Not Currently  Other Topics Concern   Not on file  Social History Narrative   Lives with husband in a one-story home.     Right handed   Caffeine: very little   Social Drivers of Corporate investment banker Strain: Low Risk  (03/18/2024)   Overall Financial Resource Strain (CARDIA)    Difficulty of Paying Living Expenses: Not hard at all  Food Insecurity: No Food Insecurity (03/18/2024)   Hunger Vital Sign     Worried About Running Out of Food in the Last Year: Never true    Ran Out of Food in the Last Year: Never true  Transportation Needs: No Transportation Needs (03/18/2024)   PRAPARE - Administrator, Civil Service (Medical): No    Lack of Transportation (Non-Medical): No  Physical Activity: Sufficiently Active (03/18/2024)   Exercise Vital Sign    Days of Exercise per Week: 5 days    Minutes of Exercise per Session: 30 min  Stress: No Stress Concern Present (03/18/2024)   [employer] of Occupational Health - Occupational Stress Questionnaire    Feeling of Stress : Not at all  Social Connections: Moderately Integrated (03/18/2024)   Social Connection and Isolation Panel    Frequency of Communication with Friends and Family: More than three times a week    Frequency of Social Gatherings with Friends and Family: Three times a week    Attends Religious Services: More than 4 times per year    Active Member of Clubs or Organizations: No    Attends Banker Meetings: Never    Marital Status: Married  Catering manager Violence: Not At Risk (03/18/2024)   Humiliation, Afraid, Rape, and Kick questionnaire    Fear of Current or Ex-Partner: No    Emotionally Abused: No    Physically Abused: No    Sexually Abused: No    Family History:    Family History  Problem Relation Age of Onset   Thyroid  disease Mother    Gastric cancer Mother 41       w/ signet ring features   Prostate cancer Father        d. 37   Hypertension Brother    Breast cancer Maternal Aunt 70   Brain cancer Maternal Aunt 27   Prostate cancer Paternal Uncle        x2 pat uncles   Prostate cancer Maternal Grandfather        metastatic; d. 55   Cerebral aneurysm Paternal Grandmother        Nonruptured   Dementia Paternal Grandfather  Prostate cancer Paternal Grandfather        metastatic     ROS:  Please see the history of present illness.   All other ROS reviewed and negative.     Physical  Exam/Data:   Vitals:   05/14/24 1343 05/14/24 1358 05/14/24 1515  BP: (!) 117/45  111/69  Pulse: 74  66  Resp: 17  18  Temp: (!) 97.5 F (36.4 C)    SpO2: 97%  98%  Weight:  92.5 kg   Height:  5' 5 (1.651 m)    No intake or output data in the 24 hours ending 05/14/24 1750 Filed Weights   05/14/24 1358  Weight: 92.5 kg   Body mass index is 33.95 kg/m.  GENERAL:  Well appearing HEENT:   Pupils equal round and reactive, fundi not visualized, oral mucosa unremarkable NECK:  No  jugular venous distention, waveform within normal limits, carotid upstroke brisk and symmetric, no bruits, no thyromegaly LYMPHATICS:  No cervical, inguinal adenopathy LUNGS:   Clear to auscultation bilaterally BACK:  No CVA tenderness CHEST:   Unremarkable HEART:  PMI not displaced or sustained,S1 and S2 within normal limits, no S3, no S4, no clicks, no rubs, no murmurs ABD:  Flat, positive bowel sounds normal in frequency in pitch, no bruits, no rebound, no guarding, no midline pulsatile mass, no hepatomegaly, no splenomegaly EXT:  2 plus pulses throughout, no edema, no cyanosis no clubbing SKIN:  No rashes no nodules NEURO:   Cranial nerves II through XII grossly intact, motor grossly intact throughout PSYCH:    Cognitively intact, oriented to person place and time   EKG:  The EKG was personally reviewed and demonstrates:   NSR, IVCD, LAD, artifact related to Barostim Telemetry:  Telemetry was personally reviewed and demonstrates:  NSR  Relevant CV Studies: Echo pending  Laboratory Data:  Chemistry Recent Labs  Lab 05/12/24 1114 05/14/24 1430  NA 140 139  K 3.7 3.8  CL 95* 101  CO2 24 25  GLUCOSE 144* 125*  BUN 12 13  CREATININE 0.97 0.97  CALCIUM  10.4* 9.5  GFRNONAA  --  >60  ANIONGAP  --  13    Recent Labs  Lab 05/12/24 1114 05/14/24 1430  PROT 6.9 7.2  ALBUMIN 4.3 3.9  AST 15 148*  ALT 18 62*  ALKPHOS 93 105  BILITOT 0.7 1.4*   Hematology Recent Labs  Lab  05/12/24 1114 05/14/24 1430  WBC 15.1* 12.2*  RBC 5.27 5.28*  HGB 16.9* 16.9*  HCT 50.1* 48.9*  MCV 95 92.6  MCH 32.1 32.0  MCHC 33.7 34.6  RDW 12.7 12.8  PLT 292 292   Cardiac EnzymesNo results for input(s): TROPONINI in the last 168 hours. No results for input(s): TROPIPOC in the last 168 hours.  BNPNo results for input(s): BNP, PROBNP in the last 168 hours.  DDimer No results for input(s): DDIMER in the last 168 hours.  Radiology/Studies:  CT ABDOMEN PELVIS W CONTRAST Result Date: 05/14/2024 CLINICAL DATA:  Right upper quadrant abdominal pain. EXAM: CT ABDOMEN AND PELVIS WITH CONTRAST TECHNIQUE: Multidetector CT imaging of the abdomen and pelvis was performed using the standard protocol following bolus administration of intravenous contrast. RADIATION DOSE REDUCTION: This exam was performed according to the departmental dose-optimization program which includes automated exposure control, adjustment of the mA and/or kV according to patient size and/or use of iterative reconstruction technique. CONTRAST:  OMNIPAQUE  IOHEXOL  300 MG/ML  SOLN COMPARISON:  11/19/2016 FINDINGS: Lower chest: No acute  abnormality. Hepatobiliary: Heterogeneous enhancement on the arterial phase images noted. On the portal venous phase images there is a uniform enhancement pattern of the liver without suspicious liver abnormality. Several subcentimeter low-density lesions are identified which are technically too small to reliably characterize but favored to represent small cysts. Index lesion within segment 8/4 measures 7 mm, image 22/301. Gallbladder wall enhancement with mild thickening and pericholecystic soft tissue stranding noted. No calcified gallstones identified. There is fusiform dilatation of the common bile duct which measures 1.0 cm, image 115/601. There is mild to moderate intrahepatic bile duct dilatation. No calcified stones identified along the course of the ureter. No mass identified.  Pancreas: Unremarkable. No pancreatic ductal dilatation or surrounding inflammatory changes. Spleen: Small low-density structure within the spleen measures 6 mm, image 37/310. Spleen otherwise normal. Adrenals/Urinary Tract: There is nodular thickening of both adrenal glands, unchanged from 11/19/2016 compatible with underlying adenomas. No follow-up imaging recommended. No kidney mass or obstructive uropathy. Contrast is identified within the collecting systems of both kidneys an ureters. Urinary bladder is within normal limits. Stomach/Bowel: Stomach is normal. The appendix is visualized and is normal. Diffuse colonic diverticulosis is most severe at the level of the sigmoid colon. Mild wall thickening with subtle adjacent fat stranding is identified involving the mid sigmoid colon, image 136/301. These findings may reflect sequelae of chronic diverticular disease. Uncomplicated acute diverticulitis not excluded. Vascular/Lymphatic: Aortic atherosclerosis. No enlarged abdominal or pelvic lymph nodes. Reproductive: Status post hysterectomy. No adnexal masses. Other: No free fluid or fluid collections. No signs of pneumoperitoneum. Musculoskeletal: No acute or significant osseous findings. IMPRESSION: 1. Gallbladder wall enhancement with mild thickening and pericholecystic soft tissue stranding noted. No calcified gallstones identified. Findings are concerning for acute cholecystitis. 2. Fusiform dilatation of the common bile duct which measures 1.0 cm. There is mild to moderate intrahepatic bile duct dilatation. No calcified stones identified along the course of the ureter. No mass identified. Consider further evaluation with MRCP. 3. Diffuse colonic diverticulosis is most severe at the level of the sigmoid colon. Mild wall thickening with subtle adjacent fat stranding is identified involving the mid sigmoid colon. These findings may reflect sequelae of chronic diverticular disease. Uncomplicated mild acute  diverticulitis not excluded. 4. Stable nodular thickening of both adrenal glands, unchanged from 11/19/2016 compatible with underlying adenomas. No follow-up imaging recommended. 5.  Aortic Atherosclerosis (ICD10-I70.0). Electronically Signed   By: Waddell Calk M.D.   On: 05/14/2024 11:49   US  Abdomen Limited RUQ (LIVER/GB) Result Date: 05/13/2024 CLINICAL DATA:  Right upper quadrant pain.  Nausea vomiting EXAM: ULTRASOUND ABDOMEN LIMITED RIGHT UPPER QUADRANT COMPARISON:  None Available. FINDINGS: Gallbladder: Dilated gallbladder. Dependent stones. No wall thickening or adjacent fluid. No reported Murphy's sign. Common bile duct: Diameter: 3 mm Liver: Diffusely echogenic hepatic parenchyma consistent with fatty liver infiltration. With this level of echogenicity evaluation for underlying mass lesion is limited and if needed follow-up contrast CT or MRI as clinically appropriate. Portal vein is patent on color Doppler imaging with normal direction of blood flow towards the liver. Other: None. IMPRESSION: Fatty liver infiltration. Dilated gallbladder. Dependent stones. No further sonographic evidence of acute cholecystitis. No ductal dilatation Electronically Signed   By: Ranell Bring M.D.   On: 05/13/2024 11:31    Assessment and Plan:   Preop:   Her preoperative risk score is elevated because of her chronic disease (15% risk of major cardiac event.   No preop testing is indicated per ACC/AHA guidelines.  Discussed with patient.  She  thankfully has a good functional level and planned surgery is not high risk.    Chronic systolic heart failure:  She seems to be euvolemic.  Continue current therapy.  Follow volume closely this admission.   Coronary artery disease:   No acute symptoms.  No change in therapy.    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   For questions or updates, please contact [facility] Please consult [email or web address] for contact info under  Cardiology/STEMI.   Signed, Lynwood Schilling, MD  05/14/2024 5:50 PM

## 2024-05-14 NOTE — H&P (Addendum)
 History and Physical    Allison White FMW:[id number] DOB: 09-16-1961 DOA: 05/14/2024  PCP: Allison Clapper, MD   I have briefly reviewed patients previous medical reports in [facility].  Patient coming from: Home  Chief Complaint: Abdominal pain  HPI: Allison White is a 63 year old married female, lives in a 1 level house, medical history significant for extensive cardiac history including CAD, STEMI 11/19/2016 s/p DES to LAD and diagonal bifurcation, ICM, chronic systolic CHF, Biotronik defibrillator and Barostim implantation, breast cancer s/p lumpectomy x 2 and radiation of her left breast, HLD, HTN, PAD, presented to the ED with complaints of abdominal pain.  Patient was in usual state of health until 6/23 when she started experiencing heartburn and indicates her epigastric region.  She does have history of intermittent heartburn for which she takes OTC meds.  Therefore she tried multiple OTC meds including Gas-X, Prilosec, Pepto-Bismol.  However during the course of the day, symptoms continued to worsen and she had 5-6 episodes of nonbloody emesis which subsequently changed to biliary emesis.  Next day, she continued with home therapy as above with ongoing epigastric abdominal pain, nausea and vomiting.  By 6/25, since she was not feeling better, she decided to go to see her PCP.  However on this day she had just 1 episode of vomiting and her epigastric abdominal pain was better.  She started having once a day dark stools which she attributes to the Pepto-Bismol.  PCP sent her for an abdominal ultrasound which was done yesterday 6/26 which was abnormal.  Today she went back to see her PCP who then obtained a CT abdomen which was concerning for acute cholecystitis and patient was advised to come to the [facility] ED since her cardiologist also works out of here.  Of note, since Wednesday, she has not had any vomiting, has had intermittent nausea, epigastric abdominal pain has since changed  to pain across upper anterior abdomen.  This is also associated with some back pain.  Despite above, she has been able to take her prescription meds.  During this course, there have been no worsening or change in her cardiorespiratory symptoms.  She has her baseline dyspnea on mild exertion, her leg edema is actually better, unable to lie fully flat more so from abdominal pain rather than dyspnea.  She denies chest pain.  ED Course: Afebrile and other vital signs stable.  CMP only remarkable for AST of 148, ALT of 62 and total bilirubin of 1.4, these were normal on 6/25.  Venous lactate 2.9 > 1.9 after an IV fluid bolus.  CBC shows WBC of 12.2, hemoglobin 16.9.  Urine microscopy not suggestive of a UTI especially in the absence of symptoms  RUQ ultrasound on 6/26: Fatty liver infiltration.  Dilated gallbladder.  Dependent stones.  No further sonographic evidence of acute cholecystitis.  No ductal dilatation.  CT A/P with contrast 6/27: Gallbladder wall enhancement with mild thickening and pericholecystic soft tissue stranding noted.  No calcified gallstones identified.  Findings concerning for acute cholecystitis.  Fusiform dilatation of CBD which measures 1 cm.  Mild to moderate intrahepatic bile duct dilatation.  No calcified stones identified along the course of the ureter.  No mass identified.  Diffuse colonic diverticulosis, most severe at level of sigmoid colon.  Although uncomplicated mild acute diverticulitis not excluded is mention, clinically she does not have this.  Since hospital arrival, she received a liter of IV fluid bolus, a dose of IV Zosyn, IV Zofran  and IV  Dilaudid .  Hospitalist admission was requested.  Review of Systems:  All other systems reviewed and apart from HPI, are negative.  Past Medical History:  Diagnosis Date   AICD (automatic cardioverter/defibrillator) present 02/25/2017   Biotronik- Dual PPM/AICD   Arthritis    Cancer (HCC) 2022   Rt Breast   CHF (congestive  heart failure) (HCC)    Coronary artery disease    Diabetes mellitus without complication (HCC)    Fibromuscular dysplasia (HCC)    FMD (facioscapulohumeral muscular dystrophy) (HCC)    Headache(784.0)    Heart failure (HCC)    Hyperlipidemia    MI (myocardial infarction) (HCC) 11/19/2016   s/p DES LAD   Migraine    Nonruptured cerebral aneurysm, internal carotid artery 07/14/2008   Left ICA stent placed for pseudoaneurysm, likely from dissection related to fibromuscular dysplasia009)   Palpitations    PONV (postoperative nausea and vomiting)    Pseudoaneurysm (HCC)    both carotids    Stroke (HCC) 06/10/2008   when carotid artery dissected, she was told she had a stroke (pt states she has had dissection on both sides); s/p left ICA stent 07/14/08   Vertigo     Past Surgical History:  Procedure Laterality Date   ABDOMINAL HYSTERECTOMY  08/16/2004   Uterus and Cervix (per pathology)   BAROREFLEX SYSTEM INSERTION  11/14/2023   BREAST LUMPECTOMY Right 07/2021   CARDIAC DEFIBRILLATOR PLACEMENT     CEREBRAL ANEURYSM REPAIR Left 2009   CESAREAN SECTION     CORONARY ANGIOPLASTY WITH STENT PLACEMENT     RIGHT/LEFT HEART CATH AND CORONARY ANGIOGRAPHY N/A 04/07/2020   Procedure: RIGHT/LEFT HEART CATH AND CORONARY ANGIOGRAPHY;  Surgeon: Cherrie Toribio SAUNDERS, MD;  Location: [facility] INVASIVE CV LAB;  Service: Cardiovascular;  Laterality: N/A;   TUBAL LIGATION      Social History  reports that she quit smoking about 42 years ago. Her smoking use included cigarettes. She has a 6 pack-year smoking history. She has never used smokeless tobacco. She reports that she does not currently use alcohol. She reports that she does not use drugs.  Allergies  Allergen Reactions   Tape Rash    PREFERS CLOTH OR NOTHING   Plavix [Clopidogrel Bisulfate] Other (See Comments)    Joint pain   Praluent  [Alirocumab ]     Headaches, gastrointestinal issues   Statins     Myalgia, neck pain   Tamoxifen  Nausea And  Vomiting    Family History  Problem Relation Age of Onset   Thyroid  disease Mother    Gastric cancer Mother 67       w/ signet ring features   Prostate cancer Father        d. 39   Hypertension Brother    Breast cancer Maternal Aunt 92   Brain cancer Maternal Aunt 76   Prostate cancer Paternal Uncle        x2 pat uncles   Prostate cancer Maternal Grandfather        metastatic; d. 36   Cerebral aneurysm Paternal Grandmother        Nonruptured   Dementia Paternal Grandfather    Prostate cancer Paternal Grandfather        metastatic     Prior to Admission medications   Medication Sig Start Date End Date Taking? Authorizing Provider  aspirin 81 MG EC tablet Take 81 mg by mouth in the morning. 02/26/17  Yes [provider]  bismuth subsalicylate (PEPTO BISMOL) 262 MG chewable tablet Chew 524 mg  by mouth as needed for indigestion.   Yes [provider]  ciprofloxacin  (CIPRO ) 500 MG tablet Take 1 tablet (500 mg total) by mouth 2 (two) times daily for 7 days. 05/14/24 05/21/24 Yes Teressa Harrie HERO, FNP  dapagliflozin  propanediol (FARXIGA ) 10 MG TABS tablet Take 1 tablet (10 mg total) by mouth daily. Patient taking differently: Take 10 mg by mouth in the morning. 02/26/23  Yes Bensimhon, Toribio SAUNDERS, MD  digoxin  (LANOXIN ) 0.125 MG tablet Take 1 tablet by mouth once daily Patient taking differently: Take 0.125 mg by mouth at bedtime. 05/23/23  Yes Bensimhon, Toribio SAUNDERS, MD  Evolocumab  (REPATHA  SURECLICK) 140 MG/ML SOAJ INJECT 140MG  INTO THE SKIN EVERY 14 DAYS Patient taking differently: Inject 140 mg into the skin every 14 (fourteen) days. 03/03/24  Yes Cox, Kirsten, MD  fenofibrate  160 MG tablet Take 1 tablet (160 mg total) by mouth daily. Patient taking differently: Take 160 mg by mouth in the morning. 05/06/24  Yes Cox, Kirsten, MD  HYDROcodone -acetaminophen  (NORCO) 7.5-325 MG tablet Take 1 tablet by mouth every 6 (six) hours as needed for moderate pain (pain score 4-6). 05/11/24  Yes  Cox, Kirsten, MD  ibuprofen (ADVIL) 200 MG tablet Take 200-400 mg by mouth every 6 (six) hours as needed for moderate pain (pain score 4-6).   Yes [provider]  icosapent  Ethyl (VASCEPA ) 1 g capsule Take 2 capsules by mouth twice daily 04/15/24  Yes Cox, Kirsten, MD  ivabradine  (CORLANOR ) 7.5 MG TABS tablet Take 1 tablet (7.5 mg total) by mouth 2 (two) times daily with a meal. 01/29/24  Yes Bensimhon, Toribio SAUNDERS, MD  loratadine (CLARITIN) 10 MG tablet Take 10 mg by mouth in the morning. [facility] brand   Yes [provider]  metoprolol  succinate (TOPROL -XL) 50 MG 24 hr tablet Take 1 tablet by mouth twice daily 04/22/24  Yes Bensimhon, Toribio SAUNDERS, MD  nitroGLYCERIN  (NITROSTAT ) 0.4 MG SL tablet Place 0.4 mg under the tongue every 5 (five) minutes as needed for chest pain.   Yes [provider]  Phenazopyridine HCl (AZO TABS PO) Take 1 tablet by mouth daily as needed (painful urination).   Yes [provider]  raloxifene  (EVISTA ) 60 MG tablet Take 1 tablet by mouth once daily Patient taking differently: Take 60 mg by mouth at bedtime. 01/01/24  Yes Mosher, Kelli A, PA-C  SIMETHICONE PO Take 80 mg by mouth every 6 (six) hours as needed for flatulence.   Yes [provider]  spironolactone  (ALDACTONE ) 25 MG tablet Take 1 tablet (25 mg total) by mouth daily. Patient taking differently: Take 25 mg by mouth in the morning. 03/03/24  Yes Cox, Kirsten, MD  torsemide  (DEMADEX ) 100 MG tablet Take 0.5 tablets (50 mg total) by mouth daily. Patient taking differently: Take 50 mg by mouth in the morning. 08/01/23  Yes Bensimhon, Toribio SAUNDERS, MD  Vonoprazan Fumarate  (VOQUEZNA ) 10 MG TABS Take 10 mg by mouth daily. Patient taking differently: Take 10 mg by mouth in the morning. 05/12/24  Yes Teressa Harrie HERO, FNP    Physical Exam: Vitals:   05/14/24 1358 05/14/24 1515 05/14/24 1757 05/14/24 1805  BP:  111/69    Pulse:  66 70   Resp:  18 12   Temp:    (!) 97.5 F (36.4 C)   TempSrc:    Oral  SpO2:  98% 100%   Weight: 92.5 kg     Height: 5' 5 (1.651 m)         Constitutional:  Middle-age female, moderately built and nourished sitting up comfortably in bed without distress.  Does not look septic or toxic. Eyes: PERTLA, lids and conjunctivae normal ENMT: Mucous membranes are moist. Posterior pharynx clear of any exudate or lesions. Normal dentition.  Neck: supple, no masses, no thyromegaly Respiratory: Clear to auscultation without wheezing, rhonchi or crackles. No increased work of breathing.  ICD left upper anterior chest, Barostim right upper anterior chest. Cardiovascular: S1 & S2 heard, regular rate and rhythm. No JVD, murmurs, rubs or clicks.  Trace bilateral ankle edema.  Telemetry personally reviewed at bedside: Sinus rhythm. Abdomen: Nondistended.  Epigastric tenderness.  Most tenderness in right upper quadrant/positive Murphy sign.  No peritoneal signs.  No organomegaly or masses appreciated.  Normal bowel sounds heard. Musculoskeletal: no clubbing / cyanosis. No joint deformity upper and lower extremities. Good ROM, no contractures. Normal muscle tone.  Skin: no rashes, lesions, ulcers. No induration Neurologic: CN 2-12 grossly intact. Sensation intact, DTR normal. Strength 5/5 in all 4 limbs.  Psychiatric: Normal judgment and insight. Alert and oriented x 3. Normal mood.     Labs on Admission: I have personally reviewed following labs and imaging studies  CBC: Recent Labs  Lab 05/12/24 1114 05/14/24 1430  WBC 15.1* 12.2*  NEUTROABS 11.0* 7.5  HGB 16.9* 16.9*  HCT 50.1* 48.9*  MCV 95 92.6  PLT 292 292    Basic Metabolic Panel: Recent Labs  Lab 05/12/24 1114 05/14/24 1430  NA 140 139  K 3.7 3.8  CL 95* 101  CO2 24 25  GLUCOSE 144* 125*  BUN 12 13  CREATININE 0.97 0.97  CALCIUM  10.4* 9.5    Liver Function Tests: Recent Labs  Lab 05/12/24 1114 05/14/24 1430  AST 15 148*  ALT 18 62*  ALKPHOS 93 105  BILITOT 0.7 1.4*   PROT 6.9 7.2  ALBUMIN 4.3 3.9    Urine analysis:    Component Value Date/Time   COLORURINE STRAW (A) 05/14/2024 1415   APPEARANCEUR CLEAR 05/14/2024 1415   LABSPEC 1.020 05/14/2024 1415   PHURINE 5.0 05/14/2024 1415   GLUCOSEU 150 (A) 05/14/2024 1415   HGBUR NEGATIVE 05/14/2024 1415   BILIRUBINUR NEGATIVE 05/14/2024 1415   BILIRUBINUR negative 05/14/2024 0937   KETONESUR NEGATIVE 05/14/2024 1415   PROTEINUR NEGATIVE 05/14/2024 1415   UROBILINOGEN 0.2 05/14/2024 0937   UROBILINOGEN 0.2 06/10/2008 2239   NITRITE NEGATIVE 05/14/2024 1415   LEUKOCYTESUR SMALL (A) 05/14/2024 1415     Radiological Exams on Admission: CT ABDOMEN PELVIS W CONTRAST Result Date: 05/14/2024 CLINICAL DATA:  Right upper quadrant abdominal pain. EXAM: CT ABDOMEN AND PELVIS WITH CONTRAST TECHNIQUE: Multidetector CT imaging of the abdomen and pelvis was performed using the standard protocol following bolus administration of intravenous contrast. RADIATION DOSE REDUCTION: This exam was performed according to the departmental dose-optimization program which includes automated exposure control, adjustment of the mA and/or kV according to patient size and/or use of iterative reconstruction technique. CONTRAST:  OMNIPAQUE  IOHEXOL  300 MG/ML  SOLN COMPARISON:  11/19/2016 FINDINGS: Lower chest: No acute abnormality. Hepatobiliary: Heterogeneous enhancement on the arterial phase images noted. On the portal venous phase images there is a uniform enhancement pattern of the liver without suspicious liver abnormality. Several subcentimeter low-density lesions are identified which are technically too small to reliably characterize but favored to represent small cysts. Index lesion within segment 8/4 measures 7 mm, image 22/301. Gallbladder wall enhancement with mild thickening and pericholecystic soft tissue stranding noted. No calcified gallstones identified. There is fusiform dilatation  of the common bile duct which measures  1.0 cm, image 115/601. There is mild to moderate intrahepatic bile duct dilatation. No calcified stones identified along the course of the ureter. No mass identified. Pancreas: Unremarkable. No pancreatic ductal dilatation or surrounding inflammatory changes. Spleen: Small low-density structure within the spleen measures 6 mm, image 37/310. Spleen otherwise normal. Adrenals/Urinary Tract: There is nodular thickening of both adrenal glands, unchanged from 11/19/2016 compatible with underlying adenomas. No follow-up imaging recommended. No kidney mass or obstructive uropathy. Contrast is identified within the collecting systems of both kidneys an ureters. Urinary bladder is within normal limits. Stomach/Bowel: Stomach is normal. The appendix is visualized and is normal. Diffuse colonic diverticulosis is most severe at the level of the sigmoid colon. Mild wall thickening with subtle adjacent fat stranding is identified involving the mid sigmoid colon, image 136/301. These findings may reflect sequelae of chronic diverticular disease. Uncomplicated acute diverticulitis not excluded. Vascular/Lymphatic: Aortic atherosclerosis. No enlarged abdominal or pelvic lymph nodes. Reproductive: Status post hysterectomy. No adnexal masses. Other: No free fluid or fluid collections. No signs of pneumoperitoneum. Musculoskeletal: No acute or significant osseous findings. IMPRESSION: 1. Gallbladder wall enhancement with mild thickening and pericholecystic soft tissue stranding noted. No calcified gallstones identified. Findings are concerning for acute cholecystitis. 2. Fusiform dilatation of the common bile duct which measures 1.0 cm. There is mild to moderate intrahepatic bile duct dilatation. No calcified stones identified along the course of the ureter. No mass identified. Consider further evaluation with MRCP. 3. Diffuse colonic diverticulosis is most severe at the level of the sigmoid colon. Mild wall thickening with subtle  adjacent fat stranding is identified involving the mid sigmoid colon. These findings may reflect sequelae of chronic diverticular disease. Uncomplicated mild acute diverticulitis not excluded. 4. Stable nodular thickening of both adrenal glands, unchanged from 11/19/2016 compatible with underlying adenomas. No follow-up imaging recommended. 5.  Aortic Atherosclerosis (ICD10-I70.0). Electronically Signed   By: Waddell Calk M.D.   On: 05/14/2024 11:49   US  Abdomen Limited RUQ (LIVER/GB) Result Date: 05/13/2024 CLINICAL DATA:  Right upper quadrant pain.  Nausea vomiting EXAM: ULTRASOUND ABDOMEN LIMITED RIGHT UPPER QUADRANT COMPARISON:  None Available. FINDINGS: Gallbladder: Dilated gallbladder. Dependent stones. No wall thickening or adjacent fluid. No reported Murphy's sign. Common bile duct: Diameter: 3 mm Liver: Diffusely echogenic hepatic parenchyma consistent with fatty liver infiltration. With this level of echogenicity evaluation for underlying mass lesion is limited and if needed follow-up contrast CT or MRI as clinically appropriate. Portal vein is patent on color Doppler imaging with normal direction of blood flow towards the liver. Other: None. IMPRESSION: Fatty liver infiltration. Dilated gallbladder. Dependent stones. No further sonographic evidence of acute cholecystitis. No ductal dilatation Electronically Signed   By: Ranell Bring M.D.   On: 05/13/2024 11:31    EKG: Independently reviewed. Baseline tremor artifacts.  Sinus rhythm at 66 bpm, LAD/LAFB, Q waves in leads V1 to V3 with slow R wave progression.  No acute findings.  QTc 438 ms.  Assessment/Plan Principal Problem:   Acute cholecystitis Active Problems:   Hyperlipidemia   Chronic systolic congestive heart failure (HCC)   Coronary atherosclerosis of native coronary artery   Hypertension   Gastroesophageal reflux disease without esophagitis   Acute cholecystitis suspected General surgery consultation appreciated.  As per  recommendations, clear liquids now and n.p.o. after midnight. Patient appears to be optimized from cardiorespiratory standpoint to proceed with laparoscopic cholecystectomy and should not need any further cardiac evaluation. Having said that, cardiology has been  consulted for preop clearance.  Communicated with Dr. Lavona She should be at moderate risk for adverse CV events, recommend close perioperative monitoring, in and out even.  Chronic systolic CHF/ICM/ICD/Barostim Compensated Continue PTA meds including Farxiga , digoxin , Corlanor , Toprol -XL, spironolactone  and torsemide . Monitor on telemetry  CAD s/p stent No anginal symptoms Continue aspirin, Toprol -XL.  Essential hypertension Controlled on above meds, continue  Dyslipidemia Continue fenofibrate , Repatha  and Vascepa   GERD Changed to IV PPI  Diet controlled DM2 A1c 6/17: 6.8. Monitor CBGs closely and can add SSI if consistently greater than 180.  Body mass index is 33.95 kg/m./Obesity Complicates care.  Outpatient follow-up.   DVT prophylaxis: Subcutaneous Lovenox Code Status: Full, confirmed with patient in the presence of her spouse at bedside. Family Communication: Spouse was at bedside through the course of entire evaluation.  Updated care and answered all questions. Disposition Plan:   Patient is from:  Home  Anticipated DC to: Home  Anticipated DC date: 6/29  Anticipated DC barriers: None   Consults called: Cardiology Admission status: Observation, cardiac telemetry  Severity of Illness: The appropriate patient status for this patient is OBSERVATION. Observation status is judged to be reasonable and necessary in order to provide the required intensity of service to ensure the patient's safety. The patient's presenting symptoms, physical exam findings, and initial radiographic and laboratory data in the context of their medical condition is felt to place them at decreased risk for further clinical  deterioration. Furthermore, it is anticipated that the patient will be medically stable for discharge from the hospital within 2 midnights of admission.     Trenda Mar MD FACP, [facility], [facility], [facility]   [facility] [place]    To contact the attending provider between 7A-7P or the covering provider during after hours 7P-7A, please log into the web site [email or web address] and access using universal [place] password for that web site. If you do not have the password, please call the hospital operator.  05/14/2024, 6:17 PM

## 2024-05-14 NOTE — Progress Notes (Unsigned)
 Subjective:  Patient ID: Allison White, female    DOB: 1961/05/24  Age: 63 y.o. MRN: [id number]  Chief Complaint  Patient presents with   Urinary Tract Infection    Discussed the use of AI scribe software for clinical note transcription with the patient, who gave verbal consent to proceed.  History of Present Illness   Allison White is a 63 year old female with diabetes who presents with abdominal pain and elevated blood glucose.  Dysuria - started yesterday - Taking AZO for symptoms - Taking Farxiga  10 mg, side effect is urinary tract infection   Abdominal pain and gastrointestinal symptoms - Persistent, severe abdominal pain - Pain worsens with deep breathing - Black stool present, attributed to recent Pepto-Bismol use - No diarrhea - No vomiting  Liver mass/lesion - incidental finding on the US  she had yesterday - RUQ pain  - Further evaluation recommended, CT with contrast  Diabetes - Elevated blood glucose levels, with two readings over 140 mg/dL - Recent hemoglobin J8r of 6.8% on May 04, 2024 - Current use of Farxiga , initially prescribed for cardiac indications  Laboratory findings - Elevated white blood cell count at 15.1  - recent nausea, vomiting and abdominal symptoms         05/04/2024    9:30 AM 03/18/2024   10:55 AM 07/10/2023   11:07 AM 03/27/2023   11:03 AM 01/23/2023   10:38 AM  Depression screen PHQ 2/9  Decreased Interest 1 0 0 2 0  Down, Depressed, Hopeless 0 0 0 0 0  PHQ - 2 Score 1 0 0 2 0  Altered sleeping 3  2 2    Tired, decreased energy 3  3 2    Change in appetite 0  2 2   Feeling bad or failure about yourself  0  0 0   Trouble concentrating 0  0 0   Moving slowly or fidgety/restless 1  2 1    Suicidal thoughts 0  0 0   PHQ-9 Score 8  9 9    Difficult doing work/chores Not difficult at all  Somewhat difficult Somewhat difficult         05/04/2024    9:30 AM  Fall Risk   Falls in the past year? 0  Number falls in past yr: 0  Injury  with Fall? 0  Risk for fall due to : No Fall Risks  Follow up Falls evaluation completed    Patient Care Team: Sherre Clapper, MD as PCP - General (Family Medicine) Inocencio Soyla Lunger, MD as PCP - Electrophysiology (Cardiology) Bensimhon, Toribio SAUNDERS, MD as PCP - Advanced Heart Failure (Cardiology) Ezzard Valaria LABOR, MD as Consulting Physician (Oncology) Jomarie Agent, MD as Consulting Physician (Radiation Oncology) Nyle Rankin POUR, [facility] (Inactive) (Pharmacist) Dolphus Carrion, MD as Consulting Physician (Interventional Radiology) Ezzard Valaria LABOR, MD as Consulting Physician (Oncology) Vannie Elsie HERO, OD (Ophthalmology) Misenheimer, Evalene, MD as Consulting Physician (Unknown Physician Specialty) Lesia Ozell Barter, PA-C as Physician Assistant (Cardiology)   Review of Systems  Constitutional:  Negative for chills, diaphoresis, fatigue and fever.  HENT:  Negative for congestion, ear pain and sinus pain.   Respiratory:  Negative for cough and shortness of breath.   Cardiovascular:  Negative for chest pain.  Gastrointestinal:  Positive for abdominal pain (RUQ). Negative for constipation, nausea and vomiting.  Genitourinary:  Positive for dysuria.  Musculoskeletal:  Negative for arthralgias.  Neurological:  Negative for weakness and headaches.  Psychiatric/Behavioral:  Negative for dysphoric mood. The patient is not  nervous/anxious.     No current facility-administered medications on file prior to visit.   Current Outpatient Medications on File Prior to Visit  Medication Sig Dispense Refill   aspirin 81 MG EC tablet Take 81 mg by mouth in the morning.     dapagliflozin  propanediol (FARXIGA ) 10 MG TABS tablet Take 1 tablet (10 mg total) by mouth daily. (Patient taking differently: Take 10 mg by mouth in the morning.) 90 tablet 3   digoxin  (LANOXIN ) 0.125 MG tablet Take 1 tablet by mouth once daily (Patient taking differently: Take 0.125 mg by mouth at bedtime.) 90 tablet 3    Evolocumab  (REPATHA  SURECLICK) 140 MG/ML SOAJ INJECT 140MG  INTO THE SKIN EVERY 14 DAYS (Patient taking differently: Inject 140 mg into the skin every 14 (fourteen) days.) 2 mL 2   fenofibrate  160 MG tablet Take 1 tablet (160 mg total) by mouth daily. (Patient taking differently: Take 160 mg by mouth in the morning.) 90 tablet 1   HYDROcodone -acetaminophen  (NORCO) 7.5-325 MG tablet Take 1 tablet by mouth every 6 (six) hours as needed for moderate pain (pain score 4-6). 30 tablet 0   ibuprofen (ADVIL) 200 MG tablet Take 200-400 mg by mouth every 6 (six) hours as needed for moderate pain (pain score 4-6).     icosapent  Ethyl (VASCEPA ) 1 g capsule Take 2 capsules by mouth twice daily 120 capsule 0   ivabradine  (CORLANOR ) 7.5 MG TABS tablet Take 1 tablet (7.5 mg total) by mouth 2 (two) times daily with a meal. 180 tablet 3   loratadine (CLARITIN) 10 MG tablet Take 10 mg by mouth in the morning. [facility] brand     metoprolol  succinate (TOPROL -XL) 50 MG 24 hr tablet Take 1 tablet by mouth twice daily 180 tablet 3   nitroGLYCERIN  (NITROSTAT ) 0.4 MG SL tablet Place 0.4 mg under the tongue every 5 (five) minutes as needed for chest pain.     Phenazopyridine HCl (AZO TABS PO) Take 1 tablet by mouth daily as needed (painful urination).     raloxifene  (EVISTA ) 60 MG tablet Take 1 tablet by mouth once daily (Patient taking differently: Take 60 mg by mouth at bedtime.) 90 tablet 3   spironolactone  (ALDACTONE ) 25 MG tablet Take 1 tablet (25 mg total) by mouth daily. (Patient taking differently: Take 25 mg by mouth in the morning.) 90 tablet 0   torsemide  (DEMADEX ) 100 MG tablet Take 0.5 tablets (50 mg total) by mouth daily. (Patient taking differently: Take 50 mg by mouth in the morning.) 45 tablet 3   Vonoprazan Fumarate  (VOQUEZNA ) 10 MG TABS Take 10 mg by mouth daily. (Patient taking differently: Take 10 mg by mouth in the morning.)     Past Medical History:  Diagnosis Date   AICD (automatic  cardioverter/defibrillator) present 02/25/2017   Biotronik- Dual PPM/AICD   Arthritis    Cancer (HCC) 2022   Rt Breast   CHF (congestive heart failure) (HCC)    Coronary artery disease    Diabetes mellitus without complication (HCC)    Pt denies having diabetes   Fibromuscular dysplasia (HCC)    FMD (facioscapulohumeral muscular dystrophy) (HCC)    Headache(784.0)    Heart failure (HCC)    Hyperlipidemia    MI (myocardial infarction) (HCC) 11/19/2016   s/p DES LAD   Migraine    Nonruptured cerebral aneurysm, internal carotid artery 07/14/2008   Left ICA stent placed for pseudoaneurysm, likely from dissection related to fibromuscular dysplasia009)   Palpitations    PONV (postoperative nausea  and vomiting)    Pseudoaneurysm (HCC)    both carotids    Stroke (HCC) 06/10/2008   when carotid artery dissected, she was told she had a stroke (pt states she has had dissection on both sides); s/p left ICA stent 07/14/08   Vertigo    Past Surgical History:  Procedure Laterality Date   ABDOMINAL HYSTERECTOMY  08/16/2004   Uterus and Cervix (per pathology)   BAROREFLEX SYSTEM INSERTION  11/14/2023   BREAST LUMPECTOMY Right 07/2021   CARDIAC DEFIBRILLATOR PLACEMENT     CEREBRAL ANEURYSM REPAIR Left 2009   CESAREAN SECTION     CORONARY ANGIOPLASTY WITH STENT PLACEMENT     RIGHT/LEFT HEART CATH AND CORONARY ANGIOGRAPHY N/A 04/07/2020   Procedure: RIGHT/LEFT HEART CATH AND CORONARY ANGIOGRAPHY;  Surgeon: Cherrie Toribio SAUNDERS, MD;  Location: [facility];  Service: Cardiovascular;  Laterality: N/A;   TUBAL LIGATION      Family History  Problem Relation Age of Onset   Thyroid  disease Mother    Gastric cancer Mother 64       w/ signet ring features   Prostate cancer Father        d. 82   Hypertension Brother    Breast cancer Maternal Aunt 65   Brain cancer Maternal Aunt 79   Prostate cancer Paternal Uncle        x2 pat uncles   Prostate cancer Maternal Grandfather         metastatic; d. 74   Cerebral aneurysm Paternal Grandmother        Nonruptured   Dementia Paternal Grandfather    Prostate cancer Paternal Grandfather        metastatic   Social History   Socioeconomic History   Marital status: Married    Spouse name: Tim   Number of children: 4   Years of education: Not on file   Highest education level: High school graduate  Occupational History   Not on file  Tobacco Use   Smoking status: Former    Current packs/day: 0.00    Average packs/day: 1 pack/day for 6.0 years (6.0 ttl pk-yrs)    Types: Cigarettes    Quit date: 34    Years since quitting: 42.5   Smokeless tobacco: Never   Tobacco comments:    Quit over 30 years ago  Vaping Use   Vaping status: Never Used  Substance and Sexual Activity   Alcohol use: Not Currently   Drug use: No   Sexual activity: Not Currently  Other Topics Concern   Not on file  Social History Narrative   L[place].     Right handed   Caffeine: very little   Social Drivers of Corporate investment banker Strain: Low Risk  (03/18/2024)   Overall Financial Resource Strain (CARDIA)    Difficulty of Paying Living Expenses: Not hard at all  Food Insecurity: No Food Insecurity (05/15/2024)   Hunger Vital Sign    Worried About Running Out of Food in the Last Year: Never true    Ran Out of Food in the Last Year: Never true  Transportation Needs: No Transportation Needs (05/15/2024)   PRAPARE - Administrator, Civil Service (Medical): No    Lack of Transportation (Non-Medical): No  Physical Activity: Sufficiently Active (03/18/2024)   Exercise Vital Sign    Days of Exercise per Week: 5 days    Minutes of Exercise per Session: 30 min  Stress: No  Stress Concern Present (03/18/2024)   [employer] of Occupational Health - Occupational Stress Questionnaire    Feeling of Stress : Not at all  Social Connections: Moderately Integrated (03/18/2024)   Social Connection and  Isolation Panel    Frequency of Communication with Friends and Family: More than three times a week    Frequency of Social Gatherings with Friends and Family: Three times a week    Attends Religious Services: More than 4 times per year    Active Member of Clubs or Organizations: No    Attends Banker Meetings: Never    Marital Status: Married    Objective:  BP 102/64   Pulse 72   Temp 97.8 F (36.6 C) (Temporal)   Resp 16   Ht 5' 5 (1.651 m)   Wt 204 lb 9.6 oz (92.8 kg)   SpO2 99%   BMI 34.05 kg/m      05/15/2024    3:00 PM 05/15/2024   12:45 PM 05/15/2024   12:30 PM  BP/Weight  Systolic BP 123 122 142  Diastolic BP 66 78 67    Physical Exam Vitals reviewed.  Constitutional:      General: She is not in acute distress.    Appearance: Normal appearance. She is obese. She is ill-appearing.   Eyes:     Conjunctiva/sclera: Conjunctivae normal.    Cardiovascular:     Rate and Rhythm: Normal rate and regular rhythm.     Heart sounds: Normal heart sounds. No murmur heard. Pulmonary:     Effort: Pulmonary effort is normal.     Breath sounds: Normal breath sounds. No wheezing.  Abdominal:     General: Bowel sounds are normal.     Palpations: Abdomen is soft.     Tenderness: There is abdominal tenderness in the right upper quadrant.   Skin:    General: Skin is warm.   Neurological:     Mental Status: She is alert. Mental status is at baseline.   Psychiatric:        Mood and Affect: Mood normal.        Behavior: Behavior normal.     Lab Results  Component Value Date   WBC 6.2 05/15/2024   HGB 14.6 05/15/2024   HCT 43.1 05/15/2024   PLT 246 05/15/2024   GLUCOSE 110 (H) 05/15/2024   CHOL 157 05/04/2024   TRIG 347 (H) 05/04/2024   HDL 47 05/04/2024   LDLCALC 57 05/04/2024   ALT 79 (H) 05/15/2024   AST 104 (H) 05/15/2024   NA 139 05/15/2024   K 3.1 (L) 05/15/2024   CL 99 05/15/2024   CREATININE 1.08 (H) 05/15/2024   BUN 12 05/15/2024   CO2  28 05/15/2024   TSH 3.720 12/24/2022   INR 1.1 11/07/2023   HGBA1C 6.8 (H) 05/04/2024      Assessment & Plan:  Dysuria Assessment & Plan: Urine dipstick shows positive for glucose. - send urine for culture - UTI suspected based on symptoms, side effect of Farxiga , and leukocytosis. - Cipro  ordered 500 mg by mouth TWICE A DAY for 7 days.   Your symptoms should gradually improve. Call if the burning worsens, you develop a fever, back pain or pelvic pain or if your symptom do not resolve after completing the antibiotic. Drink plenty of fluids Complete the full course of antibiotics even if the symptoms resolve Remember to wipe from front to back and don't hold it in! If possible, empty your bladder every 4  hours.   Orders: -     POCT URINALYSIS DIP (CLINITEK) -     Urine Culture -     Ciprofloxacin  HCl; Take 1 tablet (500 mg total) by mouth 2 (two) times daily for 7 days.  Dispense: 14 tablet; Refill: 0  Leukocytosis, unspecified type Assessment & Plan: CBC showed elevated WBC - Symptoms of RUQ abdominal pain, dysuria and nausea and vomiting shows concern for infection. - empirically treat with antibiotics and send urine for culture - CT scheduled @1030  am today Lab Results  Component Value Date   WBC 15.1 (H) 05/12/2024     Orders: -     Ciprofloxacin  HCl; Take 1 tablet (500 mg total) by mouth 2 (two) times daily for 7 days.  Dispense: 14 tablet; Refill: 0  Right upper quadrant abdominal pain Assessment & Plan: Persistent abdominal pain with elevated WBC count suggests infection. Differential includes gastrointestinal infection, UTI or other abdominal pathology. Imaging and further diagnostics pending. - CT scan scheduled at 1030 am today to evaluate liver mass/lesion detected on US  - Send urine for culture to rule out infection. - Prescribe antibiotics  - Order HIDA scan for gallbladder evaluation.   Liver mass Assessment & Plan: Incidental finding on US  from  yesterday  Liver:   Diffusely echogenic hepatic parenchyma consistent with fatty liver infiltration. With this level of echogenicity evaluation for underlying mass lesion is limited and if needed follow-up contrast CT or MRI as clinically appropriate. Portal vein is patent on color Doppler imaging with normal direction of blood flow towards the liver.   Other: None.   IMPRESSION: Fatty liver infiltration.   Dilated gallbladder. Dependent stones. No further sonographic evidence of acute cholecystitis. No ductal dilatation  - CT with contrast recommended - scheduled today at 1030 am   Type 2 diabetes mellitus with stage 3a chronic kidney disease, without long-term current use of insulin (HCC) Assessment & Plan: Diabetes Does not check blood sugar regularly A1C stable UA  showed 3+ Glucose   Lab Results  Component Value Date   HGBA1C 6.8 (H) 05/04/2024    - Continue use of Farxiga  10 mg by mouth daily, initially prescribed for cardiac indications   Class 1 obesity due to excess calories with serious comorbidity and body mass index (BMI) of 34.0 to 34.9 in adult Assessment & Plan: BMI 34.05, not at goal Improving - Continue to work on diet and exercise      Meds ordered this encounter  Medications   ciprofloxacin  (CIPRO ) 500 MG tablet    Sig: Take 1 tablet (500 mg total) by mouth 2 (two) times daily for 7 days.    Dispense:  14 tablet    Refill:  0    Orders Placed This Encounter  Procedures   Urine Culture   POCT URINALYSIS DIP (CLINITEK)           Follow-up: Return if symptoms worsen or fail to improve, for keep appt scheduled with Dr. Sherre in Sept.   I,Angela Taylor,acting as a scribe for Harrie CHRISTELLA Cedar, FNP.,have documented all relevant documentation on the behalf of Harrie CHRISTELLA Cedar, FNP,as directed by  Harrie CHRISTELLA Cedar, FNP while in the presence of Harrie CHRISTELLA Cedar, FNP.   An After Visit Summary was printed and given to the patient.  I attest that I have  reviewed this visit and agree with the plan scribed by my staff.  Harrie CHRISTELLA Cedar, FNP [facility] [phone]

## 2024-05-14 NOTE — Patient Instructions (Addendum)
  VISIT SUMMARY: Today, you were seen for persistent abdominal pain and elevated blood glucose levels. We discussed your symptoms, reviewed your recent lab results, and planned further diagnostic tests and treatments.  YOUR PLAN: -ABDOMINAL PAIN: Your persistent abdominal pain, along with an elevated white blood cell count, suggests a possible infection or other abdominal issue. We will perform a CT scan and a HIDA scan to get a clearer picture of what might be causing the pain. Additionally, we will send a urine sample for culture to rule out any infections. You have been prescribed antibiotics, which you can pick up at [facility] in [place].  -TYPE 2 DIABETES MELLITUS: You have Type 2 Diabetes, which means your blood sugar levels are higher than normal. Your recent hemoglobin A1c was 6.8%. We discussed potential new medications, including injectables like Ozempic and Mounjaro, especially considering your cardiac history. We will decide on the best treatment plan for your diabetes after we address your abdominal issues.  INSTRUCTIONS: Please follow up with the imaging tests (CT scan and HIDA scan) as soon as possible. Pick up your prescribed antibiotics from [facility] in [place]. We will discuss your diabetes treatment options in more detail once we have more information about your abdominal pain.                                                  Contains text generated by [employer]bridge.

## 2024-05-14 NOTE — Assessment & Plan Note (Signed)
 Incidental finding on US  from yesterday  Liver:   Diffusely echogenic hepatic parenchyma consistent with fatty liver infiltration. With this level of echogenicity evaluation for underlying mass lesion is limited and if needed follow-up contrast CT or MRI as clinically appropriate. Portal vein is patent on color Doppler imaging with normal direction of blood flow towards the liver.   Other: None.   IMPRESSION: Fatty liver infiltration.   Dilated gallbladder. Dependent stones. No further sonographic evidence of acute cholecystitis. No ductal dilatation  - CT with contrast recommended - scheduled today at 1030 am

## 2024-05-14 NOTE — ED Provider Triage Note (Signed)
 Emergency Medicine Provider Triage Evaluation Note  Harriett Azar , a 63 y.o. female  was evaluated in triage.  Pt complains of abd pain. Ongoing abd pain for 5 days, with nausea vomiting and fever.  Has had abd US  now now abd/pelvis CT scan yesterday which shows inflamed gallbladder.  No cp, sob, or dysuria  Review of Systems  Positive: As above Negative: As above  Physical Exam  BP (!) 117/45 (BP Location: Left Arm)   Pulse 74   Temp (!) 97.5 F (36.4 C)   Resp 17   Ht 5' 5 (1.651 m)   Wt 92.5 kg   SpO2 97%   BMI 33.95 kg/m  Gen:   Awake, no distress   Resp:  Normal effort  MSK:   Moves extremities without difficulty  Other:    Medical Decision Making  Medically screening exam initiated at 2:15 PM.  Appropriate orders placed.  Shawntae Lowy was informed that the remainder of the evaluation will be completed by another provider, this initial triage assessment does not replace that evaluation, and the importance of remaining in the ED until their evaluation is complete.  Abd/pelvis CT showing acute cholecystitis.  Request next available room.     Nivia Colon, PA-C 05/14/24 1416

## 2024-05-14 NOTE — Assessment & Plan Note (Signed)
 CBC showed elevated WBC - Symptoms of RUQ abdominal pain, dysuria and nausea and vomiting shows concern for infection. - empirically treat with antibiotics and send urine for culture - CT scheduled @1030  am today Lab Results  Component Value Date   WBC 15.1 (H) 05/12/2024

## 2024-05-14 NOTE — ED Notes (Signed)
 Rebeckah RN called CCMD to transfer patient to RM 46 monitor.

## 2024-05-14 NOTE — Assessment & Plan Note (Signed)
 Diabetes Does not check blood sugar regularly A1C stable UA  showed 3+ Glucose   Lab Results  Component Value Date   HGBA1C 6.8 (H) 05/04/2024    - Continue use of Farxiga  10 mg by mouth daily, initially prescribed for cardiac indications

## 2024-05-14 NOTE — ED Triage Notes (Signed)
 Pt had CT scan and pt stated doctor call and stated gallbladder was infected. C/O abd pain/n/v. Denies CP and SHOB.

## 2024-05-14 NOTE — ED Provider Notes (Signed)
 Care seen from provider.  See note for full HPI.  In summation 63 year old multiple medical comorbidities here for evaluation of abdominal pain.  Had CT scan outpatient setting which showed acute cholecystitis.  Previous provider discussed with general surgery.  Will see in consult.  Plan on follow-up on labs and likely medicine admission Physical Exam  BP 111/69   Pulse 66   Temp (!) 97.5 F (36.4 C)   Resp 18   Ht 5' 5 (1.651 m)   Wt 92.5 kg   SpO2 98%   BMI 33.95 kg/m   Physical Exam Vitals and nursing note reviewed.  Constitutional:      General: She is not in acute distress.    Appearance: She is well-developed. She is not ill-appearing.  HENT:     Head: Atraumatic.   Eyes:     Pupils: Pupils are equal, round, and reactive to light.    Cardiovascular:     Rate and Rhythm: Normal rate.  Pulmonary:     Effort: No respiratory distress.  Abdominal:     General: There is no distension.   Musculoskeletal:        General: Normal range of motion.     Cervical back: Normal range of motion.   Skin:    General: Skin is warm and dry.   Neurological:     General: No focal deficit present.     Mental Status: She is alert.   Psychiatric:        Mood and Affect: Mood normal.     Procedures  Procedures Labs Reviewed  CBC WITH DIFFERENTIAL/PLATELET - Abnormal; Notable for the following components:      Result Value   WBC 12.2 (*)    RBC 5.28 (*)    Hemoglobin 16.9 (*)    HCT 48.9 (*)    Monocytes Absolute 1.2 (*)    All other components within normal limits  COMPREHENSIVE METABOLIC PANEL WITH GFR - Abnormal; Notable for the following components:   Glucose, Bld 125 (*)    AST 148 (*)    ALT 62 (*)    Total Bilirubin 1.4 (*)    All other components within normal limits  URINALYSIS, ROUTINE W REFLEX MICROSCOPIC - Abnormal; Notable for the following components:   Color, Urine STRAW (*)    Glucose, UA 150 (*)    Leukocytes,Ua SMALL (*)    Bacteria, UA RARE (*)     All other components within normal limits  I-STAT CG4 LACTIC ACID, ED - Abnormal; Notable for the following components:   Lactic Acid, Venous 2.9 (*)    All other components within normal limits  LIPASE, BLOOD  I-STAT CG4 LACTIC ACID, ED   CT ABDOMEN PELVIS W CONTRAST Result Date: 05/14/2024 CLINICAL DATA:  Right upper quadrant abdominal pain. EXAM: CT ABDOMEN AND PELVIS WITH CONTRAST TECHNIQUE: Multidetector CT imaging of the abdomen and pelvis was performed using the standard protocol following bolus administration of intravenous contrast. RADIATION DOSE REDUCTION: This exam was performed according to the departmental dose-optimization program which includes automated exposure control, adjustment of the mA and/or kV according to patient size and/or use of iterative reconstruction technique. CONTRAST:  OMNIPAQUE  IOHEXOL  300 MG/ML  SOLN COMPARISON:  11/19/2016 FINDINGS: Lower chest: No acute abnormality. Hepatobiliary: Heterogeneous enhancement on the arterial phase images noted. On the portal venous phase images there is a uniform enhancement pattern of the liver without suspicious liver abnormality. Several subcentimeter low-density lesions are identified which are technically too small to  reliably characterize but favored to represent small cysts. Index lesion within segment 8/4 measures 7 mm, image 22/301. Gallbladder wall enhancement with mild thickening and pericholecystic soft tissue stranding noted. No calcified gallstones identified. There is fusiform dilatation of the common bile duct which measures 1.0 cm, image 115/601. There is mild to moderate intrahepatic bile duct dilatation. No calcified stones identified along the course of the ureter. No mass identified. Pancreas: Unremarkable. No pancreatic ductal dilatation or surrounding inflammatory changes. Spleen: Small low-density structure within the spleen measures 6 mm, image 37/310. Spleen otherwise normal. Adrenals/Urinary Tract: There  is nodular thickening of both adrenal glands, unchanged from 11/19/2016 compatible with underlying adenomas. No follow-up imaging recommended. No kidney mass or obstructive uropathy. Contrast is identified within the collecting systems of both kidneys an ureters. Urinary bladder is within normal limits. Stomach/Bowel: Stomach is normal. The appendix is visualized and is normal. Diffuse colonic diverticulosis is most severe at the level of the sigmoid colon. Mild wall thickening with subtle adjacent fat stranding is identified involving the mid sigmoid colon, image 136/301. These findings may reflect sequelae of chronic diverticular disease. Uncomplicated acute diverticulitis not excluded. Vascular/Lymphatic: Aortic atherosclerosis. No enlarged abdominal or pelvic lymph nodes. Reproductive: Status post hysterectomy. No adnexal masses. Other: No free fluid or fluid collections. No signs of pneumoperitoneum. Musculoskeletal: No acute or significant osseous findings. IMPRESSION: 1. Gallbladder wall enhancement with mild thickening and pericholecystic soft tissue stranding noted. No calcified gallstones identified. Findings are concerning for acute cholecystitis. 2. Fusiform dilatation of the common bile duct which measures 1.0 cm. There is mild to moderate intrahepatic bile duct dilatation. No calcified stones identified along the course of the ureter. No mass identified. Consider further evaluation with MRCP. 3. Diffuse colonic diverticulosis is most severe at the level of the sigmoid colon. Mild wall thickening with subtle adjacent fat stranding is identified involving the mid sigmoid colon. These findings may reflect sequelae of chronic diverticular disease. Uncomplicated mild acute diverticulitis not excluded. 4. Stable nodular thickening of both adrenal glands, unchanged from 11/19/2016 compatible with underlying adenomas. No follow-up imaging recommended. 5.  Aortic Atherosclerosis (ICD10-I70.0). Electronically  Signed   By: Waddell Calk M.D.   On: 05/14/2024 11:49   US  Abdomen Limited RUQ (LIVER/GB) Result Date: 05/13/2024 CLINICAL DATA:  Right upper quadrant pain.  Nausea vomiting EXAM: ULTRASOUND ABDOMEN LIMITED RIGHT UPPER QUADRANT COMPARISON:  None Available. FINDINGS: Gallbladder: Dilated gallbladder. Dependent stones. No wall thickening or adjacent fluid. No reported Murphy's sign. Common bile duct: Diameter: 3 mm Liver: Diffusely echogenic hepatic parenchyma consistent with fatty liver infiltration. With this level of echogenicity evaluation for underlying mass lesion is limited and if needed follow-up contrast CT or MRI as clinically appropriate. Portal vein is patent on color Doppler imaging with normal direction of blood flow towards the liver. Other: None. IMPRESSION: Fatty liver infiltration. Dilated gallbladder. Dependent stones. No further sonographic evidence of acute cholecystitis. No ductal dilatation Electronically Signed   By: Ranell Bring M.D.   On: 05/13/2024 11:31    ED Course / MDM   Clinical Course as of 05/14/24 1744  Fri May 14, 2024  1501 Medicine admit once labs return. Surgery aware [BH]    Clinical Course User Index [BH] Anysha Frappier A, PA-C   Care seen from provider.  See note for full HPI.  In summation 63 year old multiple medical comorbidities here for evaluation of abdominal pain.  Had CT scan outpatient setting which showed acute cholecystitis.  Previous provider discussed with general surgery.  Will see in consult.  Plan on follow-up on labs and likely medicine admission  Labs and imaging personally viewed interpreted  See surgery note Bent PA-C>>  N.p.o. after midnight, admit to medicine  Patient assessed.  Does not need anything for pain.  Updated on plan for medicine admission.  She is agreeable.  Discussed with Dr. Judeth with medicine who is agreeable to evaluate patient for admission.  The patient appears reasonably stabilized for admission  considering the current resources, flow, and capabilities available in the ED at this time, and I doubt any other [facility] requiring further screening and/or treatment in the ED prior to admission.    Medical Decision Making Amount and/or Complexity of Data Reviewed External Data Reviewed: labs, radiology and notes. Labs: ordered. Decision-making details documented in ED Course.  Risk OTC drugs. Prescription drug management. Parenteral controlled substances. Decision regarding hospitalization. Diagnosis or treatment significantly limited by social determinants of health.          Cope Marte A, PA-C 05/14/24 1744    Patsey Lot, MD 05/15/24 [phone]

## 2024-05-15 ENCOUNTER — Encounter (HOSPITAL_COMMUNITY)
Admission: EM | Disposition: A | Discharge status: Home / Self Care | Payer: Self-pay | Source: Home / Self Care | Attending: Emergency Medicine

## 2024-05-15 ENCOUNTER — Other Ambulatory Visit: Payer: Self-pay

## 2024-05-15 ENCOUNTER — Observation Stay (HOSPITAL_COMMUNITY): Admitting: Anesthesiology

## 2024-05-15 ENCOUNTER — Encounter (HOSPITAL_COMMUNITY): Payer: Self-pay | Admitting: Internal Medicine

## 2024-05-15 ENCOUNTER — Observation Stay (HOSPITAL_BASED_OUTPATIENT_CLINIC_OR_DEPARTMENT_OTHER): Admitting: Anesthesiology

## 2024-05-15 ENCOUNTER — Observation Stay (HOSPITAL_COMMUNITY)

## 2024-05-15 DIAGNOSIS — Z955 Presence of coronary angioplasty implant and graft: Secondary | ICD-10-CM | POA: Diagnosis not present

## 2024-05-15 DIAGNOSIS — I130 Hypertensive heart and chronic kidney disease with heart failure and stage 1 through stage 4 chronic kidney disease, or unspecified chronic kidney disease: Secondary | ICD-10-CM | POA: Diagnosis not present

## 2024-05-15 DIAGNOSIS — K810 Acute cholecystitis: Secondary | ICD-10-CM | POA: Diagnosis not present

## 2024-05-15 DIAGNOSIS — I5022 Chronic systolic (congestive) heart failure: Secondary | ICD-10-CM

## 2024-05-15 DIAGNOSIS — K219 Gastro-esophageal reflux disease without esophagitis: Secondary | ICD-10-CM | POA: Diagnosis not present

## 2024-05-15 DIAGNOSIS — E669 Obesity, unspecified: Secondary | ICD-10-CM | POA: Diagnosis not present

## 2024-05-15 DIAGNOSIS — I25118 Atherosclerotic heart disease of native coronary artery with other forms of angina pectoris: Secondary | ICD-10-CM

## 2024-05-15 DIAGNOSIS — N183 Chronic kidney disease, stage 3 unspecified: Secondary | ICD-10-CM | POA: Diagnosis not present

## 2024-05-15 DIAGNOSIS — Z8673 Personal history of transient ischemic attack (TIA), and cerebral infarction without residual deficits: Secondary | ICD-10-CM | POA: Diagnosis not present

## 2024-05-15 DIAGNOSIS — I10 Essential (primary) hypertension: Secondary | ICD-10-CM | POA: Diagnosis not present

## 2024-05-15 DIAGNOSIS — Z6833 Body mass index (BMI) 33.0-33.9, adult: Secondary | ICD-10-CM | POA: Diagnosis not present

## 2024-05-15 DIAGNOSIS — I2510 Atherosclerotic heart disease of native coronary artery without angina pectoris: Secondary | ICD-10-CM | POA: Diagnosis not present

## 2024-05-15 DIAGNOSIS — I509 Heart failure, unspecified: Secondary | ICD-10-CM | POA: Diagnosis not present

## 2024-05-15 DIAGNOSIS — I110 Hypertensive heart disease with heart failure: Secondary | ICD-10-CM

## 2024-05-15 DIAGNOSIS — E785 Hyperlipidemia, unspecified: Secondary | ICD-10-CM | POA: Diagnosis not present

## 2024-05-15 DIAGNOSIS — K819 Cholecystitis, unspecified: Secondary | ICD-10-CM

## 2024-05-15 DIAGNOSIS — K8012 Calculus of gallbladder with acute and chronic cholecystitis without obstruction: Secondary | ICD-10-CM | POA: Diagnosis not present

## 2024-05-15 DIAGNOSIS — E876 Hypokalemia: Secondary | ICD-10-CM | POA: Diagnosis not present

## 2024-05-15 DIAGNOSIS — E119 Type 2 diabetes mellitus without complications: Secondary | ICD-10-CM | POA: Diagnosis not present

## 2024-05-15 HISTORY — PX: CHOLECYSTECTOMY: SHX55

## 2024-05-15 LAB — CBC
HCT: 43.1 % (ref 36.0–46.0)
Hemoglobin: 14.6 g/dL (ref 12.0–15.0)
MCH: 31.5 pg (ref 26.0–34.0)
MCHC: 33.9 g/dL (ref 30.0–36.0)
MCV: 93.1 fL (ref 80.0–100.0)
Platelets: 246 10*3/uL (ref 150–400)
RBC: 4.63 MIL/uL (ref 3.87–5.11)
RDW: 12.9 % (ref 11.5–15.5)
WBC: 6.2 10*3/uL (ref 4.0–10.5)
nRBC: 0 % (ref 0.0–0.2)

## 2024-05-15 LAB — COMPREHENSIVE METABOLIC PANEL WITH GFR
ALT: 79 U/L — ABNORMAL HIGH (ref 0–44)
AST: 104 U/L — ABNORMAL HIGH (ref 15–41)
Albumin: 3.3 g/dL — ABNORMAL LOW (ref 3.5–5.0)
Alkaline Phosphatase: 93 U/L (ref 38–126)
Anion gap: 12 (ref 5–15)
BUN: 12 mg/dL (ref 8–23)
CO2: 28 mmol/L (ref 22–32)
Calcium: 8.5 mg/dL — ABNORMAL LOW (ref 8.9–10.3)
Chloride: 99 mmol/L (ref 98–111)
Creatinine, Ser: 1.08 mg/dL — ABNORMAL HIGH (ref 0.44–1.00)
GFR, Estimated: 58 mL/min — ABNORMAL LOW (ref >60)
Glucose, Bld: 110 mg/dL — ABNORMAL HIGH (ref 70–99)
Potassium: 3.1 mmol/L — ABNORMAL LOW (ref 3.5–5.1)
Sodium: 139 mmol/L (ref 135–145)
Total Bilirubin: 1.3 mg/dL — ABNORMAL HIGH (ref 0.0–1.2)
Total Protein: 6.1 g/dL — ABNORMAL LOW (ref 6.5–8.1)

## 2024-05-15 LAB — GLUCOSE, CAPILLARY
Glucose-Capillary: 178 mg/dL — ABNORMAL HIGH (ref 70–99)
Glucose-Capillary: 177 mg/dL — ABNORMAL HIGH (ref 70–99)

## 2024-05-15 LAB — URINE CULTURE

## 2024-05-15 LAB — HIV ANTIBODY (ROUTINE TESTING W REFLEX): HIV Screen 4th Generation wRfx: NONREACTIVE

## 2024-05-15 LAB — MAGNESIUM: Magnesium: 1.8 mg/dL (ref 1.7–2.4)

## 2024-05-15 SURGERY — LAPAROSCOPIC CHOLECYSTECTOMY WITH INTRAOPERATIVE CHOLANGIOGRAM
Anesthesia: General

## 2024-05-15 MED ORDER — SUGAMMADEX SODIUM 200 MG/2ML IV SOLN
INTRAVENOUS | Status: DC | PRN
  Administered 2024-05-15: 200 mg via INTRAVENOUS

## 2024-05-15 MED ORDER — PHENYLEPHRINE HCL-NACL 20-0.9 MG/250ML-% IV SOLN
INTRAVENOUS | Status: DC | PRN
  Administered 2024-05-15: 20 ug/min via INTRAVENOUS

## 2024-05-15 MED ORDER — POTASSIUM CHLORIDE CRYS ER 20 MEQ PO TBCR
40.0000 meq | EXTENDED_RELEASE_TABLET | ORAL | Status: AC
  Administered 2024-05-15 (×2): 40 meq via ORAL
  Filled 2024-05-15 (×2): qty 2

## 2024-05-15 MED ORDER — PHENYLEPHRINE 80 MCG/ML (10ML) SYRINGE FOR IV PUSH (FOR BLOOD PRESSURE SUPPORT)
PREFILLED_SYRINGE | INTRAVENOUS | Status: DC | PRN
  Administered 2024-05-15 (×2): 80 ug via INTRAVENOUS

## 2024-05-15 MED ORDER — PROPOFOL 10 MG/ML IV BOLUS
INTRAVENOUS | Status: AC
  Filled 2024-05-15: qty 20

## 2024-05-15 MED ORDER — SCOPOLAMINE 1 MG/3DAYS TD PT72
1.0000 | MEDICATED_PATCH | TRANSDERMAL | Status: DC
  Administered 2024-05-15: 1.5 mg via TRANSDERMAL

## 2024-05-15 MED ORDER — LIDOCAINE 2% (20 MG/ML) 5 ML SYRINGE
INTRAMUSCULAR | Status: AC
  Filled 2024-05-15: qty 5

## 2024-05-15 MED ORDER — DOCUSATE SODIUM 100 MG PO CAPS
100.0000 mg | ORAL_CAPSULE | Freq: Two times a day (BID) | ORAL | Status: DC
  Administered 2024-05-15 – 2024-05-17 (×5): 100 mg via ORAL
  Filled 2024-05-15 (×6): qty 1

## 2024-05-15 MED ORDER — FENTANYL CITRATE (PF) 250 MCG/5ML IJ SOLN
INTRAMUSCULAR | Status: DC | PRN
  Administered 2024-05-15: 50 ug via INTRAVENOUS
  Administered 2024-05-15: 150 ug via INTRAVENOUS
  Administered 2024-05-15: 50 ug via INTRAVENOUS

## 2024-05-15 MED ORDER — PROPOFOL 10 MG/ML IV BOLUS
INTRAVENOUS | Status: DC | PRN
  Administered 2024-05-15 (×3): 50 mg via INTRAVENOUS

## 2024-05-15 MED ORDER — SODIUM CHLORIDE 0.9 % IR SOLN
Status: DC | PRN
  Administered 2024-05-15: 1000 mL

## 2024-05-15 MED ORDER — SODIUM CHLORIDE 0.9 % IV SOLN
INTRAVENOUS | Status: DC | PRN
  Administered 2024-05-15: 25 mL

## 2024-05-15 MED ORDER — MIDAZOLAM HCL 2 MG/2ML IJ SOLN
INTRAMUSCULAR | Status: AC
  Filled 2024-05-15: qty 2

## 2024-05-15 MED ORDER — LACTATED RINGERS IV SOLN: INTRAVENOUS | Status: DC

## 2024-05-15 MED ORDER — BUPIVACAINE-EPINEPHRINE (PF) 0.25% -1:200000 IJ SOLN
INTRAMUSCULAR | Status: AC
  Filled 2024-05-15: qty 30

## 2024-05-15 MED ORDER — ORAL CARE MOUTH RINSE: 15.0000 mL | Freq: Once | OROMUCOSAL | Status: AC

## 2024-05-15 MED ORDER — MAGNESIUM SULFATE IN D5W 1-5 GM/100ML-% IV SOLN
1.0000 g | Freq: Once | INTRAVENOUS | Status: AC
  Administered 2024-05-16: 1 g via INTRAVENOUS
  Filled 2024-05-15: qty 100

## 2024-05-15 MED ORDER — PROCHLORPERAZINE EDISYLATE 10 MG/2ML IJ SOLN
10.0000 mg | INTRAMUSCULAR | Status: DC | PRN
  Administered 2024-05-15: 10 mg via INTRAVENOUS
  Filled 2024-05-15 (×2): qty 2

## 2024-05-15 MED ORDER — METHOCARBAMOL 1000 MG/10ML IJ SOLN: 500.0000 mg | Freq: Four times a day (QID) | INTRAMUSCULAR | Status: DC | PRN

## 2024-05-15 MED ORDER — POTASSIUM CHLORIDE CRYS ER 20 MEQ PO TBCR
40.0000 meq | EXTENDED_RELEASE_TABLET | ORAL | Status: AC
  Administered 2024-05-15: 40 meq via ORAL
  Filled 2024-05-15: qty 2

## 2024-05-15 MED ORDER — FENTANYL CITRATE (PF) 100 MCG/2ML IJ SOLN
25.0000 ug | INTRAMUSCULAR | Status: DC | PRN
  Administered 2024-05-15 (×2): 50 ug via INTRAVENOUS

## 2024-05-15 MED ORDER — ONDANSETRON HCL 4 MG/2ML IJ SOLN
INTRAMUSCULAR | Status: AC
  Filled 2024-05-15: qty 2

## 2024-05-15 MED ORDER — CHLORHEXIDINE GLUCONATE 0.12 % MT SOLN
OROMUCOSAL | Status: AC
  Filled 2024-05-15: qty 15

## 2024-05-15 MED ORDER — 0.9 % SODIUM CHLORIDE (POUR BTL) OPTIME
TOPICAL | Status: DC | PRN
  Administered 2024-05-15: 1000 mL

## 2024-05-15 MED ORDER — DEXAMETHASONE SODIUM PHOSPHATE 10 MG/ML IJ SOLN
INTRAMUSCULAR | Status: AC
  Filled 2024-05-15: qty 1

## 2024-05-15 MED ORDER — GABAPENTIN 300 MG PO CAPS
300.0000 mg | ORAL_CAPSULE | Freq: Three times a day (TID) | ORAL | Status: DC
  Administered 2024-05-15 – 2024-05-16 (×5): 300 mg via ORAL
  Filled 2024-05-15 (×8): qty 1

## 2024-05-15 MED ORDER — OXYCODONE HCL 5 MG PO TABS
2.5000 mg | ORAL_TABLET | Freq: Four times a day (QID) | ORAL | Status: DC | PRN
  Administered 2024-05-15 – 2024-05-17 (×2): 2.5 mg via ORAL
  Filled 2024-05-15 (×2): qty 1

## 2024-05-15 MED ORDER — SCOPOLAMINE 1 MG/3DAYS TD PT72
MEDICATED_PATCH | TRANSDERMAL | Status: AC
  Filled 2024-05-15: qty 1

## 2024-05-15 MED ORDER — FENTANYL CITRATE (PF) 250 MCG/5ML IJ SOLN
INTRAMUSCULAR | Status: AC
  Filled 2024-05-15: qty 5

## 2024-05-15 MED ORDER — POTASSIUM CHLORIDE 10 MEQ/100ML IV SOLN: 10.0000 meq | INTRAVENOUS | Status: DC

## 2024-05-15 MED ORDER — AMISULPRIDE (ANTIEMETIC) 5 MG/2ML IV SOLN
INTRAVENOUS | Status: AC
  Filled 2024-05-15: qty 4

## 2024-05-15 MED ORDER — CHLORHEXIDINE GLUCONATE 0.12 % MT SOLN
15.0000 mL | Freq: Once | OROMUCOSAL | Status: AC
  Administered 2024-05-15: 15 mL via OROMUCOSAL

## 2024-05-15 MED ORDER — PHENYLEPHRINE 80 MCG/ML (10ML) SYRINGE FOR IV PUSH (FOR BLOOD PRESSURE SUPPORT)
PREFILLED_SYRINGE | INTRAVENOUS | Status: AC
  Filled 2024-05-15: qty 10

## 2024-05-15 MED ORDER — DEXAMETHASONE SODIUM PHOSPHATE 10 MG/ML IJ SOLN
INTRAMUSCULAR | Status: DC | PRN
  Administered 2024-05-15: 10 mg via INTRAVENOUS

## 2024-05-15 MED ORDER — OXYCODONE-ACETAMINOPHEN 5-325 MG PO TABS
1.0000 | ORAL_TABLET | Freq: Four times a day (QID) | ORAL | Status: DC | PRN
  Administered 2024-05-15 – 2024-05-17 (×4): 1 via ORAL
  Filled 2024-05-15 (×4): qty 1

## 2024-05-15 MED ORDER — ROCURONIUM BROMIDE 10 MG/ML (PF) SYRINGE
PREFILLED_SYRINGE | INTRAVENOUS | Status: AC
  Filled 2024-05-15: qty 10

## 2024-05-15 MED ORDER — PANTOPRAZOLE SODIUM 40 MG PO TBEC
40.0000 mg | DELAYED_RELEASE_TABLET | Freq: Every day | ORAL | Status: DC
  Administered 2024-05-15 – 2024-05-16 (×2): 40 mg via ORAL
  Filled 2024-05-15 (×2): qty 1

## 2024-05-15 MED ORDER — FENTANYL CITRATE (PF) 100 MCG/2ML IJ SOLN
INTRAMUSCULAR | Status: AC
  Filled 2024-05-15: qty 2

## 2024-05-15 MED ORDER — ROCURONIUM BROMIDE 10 MG/ML (PF) SYRINGE
PREFILLED_SYRINGE | INTRAVENOUS | Status: DC | PRN
  Administered 2024-05-15: 50 mg via INTRAVENOUS

## 2024-05-15 MED ORDER — ACETAMINOPHEN 10 MG/ML IV SOLN
1000.0000 mg | Freq: Once | INTRAVENOUS | Status: DC | PRN
  Administered 2024-05-15: 1000 mg via INTRAVENOUS

## 2024-05-15 MED ORDER — ONDANSETRON HCL 4 MG/2ML IJ SOLN
INTRAMUSCULAR | Status: DC | PRN
  Administered 2024-05-15: 4 mg via INTRAVENOUS

## 2024-05-15 MED ORDER — BUPIVACAINE-EPINEPHRINE 0.25% -1:200000 IJ SOLN
INTRAMUSCULAR | Status: DC | PRN
  Administered 2024-05-15: 30 mL

## 2024-05-15 MED ORDER — AMISULPRIDE (ANTIEMETIC) 5 MG/2ML IV SOLN
10.0000 mg | Freq: Once | INTRAVENOUS | Status: AC | PRN
  Administered 2024-05-15: 10 mg via INTRAVENOUS

## 2024-05-15 MED ORDER — MIDAZOLAM HCL 2 MG/2ML IJ SOLN
INTRAMUSCULAR | Status: DC | PRN
  Administered 2024-05-15: 2 mg via INTRAVENOUS

## 2024-05-15 MED ORDER — POTASSIUM CHLORIDE 10 MEQ/100ML IV SOLN
INTRAVENOUS | Status: AC
Start: 2024-05-15 — End: 2024-05-15
  Filled 2024-05-15: qty 100

## 2024-05-15 MED ORDER — ACETAMINOPHEN 10 MG/ML IV SOLN
INTRAVENOUS | Status: AC
Start: 2024-05-15 — End: 2024-05-15
  Filled 2024-05-15: qty 100

## 2024-05-15 MED ORDER — LIDOCAINE 2% (20 MG/ML) 5 ML SYRINGE
INTRAMUSCULAR | Status: DC | PRN
  Administered 2024-05-15: 50 mg via INTRAVENOUS

## 2024-05-15 SURGICAL SUPPLY — 44 items
BAG COUNTER SPONGE SURGICOUNT (BAG) ×1 IMPLANT
CANISTER SUCTION 3000ML PPV (SUCTIONS) ×1 IMPLANT
CATH URETL OPEN 5X70 (CATHETERS) ×1 IMPLANT
CATH URETL OPEN END 6FR 70 (CATHETERS) IMPLANT
CHLORAPREP W/TINT 26 (MISCELLANEOUS) ×1 IMPLANT
CLIP APPLIE ROT 10 11.4 M/L (STAPLE) ×1 IMPLANT
COVER MAYO STAND STRL (DRAPES) IMPLANT
COVER SURGICAL LIGHT HANDLE (MISCELLANEOUS) ×1 IMPLANT
DERMABOND ADVANCED .7 DNX12 (GAUZE/BANDAGES/DRESSINGS) ×1 IMPLANT
DRAPE C-ARM 42X120 X-RAY (DRAPES) ×1 IMPLANT
ELECTRODE REM PT RTRN 9FT ADLT (ELECTROSURGICAL) ×1 IMPLANT
ENDOLOOP SUT PDS II 0 18 (SUTURE) IMPLANT
GLOVE BIO SURGEON STRL SZ7.5 (GLOVE) ×1 IMPLANT
GLOVE BIOGEL PI IND STRL 8 (GLOVE) ×1 IMPLANT
GOWN STRL REUS W/ TWL LRG LVL3 (GOWN DISPOSABLE) ×2 IMPLANT
GOWN STRL REUS W/ TWL XL LVL3 (GOWN DISPOSABLE) ×1 IMPLANT
GRASPER SUT TROCAR 14GX15 (MISCELLANEOUS) ×1 IMPLANT
HEMOSTAT SNOW SURGICEL 2X4 (HEMOSTASIS) IMPLANT
IRRIGATION SUCT STRKRFLW 2 WTP (MISCELLANEOUS) ×1 IMPLANT
KIT BASIN OR (CUSTOM PROCEDURE TRAY) ×1 IMPLANT
KIT IMAGING PINPOINTPAQ (MISCELLANEOUS) IMPLANT
KIT TURNOVER KIT B (KITS) ×1 IMPLANT
NDL 22X1.5 STRL (OR ONLY) (MISCELLANEOUS) ×1 IMPLANT
NDL INSUFFLATION 14GA 120MM (NEEDLE) ×1 IMPLANT
NEEDLE 22X1.5 STRL (OR ONLY) (MISCELLANEOUS) ×1 IMPLANT
NEEDLE INSUFFLATION 14GA 120MM (NEEDLE) ×1 IMPLANT
NS IRRIG 1000ML POUR BTL (IV SOLUTION) ×1 IMPLANT
PAD ARMBOARD POSITIONER FOAM (MISCELLANEOUS) ×1 IMPLANT
POUCH RETRIEVAL ECOSAC 10 (ENDOMECHANICALS) ×1 IMPLANT
SCISSORS LAP 5X35 DISP (ENDOMECHANICALS) ×1 IMPLANT
SET CHOLANGIOGRAPH 5 50 .035 (SET/KITS/TRAYS/PACK) IMPLANT
SET TUBE SMOKE EVAC HIGH FLOW (TUBING) ×1 IMPLANT
SLEEVE Z-THREAD 5X100MM (TROCAR) ×2 IMPLANT
SPECIMEN JAR SMALL (MISCELLANEOUS) ×1 IMPLANT
STOPCOCK 4 WAY LG BORE MALE ST (IV SETS) ×1 IMPLANT
SUT MNCRL AB 4-0 PS2 18 (SUTURE) ×1 IMPLANT
SUT VICRYL 0 UR6 27IN ABS (SUTURE) IMPLANT
TOWEL GREEN STERILE (TOWEL DISPOSABLE) ×1 IMPLANT
TOWEL GREEN STERILE FF (TOWEL DISPOSABLE) ×1 IMPLANT
TRAY LAPAROSCOPIC MC (CUSTOM PROCEDURE TRAY) ×1 IMPLANT
TROCAR 11X100 Z THREAD (TROCAR) ×1 IMPLANT
TROCAR Z-THREAD OPTICAL 5X100M (TROCAR) ×1 IMPLANT
WARMER LAPAROSCOPE (MISCELLANEOUS) ×1 IMPLANT
WATER STERILE IRR 1000ML POUR (IV SOLUTION) ×1 IMPLANT

## 2024-05-15 NOTE — Transfer of Care (Signed)
 Immediate Anesthesia Transfer of Care Note  Patient: Allison White  Procedure(s) Performed: LAPAROSCOPIC CHOLECYSTECTOMY WITH INTRAOPERATIVE CHOLANGIOGRAM  Patient Location: PACU  Anesthesia Type:General  Level of Consciousness: awake  Airway & Oxygen Therapy: Patient Spontanous Breathing and Patient connected to nasal cannula oxygen  Post-op Assessment: Report given to RN and Post -op Vital signs reviewed and stable  Post vital signs: Reviewed and stable  Last Vitals:  Vitals Value Taken Time  BP 137/88 05/15/24 11:30  Temp 97   Pulse 76 05/15/24 11:34  Resp 15 05/15/24 11:34  SpO2 88 % 05/15/24 11:34  Vitals shown include unfiled device data.  Last Pain:  Vitals:   05/15/24 0750  TempSrc: Oral  PainSc:          Complications: There were no known notable events for this encounter.

## 2024-05-15 NOTE — Progress Notes (Signed)
 PROGRESS NOTE   Allison White  FMW:[id number]    DOB: 02/21/1961    DOA: 05/14/2024  PCP: Sherre Clapper, MD   I have briefly reviewed patients previous medical records in [facility].   Brief Hospital Course:  63 year old married female, lives in a 1 level house, medical history significant for extensive cardiac history including CAD, STEMI 11/19/2016 s/p DES to LAD and diagonal bifurcation, ICM, chronic systolic CHF, Biotronik defibrillator and Barostim implantation, breast cancer s/p lumpectomy x 2 and radiation of her left breast, HLD, HTN, PAD, presented to the ED with complaints of abdominal pain.  She is diagnosed with acute cholecystitis.  General surgeons have seen and plan laparoscopic cholecystectomy 6/28.  Cardiology have seen and provided preop clearance.   Assessment & Plan:   Acute cholecystitis suspected General surgery have seen and plan laparoscopic cholecystectomy 6/28. Patient has extensive cardiac history but overall stable from that standpoint. Cardiology have seen and provided preop recommendations/clearance, kindly refer to Dr. Denver note from 6/27.  15% risk of major cardiac event.  However no further cardiac workup indicated prior to procedure. Mild transaminitis and hyperbilirubinemia likely related to this.  Follow CMP in AM.  Hypokalemia Potassium 3.1 Replace IV while n.p.o. for procedure.  Check and replace magnesium as needed. Follow BMP in AM.   Chronic systolic CHF/ICM/ICD/Barostim Compensated/euvolemic Continue PTA meds including Farxiga , digoxin , Corlanor , Toprol -XL, spironolactone  and torsemide . Monitor on telemetry   CAD s/p stent No anginal symptoms Continue aspirin, Toprol -XL.   Essential hypertension Controlled on above meds, continue   Dyslipidemia Continue fenofibrate , Repatha  and Vascepa    GERD Changed to IV PPI   Diet controlled DM2 A1c 6/17: 6.8. Monitor CBGs closely and can add SSI if consistently greater than  180. Controlled here.  Body mass index is 33.95 kg/m./Obesity Complicates care.   DVT prophylaxis: enoxaparin (LOVENOX) injection 40 mg Start: 05/14/24 2000     Code Status: Full Code:  Family Communication: Discussed with daughter and son-in-law at bedside. Disposition:  Status is: Observation The patient remains OBS appropriate and will d/c before 2 midnights.     Consultants:   General Surgery Cardiology  Procedures:     Subjective:  Seen this morning while still in ED and seen prior to procedure.  Abdominal pain is better.  Noticing some leg cramps, claims not unusual now that she knows she has low potassium.  No chest pain or dyspnea.  Objective:   Vitals:   05/15/24 0345 05/15/24 0515 05/15/24 0615 05/15/24 0750  BP: 112/60 116/62  (!) 112/50  Pulse: 64 62 63 69  Resp: 15 16 14    Temp:    98.2 F (36.8 C)  TempSrc:    Oral  SpO2: 94% 95% 96% 99%  Weight:      Height:        General exam: Middle-age female, moderately built and obese lying comfortably supine in bed without distress. Respiratory system: Clear to auscultation. Respiratory effort normal. Cardiovascular system: S1 & S2 heard, RRR. No JVD, murmurs, rubs, gallops or clicks.  Trace bilateral ankle edema.  Telemetry personally reviewed at bedside: Baseline artifact but sinus rhythm and BBB morphology. Gastrointestinal system: Abdomen is nondistended, soft and nontender today even on deep palpation. No organomegaly or masses felt. Normal bowel sounds heard. Central nervous system: Alert and oriented. No focal neurological deficits. Extremities: Symmetric 5 x 5 power. Skin: No rashes, lesions or ulcers Psychiatry: Judgement and insight appear normal. Mood & affect appropriate.     Data Reviewed:  I have personally reviewed following labs and imaging studies   CBC: Recent Labs  Lab 05/12/24 1114 05/14/24 1430 05/15/24 0500  WBC 15.1* 12.2* 6.2  NEUTROABS 11.0* 7.5  --   HGB 16.9* 16.9* 14.6   HCT 50.1* 48.9* 43.1  MCV 95 92.6 93.1  PLT 292 292 246    Basic Metabolic Panel: Recent Labs  Lab 05/12/24 1114 05/14/24 1430 05/15/24 0500  NA 140 139 139  K 3.7 3.8 3.1*  CL 95* 101 99  CO2 24 25 28   GLUCOSE 144* 125* 110*  BUN 12 13 12   CREATININE 0.97 0.97 1.08*  CALCIUM  10.4* 9.5 8.5*    Liver Function Tests: Recent Labs  Lab 05/12/24 1114 05/14/24 1430 05/15/24 0500  AST 15 148* 104*  ALT 18 62* 79*  ALKPHOS 93 105 93  BILITOT 0.7 1.4* 1.3*  PROT 6.9 7.2 6.1*  ALBUMIN 4.3 3.9 3.3*    CBG: Recent Labs  Lab 05/14/24 2322  GLUCAP 108*    Microbiology Studies:  No results found for this or any previous visit (from the past 240 hours).  Radiology Studies:  CT ABDOMEN PELVIS W CONTRAST Result Date: 05/14/2024 CLINICAL DATA:  Right upper quadrant abdominal pain. EXAM: CT ABDOMEN AND PELVIS WITH CONTRAST TECHNIQUE: Multidetector CT imaging of the abdomen and pelvis was performed using the standard protocol following bolus administration of intravenous contrast. RADIATION DOSE REDUCTION: This exam was performed according to the departmental dose-optimization program which includes automated exposure control, adjustment of the mA and/or kV according to patient size and/or use of iterative reconstruction technique. CONTRAST:  OMNIPAQUE  IOHEXOL  300 MG/ML  SOLN COMPARISON:  11/19/2016 FINDINGS: Lower chest: No acute abnormality. Hepatobiliary: Heterogeneous enhancement on the arterial phase images noted. On the portal venous phase images there is a uniform enhancement pattern of the liver without suspicious liver abnormality. Several subcentimeter low-density lesions are identified which are technically too small to reliably characterize but favored to represent small cysts. Index lesion within segment 8/4 measures 7 mm, image 22/301. Gallbladder wall enhancement with mild thickening and pericholecystic soft tissue stranding noted. No calcified gallstones identified.  There is fusiform dilatation of the common bile duct which measures 1.0 cm, image 115/601. There is mild to moderate intrahepatic bile duct dilatation. No calcified stones identified along the course of the ureter. No mass identified. Pancreas: Unremarkable. No pancreatic ductal dilatation or surrounding inflammatory changes. Spleen: Small low-density structure within the spleen measures 6 mm, image 37/310. Spleen otherwise normal. Adrenals/Urinary Tract: There is nodular thickening of both adrenal glands, unchanged from 11/19/2016 compatible with underlying adenomas. No follow-up imaging recommended. No kidney mass or obstructive uropathy. Contrast is identified within the collecting systems of both kidneys an ureters. Urinary bladder is within normal limits. Stomach/Bowel: Stomach is normal. The appendix is visualized and is normal. Diffuse colonic diverticulosis is most severe at the level of the sigmoid colon. Mild wall thickening with subtle adjacent fat stranding is identified involving the mid sigmoid colon, image 136/301. These findings may reflect sequelae of chronic diverticular disease. Uncomplicated acute diverticulitis not excluded. Vascular/Lymphatic: Aortic atherosclerosis. No enlarged abdominal or pelvic lymph nodes. Reproductive: Status post hysterectomy. No adnexal masses. Other: No free fluid or fluid collections. No signs of pneumoperitoneum. Musculoskeletal: No acute or significant osseous findings. IMPRESSION: 1. Gallbladder wall enhancement with mild thickening and pericholecystic soft tissue stranding noted. No calcified gallstones identified. Findings are concerning for acute cholecystitis. 2. Fusiform dilatation of the common bile duct which measures 1.0 cm. There is  mild to moderate intrahepatic bile duct dilatation. No calcified stones identified along the course of the ureter. No mass identified. Consider further evaluation with MRCP. 3. Diffuse colonic diverticulosis is most severe at  the level of the sigmoid colon. Mild wall thickening with subtle adjacent fat stranding is identified involving the mid sigmoid colon. These findings may reflect sequelae of chronic diverticular disease. Uncomplicated mild acute diverticulitis not excluded. 4. Stable nodular thickening of both adrenal glands, unchanged from 11/19/2016 compatible with underlying adenomas. No follow-up imaging recommended. 5.  Aortic Atherosclerosis (ICD10-I70.0). Electronically Signed   By: Waddell Calk M.D.   On: 05/14/2024 11:49   US  Abdomen Limited RUQ (LIVER/GB) Result Date: 05/13/2024 CLINICAL DATA:  Right upper quadrant pain.  Nausea vomiting EXAM: ULTRASOUND ABDOMEN LIMITED RIGHT UPPER QUADRANT COMPARISON:  None Available. FINDINGS: Gallbladder: Dilated gallbladder. Dependent stones. No wall thickening or adjacent fluid. No reported Murphy's sign. Common bile duct: Diameter: 3 mm Liver: Diffusely echogenic hepatic parenchyma consistent with fatty liver infiltration. With this level of echogenicity evaluation for underlying mass lesion is limited and if needed follow-up contrast CT or MRI as clinically appropriate. Portal vein is patent on color Doppler imaging with normal direction of blood flow towards the liver. Other: None. IMPRESSION: Fatty liver infiltration. Dilated gallbladder. Dependent stones. No further sonographic evidence of acute cholecystitis. No ductal dilatation Electronically Signed   By: Ranell Bring M.D.   On: 05/13/2024 11:31    Scheduled Meds:    aspirin EC  81 mg Oral q AM   dapagliflozin  propanediol  10 mg Oral q AM   digoxin   0.125 mg Oral QHS   enoxaparin (LOVENOX) injection  40 mg Subcutaneous Q24H   fenofibrate   160 mg Oral q AM   icosapent  Ethyl  2 g Oral BID   ivabradine   7.5 mg Oral BID WC   loratadine  10 mg Oral q AM   metoprolol  succinate  50 mg Oral BID   pantoprazole  (PROTONIX ) IV  40 mg Intravenous Q24H   raloxifene   60 mg Oral QHS   spironolactone   25 mg Oral q AM    torsemide   50 mg Oral q AM    Continuous Infusions:    piperacillin-tazobactam (ZOSYN)  IV 3.375 g (05/15/24 9392)   potassium chloride        LOS: 0 days     Trenda Mar, MD,  FACP, [facility], [facility], [facility]   [facility] [place]      To contact the attending provider between 7A-7P or the covering provider during after hours 7P-7A, please log into the web site [email or web address] and access using universal [place] password for that web site. If you do not have the password, please call the hospital operator.  05/15/2024, 8:12 AM

## 2024-05-15 NOTE — Anesthesia Preprocedure Evaluation (Addendum)
 Anesthesia Evaluation  Patient identified by MRN, date of birth, ID band Patient awake    Reviewed: Allergy & Precautions, NPO status , Patient's Chart, lab work & pertinent test results  History of Anesthesia Complications (+) PONV and history of anesthetic complications  Airway Mallampati: II  TM Distance: >3 FB Neck ROM: Full    Dental no notable dental hx.    Pulmonary former smoker   Pulmonary exam normal        Cardiovascular hypertension, Pt. on medications and Pt. on home beta blockers + CAD, + Past MI, + Cardiac Stents and +CHF  + Cardiac Defibrillator  Rhythm:Regular Rate:Normal  ECHO 2024:  1. Left ventricular ejection fraction, by estimation, is 20 to 25%. The  left ventricle has severely decreased function. The left ventricle  demonstrates global hypokinesis. The left ventricular internal cavity size  was moderately to severely dilated.  Left ventricular diastolic parameters are consistent with Grade II  diastolic dysfunction (pseudonormalization).   2. Right ventricular systolic function is normal. The right ventricular  size is normal. There is normal pulmonary artery systolic pressure.   3. Left atrial size was moderately dilated.   4. The mitral valve is normal in structure. Moderate mitral valve  regurgitation. No evidence of mitral stenosis.   5. The aortic valve is normal in structure. Aortic valve regurgitation is  not visualized. No aortic stenosis is present.   6. The inferior vena cava is normal in size with greater than 50%  respiratory variability, suggesting right atrial pressure of 3 mmHg.     Neuro/Psych  Headaches CVA, No Residual Symptoms  negative psych ROS   GI/Hepatic Neg liver ROS,GERD  ,,Cholecystitis    Endo/Other  diabetes    Renal/GU   negative genitourinary   Musculoskeletal  (+) Arthritis , Osteoarthritis,    Abdominal Normal abdominal exam  (+)   Peds  Hematology Lab  Results      Component                Value               Date                      WBC                      6.2                 05/15/2024                HGB                      14.6                05/15/2024                HCT                      43.1                05/15/2024                MCV                      93.1                05/15/2024  PLT                      246                 05/15/2024             Lab Results      Component                Value               Date                      NA                       139                 05/15/2024                K                        3.1 (L)             05/15/2024                CO2                      28                  05/15/2024                GLUCOSE                  110 (H)             05/15/2024                BUN                      12                  05/15/2024                CREATININE               1.08 (H)            05/15/2024                CALCIUM                   8.5 (L)             05/15/2024                GFR                      71.82               07/22/2013                EGFR                     66                  05/12/2024                GFRNONAA                 58 (  L)              05/15/2024              Anesthesia Other Findings   Reproductive/Obstetrics                             Anesthesia Physical Anesthesia Plan  ASA: 4  Anesthesia Plan: General   Post-op Pain Management:    Induction: Intravenous  PONV Risk Score and Plan: 4 or greater and Ondansetron , Dexamethasone , Midazolam  and Treatment may vary due to age or medical condition  Airway Management Planned: Mask and Oral ETT  Additional Equipment: ClearSight  Intra-op Plan:   Post-operative Plan: Extubation in OR  Informed Consent: I have reviewed the patients History and Physical, chart, labs and discussed the procedure including the risks, benefits and alternatives for the  proposed anesthesia with the patient or authorized representative who has indicated his/her understanding and acceptance.     Dental advisory given  Plan Discussed with: CRNA  Anesthesia Plan Comments:        Anesthesia Quick Evaluation

## 2024-05-15 NOTE — Anesthesia Postprocedure Evaluation (Signed)
 Anesthesia Post Note  Patient: Allison White  Procedure(s) Performed: LAPAROSCOPIC CHOLECYSTECTOMY WITH INTRAOPERATIVE CHOLANGIOGRAM     Patient location during evaluation: PACU Anesthesia Type: General Level of consciousness: awake and alert Pain management: pain level controlled Vital Signs Assessment: post-procedure vital signs reviewed and stable Respiratory status: spontaneous breathing, nonlabored ventilation, respiratory function stable and patient connected to nasal cannula oxygen Cardiovascular status: blood pressure returned to baseline and stable Postop Assessment: no apparent nausea or vomiting Anesthetic complications: no   There were no known notable events for this encounter.  Last Vitals:  Vitals:   05/15/24 1230 05/15/24 1245  BP: (!) 142/67 122/78  Pulse: 74 70  Resp: 15 17  Temp:  36.9 C  SpO2: 95% 93%    Last Pain:  Vitals:   05/15/24 1354  TempSrc:   PainSc: 7                  Veasna Santibanez P Jane Broughton

## 2024-05-15 NOTE — Anesthesia Procedure Notes (Signed)
 Procedure Name: Intubation Date/Time: 05/15/2024 10:03 AM  Performed by: Tressie Gilmore RAMAN, CRNAPre-anesthesia Checklist: Patient identified, Emergency Drugs available, Suction available and Patient being monitored Patient Re-evaluated:Patient Re-evaluated prior to induction Oxygen Delivery Method: Circle System Utilized Preoxygenation: Pre-oxygenation with 100% oxygen Induction Type: IV induction Ventilation: Mask ventilation without difficulty Laryngoscope Size: Mac and 3 Grade View: Grade II Tube type: Oral Tube size: 7.0 mm Number of attempts: 1 Airway Equipment and Method: Stylet and Oral airway Placement Confirmation: ETT inserted through vocal cords under direct vision, positive ETCO2 and breath sounds checked- equal and bilateral Secured at: 21 cm Tube secured with: Tape Dental Injury: Teeth and Oropharynx as per pre-operative assessment

## 2024-05-15 NOTE — Progress Notes (Signed)
 Progress Note: General Surgery Service   Chief Complaint/Subjective: Abdominal pain  Objective: Vital signs in last 24 hours: Temp:  [97.5 F (36.4 C)-98.2 F (36.8 C)] 98.2 F (36.8 C) (06/28 0750) Pulse Rate:  [62-74] 69 (06/28 0750) Resp:  [12-22] 14 (06/28 0615) BP: (102-129)/(44-75) 112/50 (06/28 0750) SpO2:  [94 %-100 %] 99 % (06/28 0750) Weight:  [92.5 kg-92.8 kg] 92.5 kg (06/27 1358)    Intake/Output from previous day: 06/27 0701 - 06/28 0700 In: 50 [IV Piggyback:50] Out: -  Intake/Output this shift: No intake/output data recorded.  Constitutional: NAD; conversant; no deformities Eyes: Moist conjunctiva; no lid lag; anicteric; PERRL Neck: Trachea midline; no thyromegaly Lungs: Normal respiratory effort; no tactile fremitus CV: RRR; no palpable thrills; no pitting edema GI: Abd Soft, tender RUQ; no palpable hepatosplenomegaly MSK: Normal range of motion of extremities; no clubbing/cyanosis Psychiatric: Appropriate affect; alert and oriented x3 Lymphatic: No palpable cervical or axillary lymphadenopathy  Lab Results: CBC  Recent Labs    05/14/24 1430 05/15/24 0500  WBC 12.2* 6.2  HGB 16.9* 14.6  HCT 48.9* 43.1  PLT 292 246   BMET Recent Labs    05/14/24 1430 05/15/24 0500  NA 139 139  K 3.8 3.1*  CL 101 99  CO2 25 28  GLUCOSE 125* 110*  BUN 13 12  CREATININE 0.97 1.08*  CALCIUM  9.5 8.5*   PT/INR No results for input(s): LABPROT, INR in the last 72 hours. ABG No results for input(s): PHART, HCO3 in the last 72 hours.  Invalid input(s): PCO2, PO2  Anti-infectives: Anti-infectives (From admission, onward)    Start     Dose/Rate Route Frequency Ordered Stop   05/14/24 2200  piperacillin-tazobactam (ZOSYN) IVPB 3.375 g        3.375 g 12.5 mL/hr over 240 Minutes Intravenous Every 8 hours 05/14/24 1817     05/14/24 1430  piperacillin-tazobactam (ZOSYN) IVPB 3.375 g        3.375 g 100 mL/hr over 30 Minutes Intravenous  Once  05/14/24 1429 05/14/24 1526       Medications: Scheduled Meds:  aspirin EC  81 mg Oral q AM   dapagliflozin  propanediol  10 mg Oral q AM   digoxin   0.125 mg Oral QHS   enoxaparin (LOVENOX) injection  40 mg Subcutaneous Q24H   fenofibrate   160 mg Oral q AM   icosapent  Ethyl  2 g Oral BID   ivabradine   7.5 mg Oral BID WC   loratadine  10 mg Oral q AM   metoprolol  succinate  50 mg Oral BID   pantoprazole  (PROTONIX ) IV  40 mg Intravenous Q24H   raloxifene   60 mg Oral QHS   spironolactone   25 mg Oral q AM   torsemide   50 mg Oral q AM   Continuous Infusions:  piperacillin-tazobactam (ZOSYN)  IV 3.375 g (05/15/24 0607)   potassium chloride      PRN Meds:.albuterol, HYDROcodone -acetaminophen , HYDROmorphone  (DILAUDID ) injection, nitroGLYCERIN , ondansetron   Assessment/Plan: s/p Procedure(s): LAPAROSCOPIC CHOLECYSTECTOMY WITH INTRAOPERATIVE CHOLANGIOGRAM 05/15/2024  Allison White is a 63 year old female with abdominal pain concerning for cholecystitis or choledocholtihiasis.  I recommend laparoscopic cholecystectomy with intraoperative cholangiogram.  We discussed the procedure itself as well as its risks, benefits and alternatives.  Risks discussed included but were not limited to the risk of infection, bleeding, damage to nearby structures, bile leak, anesthetic risks as well as cardiac risks.    She was evaluated by Dr. Barbaraann with cardiology and deamed to have elevated preoperative risk (15%  risk of major cardiac event).  No additional testing was recommended.  After a full discussion and all questions answered the patient granted consent to proceed.   We will proceed as scheduled.   LOS: 0 days      Allison JINNY Foy, MD  [facility] Use [email or web address] to contact on call provider  Daily Billing: [id number] - High MDM

## 2024-05-15 NOTE — Op Note (Signed)
 Patient: Allison White (1961/09/04, [id number])  Date of Surgery: 05/15/2024  Preoperative Diagnosis: Cholecystitis, possible choledocholithiasis  Postoperative Diagnosis: Cholecystitis, possible choledocholithiasis  Surgical Procedure: LAPAROSCOPIC CHOLECYSTECTOMY    Operative Team Members:  Surgeons and Role:    * Quintyn Dombek, Deward PARAS, MD - Primary   Anesthesiologist: Dorethea Cordella SQUIBB, DO CRNA: Tressie Gilmore RAMAN, CRNA   Anesthesia: General   Fluids:  Total I/O In: -  Out: 40 [Blood:40]  Complications: none  Drains:  none   Specimen:  ID Type Source Tests Collected by Time Destination  1 : Gallbladder Tissue PATH Gallbladder SURGICAL PATHOLOGY Brayley Mackowiak, Deward PARAS, MD 05/15/2024 1030      Disposition:  PACU - hemodynamically stable.  Plan of Care: Continue inpatient care    Indications for Procedure: Allison White is a 63 y.o. female who presented with abdominal pain.  History, physical and imaging was concerning for cholecystitis with possible choledocholithiasis.  Laparoscopic cholecystectomy was recommended for the patient.  The procedure itself, as well as the risks, benefits and alternatives were discussed with the patient.  Risks discussed included but were not limited to the risk of infection, bleeding, damage to nearby structures, need to convert to open procedure, incisional hernia, bile leak, common bile duct injury and the need for additional procedures or surgeries.  With this discussion complete and all questions answered the patient granted consent to proceed.  Findings: Unable to complete cholangiogram  Infection status: Patient: Allison White Emergency General Surgery Service Patient Case: Urgent Infection Present At Time Of Surgery (PATOS): Spillage of bile during the case   Description of Procedure:   On the date stated above, the patient was taken to the operating room suite and placed in supine positioning.  Sequential compression devices were  placed on the lower extremities to prevent blood clots.  General endotracheal anesthesia was induced. Preoperative antibiotics were given.  The patient's abdomen was prepped and draped in the usual sterile fashion.  A time-out was completed verifying the correct patient, procedure, positioning and equipment needed for the case.  We began by anesthetizing the skin with local anesthetic and then making a 5 mm incision just below the umbilicus.  We dissected through the subcutaneous tissues to the fascia.  The fascia was grasped and elevated using a Kocher clamp.  A Veress needle was inserted into the abdomen and the abdomen was insufflated to 15 mmHg.  A 5 mm trocar was inserted in this position under optical guidance and then the abdomen was inspected.  There was no trauma to the underlying viscera with initial trocar placement.  Any abnormal findings, other than inflammation in the right upper quadrant, are listed above in the findings section.  Three additional trocars were placed, one 12 mm trocar in the subxiphoid position, one 5 mm trocar in the midline epigastric area and one 5mm trocar in the right upper quadrant subcostally.  These were placed under direct vision without any trauma to the underlying viscera.    The patient was then placed in head up, left side down positioning.  The gallbladder was identified and dissected free from its attachments to the omentum allowing the duodenum to fall away.  The infundibulum of the gallbladder was dissected free working laterally to medially.  The cystic duct and cystic artery were dissected free from surrounding connective tissue.  The infundibulum of the gallbladder was dissected off the cystic plate.  A critical view of safety was obtained with the cystic duct and cystic artery being cleared of  connective tissues and clearly the only two structures entering into the gallbladder with the liver clearly visible behind.  One clip was applied high on the cystic  duct.  A small ductotomy was created below this using the endoscopic shears.  A cholangiogram catheter was introduced through the abdominal wall and into the cystic duct through this ductotomy.  The catheter was clipped into position.  The catheter was flushed to ensure no leakage around the clip.  We then removed the laparoscopic instruments and positioned the C-Arm to perform a cholangiogram.  The catheter was flushed with contrast under fluoroscopic visualization and a cholangiogram was obtained.  The cholangiogram catheter had flipped up into the gallbladder and filled the gallbladder with contrast.  I felt the ductotomy was actually on the infundibulum, and didn't feel comfortable dissecting closer to the common bile duct due to the risk of common bile duct injury so the cholangiogram was aborted.  We moved the c-arm away from the field and returned to laparoscopic surgery.    Clips were then applied to the cystic duct and cystic artery and then these structures were divided.  A PDS endoloop was placed to secure the cystic duct.  The gallbladder was dissected off the cystic plate, placed in an endocatch bag and removed from the 12 mm subxiphoid port site.  A artery was encountered high along the cytic plate.  It was clipped and pressure held and hemostasis otained.  The clips were inspected and appeared effective.  The cystic plate was inspected and hemostasis was obtained using electrocautery.  A piece of snow topical hemostatic was placed in the gallbladder fossa. A suction irrigator was used to clean the operative field.  Attention was turned to closure.  The 12 mm subxiphoid port site was closed using a 0-vicryl suture on a fascial suture passer.  The abdomen was desufflated.  The skin was closed using 4-0 monocryl and dermabond.  All sponge and needle counts were correct at the conclusion of the case.    Deward Foy, MD General, Bariatric, & Minimally Invasive Surgery [facility]  Surgery, [place]

## 2024-05-15 NOTE — ED Notes (Signed)
 Report given to surgical

## 2024-05-15 NOTE — Final Consult Note (Signed)
 Cardiology available as needed. See consult note/recommendations. Preoperative evaluation performed. Cardiac conditions stable. Please call with questions.   Allison T. Barbaraann, MD, [facility]  [facility]  [address], [address] [place], [place] [address] [phone]  7:40 AM

## 2024-05-16 ENCOUNTER — Encounter (HOSPITAL_COMMUNITY): Payer: Self-pay | Admitting: Surgery

## 2024-05-16 DIAGNOSIS — K810 Acute cholecystitis: Secondary | ICD-10-CM | POA: Diagnosis not present

## 2024-05-16 DIAGNOSIS — I5022 Chronic systolic (congestive) heart failure: Secondary | ICD-10-CM | POA: Diagnosis not present

## 2024-05-16 DIAGNOSIS — I10 Essential (primary) hypertension: Secondary | ICD-10-CM | POA: Diagnosis not present

## 2024-05-16 DIAGNOSIS — E785 Hyperlipidemia, unspecified: Secondary | ICD-10-CM | POA: Diagnosis not present

## 2024-05-16 LAB — CBC
HCT: 45 % (ref 36.0–46.0)
Hemoglobin: 14.9 g/dL (ref 12.0–15.0)
MCH: 31.6 pg (ref 26.0–34.0)
MCHC: 33.1 g/dL (ref 30.0–36.0)
MCV: 95.5 fL (ref 80.0–100.0)
Platelets: 214 10*3/uL (ref 150–400)
RBC: 4.71 MIL/uL (ref 3.87–5.11)
RDW: 13.1 % (ref 11.5–15.5)
WBC: 10.7 10*3/uL — ABNORMAL HIGH (ref 4.0–10.5)
nRBC: 0 % (ref 0.0–0.2)

## 2024-05-16 LAB — GLUCOSE, CAPILLARY
Glucose-Capillary: 139 mg/dL — ABNORMAL HIGH (ref 70–99)
Glucose-Capillary: 118 mg/dL — ABNORMAL HIGH (ref 70–99)
Glucose-Capillary: 126 mg/dL — ABNORMAL HIGH (ref 70–99)
Glucose-Capillary: 118 mg/dL — ABNORMAL HIGH (ref 70–99)

## 2024-05-16 LAB — HEPATIC FUNCTION PANEL
ALT: 101 U/L — ABNORMAL HIGH (ref 0–44)
AST: 101 U/L — ABNORMAL HIGH (ref 15–41)
Albumin: 3.3 g/dL — ABNORMAL LOW (ref 3.5–5.0)
Alkaline Phosphatase: 107 U/L (ref 38–126)
Bilirubin, Direct: 0.3 mg/dL — ABNORMAL HIGH (ref 0.0–0.2)
Indirect Bilirubin: 1 mg/dL — ABNORMAL HIGH (ref 0.3–0.9)
Total Bilirubin: 1.3 mg/dL — ABNORMAL HIGH (ref 0.0–1.2)
Total Protein: 6.2 g/dL — ABNORMAL LOW (ref 6.5–8.1)

## 2024-05-16 LAB — BASIC METABOLIC PANEL WITH GFR
Anion gap: 12 (ref 5–15)
BUN: 12 mg/dL (ref 8–23)
CO2: 25 mmol/L (ref 22–32)
Calcium: 8.8 mg/dL — ABNORMAL LOW (ref 8.9–10.3)
Chloride: 101 mmol/L (ref 98–111)
Creatinine, Ser: 1.25 mg/dL — ABNORMAL HIGH (ref 0.44–1.00)
GFR, Estimated: 49 mL/min — ABNORMAL LOW (ref >60)
Glucose, Bld: 146 mg/dL — ABNORMAL HIGH (ref 70–99)
Potassium: 4 mmol/L (ref 3.5–5.1)
Sodium: 138 mmol/L (ref 135–145)

## 2024-05-16 NOTE — Care Management Obs Status (Signed)
 MEDICARE OBSERVATION STATUS NOTIFICATION   Patient Details  Name: Allison White MRN: [id number] Date of Birth: [birth year]   Medicare Observation Status Notification Given:  Yes    Robynn Eileen Hoose, RN 05/16/2024, 7:44 AM

## 2024-05-16 NOTE — Progress Notes (Signed)
 Pt ambulated halls on room air. No complaints/symptoms while walking.

## 2024-05-16 NOTE — Progress Notes (Signed)
 Pt refusing scheduled am Torsemide . Educated on use of diuretics.

## 2024-05-16 NOTE — Progress Notes (Signed)
 PROGRESS NOTE   Allison White  FMW:[id number]    DOB: [birth year]    DOA: 05/14/2024  PCP: Sherre Clapper, MD   I have briefly reviewed patients previous medical records in [facility].   Brief Hospital Course:  63 year old married female, lives in a 1 level house, medical history significant for extensive cardiac history including CAD, STEMI 11/19/2016 s/p DES to LAD and diagonal bifurcation, ICM, chronic systolic CHF, Biotronik defibrillator and Barostim implantation, breast cancer s/p lumpectomy x 2 and radiation of her left breast, HLD, HTN, PAD, presented to the ED with complaints of abdominal pain.  She is diagnosed with acute cholecystitis.  General surgeons consulted, after Cardiology preop clearance, underwent laparoscopic cholecystectomy 6/28.   Assessment & Plan:   Acute cholecystitis, possible choledocholithiasis General surgeons consulted, after Cardiology preop clearance, underwent laparoscopic cholecystectomy 6/28. As per op note, it appears that they were unable to complete an IOC and there was some spillage of bile during the case. Hemodynamically stable.  LFTs about the same except slight worsening of ALT.  Hemodynamically stable without leukocytosis.  Tolerating regular diet. Await CCS follow-up regarding further course, monitor additional 24 hours with repeat LFTs versus DC home with outpatient follow-up.  Hypokalemia Replaced.  Magnesium 1.8.   Chronic systolic CHF/ICM/ICD/Barostim Compensated/euvolemic Continue PTA meds including Farxiga , digoxin , Corlanor , Toprol -XL, spironolactone  and torsemide . Monitor on telemetry Creatinine has slightly increased since admission, 0.97 > 1.08 > 1.25.  Encouraged more fluids for today.  Follow BMP in a.m. if still in house or next week as outpatient.  Discussed with patient and verbalizes understanding.   CAD s/p stent No anginal symptoms Continue aspirin, Toprol -XL.   Essential hypertension Controlled on above meds,  continue   Dyslipidemia Continue fenofibrate , Repatha  and Vascepa    GERD Changed to IV PPI   Diet controlled DM2 A1c 6/17: 6.8. Monitor CBGs closely and can add SSI if consistently greater than 180. Reasonably controlled.  Body mass index is 33.95 kg/m./Obesity Complicates care.   DVT prophylaxis: enoxaparin (LOVENOX) injection 40 mg Start: 05/14/24 2000     Code Status: Full Code:  Family Communication: None at bedside. Disposition:  Await CCS follow-up regarding discharge disposition.     Consultants:   General Surgery Cardiology  Procedures:   Laparoscopic cholecystectomy 6/28.  Subjective:  Epigastric area pain, unable to tell if it is procedure related pain or prior pain.  Abdomen feels slightly bloated.  Passing some flatus.  No BM.  Tolerated diet/scrambled eggs.  No dyspnea or chest pain.  Ambulating.  Objective:   Vitals:   05/15/24 2306 05/16/24 0310 05/16/24 0612 05/16/24 0747  BP: 130/65 129/74 138/77 125/72  Pulse: 77 66 81 87  Resp: 14 18 18  (!) 23  Temp: 98.6 F (37 C) 98 F (36.7 C)  98.2 F (36.8 C)  TempSrc: Oral Oral  Oral  SpO2: 98% 96% 100% 98%  Weight:      Height:        General exam: Middle-age female, moderately built and obese lying comfortably propped up in bed without distress.[name] Respiratory system: Clear to auscultation.  No increased work of breathing. Cardiovascular system: S1 & S2 heard, RRR. No JVD, murmurs, rubs, gallops or clicks.  Trace bilateral ankle edema.  Telemetry personally reviewed: Sinus rhythm.  Transient episode of ventricular rhythm.` Gastrointestinal system: Abdomen is nondistended, soft with appropriate postprocedural tenderness.  Laparoscopic sites without acute findings, clean and dry.  Normal bowel sounds heard. Central nervous system: Alert and oriented. No focal neurological deficits.  Extremities: Symmetric 5 x 5 power. Skin: No rashes, lesions or ulcers Psychiatry: Judgement and insight appear normal.  Mood & affect appropriate.     Data Reviewed:   I have personally reviewed following labs and imaging studies   CBC: Recent Labs  Lab 05/12/24 1114 05/14/24 1430 05/15/24 0500 05/16/24 0258  WBC 15.1* 12.2* 6.2 10.7*  NEUTROABS 11.0* 7.5  --   --   HGB 16.9* 16.9* 14.6 14.9  HCT 50.1* 48.9* 43.1 45.0  MCV 95 92.6 93.1 95.5  PLT 292 292 246 214    Basic Metabolic Panel: Recent Labs  Lab 05/12/24 1114 05/14/24 1430 05/15/24 0500 05/16/24 0258  NA 140 139 139 138  K 3.7 3.8 3.1* 4.0  CL 95* 101 99 101  CO2 24 25 28 25   GLUCOSE 144* 125* 110* 146*  BUN 12 13 12 12   CREATININE 0.97 0.97 1.08* 1.25*  CALCIUM  10.4* 9.5 8.5* 8.8*  MG  --   --  1.8  --     Liver Function Tests: Recent Labs  Lab 05/12/24 1114 05/14/24 1430 05/15/24 0500 05/16/24 0258  AST 15 148* 104* 101*  ALT 18 62* 79* 101*  ALKPHOS 93 105 93 107  BILITOT 0.7 1.4* 1.3* 1.3*  PROT 6.9 7.2 6.1* 6.2*  ALBUMIN 4.3 3.9 3.3* 3.3*    CBG: Recent Labs  Lab 05/15/24 1129 05/15/24 2116 05/16/24 0614  GLUCAP 178* 177* 139*    Microbiology Studies:   Recent Results (from the past 240 hours)  Urine Culture     Status: None   Collection Time: 05/14/24  9:56 AM   Specimen: Urine   UR  Result Value Ref Range Status   Urine Culture, Routine Final report  Final   Organism ID, Bacteria Comment  Final    Comment: Mixed urogenital flora 25,000-50,000 colony forming units per mL     Radiology Studies:  DG Cholangiogram Operative Result Date: 05/15/2024 CLINICAL DATA:  Laparoscopic cholecystectomy with attempted intraoperative cholangiogram. EXAM: INTRAOPERATIVE CHOLANGIOGRAM TECHNIQUE: Cholangiographic images from the C-arm fluoroscopic device were submitted for interpretation post-operatively. Please see the procedural report for the amount of contrast and the fluoroscopy time utilized. FLUOROSCOPY: Radiation Exposure Index (as provided by the fluoroscopic device): 8.8 seconds 2.5491 mGy Kerma  COMPARISON:  None Available. FINDINGS: Attempt was made to perform intraoperative cholangiogram in. Please see operative report for additional information. IMPRESSION: Intraoperative utilization of fluoroscopy. Electronically Signed   By: Leita Birmingham M.D.   On: 05/15/2024 15:00   CT ABDOMEN PELVIS W CONTRAST Result Date: 05/14/2024 CLINICAL DATA:  Right upper quadrant abdominal pain. EXAM: CT ABDOMEN AND PELVIS WITH CONTRAST TECHNIQUE: Multidetector CT imaging of the abdomen and pelvis was performed using the standard protocol following bolus administration of intravenous contrast. RADIATION DOSE REDUCTION: This exam was performed according to the departmental dose-optimization program which includes automated exposure control, adjustment of the mA and/or kV according to patient size and/or use of iterative reconstruction technique. CONTRAST:  OMNIPAQUE  IOHEXOL  300 MG/ML  SOLN COMPARISON:  11/19/2016 FINDINGS: Lower chest: No acute abnormality. Hepatobiliary: Heterogeneous enhancement on the arterial phase images noted. On the portal venous phase images there is a uniform enhancement pattern of the liver without suspicious liver abnormality. Several subcentimeter low-density lesions are identified which are technically too small to reliably characterize but favored to represent small cysts. Index lesion within segment 8/4 measures 7 mm, image 22/301. Gallbladder wall enhancement with mild thickening and pericholecystic soft tissue stranding noted. No calcified gallstones identified.  There is fusiform dilatation of the common bile duct which measures 1.0 cm, image 115/601. There is mild to moderate intrahepatic bile duct dilatation. No calcified stones identified along the course of the ureter. No mass identified. Pancreas: Unremarkable. No pancreatic ductal dilatation or surrounding inflammatory changes. Spleen: Small low-density structure within the spleen measures 6 mm, image 37/310. Spleen otherwise  normal. Adrenals/Urinary Tract: There is nodular thickening of both adrenal glands, unchanged from 11/19/2016 compatible with underlying adenomas. No follow-up imaging recommended. No kidney mass or obstructive uropathy. Contrast is identified within the collecting systems of both kidneys an ureters. Urinary bladder is within normal limits. Stomach/Bowel: Stomach is normal. The appendix is visualized and is normal. Diffuse colonic diverticulosis is most severe at the level of the sigmoid colon. Mild wall thickening with subtle adjacent fat stranding is identified involving the mid sigmoid colon, image 136/301. These findings may reflect sequelae of chronic diverticular disease. Uncomplicated acute diverticulitis not excluded. Vascular/Lymphatic: Aortic atherosclerosis. No enlarged abdominal or pelvic lymph nodes. Reproductive: Status post hysterectomy. No adnexal masses. Other: No free fluid or fluid collections. No signs of pneumoperitoneum. Musculoskeletal: No acute or significant osseous findings. IMPRESSION: 1. Gallbladder wall enhancement with mild thickening and pericholecystic soft tissue stranding noted. No calcified gallstones identified. Findings are concerning for acute cholecystitis. 2. Fusiform dilatation of the common bile duct which measures 1.0 cm. There is mild to moderate intrahepatic bile duct dilatation. No calcified stones identified along the course of the ureter. No mass identified. Consider further evaluation with MRCP. 3. Diffuse colonic diverticulosis is most severe at the level of the sigmoid colon. Mild wall thickening with subtle adjacent fat stranding is identified involving the mid sigmoid colon. These findings may reflect sequelae of chronic diverticular disease. Uncomplicated mild acute diverticulitis not excluded. 4. Stable nodular thickening of both adrenal glands, unchanged from 11/19/2016 compatible with underlying adenomas. No follow-up imaging recommended. 5.  Aortic  Atherosclerosis (ICD10-I70.0). Electronically Signed   By: Waddell Calk M.D.   On: 05/14/2024 11:49    Scheduled Meds:    aspirin EC  81 mg Oral q AM   dapagliflozin  propanediol  10 mg Oral q AM   digoxin   0.125 mg Oral QHS   docusate sodium  100 mg Oral BID   enoxaparin (LOVENOX) injection  40 mg Subcutaneous Q24H   fenofibrate   160 mg Oral q AM   gabapentin  300 mg Oral TID   icosapent  Ethyl  2 g Oral BID   ivabradine   7.5 mg Oral BID WC   loratadine  10 mg Oral q AM   metoprolol  succinate  50 mg Oral BID   pantoprazole   40 mg Oral QHS   raloxifene   60 mg Oral QHS   spironolactone   25 mg Oral q AM   torsemide   50 mg Oral q AM    Continuous Infusions:    piperacillin-tazobactam (ZOSYN)  IV 3.375 g (05/16/24 0628)     LOS: 0 days     Trenda Mar, MD,  FACP, [facility], [facility], [facility]   [facility] [place]      To contact the attending provider between 7A-7P or the covering provider during after hours 7P-7A, please log into the web site [email or web address] and access using universal [place] password for that web site. If you do not have the password, please call the hospital operator.  05/16/2024, 8:14 AM

## 2024-05-16 NOTE — Progress Notes (Signed)
 Mobility Specialist Progress Note;   05/16/24 0957  Mobility  Activity Ambulated independently in hallway  Level of Assistance Modified independent, requires aide device or extra time  Assistive Device None  Distance Ambulated (ft) 800 ft  Activity Response Tolerated well  Mobility Referral Yes  Mobility visit 1 Mobility  Mobility Specialist Start Time (ACUTE ONLY) 0957  Mobility Specialist Stop Time (ACUTE ONLY) 1015  Mobility Specialist Time Calculation (min) (ACUTE ONLY) 18 min   RN requesting pt to ambulate, pt agreeable. Required no physical assistance during ambulation, SV. VSS on RA. HR up to 122 bpm w/ activity. Pt returned back to bed with all needs met, call bell in reach. Husband present.   Lauraine Erm Mobility Specialist Please contact via SecureChat or [employer] [phone]

## 2024-05-16 NOTE — Progress Notes (Signed)
 1 Day Post-Op  Subjective: Sore around incisions  ROS: See above, otherwise other systems negative  Objective: Vital signs in last 24 hours: Temp:  [97.7 F (36.5 C)-98.6 F (37 C)] 97.7 F (36.5 C) (06/29 1235) Pulse Rate:  [66-87] 67 (06/29 1235) Resp:  [14-23] 20 (06/29 1235) BP: (122-138)/(63-77) 122/67 (06/29 1235) SpO2:  [89 %-100 %] 96 % (06/29 1235) Last BM Date : 05/13/24  Intake/Output from previous day: 06/28 0701 - 06/29 0700 In: 887.9 [P.O.:220; I.V.:516.3; IV Piggyback:151.6] Out: 840 [Urine:800; Blood:40] Intake/Output this shift: Total I/O In: 300 [P.O.:300] Out: -   PE: Gen: Comfortable Abd: Soft, incisions c/d/I w/ glue  Lab Results:  Recent Labs    05/15/24 0500 05/16/24 0258  WBC 6.2 10.7*  HGB 14.6 14.9  HCT 43.1 45.0  PLT 246 214   BMET Recent Labs    05/15/24 0500 05/16/24 0258  NA 139 138  K 3.1* 4.0  CL 99 101  CO2 28 25  GLUCOSE 110* 146*  BUN 12 12  CREATININE 1.08* 1.25*  CALCIUM  8.5* 8.8*   PT/INR No results for input(s): LABPROT, INR in the last 72 hours. CMP     Component Value Date/Time   NA 138 05/16/2024 0258   NA 140 05/12/2024 1114   K 4.0 05/16/2024 0258   CL 101 05/16/2024 0258   CO2 25 05/16/2024 0258   GLUCOSE 146 (H) 05/16/2024 0258   BUN 12 05/16/2024 0258   BUN 12 05/12/2024 1114   CREATININE 1.25 (H) 05/16/2024 0258   CALCIUM  8.8 (L) 05/16/2024 0258   PROT 6.2 (L) 05/16/2024 0258   PROT 6.9 05/12/2024 1114   ALBUMIN 3.3 (L) 05/16/2024 0258   ALBUMIN 4.3 05/12/2024 1114   AST 101 (H) 05/16/2024 0258   ALT 101 (H) 05/16/2024 0258   ALKPHOS 107 05/16/2024 0258   BILITOT 1.3 (H) 05/16/2024 0258   BILITOT 0.7 05/12/2024 1114   GFRNONAA 49 (L) 05/16/2024 0258   GFRAA 70 01/01/2021 1039   Lipase     Component Value Date/Time   LIPASE 39 05/14/2024 1430    Studies/Results: DG Cholangiogram Operative Result Date: 05/15/2024 CLINICAL DATA:  Laparoscopic cholecystectomy with attempted  intraoperative cholangiogram. EXAM: INTRAOPERATIVE CHOLANGIOGRAM TECHNIQUE: Cholangiographic images from the C-arm fluoroscopic device were submitted for interpretation post-operatively. Please see the procedural report for the amount of contrast and the fluoroscopy time utilized. FLUOROSCOPY: Radiation Exposure Index (as provided by the fluoroscopic device): 8.8 seconds 2.5491 mGy Kerma COMPARISON:  None Available. FINDINGS: Attempt was made to perform intraoperative cholangiogram in. Please see operative report for additional information. IMPRESSION: Intraoperative utilization of fluoroscopy. Electronically Signed   By: Leita Birmingham M.D.   On: 05/15/2024 15:00    Anti-infectives: Anti-infectives (From admission, onward)    Start     Dose/Rate Route Frequency Ordered Stop   05/14/24 2200  piperacillin-tazobactam (ZOSYN) IVPB 3.375 g        3.375 g 12.5 mL/hr over 240 Minutes Intravenous Every 8 hours 05/14/24 1817     05/14/24 1430  piperacillin-tazobactam (ZOSYN) IVPB 3.375 g        3.375 g 100 mL/hr over 30 Minutes Intravenous  Once 05/14/24 1429 05/14/24 1526       Assessment/Plan  Ms. Allison White is a 63 year old female s/p lap chole 6/29.  LFTs about the same.  Unable to perform IOC as catheter filled gallbladder and wouldn't pass into the ductal system.  Incisions c/d/I w/ glue.  I recommend keeping the  patient till tomorrow morning and rechecking LFTs prior to discharge.  I feel she is high risk for choledocholithiasis as she had multiple very small gallstones.  Surgery team will continue to follow.  Deward PARAS [facility] 05/16/2024, 1:47 PM Please see Amion for pager number during day hours 7:00am-4:30pm or 7:00am -11:30am on weekends

## 2024-05-16 NOTE — Plan of Care (Signed)
   Problem: Education: Goal: Knowledge of General Education information will improve Description Including pain rating scale, medication(s)/side effects and non-pharmacologic comfort measures Outcome: Progressing   Problem: Health Behavior/Discharge Planning: Goal: Ability to manage health-related needs will improve Outcome: Progressing

## 2024-05-16 NOTE — Plan of Care (Signed)

## 2024-05-16 NOTE — Progress Notes (Signed)
 Patient had an episode of vtach/wide qrs on 2257. Also recieved call from central telemetry unit advising of same. 45 beat long Vtach/Wide QRS. VSS, patient asymptomatic, only complaints was abdominal pain related to procedure done yesterday status post lap. Cholecystectomy, patient has a pacemaker and ICD. contacted on call MD advised of patient status. Ordered stat EKG, Potassium and Magnesium Replacement.

## 2024-05-17 ENCOUNTER — Telehealth: Payer: Self-pay

## 2024-05-17 DIAGNOSIS — R7989 Other specified abnormal findings of blood chemistry: Secondary | ICD-10-CM

## 2024-05-17 DIAGNOSIS — K8042 Calculus of bile duct with acute cholecystitis without obstruction: Secondary | ICD-10-CM | POA: Diagnosis not present

## 2024-05-17 DIAGNOSIS — I5022 Chronic systolic (congestive) heart failure: Secondary | ICD-10-CM | POA: Diagnosis not present

## 2024-05-17 LAB — BASIC METABOLIC PANEL WITH GFR
Anion gap: 10 (ref 5–15)
BUN: 13 mg/dL (ref 8–23)
CO2: 26 mmol/L (ref 22–32)
Calcium: 9 mg/dL (ref 8.9–10.3)
Chloride: 103 mmol/L (ref 98–111)
Creatinine, Ser: 1.23 mg/dL — ABNORMAL HIGH (ref 0.44–1.00)
GFR, Estimated: 50 mL/min — ABNORMAL LOW (ref >60)
Glucose, Bld: 124 mg/dL — ABNORMAL HIGH (ref 70–99)
Potassium: 3.8 mmol/L (ref 3.5–5.1)
Sodium: 139 mmol/L (ref 135–145)

## 2024-05-17 LAB — HEPATIC FUNCTION PANEL
ALT: 169 U/L — ABNORMAL HIGH (ref 0–44)
AST: 138 U/L — ABNORMAL HIGH (ref 15–41)
Albumin: 3.2 g/dL — ABNORMAL LOW (ref 3.5–5.0)
Alkaline Phosphatase: 121 U/L (ref 38–126)
Bilirubin, Direct: 0.5 mg/dL — ABNORMAL HIGH (ref 0.0–0.2)
Indirect Bilirubin: 0.9 mg/dL (ref 0.3–0.9)
Total Bilirubin: 1.4 mg/dL — ABNORMAL HIGH (ref 0.0–1.2)
Total Protein: 6 g/dL — ABNORMAL LOW (ref 6.5–8.1)

## 2024-05-17 LAB — CBC
HCT: 43.6 % (ref 36.0–46.0)
Hemoglobin: 14.4 g/dL (ref 12.0–15.0)
MCH: 31.9 pg (ref 26.0–34.0)
MCHC: 33 g/dL (ref 30.0–36.0)
MCV: 96.7 fL (ref 80.0–100.0)
Platelets: 170 10*3/uL (ref 150–400)
RBC: 4.51 MIL/uL (ref 3.87–5.11)
RDW: 13.2 % (ref 11.5–15.5)
WBC: 7.7 10*3/uL (ref 4.0–10.5)
nRBC: 0 % (ref 0.0–0.2)

## 2024-05-17 LAB — GLUCOSE, CAPILLARY
Glucose-Capillary: 159 mg/dL — ABNORMAL HIGH (ref 70–99)
Glucose-Capillary: 146 mg/dL — ABNORMAL HIGH (ref 70–99)

## 2024-05-17 MED ORDER — ACETAMINOPHEN 500 MG PO TABS: 1000.0000 mg | ORAL_TABLET | Freq: Four times a day (QID) | ORAL | Status: DC

## 2024-05-17 MED ORDER — METHOCARBAMOL 500 MG PO TABS
500.0000 mg | ORAL_TABLET | Freq: Three times a day (TID) | ORAL | Status: DC
  Administered 2024-05-17: 500 mg via ORAL
  Filled 2024-05-17: qty 1

## 2024-05-17 MED ORDER — HYDROMORPHONE HCL 1 MG/ML IJ SOLN: 0.5000 mg | Freq: Three times a day (TID) | INTRAMUSCULAR | Status: DC | PRN

## 2024-05-17 MED ORDER — METHOCARBAMOL 500 MG PO TABS: 500.0000 mg | ORAL_TABLET | Freq: Three times a day (TID) | ORAL | 0 refills | Status: DC | PRN

## 2024-05-17 MED ORDER — ACETAMINOPHEN 500 MG PO TABS
1000.0000 mg | ORAL_TABLET | Freq: Four times a day (QID) | ORAL | Status: DC
  Administered 2024-05-17: 1000 mg via ORAL
  Filled 2024-05-17: qty 2

## 2024-05-17 MED ORDER — OXYCODONE HCL 5 MG PO TABS
5.0000 mg | ORAL_TABLET | ORAL | Status: DC | PRN
  Administered 2024-05-17: 10 mg via ORAL
  Filled 2024-05-17: qty 2

## 2024-05-17 MED ORDER — OXYCODONE HCL 5 MG PO TABS: 5.0000 mg | ORAL_TABLET | ORAL | 0 refills | Status: DC | PRN

## 2024-05-17 NOTE — Progress Notes (Signed)
 PROGRESS NOTE   Allison White  FMW:[id number]    DOB: [birth year]    DOA: 05/14/2024  PCP: Sherre Clapper, MD   I have briefly reviewed patients previous medical records in [facility].   Brief Hospital Course:  63 year old married female, lives in a 1 level house, medical history significant for extensive cardiac history including CAD, STEMI 11/19/2016 s/p DES to LAD and diagonal bifurcation, ICM, chronic systolic CHF, Biotronik defibrillator and Barostim implantation, breast cancer s/p lumpectomy x 2 and radiation of her left breast, HLD, HTN, PAD, presented to the ED with complaints of abdominal pain.  She is diagnosed with acute cholecystitis.  General surgeons consulted, after Cardiology preop clearance, underwent laparoscopic cholecystectomy 6/28.  Unable to do IOC.  Postop, LFTs have gradually slightly worsened.?  GI consultation.   Assessment & Plan:   Acute cholecystitis, possible choledocholithiasis General surgeons consulted, after Cardiology preop clearance, underwent laparoscopic cholecystectomy 6/28. As per op note, it appears that they were unable to complete an IOC and there was some spillage of bile during the case. Postop has done well.  Tolerating diet, passing flatus, no BM yet.  Mild and appropriate postop pain.  Ambulating.  However transaminitis continues to worsen although mild.  As per surgical note, high risk for choledocholithiasis as she had very small gallstones. Await surgical follow-up,?  GI consult for ERCP.  Hypokalemia Replaced.  Magnesium 1.8.   Chronic systolic CHF/ICM/ICD/Barostim Compensated/euvolemic Continue PTA meds including Farxiga , digoxin , Corlanor , Toprol -XL, spironolactone  and torsemide . Monitor on telemetry Creatinine has slightly increased since admission, 0.97 > 1.08 > 1.25 >1.23.   Continue to follow BMP.   CAD s/p stent No anginal symptoms Continue aspirin, Toprol -XL.   Essential hypertension Controlled on above meds, continue    Dyslipidemia Continue fenofibrate , Repatha  and Vascepa    GERD Changed to IV PPI   Diet controlled DM2 A1c 6/17: 6.8. Monitor CBGs closely and can add SSI if consistently greater than 180. Reasonably controlled.  Body mass index is 33.95 kg/m./Obesity Complicates care.   DVT prophylaxis: enoxaparin (LOVENOX) injection 40 mg Start: 05/14/24 2000     Code Status: Full Code:  Family Communication: None at bedside. Disposition:  Await general surgery follow-up to determine further course.     Consultants:   General Surgery Cardiology  Procedures:   Laparoscopic cholecystectomy 6/28.  Subjective:  Tolerating diet, passing flatus, no BM yet but feels like she is going to have 1 soon.  Mild and appropriate postop pain.  Ambulating.  Has seen GI in [place] but nobody in [place].  Objective:   Vitals:   05/16/24 1933 05/16/24 2337 05/17/24 0328 05/17/24 0737  BP: 135/76 117/67 (!) 132/94 102/86  Pulse: 76 63 78 74  Resp: 17 20 19 18   Temp: 97.6 F (36.4 C) 98.1 F (36.7 C) 98.1 F (36.7 C) 98.1 F (36.7 C)  TempSrc: Oral Oral Oral Oral  SpO2: 97% 93% 91% 93%  Weight:      Height:        General exam: Middle-age female, moderately built and obese lying comfortably propped up in bed without distress.[name] Respiratory system: Clear to auscultation.  No increased work of breathing. Cardiovascular system: S1 & S2 heard, RRR. No JVD, murmurs, rubs, gallops or clicks.  Trace bilateral ankle edema.  Telemetry personally reviewed: Sinus rhythm. Gastrointestinal system: Abdomen is nondistended, soft with appropriate mild epigastric postprocedural tenderness.  Laparoscopic sites without acute findings, clean and dry.  Increased bowel sounds heard. Central nervous system: Alert and oriented. No  focal neurological deficits. Extremities: Symmetric 5 x 5 power. Skin: No rashes, lesions or ulcers Psychiatry: Judgement and insight appear normal. Mood & affect appropriate.      Data Reviewed:   I have personally reviewed following labs and imaging studies   CBC: Recent Labs  Lab 05/12/24 1114 05/14/24 1430 05/14/24 1430 05/15/24 0500 05/16/24 0258 05/17/24 0239  WBC 15.1* 12.2*   < > 6.2 10.7* 7.7  NEUTROABS 11.0* 7.5  --   --   --   --   HGB 16.9* 16.9*   < > 14.6 14.9 14.4  HCT 50.1* 48.9*   < > 43.1 45.0 43.6  MCV 95 92.6   < > 93.1 95.5 96.7  PLT 292 292   < > 246 214 170   < > = values in this interval not displayed.    Basic Metabolic Panel: Recent Labs  Lab 05/12/24 1114 05/14/24 1430 05/15/24 0500 05/16/24 0258 05/17/24 0239  NA 140 139 139 138 139  K 3.7 3.8 3.1* 4.0 3.8  CL 95* 101 99 101 103  CO2 24 25 28 25 26   GLUCOSE 144* 125* 110* 146* 124*  BUN 12 13 12 12 13   CREATININE 0.97 0.97 1.08* 1.25* 1.23*  CALCIUM  10.4* 9.5 8.5* 8.8* 9.0  MG  --   --  1.8  --   --     Liver Function Tests: Recent Labs  Lab 05/12/24 1114 05/14/24 1430 05/15/24 0500 05/16/24 0258 05/17/24 0239  AST 15 148* 104* 101* 138*  ALT 18 62* 79* 101* 169*  ALKPHOS 93 105 93 107 121  BILITOT 0.7 1.4* 1.3* 1.3* 1.4*  PROT 6.9 7.2 6.1* 6.2* 6.0*  ALBUMIN 4.3 3.9 3.3* 3.3* 3.2*    CBG: Recent Labs  Lab 05/16/24 1623 05/16/24 2123 05/17/24 0618  GLUCAP 126* 118* 159*    Microbiology Studies:   Recent Results (from the past 240 hours)  Urine Culture     Status: None   Collection Time: 05/14/24  9:56 AM   Specimen: Urine   UR  Result Value Ref Range Status   Urine Culture, Routine Final report  Final   Organism ID, Bacteria Comment  Final    Comment: Mixed urogenital flora 25,000-50,000 colony forming units per mL     Radiology Studies:  DG Cholangiogram Operative Result Date: 05/15/2024 CLINICAL DATA:  Laparoscopic cholecystectomy with attempted intraoperative cholangiogram. EXAM: INTRAOPERATIVE CHOLANGIOGRAM TECHNIQUE: Cholangiographic images from the C-arm fluoroscopic device were submitted for interpretation  post-operatively. Please see the procedural report for the amount of contrast and the fluoroscopy time utilized. FLUOROSCOPY: Radiation Exposure Index (as provided by the fluoroscopic device): 8.8 seconds 2.5491 mGy Kerma COMPARISON:  None Available. FINDINGS: Attempt was made to perform intraoperative cholangiogram in. Please see operative report for additional information. IMPRESSION: Intraoperative utilization of fluoroscopy. Electronically Signed   By: Leita Birmingham M.D.   On: 05/15/2024 15:00    Scheduled Meds:    aspirin EC  81 mg Oral q AM   dapagliflozin  propanediol  10 mg Oral q AM   digoxin   0.125 mg Oral QHS   docusate sodium  100 mg Oral BID   enoxaparin (LOVENOX) injection  40 mg Subcutaneous Q24H   fenofibrate   160 mg Oral q AM   gabapentin  300 mg Oral TID   icosapent  Ethyl  2 g Oral BID   ivabradine   7.5 mg Oral BID WC   loratadine  10 mg Oral q AM   metoprolol  succinate  50 mg Oral BID   pantoprazole   40 mg Oral QHS   raloxifene   60 mg Oral QHS   spironolactone   25 mg Oral q AM   torsemide   50 mg Oral q AM    Continuous Infusions:    piperacillin-tazobactam (ZOSYN)  IV 3.375 g (05/17/24 0547)     LOS: 0 days     Trenda Mar, MD,  FACP, [facility], [facility], [facility]   [facility] [place]      To contact the attending provider between 7A-7P or the covering provider during after hours 7P-7A, please log into the web site [email or web address] and access using universal [place] password for that web site. If you do not have the password, please call the hospital operator.  05/17/2024, 8:03 AM

## 2024-05-17 NOTE — Progress Notes (Signed)
 2 Days Post-Op  Subjective: Says percocet not helpful.  Still taking some dilaudid  for pain. Tolerating regular diet with no N/V.  Ambulating, + flatus.  ROS: See above, otherwise other systems negative  Objective: Vital signs in last 24 hours: Temp:  [97.6 F (36.4 C)-98.1 F (36.7 C)] 98.1 F (36.7 C) (06/30 0737) Pulse Rate:  [63-78] 74 (06/30 0737) Resp:  [17-20] 18 (06/30 0737) BP: (102-135)/(67-94) 102/86 (06/30 0737) SpO2:  [91 %-97 %] 93 % (06/30 0737) Last BM Date : 05/13/24  Intake/Output from previous day: 06/29 0701 - 06/30 0700 In: 540 [P.O.:540] Out: -  Intake/Output this shift: No intake/output data recorded.  PE: Abd: soft, appropriately tender, +BS, ND, incisions c/d/i  Lab Results:  Recent Labs    05/16/24 0258 05/17/24 0239  WBC 10.7* 7.7  HGB 14.9 14.4  HCT 45.0 43.6  PLT 214 170   BMET Recent Labs    05/16/24 0258 05/17/24 0239  NA 138 139  K 4.0 3.8  CL 101 103  CO2 25 26  GLUCOSE 146* 124*  BUN 12 13  CREATININE 1.25* 1.23*  CALCIUM  8.8* 9.0   PT/INR No results for input(s): LABPROT, INR in the last 72 hours. CMP     Component Value Date/Time   NA 139 05/17/2024 0239   NA 140 05/12/2024 1114   K 3.8 05/17/2024 0239   CL 103 05/17/2024 0239   CO2 26 05/17/2024 0239   GLUCOSE 124 (H) 05/17/2024 0239   BUN 13 05/17/2024 0239   BUN 12 05/12/2024 1114   CREATININE 1.23 (H) 05/17/2024 0239   CALCIUM  9.0 05/17/2024 0239   PROT 6.0 (L) 05/17/2024 0239   PROT 6.9 05/12/2024 1114   ALBUMIN 3.2 (L) 05/17/2024 0239   ALBUMIN 4.3 05/12/2024 1114   AST 138 (H) 05/17/2024 0239   ALT 169 (H) 05/17/2024 0239   ALKPHOS 121 05/17/2024 0239   BILITOT 1.4 (H) 05/17/2024 0239   BILITOT 0.7 05/12/2024 1114   GFRNONAA 50 (L) 05/17/2024 0239   GFRAA 70 01/01/2021 1039   Lipase     Component Value Date/Time   LIPASE 39 05/14/2024 1430       Studies/Results: DG Cholangiogram Operative Result Date: 05/15/2024 CLINICAL  DATA:  Laparoscopic cholecystectomy with attempted intraoperative cholangiogram. EXAM: INTRAOPERATIVE CHOLANGIOGRAM TECHNIQUE: Cholangiographic images from the C-arm fluoroscopic device were submitted for interpretation post-operatively. Please see the procedural report for the amount of contrast and the fluoroscopy time utilized. FLUOROSCOPY: Radiation Exposure Index (as provided by the fluoroscopic device): 8.8 seconds 2.5491 mGy Kerma COMPARISON:  None Available. FINDINGS: Attempt was made to perform intraoperative cholangiogram in. Please see operative report for additional information. IMPRESSION: Intraoperative utilization of fluoroscopy. Electronically Signed   By: Leita Birmingham M.D.   On: 05/15/2024 15:00    Anti-infectives: Anti-infectives (From admission, onward)    Start     Dose/Rate Route Frequency Ordered Stop   05/14/24 2200  piperacillin-tazobactam (ZOSYN) IVPB 3.375 g        3.375 g 12.5 mL/hr over 240 Minutes Intravenous Every 8 hours 05/14/24 1817     05/14/24 1430  piperacillin-tazobactam (ZOSYN) IVPB 3.375 g        3.375 g 100 mL/hr over 30 Minutes Intravenous  Once 05/14/24 1429 05/14/24 1526        Assessment/Plan POD 2, s/p lap chole Dr. Lyndel 6/28 -LFTs overall stable this am, particularly TB and alkphos.  Doubt CBD stone -tolerating regular diet -adjustment of pain meds made -likely nearing  stability for DC -d/w primary service.   FEN - regular VTE - lovenox  ID - zosyn, stopped    LOS: 0 days    Burnard FORBES Banter , [facility] 05/17/2024, 8:23 AM Please see Amion for pager number during day hours 7:00am-4:30pm or 7:00am -11:30am on weekends

## 2024-05-17 NOTE — Discharge Instructions (Addendum)
 [facility]  Please arrive at least 30 min before your appointment to complete your check in paperwork.  If you are unable to arrive 30 min prior to your appointment time we may have to cancel or reschedule you. LAPAROSCOPIC SURGERY: POST OP INSTRUCTIONS Always review your discharge instruction sheet given to you by the facility where your surgery was performed. IF YOU HAVE DISABILITY OR FAMILY LEAVE FORMS, YOU MUST BRING THEM TO THE OFFICE FOR PROCESSING.   DO NOT GIVE THEM TO YOUR DOCTOR.  PAIN CONTROL  First take acetaminophen  (Tylenol ) AND/or ibuprofen (Advil) to control your pain after surgery.  Follow directions on package.  Taking acetaminophen  (Tylenol ) and/or ibuprofen (Advil) regularly after surgery will help to control your pain and lower the amount of prescription pain medication you may need.  You should not take more than 4,000 mg (4 grams) of acetaminophen  (Tylenol ) in 24 hours.  You should not take ibuprofen (Advil), aleve, motrin, naprosyn or other NSAIDS if you have a history of stomach ulcers or chronic kidney disease.  A prescription for pain medication may be given to you upon discharge.  Take your pain medication as prescribed, if you still have uncontrolled pain after taking acetaminophen  (Tylenol ) or ibuprofen (Advil). Use ice packs to help control pain. If you need a refill on your pain medication, please contact your pharmacy.  They will contact our office to request authorization. Prescriptions will not be filled after 5pm or on week-ends.  HOME MEDICATIONS Take your usually prescribed medications unless otherwise directed.  DIET You should follow a light diet the first few days after arrival home.  Be sure to include lots of fluids daily. Avoid fatty, fried foods.   CONSTIPATION It is common to experience some constipation after surgery and if you are taking pain medication.  Increasing fluid intake and taking a stool softener (such as Colace)  will usually help or prevent this problem from occurring.  A mild laxative (Milk of Magnesia or Miralax) should be taken according to package instructions if there are no bowel movements after 48 hours.  WOUND/INCISION CARE Most patients will experience some swelling and bruising in the area of the incisions.  Ice packs will help.  Swelling and bruising can take several days to resolve.  Unless discharge instructions indicate otherwise, follow guidelines below  STERI-STRIPS - you may remove your outer bandages 48 hours after surgery, and you may shower at that time.  You have steri-strips (small skin tapes) in place directly over the incision.  These strips should be left on the skin for 7-10 days.   DERMABOND/SKIN GLUE - you may shower in 24 hours.  The glue will flake off over the next 2-3 weeks. Any sutures or staples will be removed at the office during your follow-up visit.  ACTIVITIES You may resume regular (light) daily activities beginning the next day--such as daily self-care, walking, climbing stairs--gradually increasing activities as tolerated.  You may have sexual intercourse when it is comfortable.  Refrain from any heavy lifting or straining until approved by your doctor. You may drive when you are no longer taking prescription pain medication, you can comfortably wear a seatbelt, and you can safely maneuver your car and apply brakes.  FOLLOW-UP You should see your doctor in the office for a follow-up appointment approximately 2-3 weeks after your surgery.  You should have been given your post-op/follow-up appointment when your surgery was scheduled.  If you did not receive a post-op/follow-up appointment, make sure  that you call for this appointment within a day or two after you arrive home to insure a convenient appointment time.   WHEN TO CALL YOUR DOCTOR: Fever over 101.0 Inability to urinate Continued bleeding from incision. Increased pain, redness, or drainage from the  incision. Increasing abdominal pain  The clinic staff is available to answer your questions during regular business hours.  Please don't hesitate to call and ask to speak to one of the nurses for clinical concerns.  If you have a medical emergency, go to the nearest emergency room or call 911.  A surgeon from [facility] is always on call at the hospital. [address], [address], [place], [place]  [address] ? [address] [address], [place], [place]   [address] [phone] ? [phone] ? FAX [phone]     Managing Your Pain After Surgery Without Opioids    Thank you for participating in our program to help patients manage their pain after surgery without opioids. This is part of our effort to provide you with the best care possible, without exposing you or your family to the risk that opioids pose.  What pain can I expect after surgery? You can expect to have some pain after surgery. This is normal. The pain is typically worse the day after surgery, and quickly begins to get better. Many studies have found that many patients are able to manage their pain after surgery with Over-the-Counter (OTC) medications such as Tylenol  and Motrin. If you have a condition that does not allow you to take Tylenol  or Motrin, notify your surgical team.  How will I manage my pain? The best strategy for controlling your pain after surgery is around the clock pain control with Tylenol  (acetaminophen ) and Motrin (ibuprofen or Advil). Alternating these medications with each other allows you to maximize your pain control. In addition to Tylenol  and Motrin, you can use heating pads or ice packs on your incisions to help reduce your pain.  How will I alternate your regular strength over-the-counter pain medication? You will take a dose of pain medication every three hours. Start by taking 650 mg of Tylenol  (2 pills of 325 mg) 3 hours later take 600 mg of Motrin (3 pills of 200 mg) 3 hours after  taking the Motrin take 650 mg of Tylenol  3 hours after that take 600 mg of Motrin.   - 1 -  See example - if your first dose of Tylenol  is at 12:00 PM   12:00 PM Tylenol  650 mg (2 pills of 325 mg)  3:00 PM Motrin 600 mg (3 pills of 200 mg)  6:00 PM Tylenol  650 mg (2 pills of 325 mg)  9:00 PM Motrin 600 mg (3 pills of 200 mg)  Continue alternating every 3 hours   We recommend that you follow this schedule around-the-clock for at least 3 days after surgery, or until you feel that it is no longer needed. Use the table on the last page of this handout to keep track of the medications you are taking. Important: Do not take more than 3000mg  of Tylenol  or 3200mg  of Motrin in a 24-hour period. Do not take ibuprofen/Motrin if you have a history of bleeding stomach ulcers, severe kidney disease, &/or actively taking a blood thinner  What if I still have pain? If you have pain that is not controlled with the over-the-counter pain medications (Tylenol  and Motrin or Advil) you might have what we call "breakthrough" pain. You will receive a prescription  for a small amount of an opioid pain medication such as Oxycodone , Tramadol, or Tylenol  with Codeine. Use these opioid pills in the first 24 hours after surgery if you have breakthrough pain. Do not take more than 1 pill every 4-6 hours.  If you still have uncontrolled pain after using all opioid pills, don't hesitate to call our staff using the number provided. We will help make sure you are managing your pain in the best way possible, and if necessary, we can provide a prescription for additional pain medication.   Day 1    Time  Name of Medication Number of pills taken  Amount of Acetaminophen   Pain Level   Comments  AM PM       AM PM       AM PM       AM PM       AM PM       AM PM       AM PM       AM PM       Total Daily amount of Acetaminophen  Do not take more than  3,000 mg per day      Day 2    Time  Name of Medication  Number of pills taken  Amount of Acetaminophen   Pain Level   Comments  AM PM       AM PM       AM PM       AM PM       AM PM       AM PM       AM PM       AM PM       Total Daily amount of Acetaminophen  Do not take more than  3,000 mg per day      Day 3    Time  Name of Medication Number of pills taken  Amount of Acetaminophen   Pain Level   Comments  AM PM       AM PM       AM PM       AM PM         AM PM       AM PM       AM PM       AM PM       Total Daily amount of Acetaminophen  Do not take more than  3,000 mg per day      Day 4    Time  Name of Medication Number of pills taken  Amount of Acetaminophen   Pain Level   Comments  AM PM       AM PM       AM PM       AM PM       AM PM       AM PM       AM PM       AM PM       Total Daily amount of Acetaminophen  Do not take more than  3,000 mg per day      Day 5    Time  Name of Medication Number of pills taken  Amount of Acetaminophen   Pain Level   Comments  AM PM       AM PM       AM PM       AM PM       AM PM       AM PM  AM PM       AM PM       Total Daily amount of Acetaminophen  Do not take more than  3,000 mg per day      Day 6    Time  Name of Medication Number of pills taken  Amount of Acetaminophen   Pain Level  Comments  AM PM       AM PM       AM PM       AM PM       AM PM       AM PM       AM PM       AM PM       Total Daily amount of Acetaminophen  Do not take more than  3,000 mg per day      Day 7    Time  Name of Medication Number of pills taken  Amount of Acetaminophen   Pain Level   Comments  AM PM       AM PM       AM PM       AM PM       AM PM       AM PM       AM PM       AM PM       Total Daily amount of Acetaminophen  Do not take more than  3,000 mg per day        For additional information about how and where to safely dispose of unused opioid medications - [email or web address]  Disclaimer: This document contains  information and/or instructional materials adapted from [facility] for the typical patient with your condition. It does not replace medical advice from your health care provider because your experience may differ from that of the typical patient. Talk to your health care provider if you have any questions about this document, your condition or your treatment plan. Adapted from [facility]   Additional Discharge Instructions   Please get your medications reviewed and adjusted by your Primary MD.  Please request your Primary MD to go over all Hospital Tests and Procedure/Radiological results at the follow up, please get all Hospital records sent to your Primary MD by signing hospital release before you go home.  If you had Pneumonia of Lung problems at the Hospital: Please get a 2 view Chest X ray done in approximately 4 weeks after hospital discharge or sooner if instructed by your Primary MD.  If you have Congestive Heart Failure: Please call your Cardiologist or Primary MD anytime you have any of the following symptoms:  1) 3 pound weight gain in 24 hours or 5 pounds in 1 week  2) shortness of breath, with or without a dry hacking cough  3) swelling in the hands, feet or stomach  4) if you have to sleep on extra pillows at night in order to breathe  Follow cardiac low salt diet and 1.5 lit/day fluid restriction.  If you have diabetes Accuchecks 4 times/day, Once in AM empty stomach and then before each meal. Log in all results and show them to your primary doctor at your next visit. If any glucose reading is under 80 or above 300 call your primary MD immediately.  If you have Seizure/Convulsions/Epilepsy: Please do not drive, operate heavy machinery, participate in activities at heights or participate in high speed sports until you have seen by Primary MD or a Neurologist and advised to do so again.  Per [place] statutes, patients with seizures are not allowed  to drive until they have been seizure-free for six months.  Use caution when using heavy equipment or power tools. Avoid working on ladders or at heights. Take showers instead of baths. Ensure the water temperature is not too high on the home water heater. Do not go swimming alone. Do not lock yourself in a room alone (i.e. bathroom). When caring for infants or small children, sit down when holding, feeding, or changing them to minimize risk of injury to the child in the event you have a seizure. Maintain good sleep hygiene. Avoid alcohol.   If you had Gastrointestinal Bleeding: Please ask your Primary MD to check a complete blood count within one week of discharge or at your next visit. Your endoscopic/colonoscopic biopsies that are pending at the time of discharge, will also need to followed by your Primary MD.  Get Medicines reviewed and adjusted. Please take all your medications with you for your next visit with your Primary MD  Please request your Primary MD to go over all hospital tests and procedure/radiological results at the follow up, please ask your Primary MD to get all Hospital records sent to his/her office.  If you experience worsening of your admission symptoms, develop shortness of breath, life threatening emergency, suicidal or homicidal thoughts you must seek medical attention immediately by calling 911 or calling your MD immediately  if symptoms less severe.  You must read complete instructions/literature along with all the possible adverse reactions/side effects for all the Medicines you take and that have been prescribed to you. Take any new Medicines after you have completely understood and accpet all the possible adverse reactions/side effects.   Do not drive or operate heavy machinery when taking Pain medications.   Do not take more than prescribed Pain, Sleep and Anxiety Medications  Special Instructions: If you have smoked or chewed Tobacco  in the last 2 yrs please stop  smoking, stop any regular Alcohol  and or any Recreational drug use.  Wear Seat belts while driving.  Please note You were cared for by a hospitalist during your hospital stay. If you have any questions about your discharge medications or the care you received while you were in the hospital after you are discharged, you can call the unit and asked to speak with the hospitalist on call if the hospitalist that took care of you is not available. Once you are discharged, your primary care physician will handle any further medical issues. Please note that NO REFILLS for any discharge medications will be authorized once you are discharged, as it is imperative that you return to your primary care physician (or establish a relationship with a primary care physician if you do not have one) for your aftercare needs so that they can reassess your need for medications and monitor your lab values.  You can reach the hospitalist office at phone [phone] or fax [phone]   If you do not have a primary care physician, you can call [phone] for a physician referral.

## 2024-05-17 NOTE — TOC Transition Note (Signed)
 Transition of Care [facility]) - Discharge Note   Patient Details  Name: Chavon Lucarelli MRN: [id number] Date of Birth: 1961/02/20  [facility] CM/SW Contact:  Roxie KANDICE Stain, RN Phone Number: 05/17/2024, 4:03 PM   Clinical Narrative:    Lavene Penagos is stable to discharge home.  No TOC needs at this time.   Final next level of care: Home/Self Care Barriers to Discharge: Barriers Resolved   Patient Goals and CMS Choice Patient states their goals for this hospitalization and ongoing recovery are:: Return home          Discharge Placement                 Home      Discharge Plan and Services Additional resources added to the After Visit Summary for                                       Social Drivers of Health (SDOH) Interventions SDOH Screenings   Food Insecurity: No Food Insecurity (05/15/2024)  Housing: Low Risk  (05/15/2024)  Transportation Needs: No Transportation Needs (05/15/2024)  Utilities: Not At Risk (05/15/2024)  Alcohol Screen: Low Risk  (03/18/2024)  Depression (PHQ2-9): Medium Risk (05/04/2024)  Financial Resource Strain: Low Risk  (03/18/2024)  Physical Activity: Sufficiently Active (03/18/2024)  Social Connections: Moderately Integrated (03/18/2024)  Stress: No Stress Concern Present (03/18/2024)  Tobacco Use: Medium Risk (05/15/2024)  Health Literacy: Adequate Health Literacy (03/18/2024)     Readmission Risk Interventions     No data to display

## 2024-05-17 NOTE — Telephone Encounter (Signed)
 Copied from CRM [id number]. Topic: Appointments - Scheduling Inquiry for Clinic >> May 17, 2024  3:15 PM DeAngela L wrote: Reason for CRM: patient being discharged from hospital today, and needs a follow up in 1 week for repeat LABS (CBC BMT LFT) Pt num [phone] (H)

## 2024-05-17 NOTE — Telephone Encounter (Signed)
 I left a message for the patient to call the office back. Due to limited availability with Dr. Sherre. We can scheduled this appointment with Dr. Sirivol

## 2024-05-17 NOTE — Discharge Summary (Signed)
 Physician Discharge Summary  Allison White FMW:[id number] DOB: 04-02-1961  PCP: Sherre Clapper, MD  Admitted from: Home Discharged to: Home  Admit date: 05/14/2024 Discharge date: 05/17/2024  Recommendations for Outpatient Follow-up:    Follow-up Information     Maczis, Tonja Barban, PA-C Follow up on 06/10/2024.   Specialty: General Surgery Why: 2:00pm, Arrive 30 minutes prior to your appointment time, Please bring your insurance card and photo ID Contact information: [address] [place] [address] [facility] [place] [address] [phone]         Sherre Clapper, MD. Schedule an appointment as soon as possible for a visit in 1 week(s).   Specialty: Family Medicine Why: To be seen with repeat labs (CBC, BMP, LFTs). Contact information: [address] [place] [address] [phone]         Bensimhon, Toribio SAUNDERS, MD. Schedule an appointment as soon as possible for a visit.   Specialty: Cardiology Contact information: [address] [place] [address] [phone]                  Home Health: None    Equipment/Devices: None    Discharge Condition: Improved and stable.   Code Status: Full Code Diet recommendation:  Discharge Diet Orders (From admission, onward)     Start     Ordered   05/17/24 0000  Diet - low sodium heart healthy        05/17/24 1503             Discharge Diagnoses:  Principal Problem:   Acute cholecystitis Active Problems:   Hyperlipidemia   Chronic systolic congestive heart failure (HCC)   Coronary atherosclerosis of native coronary artery   Hypertension   Gastroesophageal reflux disease without esophagitis   Brief Hospital Course:  63 year old married female, lives in a 1 level house, medical history significant for extensive cardiac history including CAD, STEMI 11/19/2016 s/p DES to LAD and diagonal bifurcation, ICM, chronic systolic CHF, Biotronik defibrillator and Barostim  implantation, breast cancer s/p lumpectomy x 2 and radiation of her left breast, HLD, HTN, PAD, presented to the ED with complaints of abdominal pain.  She is diagnosed with acute cholecystitis.  General surgeons consulted, after Cardiology preop clearance, underwent laparoscopic cholecystectomy 6/28.  Unable to do IOC.       Assessment & Plan:    Acute cholecystitis, possible choledocholithiasis General surgeons consulted, after Cardiology preop clearance, underwent laparoscopic cholecystectomy 6/28. As per op note, it appears that they were unable to complete an IOC and there was some spillage of bile during the case. Postop has done well.  Tolerating diet, passing flatus, no BM yet.  Mild and appropriate postop pain.  Ambulating.  However transaminitis continues to worsen although mild.  As per surgical note from yesterday, high risk for choledocholithiasis as she had very small gallstones. Surgical follow-up appreciated.  Communicated with them.  They feel that LFTs are overall stable particularly total bilirubin and alkaline phosphatase.  They doubt CBD stone, have adjusted pain meds and cleared her for discharge home and I have arranged outpatient follow-up.   Hypokalemia Replaced.  Magnesium 1.8.   Chronic systolic CHF/ICM/ICD/Barostim Compensated/euvolemic Continue PTA meds including Farxiga , digoxin , Corlanor , Toprol -XL, spironolactone  and torsemide . Monitor on telemetry Creatinine has slightly increased since admission but overall stable over the last 2 days, 0.97 > 1.08 > 1.25 >1.23.   Continue to follow BMP closely as outpatient.   CAD s/p stent No anginal symptoms  Continue aspirin, Toprol -XL.   Essential hypertension Controlled on above meds, continue   Dyslipidemia Continue fenofibrate , Repatha  and Vascepa    GERD Can renew prior home med which she was getting as samples from her PCP.   Diet controlled DM2 A1c 6/17: 6.8. Treated with SSI here.   Body mass index is  33.95 kg/m./Obesity Complicates care.      Consultants:   General Surgery Cardiology   Procedures:   Laparoscopic cholecystectomy 6/28   Discharge Instructions  Discharge Instructions     (HEART FAILURE PATIENTS) Call MD:  Anytime you have any of the following symptoms: 1) 3 pound weight gain in 24 hours or 5 pounds in 1 week 2) shortness of breath, with or without a dry hacking cough 3) swelling in the hands, feet or stomach 4) if you have to sleep on extra pillows at night in order to breathe.   Complete by: As directed    Call MD for:  difficulty breathing, headache or visual disturbances   Complete by: As directed    Call MD for:  extreme fatigue   Complete by: As directed    Call MD for:  persistant dizziness or light-headedness   Complete by: As directed    Call MD for:  persistant nausea and vomiting   Complete by: As directed    Call MD for:  redness, tenderness, or signs of infection (pain, swelling, redness, odor or green/yellow discharge around incision site)   Complete by: As directed    Call MD for:  severe uncontrolled pain   Complete by: As directed    Call MD for:  temperature >100.4   Complete by: As directed    Diet - low sodium heart healthy   Complete by: As directed    Increase activity slowly   Complete by: As directed    No wound care   Complete by: As directed         Medication List     STOP taking these medications    ciprofloxacin  500 MG tablet Commonly known as: Cipro    HYDROcodone -acetaminophen  7.5-325 MG tablet Commonly known as: NORCO       TAKE these medications    acetaminophen  500 MG tablet Commonly known as: TYLENOL  Take 2 tablets (1,000 mg total) by mouth every 6 (six) hours. Can take 2 tabs (1000 mg total) every 6 hours scheduled for 3 to 5 days and after that change to 2 tabs (1000 mg total) every 6 hours as needed for mild pain, fever or headache.   aspirin EC 81 MG tablet Take 81 mg by mouth in the morning.    AZO TABS PO Take 1 tablet by mouth daily as needed (painful urination).   bismuth subsalicylate 262 MG chewable tablet Commonly known as: PEPTO BISMOL Chew 524 mg by mouth as needed for indigestion.   dapagliflozin  propanediol 10 MG Tabs tablet Commonly known as: Farxiga  Take 1 tablet (10 mg total) by mouth daily. What changed: when to take this   digoxin  0.125 MG tablet Commonly known as: LANOXIN  Take 1 tablet by mouth once daily What changed: when to take this   fenofibrate  160 MG tablet Take 1 tablet (160 mg total) by mouth daily. What changed: when to take this   ibuprofen 200 MG tablet Commonly known as: ADVIL Take 200-400 mg by mouth every 6 (six) hours as needed for moderate pain (pain score 4-6).   icosapent  Ethyl 1 g capsule Commonly known as: VASCEPA  Take 2 capsules by mouth  twice daily   ivabradine  7.5 MG Tabs tablet Commonly known as: Corlanor  Take 1 tablet (7.5 mg total) by mouth 2 (two) times daily with a meal.   loratadine 10 MG tablet Commonly known as: CLARITIN Take 10 mg by mouth in the morning. [facility] brand   methocarbamol 500 MG tablet Commonly known as: ROBAXIN Take 1 tablet (500 mg total) by mouth every 8 (eight) hours as needed for muscle spasms.   metoprolol  succinate 50 MG 24 hr tablet Commonly known as: TOPROL -XL Take 1 tablet by mouth twice daily   nitroGLYCERIN  0.4 MG SL tablet Commonly known as: NITROSTAT  Place 0.4 mg under the tongue every 5 (five) minutes as needed for chest pain.   oxyCODONE  5 MG immediate release tablet Commonly known as: Oxy IR/ROXICODONE  Take 1 tablet (5 mg total) by mouth every 4 (four) hours as needed.   raloxifene  60 MG tablet Commonly known as: EVISTA  Take 1 tablet by mouth once daily What changed: when to take this   Repatha  SureClick 140 MG/ML Soaj Generic drug: Evolocumab  INJECT 140MG  INTO THE SKIN EVERY 14 DAYS What changed: See the new instructions.   SIMETHICONE PO Take 80 mg by mouth  every 6 (six) hours as needed for flatulence.   spironolactone  25 MG tablet Commonly known as: ALDACTONE  Take 1 tablet (25 mg total) by mouth daily. What changed: when to take this   torsemide  100 MG tablet Commonly known as: DEMADEX  Take 0.5 tablets (50 mg total) by mouth daily. What changed: when to take this   Voquezna  10 MG Tabs Generic drug: Vonoprazan Fumarate  Take 10 mg by mouth daily. What changed: when to take this       Allergies  Allergen Reactions   Tape Rash    PREFERS CLOTH OR NOTHING   Plavix [Clopidogrel Bisulfate] Other (See Comments)    Joint pain   Praluent  [Alirocumab ]     Headaches, gastrointestinal issues   Statins     Myalgia, neck pain   Tamoxifen  Nausea And Vomiting      Procedures/Studies: DG Cholangiogram Operative Result Date: 05/15/2024 CLINICAL DATA:  Laparoscopic cholecystectomy with attempted intraoperative cholangiogram. EXAM: INTRAOPERATIVE CHOLANGIOGRAM TECHNIQUE: Cholangiographic images from the C-arm fluoroscopic device were submitted for interpretation post-operatively. Please see the procedural report for the amount of contrast and the fluoroscopy time utilized. FLUOROSCOPY: Radiation Exposure Index (as provided by the fluoroscopic device): 8.8 seconds 2.5491 mGy Kerma COMPARISON:  None Available. FINDINGS: Attempt was made to perform intraoperative cholangiogram in. Please see operative report for additional information. IMPRESSION: Intraoperative utilization of fluoroscopy. Electronically Signed   By: Leita Birmingham M.D.   On: 05/15/2024 15:00   CT ABDOMEN PELVIS W CONTRAST Result Date: 05/14/2024 CLINICAL DATA:  Right upper quadrant abdominal pain. EXAM: CT ABDOMEN AND PELVIS WITH CONTRAST TECHNIQUE: Multidetector CT imaging of the abdomen and pelvis was performed using the standard protocol following bolus administration of intravenous contrast. RADIATION DOSE REDUCTION: This exam was performed according to the departmental  dose-optimization program which includes automated exposure control, adjustment of the mA and/or kV according to patient size and/or use of iterative reconstruction technique. CONTRAST:  OMNIPAQUE  IOHEXOL  300 MG/ML  SOLN COMPARISON:  11/19/2016 FINDINGS: Lower chest: No acute abnormality. Hepatobiliary: Heterogeneous enhancement on the arterial phase images noted. On the portal venous phase images there is a uniform enhancement pattern of the liver without suspicious liver abnormality. Several subcentimeter low-density lesions are identified which are technically too small to reliably characterize but favored to represent small cysts. Index  lesion within segment 8/4 measures 7 mm, image 22/301. Gallbladder wall enhancement with mild thickening and pericholecystic soft tissue stranding noted. No calcified gallstones identified. There is fusiform dilatation of the common bile duct which measures 1.0 cm, image 115/601. There is mild to moderate intrahepatic bile duct dilatation. No calcified stones identified along the course of the ureter. No mass identified. Pancreas: Unremarkable. No pancreatic ductal dilatation or surrounding inflammatory changes. Spleen: Small low-density structure within the spleen measures 6 mm, image 37/310. Spleen otherwise normal. Adrenals/Urinary Tract: There is nodular thickening of both adrenal glands, unchanged from 11/19/2016 compatible with underlying adenomas. No follow-up imaging recommended. No kidney mass or obstructive uropathy. Contrast is identified within the collecting systems of both kidneys an ureters. Urinary bladder is within normal limits. Stomach/Bowel: Stomach is normal. The appendix is visualized and is normal. Diffuse colonic diverticulosis is most severe at the level of the sigmoid colon. Mild wall thickening with subtle adjacent fat stranding is identified involving the mid sigmoid colon, image 136/301. These findings may reflect sequelae of chronic  diverticular disease. Uncomplicated acute diverticulitis not excluded. Vascular/Lymphatic: Aortic atherosclerosis. No enlarged abdominal or pelvic lymph nodes. Reproductive: Status post hysterectomy. No adnexal masses. Other: No free fluid or fluid collections. No signs of pneumoperitoneum. Musculoskeletal: No acute or significant osseous findings. IMPRESSION: 1. Gallbladder wall enhancement with mild thickening and pericholecystic soft tissue stranding noted. No calcified gallstones identified. Findings are concerning for acute cholecystitis. 2. Fusiform dilatation of the common bile duct which measures 1.0 cm. There is mild to moderate intrahepatic bile duct dilatation. No calcified stones identified along the course of the ureter. No mass identified. Consider further evaluation with MRCP. 3. Diffuse colonic diverticulosis is most severe at the level of the sigmoid colon. Mild wall thickening with subtle adjacent fat stranding is identified involving the mid sigmoid colon. These findings may reflect sequelae of chronic diverticular disease. Uncomplicated mild acute diverticulitis not excluded. 4. Stable nodular thickening of both adrenal glands, unchanged from 11/19/2016 compatible with underlying adenomas. No follow-up imaging recommended. 5.  Aortic Atherosclerosis (ICD10-I70.0). Electronically Signed   By: Waddell Calk M.D.   On: 05/14/2024 11:49   US  Abdomen Limited RUQ (LIVER/GB) Result Date: 05/13/2024 CLINICAL DATA:  Right upper quadrant pain.  Nausea vomiting EXAM: ULTRASOUND ABDOMEN LIMITED RIGHT UPPER QUADRANT COMPARISON:  None Available. FINDINGS: Gallbladder: Dilated gallbladder. Dependent stones. No wall thickening or adjacent fluid. No reported Murphy's sign. Common bile duct: Diameter: 3 mm Liver: Diffusely echogenic hepatic parenchyma consistent with fatty liver infiltration. With this level of echogenicity evaluation for underlying mass lesion is limited and if needed follow-up contrast CT or  MRI as clinically appropriate. Portal vein is patent on color Doppler imaging with normal direction of blood flow towards the liver. Other: None. IMPRESSION: Fatty liver infiltration. Dilated gallbladder. Dependent stones. No further sonographic evidence of acute cholecystitis. No ductal dilatation Electronically Signed   By: Ranell Bring M.D.   On: 05/13/2024 11:31      Subjective: Tolerating diet, passing flatus, no BM yet but feels like she is going to have 1 soon.  Mild and appropriate postop pain.  Ambulating.  Has seen GI in [place] but nobody in [place].   Discharge Exam:  Vitals:   05/16/24 1933 05/16/24 2337 05/17/24 0328 05/17/24 0737  BP: 135/76 117/67 (!) 132/94 102/86  Pulse: 76 63 78 74  Resp: 17 20 19 18   Temp: 97.6 F (36.4 C) 98.1 F (36.7 C) 98.1 F (36.7 C) 98.1 F (36.7 C)  TempSrc: Oral Oral Oral Oral  SpO2: 97% 93% 91% 93%  Weight:      Height:        General exam: Middle-age female, moderately built and obese lying comfortably propped up in bed without distress.[name] Respiratory system: Clear to auscultation.  No increased work of breathing. Cardiovascular system: S1 & S2 heard, RRR. No JVD, murmurs, rubs, gallops or clicks.  Trace bilateral ankle edema.  Telemetry personally reviewed: Sinus rhythm. Gastrointestinal system: Abdomen is nondistended, soft with appropriate mild epigastric postprocedural tenderness.  Laparoscopic sites without acute findings, clean and dry.  Increased bowel sounds heard. Central nervous system: Alert and oriented. No focal neurological deficits. Extremities: Symmetric 5 x 5 power. Skin: No rashes, lesions or ulcers Psychiatry: Judgement and insight appear normal. Mood & affect appropriate.    The results of significant diagnostics from this hospitalization (including imaging, microbiology, ancillary and laboratory) are listed below for reference.     Microbiology: Recent Results (from the past 240 hours)  Urine Culture      Status: None   Collection Time: 05/14/24  9:56 AM   Specimen: Urine   UR  Result Value Ref Range Status   Urine Culture, Routine Final report  Final   Organism ID, Bacteria Comment  Final    Comment: Mixed urogenital flora 25,000-50,000 colony forming units per mL      Labs: CBC: Recent Labs  Lab 05/12/24 1114 05/14/24 1430 05/15/24 0500 05/16/24 0258 05/17/24 0239  WBC 15.1* 12.2* 6.2 10.7* 7.7  NEUTROABS 11.0* 7.5  --   --   --   HGB 16.9* 16.9* 14.6 14.9 14.4  HCT 50.1* 48.9* 43.1 45.0 43.6  MCV 95 92.6 93.1 95.5 96.7  PLT 292 292 246 214 170    Basic Metabolic Panel: Recent Labs  Lab 05/12/24 1114 05/14/24 1430 05/15/24 0500 05/16/24 0258 05/17/24 0239  NA 140 139 139 138 139  K 3.7 3.8 3.1* 4.0 3.8  CL 95* 101 99 101 103  CO2 24 25 28 25 26   GLUCOSE 144* 125* 110* 146* 124*  BUN 12 13 12 12 13   CREATININE 0.97 0.97 1.08* 1.25* 1.23*  CALCIUM  10.4* 9.5 8.5* 8.8* 9.0  MG  --   --  1.8  --   --     Liver Function Tests: Recent Labs  Lab 05/12/24 1114 05/14/24 1430 05/15/24 0500 05/16/24 0258 05/17/24 0239  AST 15 148* 104* 101* 138*  ALT 18 62* 79* 101* 169*  ALKPHOS 93 105 93 107 121  BILITOT 0.7 1.4* 1.3* 1.3* 1.4*  PROT 6.9 7.2 6.1* 6.2* 6.0*  ALBUMIN 4.3 3.9 3.3* 3.3* 3.2*    CBG: Recent Labs  Lab 05/16/24 1127 05/16/24 1623 05/16/24 2123 05/17/24 0618 05/17/24 1100  GLUCAP 118* 126* 118* 159* 146*     Urinalysis    Component Value Date/Time   COLORURINE STRAW (A) 05/14/2024 1415   APPEARANCEUR CLEAR 05/14/2024 1415   LABSPEC 1.020 05/14/2024 1415   PHURINE 5.0 05/14/2024 1415   GLUCOSEU 150 (A) 05/14/2024 1415   HGBUR NEGATIVE 05/14/2024 1415   BILIRUBINUR NEGATIVE 05/14/2024 1415   BILIRUBINUR negative 05/14/2024 0937   KETONESUR NEGATIVE 05/14/2024 1415   PROTEINUR NEGATIVE 05/14/2024 1415   UROBILINOGEN 0.2 05/14/2024 0937   UROBILINOGEN 0.2 06/10/2008 2239   NITRITE NEGATIVE 05/14/2024 1415   LEUKOCYTESUR SMALL (A)  05/14/2024 1415      Time coordinating discharge: 25 minutes  SIGNED:  Trenda Mar, MD,  FACP, SFHM, CMPC, CHCQM-PHYADV  [facility] [place]     To contact the attending provider between 7A-7P or the covering provider during after hours 7P-7A, please log into the web site [email or web address] and access using universal [place] password for that web site. If you do not have the password, please call the hospital operator.

## 2024-05-17 NOTE — Progress Notes (Signed)
 Mobility Specialist Progress Note;   05/17/24 0920  Mobility  Activity Ambulated with assistance in hallway  Level of Assistance Modified independent, requires aide device or extra time  Assistive Device None  Distance Ambulated (ft) 800 ft  Activity Response Tolerated well  Mobility Referral Yes  Mobility visit 1 Mobility  Mobility Specialist Start Time (ACUTE ONLY) 0920  Mobility Specialist Stop Time (ACUTE ONLY) 0935  Mobility Specialist Time Calculation (min) (ACUTE ONLY) 15 min   Pt agreeable to mobility. Required no physical assistance during ambulation, ModI. VSS on RA and no c/o when asked. Pt returned to bed with all needs met, call bell in reach. Husband present.   Lauraine Erm Mobility Specialist Please contact via SecureChat or [employer] [phone]

## 2024-05-17 NOTE — TOC CM/SW Note (Signed)
 Transition of Care [facility]) - Inpatient Brief Assessment   Patient Details  Name: Allison White MRN: [id number] Date of Birth: 1961/11/04  [facility] of [facility]) CM/SW Contact:    Roxie KANDICE Stain, RN Phone Number: 05/17/2024, 11:43 AM   Clinical Narrative:  POD 2, s/p lap chole Dr. Lyndel 6/28  No TOC needs at this time.  Transition of Care Asessment: Insurance and Status: Insurance coverage has been reviewed Patient has primary care physician: Yes Home environment has been reviewed: safe to discharge home Prior level of function:: independent Prior/Current Home Services: No current home services Social Drivers of Health Review: SDOH reviewed no interventions necessary Readmission risk has been reviewed: Yes Transition of care needs: no transition of care needs at this time

## 2024-05-18 ENCOUNTER — Ambulatory Visit: Payer: Self-pay | Admitting: Family Medicine

## 2024-05-18 LAB — SURGICAL PATHOLOGY

## 2024-05-24 ENCOUNTER — Ambulatory Visit (INDEPENDENT_AMBULATORY_CARE_PROVIDER_SITE_OTHER): Admitting: Physician Assistant

## 2024-05-24 ENCOUNTER — Encounter: Payer: Self-pay | Admitting: Physician Assistant

## 2024-05-24 VITALS — BP 108/62 | HR 67 | Temp 97.8°F | Ht 65.0 in | Wt 203.8 lb

## 2024-05-24 DIAGNOSIS — K5790 Diverticulosis of intestine, part unspecified, without perforation or abscess without bleeding: Secondary | ICD-10-CM | POA: Diagnosis not present

## 2024-05-24 DIAGNOSIS — K219 Gastro-esophageal reflux disease without esophagitis: Secondary | ICD-10-CM | POA: Diagnosis not present

## 2024-05-24 DIAGNOSIS — Q891 Congenital malformations of adrenal gland: Secondary | ICD-10-CM | POA: Insufficient documentation

## 2024-05-24 DIAGNOSIS — K810 Acute cholecystitis: Secondary | ICD-10-CM | POA: Diagnosis not present

## 2024-05-24 NOTE — Assessment & Plan Note (Signed)
 Worsening acid reflux despite pantoprazole  and Voquezna . Possible ulcer or bacterial cause discussed. - Consider different proton pump inhibitor if current treatment ineffective. - Discuss potential endoscopy if symptoms persist.

## 2024-05-24 NOTE — Progress Notes (Signed)
 Subjective:  Patient ID: Allison White, female    DOB: February 06, 1961  Age: 63 y.o. MRN: [id number]  Chief Complaint  Patient presents with   Hospital Follow up    HPI:  Admit date: 05/14/2024 Discharge date: 05/17/2024 Acute Cholecystitis  Patient was told to follow up with PCP for repeat CBC and CMP. Patient had gallbladder removed.   Discussed the use of AI scribe software for clinical note transcription with the patient, who gave verbal consent to proceed.  History of Present Illness   Allison White is a 63 year old female who presents with persistent abdominal pain and heartburn following gallbladder surgery.  She experiences a burning sensation across her abdomen, radiating to her back, accompanied by severe heartburn, particularly at night, causing her to wake up. The pain occurs randomly during the day, irrespective of food intake.  A CT scan prior to her gallbladder surgery revealed gallstones and possible liver fat. The gallbladder was removed, but some stones were lost and others couldn't be found. The CT scan also showed stable adrenal gland nodules since 2018.  She has a history of acid reflux for over two years, which has worsened recently. Previously on pantoprazole , which initially helped but later became less effective. She has been trying a new medication starting with 'V' for about a week without improvement. She frequently uses Pepto, Gas-X, and Vicodin, noting that Vicodin aids her sleep.  No history of stomach ulcers, vomiting, or diarrhea recently. She has a history of diverticulitis, and a colonoscopy a year ago showed no issues in the colon. She mentions a family history of stomach cancer, as her mother had it.  She has a defibrillator for a history of V-tach and was previously treated for Afib with medication, which resolved the issue. She limits her use of ibuprofen due to her heart condition and prefers Vicodin for pain management.          05/04/2024    9:30  AM 03/18/2024   10:55 AM 07/10/2023   11:07 AM 03/27/2023   11:03 AM 01/23/2023   10:38 AM  Depression screen PHQ 2/9  Decreased Interest 1 0 0 2 0  Down, Depressed, Hopeless 0 0 0 0 0  PHQ - 2 Score 1 0 0 2 0  Altered sleeping 3  2 2    Tired, decreased energy 3  3 2    Change in appetite 0  2 2   Feeling bad or failure about yourself  0  0 0   Trouble concentrating 0  0 0   Moving slowly or fidgety/restless 1  2 1    Suicidal thoughts 0  0 0   PHQ-9 Score 8  9 9    Difficult doing work/chores Not difficult at all  Somewhat difficult Somewhat difficult         05/04/2024    9:30 AM  Fall Risk   Falls in the past year? 0  Number falls in past yr: 0  Injury with Fall? 0  Risk for fall due to : No Fall Risks  Follow up Falls evaluation completed    Patient Care Team: Sherre Clapper, MD as PCP - General (Family Medicine) Inocencio Soyla Lunger, MD as PCP - Electrophysiology (Cardiology) Bensimhon, Toribio SAUNDERS, MD as PCP - Advanced Heart Failure (Cardiology) Ezzard Valaria LABOR, MD as Consulting Physician (Oncology) Jomarie Agent, MD as Consulting Physician (Radiation Oncology) Nyle Rankin POUR, [facility] (Inactive) (Pharmacist) Dolphus Carrion, MD as Consulting Physician (Interventional Radiology) Ezzard Valaria LABOR, MD as Consulting  Physician (Oncology) Vannie Elsie HERO, OD (Ophthalmology) Misenheimer, Evalene, MD as Consulting Physician (Unknown Physician Specialty) Lesia Ozell Barter, PA-C as Physician Assistant (Cardiology)   Review of Systems  Constitutional:  Negative for appetite change, fatigue and fever.  HENT:  Negative for congestion, ear pain, sinus pressure and sore throat.   Respiratory:  Negative for cough, chest tightness, shortness of breath and wheezing.   Cardiovascular:  Negative for chest pain and palpitations.  Gastrointestinal:  Negative for abdominal pain, constipation, diarrhea, nausea and vomiting.  Genitourinary:  Negative for dysuria and hematuria.   Musculoskeletal:  Negative for arthralgias, back pain, joint swelling and myalgias.  Skin:  Negative for rash.  Neurological:  Negative for dizziness, weakness and headaches.  Psychiatric/Behavioral:  Negative for dysphoric mood. The patient is not nervous/anxious.     Current Outpatient Medications on File Prior to Visit  Medication Sig Dispense Refill   acetaminophen  (TYLENOL ) 500 MG tablet Take 2 tablets (1,000 mg total) by mouth every 6 (six) hours. Can take 2 tabs (1000 mg total) every 6 hours scheduled for 3 to 5 days and after that change to 2 tabs (1000 mg total) every 6 hours as needed for mild pain, fever or headache.     aspirin  81 MG EC tablet Take 81 mg by mouth in the morning.     bismuth subsalicylate (PEPTO BISMOL) 262 MG chewable tablet Chew 524 mg by mouth as needed for indigestion.     dapagliflozin  propanediol (FARXIGA ) 10 MG TABS tablet Take 1 tablet (10 mg total) by mouth daily. (Patient taking differently: Take 10 mg by mouth in the morning.) 90 tablet 3   digoxin  (LANOXIN ) 0.125 MG tablet Take 1 tablet by mouth once daily (Patient taking differently: Take 0.125 mg by mouth at bedtime.) 90 tablet 3   Evolocumab  (REPATHA  SURECLICK) 140 MG/ML SOAJ INJECT 140MG  INTO THE SKIN EVERY 14 DAYS (Patient taking differently: Inject 140 mg into the skin every 14 (fourteen) days.) 2 mL 2   fenofibrate  160 MG tablet Take 1 tablet (160 mg total) by mouth daily. (Patient taking differently: Take 160 mg by mouth in the morning.) 90 tablet 1   ibuprofen (ADVIL) 200 MG tablet Take 200-400 mg by mouth every 6 (six) hours as needed for moderate pain (pain score 4-6).     icosapent  Ethyl (VASCEPA ) 1 g capsule Take 2 capsules by mouth twice daily 120 capsule 0   ivabradine  (CORLANOR ) 7.5 MG TABS tablet Take 1 tablet (7.5 mg total) by mouth 2 (two) times daily with a meal. 180 tablet 3   loratadine  (CLARITIN ) 10 MG tablet Take 10 mg by mouth in the morning. [facility] brand     methocarbamol   (ROBAXIN ) 500 MG tablet Take 1 tablet (500 mg total) by mouth every 8 (eight) hours as needed for muscle spasms. 30 tablet 0   metoprolol  succinate (TOPROL -XL) 50 MG 24 hr tablet Take 1 tablet by mouth twice daily 180 tablet 3   nitroGLYCERIN  (NITROSTAT ) 0.4 MG SL tablet Place 0.4 mg under the tongue every 5 (five) minutes as needed for chest pain.     omeprazole  (PRILOSEC OTC) 20 MG tablet Take 20 mg by mouth daily.     oxyCODONE  (OXY IR/ROXICODONE ) 5 MG immediate release tablet Take 1 tablet (5 mg total) by mouth every 4 (four) hours as needed. 15 tablet 0   Phenazopyridine HCl (AZO TABS PO) Take 1 tablet by mouth daily as needed (painful urination).     raloxifene  (EVISTA ) 60 MG tablet  Take 1 tablet by mouth once daily (Patient taking differently: Take 60 mg by mouth at bedtime.) 90 tablet 3   SIMETHICONE PO Take 80 mg by mouth every 6 (six) hours as needed for flatulence.     spironolactone  (ALDACTONE ) 25 MG tablet Take 1 tablet (25 mg total) by mouth daily. (Patient taking differently: Take 25 mg by mouth in the morning.) 90 tablet 0   torsemide  (DEMADEX ) 100 MG tablet Take 0.5 tablets (50 mg total) by mouth daily. (Patient taking differently: Take 50 mg by mouth in the morning.) 45 tablet 3   Vonoprazan Fumarate  (VOQUEZNA ) 10 MG TABS Take 10 mg by mouth daily. (Patient taking differently: Take 10 mg by mouth in the morning.)     No current facility-administered medications on file prior to visit.   Past Medical History:  Diagnosis Date   AICD (automatic cardioverter/defibrillator) present 02/25/2017   Biotronik- Dual PPM/AICD   Arthritis    Cancer (HCC) 2022   Rt Breast   CHF (congestive heart failure) (HCC)    Coronary artery disease    Diabetes mellitus without complication (HCC)    Pt denies having diabetes   Fibromuscular dysplasia (HCC)    FMD (facioscapulohumeral muscular dystrophy) (HCC)    Headache(784.0)    Heart failure (HCC)    Hyperlipidemia    MI (myocardial  infarction) (HCC) 11/19/2016   s/p DES LAD   Migraine    Nonruptured cerebral aneurysm, internal carotid artery 07/14/2008   Left ICA stent placed for pseudoaneurysm, likely from dissection related to fibromuscular dysplasia009)   Palpitations    PONV (postoperative nausea and vomiting)    Pseudoaneurysm (HCC)    both carotids    Stroke (HCC) 06/10/2008   when carotid artery dissected, she was told she had a stroke (pt states she has had dissection on both sides); s/p left ICA stent 07/14/08   Vertigo    Past Surgical History:  Procedure Laterality Date   ABDOMINAL HYSTERECTOMY  08/16/2004   Uterus and Cervix (per pathology)   BAROREFLEX SYSTEM INSERTION  11/14/2023   BREAST LUMPECTOMY Right 07/2021   CARDIAC DEFIBRILLATOR PLACEMENT     CEREBRAL ANEURYSM REPAIR Left 2009   CESAREAN SECTION     CHOLECYSTECTOMY N/A 05/15/2024   Procedure: LAPAROSCOPIC CHOLECYSTECTOMY WITH INTRAOPERATIVE CHOLANGIOGRAM;  Surgeon: Lyndel Deward PARAS, MD;  Location: [facility] OR;  Service: General;  Laterality: N/A;   CORONARY ANGIOPLASTY WITH STENT PLACEMENT     RIGHT/LEFT HEART CATH AND CORONARY ANGIOGRAPHY N/A 04/07/2020   Procedure: RIGHT/LEFT HEART CATH AND CORONARY ANGIOGRAPHY;  Surgeon: Cherrie Toribio SAUNDERS, MD;  Location: [facility] INVASIVE CV LAB;  Service: Cardiovascular;  Laterality: N/A;   TUBAL LIGATION      Family History  Problem Relation Age of Onset   Thyroid  disease Mother    Gastric cancer Mother 53       w/ signet ring features   Prostate cancer Father        d. 11   Hypertension Brother    Breast cancer Maternal Aunt 110   Brain cancer Maternal Aunt 84   Prostate cancer Paternal Uncle        x2 pat uncles   Prostate cancer Maternal Grandfather        metastatic; d. 37   Cerebral aneurysm Paternal Grandmother        Nonruptured   Dementia Paternal Grandfather    Prostate cancer Paternal Grandfather        metastatic   Social History   Socioeconomic  History   Marital status: Married     Spouse name: Tim   Number of children: 4   Years of education: Not on file   Highest education level: High school graduate  Occupational History   Not on file  Tobacco Use   Smoking status: Former    Current packs/day: 0.00    Average packs/day: 1 pack/day for 6.0 years (6.0 ttl pk-yrs)    Types: Cigarettes    Quit date: 79    Years since quitting: 42.5   Smokeless tobacco: Never   Tobacco comments:    Quit over 30 years ago  Vaping Use   Vaping status: Never Used  Substance and Sexual Activity   Alcohol use: Not Currently   Drug use: No   Sexual activity: Not Currently  Other Topics Concern   Not on file  Social History Narrative   Lives with husband in a one-story home.     Right handed   Caffeine: very little   Social Drivers of Corporate investment banker Strain: Low Risk  (03/18/2024)   Overall Financial Resource Strain (CARDIA)    Difficulty of Paying Living Expenses: Not hard at all  Food Insecurity: No Food Insecurity (05/15/2024)   Hunger Vital Sign    Worried About Running Out of Food in the Last Year: Never true    Ran Out of Food in the Last Year: Never true  Transportation Needs: No Transportation Needs (05/15/2024)   PRAPARE - Administrator, Civil Service (Medical): No    Lack of Transportation (Non-Medical): No  Physical Activity: Sufficiently Active (03/18/2024)   Exercise Vital Sign    Days of Exercise per Week: 5 days    Minutes of Exercise per Session: 30 min  Stress: No Stress Concern Present (03/18/2024)   [employer] of Occupational Health - Occupational Stress Questionnaire    Feeling of Stress : Not at all  Social Connections: Moderately Integrated (03/18/2024)   Social Connection and Isolation Panel    Frequency of Communication with Friends and Family: More than three times a week    Frequency of Social Gatherings with Friends and Family: Three times a week    Attends Religious Services: More than 4 times per year     Active Member of Clubs or Organizations: No    Attends Banker Meetings: Never    Marital Status: Married    Objective:  BP 108/62 (BP Location: Right Arm, Patient Position: Sitting)   Pulse 67   Temp 97.8 F (36.6 C) (Temporal)   Ht 5' 5 (1.651 m)   Wt 203 lb 12.8 oz (92.4 kg)   SpO2 97%   BMI 33.91 kg/m      05/24/2024    1:27 PM 05/17/2024    7:37 AM 05/17/2024    3:28 AM  BP/Weight  Systolic BP 108 102 132  Diastolic BP 62 86 94  Wt. (Lbs) 203.8    BMI 33.91 kg/m2      Physical Exam Vitals reviewed.  Constitutional:      Appearance: Normal appearance.  Cardiovascular:     Rate and Rhythm: Normal rate and regular rhythm.     Heart sounds: Normal heart sounds.  Pulmonary:     Effort: Pulmonary effort is normal.     Breath sounds: Normal breath sounds.  Abdominal:     General: Bowel sounds are normal.     Palpations: Abdomen is soft.     Tenderness: There is abdominal tenderness  in the epigastric area.      Comments: Surgical sites healing well with no active concern for infection  Neurological:     Mental Status: She is alert and oriented to person, place, and time.  Psychiatric:        Mood and Affect: Mood normal.        Behavior: Behavior normal.       Lab Results  Component Value Date   WBC 7.7 05/17/2024   HGB 14.4 05/17/2024   HCT 43.6 05/17/2024   PLT 170 05/17/2024   GLUCOSE 124 (H) 05/17/2024   CHOL 157 05/04/2024   TRIG 347 (H) 05/04/2024   HDL 47 05/04/2024   LDLCALC 57 05/04/2024   ALT 169 (H) 05/17/2024   AST 138 (H) 05/17/2024   NA 139 05/17/2024   K 3.8 05/17/2024   CL 103 05/17/2024   CREATININE 1.23 (H) 05/17/2024   BUN 13 05/17/2024   CO2 26 05/17/2024   TSH 3.720 12/24/2022   INR 1.1 11/07/2023   HGBA1C 6.8 (H) 05/04/2024      Assessment & Plan:  Acute cholecystitis Assessment & Plan: Persistent abdominal pain post-gallbladder removal, possibly related to residual gallstones or gastrointestinal issues.  Previous CT showed gallbladder wall thickening and common bile duct dilation. - Order CBC, CMP, and liver function tests. - Consider gastroenterologist referral if labs normal and pain persists.  Orders: -     CBC with Differential/Platelet -     Comprehensive metabolic panel with GFR  Diverticulosis Assessment & Plan: Colonic diverticulosis with mild wall thickening and fat stranding. Pain atypical for diverticulitis but may contribute to symptoms. - Advise mostly liquid diet and avoid high fiber foods during flare-ups.   Gastroesophageal reflux disease without esophagitis Assessment & Plan: Worsening acid reflux despite pantoprazole  and Voquezna . Possible ulcer or bacterial cause discussed. - Consider different proton pump inhibitor if current treatment ineffective. - Discuss potential endoscopy if symptoms persist.   Anomaly, adrenal gland Assessment & Plan: Benign adrenal gland thickening, stable since 2018 No need for further monitoring     General Health Maintenance Dietary modifications post-cholecystectomy to prevent gastrointestinal upset. - Advise avoiding fatty and greasy foods.  Follow-up Scheduled follow-up with cardiologist for heart-related concerns. Regular follow-ups for chronic condition management. - Follow up with cardiologist next week. - Continue regular follow-ups with primary care every three months.      No orders of the defined types were placed in this encounter.   Orders Placed This Encounter  Procedures   CBC with Differential/Platelet   Comprehensive metabolic panel with GFR     Follow-up: No follow-ups on file.   I,Lauren M Auman,acting as a Neurosurgeon for [employer], PA.,have documented all relevant documentation on the behalf of Nola Angles, PA,as directed by  Nola Angles, PA while in the presence of Nola Angles, [place].   An After Visit Summary was printed and given to the patient.  Nola Angles, [place] [facility] [phone]

## 2024-05-24 NOTE — Assessment & Plan Note (Signed)
 Persistent abdominal pain post-gallbladder removal, possibly related to residual gallstones or gastrointestinal issues. Previous CT showed gallbladder wall thickening and common bile duct dilation. - Order CBC, CMP, and liver function tests. - Consider gastroenterologist referral if labs normal and pain persists.

## 2024-05-24 NOTE — Assessment & Plan Note (Signed)
 Colonic diverticulosis with mild wall thickening and fat stranding. Pain atypical for diverticulitis but may contribute to symptoms. - Advise mostly liquid diet and avoid high fiber foods during flare-ups.

## 2024-05-24 NOTE — Assessment & Plan Note (Signed)
 Benign adrenal gland thickening, stable since 2018 No need for further monitoring

## 2024-05-25 LAB — CBC WITH DIFFERENTIAL/PLATELET
Basophils Absolute: 0 x10E3/uL (ref 0.0–0.2)
Basos: 1 %
EOS (ABSOLUTE): 0.2 x10E3/uL (ref 0.0–0.4)
Eos: 3 %
Hematocrit: 45.6 % (ref 34.0–46.6)
Hemoglobin: 15.2 g/dL (ref 11.1–15.9)
Immature Grans (Abs): 0 x10E3/uL (ref 0.0–0.1)
Immature Granulocytes: 0 %
Lymphocytes Absolute: 1.9 x10E3/uL (ref 0.7–3.1)
Lymphs: 29 %
MCH: 32.3 pg (ref 26.6–33.0)
MCHC: 33.3 g/dL (ref 31.5–35.7)
MCV: 97 fL (ref 79–97)
Monocytes Absolute: 0.7 x10E3/uL (ref 0.1–0.9)
Monocytes: 11 %
Neutrophils Absolute: 3.6 x10E3/uL (ref 1.4–7.0)
Neutrophils: 55 %
Platelets: 274 x10E3/uL (ref 150–450)
RBC: 4.7 x10E6/uL (ref 3.77–5.28)
RDW: 12.8 % (ref 11.7–15.4)
WBC: 6.5 x10E3/uL (ref 3.4–10.8)

## 2024-05-25 LAB — COMPREHENSIVE METABOLIC PANEL WITH GFR
ALT: 196 IU/L — ABNORMAL HIGH (ref 0–32)
AST: 124 IU/L — ABNORMAL HIGH (ref 0–40)
Albumin: 4 g/dL (ref 3.9–4.9)
Alkaline Phosphatase: 218 IU/L — ABNORMAL HIGH (ref 44–121)
BUN/Creatinine Ratio: 17 (ref 12–28)
BUN: 14 mg/dL (ref 8–27)
Bilirubin Total: 0.5 mg/dL (ref 0.0–1.2)
CO2: 22 mmol/L (ref 20–29)
Calcium: 9.7 mg/dL (ref 8.7–10.3)
Chloride: 105 mmol/L (ref 96–106)
Creatinine, Ser: 0.83 mg/dL (ref 0.57–1.00)
Globulin, Total: 2.2 g/dL (ref 1.5–4.5)
Glucose: 98 mg/dL (ref 70–99)
Potassium: 4.8 mmol/L (ref 3.5–5.2)
Sodium: 139 mmol/L (ref 134–144)
Total Protein: 6.2 g/dL (ref 6.0–8.5)
eGFR: 80 mL/min/1.73

## 2024-05-26 ENCOUNTER — Encounter: Payer: Self-pay | Admitting: Physician Assistant

## 2024-05-26 ENCOUNTER — Ambulatory Visit: Payer: Self-pay | Admitting: Physician Assistant

## 2024-05-26 DIAGNOSIS — R1013 Epigastric pain: Secondary | ICD-10-CM

## 2024-05-28 ENCOUNTER — Telehealth: Payer: Self-pay

## 2024-05-28 ENCOUNTER — Encounter: Payer: Self-pay | Admitting: Family Medicine

## 2024-05-28 ENCOUNTER — Other Ambulatory Visit: Payer: Self-pay | Admitting: Family Medicine

## 2024-05-28 DIAGNOSIS — I25119 Atherosclerotic heart disease of native coronary artery with unspecified angina pectoris: Secondary | ICD-10-CM

## 2024-05-28 MED ORDER — OXYCODONE HCL 5 MG PO TABS: 5.0000 mg | ORAL_TABLET | ORAL | 0 refills | Status: DC | PRN

## 2024-05-28 NOTE — Telephone Encounter (Signed)
 Patient informed. Samples available for pick up.

## 2024-05-28 NOTE — Telephone Encounter (Signed)
 Copied from CRM [id number]. Topic: Clinical - Prescription Issue >> May 28, 2024  8:56 AM Wess RAMAN wrote: Reason for CRM: Patient states she needs Hydrocodone  and not oxyCODONE  (OXY IR/ROXICODONE ) 5 MG immediate release tablet. She's stated the hospital prescribed the wrong medication.  Callback #: [phone]  Preferred Pharmacy: [facility] [address] [place] - [address] [place] [address] Phone: [phone] Fax: [phone] Hours: Not open 24 hours

## 2024-05-31 ENCOUNTER — Other Ambulatory Visit: Payer: Self-pay

## 2024-05-31 DIAGNOSIS — R748 Abnormal levels of other serum enzymes: Secondary | ICD-10-CM

## 2024-05-31 NOTE — Progress Notes (Signed)
 Acute Office Visit  Subjective:    Patient ID: Allison White, female    DOB: 06-02-1961, 63 y.o.   MRN: [id number]  Chief Complaint  Patient presents with   Abdominal Pain    HPI: History of Present Illness The patient presents with persistent abdominal and back pain following recent surgery.  Abdominal pain and dyspepsia - Patient is in today for upper abdominal pain that radiates at times to the RUQ.  - At times only drinking water causes pain.  - Eating in small amounts helps some.  - These issues have been constant since having her gallbladder removed.  - Voquezna  20 mg daily has helped some. - Eating small portions more frequent helps some. But she can drink a 8 oz water and it causes pain.  - Persistent severe abdominal pain described as heartburn with burning sensations in both lower and upper abdomen - Pain radiates to the back, particularly across the shoulder blades - Pain triggered by eating small amounts of food or drinking even half a small bottle of water - No return visit or contact with the surgeon regarding these symptoms  Nausea and gastrointestinal symptoms - Nausea present without vomiting - Black stools attributed to Pepto-Bismol use - No constipation or issues with bowel movements  Postoperative course and surgical complications - Recent surgery with some stones unlocated and unsuccessful dye test - Concern about cardiac status during surgery, resulting in referral to cardiology - Recent blood work revealed elevated liver enzymes  Analgesic use and pain management - Hydrocodone  used postoperatively, initially requiring two pills at a time for adequate pain control - Oxycodone  prescribed but not filled - Ibuprofen (two 200 mg tablets daily) and Tylenol  used for back pain - Back pain attributed to sleeping on the couch in a sitting position  Past Medical History:  Diagnosis Date   AICD (automatic cardioverter/defibrillator) present 02/25/2017    Biotronik- Dual PPM/AICD   Arthritis    Cancer (HCC) 2022   Rt Breast   CHF (congestive heart failure) (HCC)    Coronary artery disease    Diabetes mellitus without complication (HCC)    Pt denies having diabetes   Fibromuscular dysplasia (HCC)    FMD (facioscapulohumeral muscular dystrophy) (HCC)    Headache(784.0)    Heart failure (HCC)    Hyperlipidemia    MI (myocardial infarction) (HCC) 11/19/2016   s/p DES LAD   Migraine    Nonruptured cerebral aneurysm, internal carotid artery 07/14/2008   Left ICA stent placed for pseudoaneurysm, likely from dissection related to fibromuscular dysplasia009)   Palpitations    PONV (postoperative nausea and vomiting)    Pseudoaneurysm (HCC)    both carotids    Stroke (HCC) 06/10/2008   when carotid artery dissected, she was told she had a stroke (pt states she has had dissection on both sides); s/p left ICA stent 07/14/08   Vertigo     Past Surgical History:  Procedure Laterality Date   ABDOMINAL HYSTERECTOMY  08/16/2004   Uterus and Cervix (per pathology)   BAROREFLEX SYSTEM INSERTION  11/14/2023   BREAST LUMPECTOMY Right 07/2021   CARDIAC DEFIBRILLATOR PLACEMENT     CEREBRAL ANEURYSM REPAIR Left 2009   CESAREAN SECTION     CHOLECYSTECTOMY N/A 05/15/2024   Procedure: LAPAROSCOPIC CHOLECYSTECTOMY WITH INTRAOPERATIVE CHOLANGIOGRAM;  Surgeon: Lyndel Deward PARAS, MD;  Location: [facility] OR;  Service: General;  Laterality: N/A;   CORONARY ANGIOPLASTY WITH STENT PLACEMENT     RIGHT/LEFT HEART CATH AND CORONARY ANGIOGRAPHY N/A 04/07/2020  Procedure: RIGHT/LEFT HEART CATH AND CORONARY ANGIOGRAPHY;  Surgeon: Cherrie Toribio SAUNDERS, MD;  Location: [facility];  Service: Cardiovascular;  Laterality: N/A;   TUBAL LIGATION      Family History  Problem Relation Age of Onset   Thyroid  disease Mother    Gastric cancer Mother 72       w/ signet ring features   Prostate cancer Father        d. 27   Hypertension Brother    Breast cancer  Maternal Aunt 78   Brain cancer Maternal Aunt 65   Prostate cancer Paternal Uncle        x2 pat uncles   Prostate cancer Maternal Grandfather        metastatic; d. 53   Cerebral aneurysm Paternal Grandmother        Nonruptured   Dementia Paternal Grandfather    Prostate cancer Paternal Grandfather        metastatic    Social History   Socioeconomic History   Marital status: Married    Spouse name: Tim   Number of children: 4   Years of education: Not on file   Highest education level: High school graduate  Occupational History   Not on file  Tobacco Use   Smoking status: Former    Current packs/day: 0.00    Average packs/day: 1 pack/day for 6.0 years (6.0 ttl pk-yrs)    Types: Cigarettes    Quit date: 69    Years since quitting: 42.5   Smokeless tobacco: Never   Tobacco comments:    Quit over 30 years ago  Vaping Use   Vaping status: Never Used  Substance and Sexual Activity   Alcohol use: Not Currently   Drug use: No   Sexual activity: Not Currently  Other Topics Concern   Not on file  Social History Narrative   Lives with husband in a [place] home.     Right handed   Caffeine: very little   Social Drivers of Corporate investment banker Strain: Low Risk  (03/18/2024)   Overall Financial Resource Strain (CARDIA)    Difficulty of Paying Living Expenses: Not hard at all  Food Insecurity: No Food Insecurity (05/15/2024)   Hunger Vital Sign    Worried About Running Out of Food in the Last Year: Never true    Ran Out of Food in the Last Year: Never true  Transportation Needs: No Transportation Needs (05/15/2024)   PRAPARE - Administrator, Civil Service (Medical): No    Lack of Transportation (Non-Medical): No  Physical Activity: Sufficiently Active (03/18/2024)   Exercise Vital Sign    Days of Exercise per Week: 5 days    Minutes of Exercise per Session: 30 min  Stress: No Stress Concern Present (03/18/2024)   [employer] of Occupational  Health - Occupational Stress Questionnaire    Feeling of Stress : Not at all  Social Connections: Moderately Integrated (03/18/2024)   Social Connection and Isolation Panel    Frequency of Communication with Friends and Family: More than three times a week    Frequency of Social Gatherings with Friends and Family: Three times a week    Attends Religious Services: More than 4 times per year    Active Member of Clubs or Organizations: No    Attends Banker Meetings: Never    Marital Status: Married  Catering manager Violence: Not At Risk (05/15/2024)   Humiliation, Afraid, Rape, and Kick questionnaire  Fear of Current or Ex-Partner: No    Emotionally Abused: No    Physically Abused: No    Sexually Abused: No    Outpatient Medications Prior to Visit  Medication Sig Dispense Refill   acetaminophen  (TYLENOL ) 500 MG tablet Take 2 tablets (1,000 mg total) by mouth every 6 (six) hours. Can take 2 tabs (1000 mg total) every 6 hours scheduled for 3 to 5 days and after that change to 2 tabs (1000 mg total) every 6 hours as needed for mild pain, fever or headache.     aspirin  81 MG EC tablet Take 81 mg by mouth in the morning.     bismuth subsalicylate (PEPTO BISMOL) 262 MG chewable tablet Chew 524 mg by mouth as needed for indigestion.     dapagliflozin  propanediol (FARXIGA ) 10 MG TABS tablet Take 1 tablet (10 mg total) by mouth daily. 90 tablet 3   digoxin  (LANOXIN ) 0.125 MG tablet Take 1 tablet by mouth once daily 90 tablet 3   Evolocumab  (REPATHA  SURECLICK) 140 MG/ML SOAJ INJECT 140MG  INTO THE SKIN EVERY 14 DAYS 2 mL 2   fenofibrate  160 MG tablet Take 1 tablet (160 mg total) by mouth daily. 90 tablet 1   ibuprofen (ADVIL) 200 MG tablet Take 200-400 mg by mouth every 6 (six) hours as needed for moderate pain (pain score 4-6).     icosapent  Ethyl (VASCEPA ) 1 g capsule Take 2 capsules by mouth twice daily 120 capsule 0   ivabradine  (CORLANOR ) 7.5 MG TABS tablet Take 1 tablet (7.5 mg  total) by mouth 2 (two) times daily with a meal. 180 tablet 3   loratadine  (CLARITIN ) 10 MG tablet Take 10 mg by mouth in the morning. [facility] brand     methocarbamol  (ROBAXIN ) 500 MG tablet Take 1 tablet (500 mg total) by mouth every 8 (eight) hours as needed for muscle spasms. 30 tablet 0   metoprolol  succinate (TOPROL -XL) 50 MG 24 hr tablet Take 1 tablet by mouth twice daily 180 tablet 3   nitroGLYCERIN  (NITROSTAT ) 0.4 MG SL tablet Place 0.4 mg under the tongue every 5 (five) minutes as needed for chest pain.     oxyCODONE  (OXY IR/ROXICODONE ) 5 MG immediate release tablet Take 1 tablet (5 mg total) by mouth every 4 (four) hours as needed. 15 tablet 0   Phenazopyridine HCl (AZO TABS PO) Take 1 tablet by mouth daily as needed (painful urination).     raloxifene  (EVISTA ) 60 MG tablet Take 1 tablet by mouth once daily 90 tablet 3   SIMETHICONE PO Take 80 mg by mouth every 6 (six) hours as needed for flatulence.     spironolactone  (ALDACTONE ) 25 MG tablet Take 1 tablet (25 mg total) by mouth daily. 90 tablet 0   omeprazole  (PRILOSEC OTC) 20 MG tablet Take 20 mg by mouth daily.     torsemide  (DEMADEX ) 100 MG tablet Take 0.5 tablets (50 mg total) by mouth daily. 45 tablet 3   Vonoprazan Fumarate  (VOQUEZNA ) 10 MG TABS Take 10 mg by mouth daily. (Patient taking differently: Take 10 mg by mouth in the morning.)     No facility-administered medications prior to visit.    Allergies  Allergen Reactions   Tape Rash    PREFERS CLOTH OR NOTHING   Plavix [Clopidogrel Bisulfate] Other (See Comments)    Joint pain   Praluent  [Alirocumab ]     Headaches, gastrointestinal issues   Statins     Myalgia, neck pain   Tamoxifen  Nausea And Vomiting  Review of Systems  Constitutional:  Negative for appetite change, chills, fatigue and fever.  HENT:  Positive for congestion. Negative for sore throat.   Respiratory:  Negative for cough, shortness of breath and wheezing.   Cardiovascular:  Negative for  chest pain and palpitations.  Gastrointestinal:  Positive for abdominal pain and nausea. Negative for constipation, diarrhea and vomiting.  Skin:  Negative for rash.       Objective:        06/02/2024    9:23 AM 06/01/2024    2:41 PM 05/24/2024    1:27 PM  Vitals with BMI  Height 5' 5 5' 5 5' 5  Weight 203 lbs 200 lbs 203 lbs 13 oz  BMI 33.78 33.28 33.91  Systolic 122 124 891  Diastolic 80 72 62  Pulse 65 70 67    No data found.   Physical Exam Vitals reviewed.  Constitutional:      Appearance: Normal appearance.  Neck:     Vascular: No carotid bruit.  Cardiovascular:     Rate and Rhythm: Normal rate and regular rhythm.     Heart sounds: Normal heart sounds.  Pulmonary:     Effort: Pulmonary effort is normal. No respiratory distress.     Breath sounds: Normal breath sounds.  Abdominal:     General: Abdomen is flat. Bowel sounds are normal.     Palpations: Abdomen is soft.     Tenderness: There is abdominal tenderness (epigastric mild.).  Neurological:     Mental Status: She is alert and oriented to person, place, and time.  Psychiatric:        Mood and Affect: Mood normal.        Behavior: Behavior normal.     Health Maintenance Due  Topic Date Due   DTaP/Tdap/Td (1 - Tdap) Never done   Zoster Vaccines- Shingrix (1 of 2) Never done   COVID-19 Vaccine (4 - Pfizer risk 2024-25 season) 04/26/2024    There are no preventive care reminders to display for this patient.   Lab Results  Component Value Date   TSH 3.720 12/24/2022   Lab Results  Component Value Date   WBC 7.0 06/01/2024   HGB 15.9 06/01/2024   HCT 46.7 (H) 06/01/2024   MCV 96 06/01/2024   PLT 309 06/01/2024   Lab Results  Component Value Date   NA 138 06/02/2024   K 4.2 06/02/2024   CO2 25 06/02/2024   GLUCOSE 125 (H) 06/02/2024   BUN 16 06/02/2024   CREATININE 1.06 (H) 06/02/2024   BILITOT 0.4 06/01/2024   ALKPHOS 145 (H) 06/01/2024   AST 27 06/01/2024   ALT 62 (H) 06/01/2024    PROT 6.8 06/01/2024   ALBUMIN 4.5 06/01/2024   CALCIUM  9.6 06/02/2024   ANIONGAP 10 06/02/2024   EGFR 50 (L) 06/01/2024   GFR 71.82 07/22/2013   Lab Results  Component Value Date   CHOL 157 05/04/2024   Lab Results  Component Value Date   HDL 47 05/04/2024   Lab Results  Component Value Date   LDLCALC 57 05/04/2024   Lab Results  Component Value Date   TRIG 347 (H) 05/04/2024   Lab Results  Component Value Date   CHOLHDL 3.3 05/04/2024   Lab Results  Component Value Date   HGBA1C 6.8 (H) 05/04/2024       Assessment & Plan:  Epigastric abdominal pain Assessment & Plan: Persistent post-surgical abdominal pain with possible unresolved gallstones or post-surgical complications. Elevated liver enzymes  suggest liver involvement. - Repeat blood work to assess liver enzymes. - Consult with surgeon regarding unresolved gallstones and post-surgical complications. - Encouraged follow-up with surgeon.  Orders: -     Voquezna ; Take 20 mg by mouth daily.  Dispense: 30 tablet; Refill: 3  Chronic right-sided low back pain without sciatica Assessment & Plan: Mechanical lower back pain due to poor sleeping posture. - Continue ibuprofen and acetaminophen  as needed.      Meds ordered this encounter  Medications   DISCONTD: HYDROcodone -acetaminophen  (NORCO) 7.5-325 MG tablet    Sig: Take 1 tablet by mouth every 6 (six) hours as needed for moderate pain (pain score 4-6).    Dispense:  30 tablet    Refill:  0    Patient needed this rather than oxycodone .   Vonoprazan Fumarate  (VOQUEZNA ) 20 MG TABS    Sig: Take 20 mg by mouth daily.    Dispense:  30 tablet    Refill:  3    No orders of the defined types were placed in this encounter.   Recording duration: 9 minutes Follow-up: Return if symptoms worsen or fail to improve.  An After Visit Summary was printed and given to the patient.  I,Lauren M Auman,acting as a scribe for Abigail Free, MD.,have documented all relevant  documentation on the behalf of Abigail Free, MD,as directed by  Abigail Free, MD while in the presence of Abigail Free, MD.   I attest that I have reviewed this visit and agree with the plan scribed by my staff.  Abigail Free, MD [facility] [phone]

## 2024-06-01 ENCOUNTER — Ambulatory Visit (INDEPENDENT_AMBULATORY_CARE_PROVIDER_SITE_OTHER): Admitting: Family Medicine

## 2024-06-01 VITALS — BP 124/72 | HR 70 | Temp 98.0°F | Ht 65.0 in | Wt 200.0 lb

## 2024-06-01 DIAGNOSIS — M545 Low back pain, unspecified: Secondary | ICD-10-CM | POA: Diagnosis not present

## 2024-06-01 DIAGNOSIS — G8929 Other chronic pain: Secondary | ICD-10-CM | POA: Diagnosis not present

## 2024-06-01 DIAGNOSIS — R1013 Epigastric pain: Secondary | ICD-10-CM | POA: Diagnosis not present

## 2024-06-01 MED ORDER — VOQUEZNA 20 MG PO TABS: 20.0000 mg | ORAL_TABLET | Freq: Every day | ORAL | 3 refills | Status: DC

## 2024-06-01 MED ORDER — HYDROCODONE-ACETAMINOPHEN 7.5-325 MG PO TABS: 1.0000 | ORAL_TABLET | Freq: Four times a day (QID) | ORAL | 0 refills | Status: DC | PRN

## 2024-06-01 NOTE — Progress Notes (Signed)
 Advanced [facility] Clinic Note  Date:  06/02/2024   ID:  [name], DOB 1961-06-20, MRN [id number]  Location: Home  Provider location: [facility] Type of Visit: Established patient  PCP:  Sherre Clapper, MD  HF Cardiologist: Dr. Cherrie   HPI: Allison White is a 63 y.o. female with morbid obesity, HTN, carotid artery dissection with stenting of left carotid due to possible FMD, CAD s/p anterior MI 1/18 (DES to LAD), systolic HF with EF 20-25% s/p Biotronik ICD.   Seen in 5/21 with worsening NYHA IIIb symptoms. Had R/L cath with normal coronary arteries; widely patent LAD stent, EF 20-25%, Normal hemodynamics with CI 3.4. -> CPX  = pVO2: 15.3 (90% predicted pVO2) - adjusted to iBW 25.45ml/kg/min VE/VCO2 slope:  33   Seen in HF clinic 01/2021 with orthostasis. Reds 30%. Recommended ted hose. Sleep study deferred at patient/husband request.   Had lumpectomy 9/22 for abnormal mammogram. Finished 6 weeks of XRT in 12/22. No chemo.   Echo 11/22 EF 25-30% G1DD  RV ok   Echo 01/09/23: EF 20-25% RV ok G2 DD mod MR  Underwent Barostim on 11/14/23.  Admitted 6/25 with acute cholecystitis. Underwent lap chole, stable from HF standpoint.  Today she returns for HF follow up. Overall feeling fine. Still having issues with stomach pain. Not drinking as much so has cut back on torsemide , sometimes skips days at a time. Feet and ankles are a bit swollen. Feels palpitations off and on since barostim. She has chronic vertigo. Denies abnormal bleeding, CP, syncope or PND/Orthopnea. Appetite fair. Weight at home 200 pounds. Taking all medications.   Cardiac Studies  Echo 2/24: EF 20-25%, RV ok, G2 DD, mod MR  Echo 11/22: EF 25-30%, G1DD  RV ok   Echo 12/21: EF 25%   Echo 8/20: EF 20-25%, RV ok.  Echo 4/19: EF 25-30%, Trivial MR, Normal RV, Mild TR, PA peak pressure 26 mmHg  Echo 8/18: EF 20-25%  - CPX 8/21 FVC 3.08 (91%)      FEV1 2.30 (87%%)         FEV1/FVC 75 (94%)        MVV 89 (94%)  RER 1.10 Peak VO2: 15.3 (90% predicted peak VO2) - adjusted to iBW 25.83ml/kg/min VE/VCO2 slope:  33   - R/LHC 04/07/20 Ao = 102/56 (75) LV = 104/13 RA = 3 RV = 27/4 PA = 28/9 (17) PCW = 7 Fick cardiac output/index = 7.2/3.4 PVR = 1.4 WU FA sat = 99% PA sat = 79%, 81% High SVC sat = 81%  - Monitor 10/19 1. Sinus rhythm 2. Rare PVCs and bigeminy (< 1.0%) 3. Two patient-triggered events associated with isolated PVCs.  4. No high-grade arrhythmias    - CPX 03/17/18 FVC 3.00 (87%), FEV1 2.97(88%), FEV1/FVC 79 (99%)  Peak VO2: 13.8 - When adjusted to the patient's ideal body weight of 142.2 lb (64.5 kg) the peak VO2 is 22.6 ml/kg VE/VCO2 slope: 38 OUES: 1.52 Peak RER: 1.11 Ventilatory Threshold: 11.8 VE/MVV:  95% O2pulse:  8 Interpretation: Mild limitation due to HF and obesity   Past Medical History:  Diagnosis Date   AICD (automatic cardioverter/defibrillator) present 02/25/2017   Biotronik- Dual PPM/AICD   Arthritis    Cancer (HCC) 2022   Rt Breast   CHF (congestive heart failure) (HCC)    Coronary artery disease    Diabetes mellitus without complication (HCC)    Pt denies having diabetes   Fibromuscular dysplasia (  HCC)    FMD (facioscapulohumeral muscular dystrophy) (HCC)    Headache(784.0)    Heart failure (HCC)    Hyperlipidemia    MI (myocardial infarction) (HCC) 11/19/2016   s/p DES LAD   Migraine    Nonruptured cerebral aneurysm, internal carotid artery 07/14/2008   Left ICA stent placed for pseudoaneurysm, likely from dissection related to fibromuscular dysplasia009)   Palpitations    PONV (postoperative nausea and vomiting)    Pseudoaneurysm (HCC)    both carotids    Stroke (HCC) 06/10/2008   when carotid artery dissected, she was told she had a stroke (pt states she has had dissection on both sides); s/p left ICA stent 07/14/08   Vertigo    Past Surgical History:  Procedure Laterality Date   ABDOMINAL  HYSTERECTOMY  08/16/2004   Uterus and Cervix (per pathology)   BAROREFLEX SYSTEM INSERTION  11/14/2023   BREAST LUMPECTOMY Right 07/2021   CARDIAC DEFIBRILLATOR PLACEMENT     CEREBRAL ANEURYSM REPAIR Left 2009   CESAREAN SECTION     CHOLECYSTECTOMY N/A 05/15/2024   Procedure: LAPAROSCOPIC CHOLECYSTECTOMY WITH INTRAOPERATIVE CHOLANGIOGRAM;  Surgeon: Lyndel Deward PARAS, MD;  Location: [facility] OR;  Service: General;  Laterality: N/A;   CORONARY ANGIOPLASTY WITH STENT PLACEMENT     RIGHT/LEFT HEART CATH AND CORONARY ANGIOGRAPHY N/A 04/07/2020   Procedure: RIGHT/LEFT HEART CATH AND CORONARY ANGIOGRAPHY;  Surgeon: Cherrie Toribio SAUNDERS, MD;  Location: [facility] INVASIVE CV LAB;  Service: Cardiovascular;  Laterality: N/A;   TUBAL LIGATION     Current Outpatient Medications  Medication Sig Dispense Refill   acetaminophen  (TYLENOL ) 500 MG tablet Take 2 tablets (1,000 mg total) by mouth every 6 (six) hours. Can take 2 tabs (1000 mg total) every 6 hours scheduled for 3 to 5 days and after that change to 2 tabs (1000 mg total) every 6 hours as needed for mild pain, fever or headache.     aspirin  81 MG EC tablet Take 81 mg by mouth in the morning.     bismuth subsalicylate (PEPTO BISMOL) 262 MG chewable tablet Chew 524 mg by mouth as needed for indigestion.     dapagliflozin  propanediol (FARXIGA ) 10 MG TABS tablet Take 1 tablet (10 mg total) by mouth daily. 90 tablet 3   digoxin  (LANOXIN ) 0.125 MG tablet Take 1 tablet by mouth once daily 90 tablet 3   Evolocumab  (REPATHA  SURECLICK) 140 MG/ML SOAJ INJECT 140MG  INTO THE SKIN EVERY 14 DAYS 2 mL 2   fenofibrate  160 MG tablet Take 1 tablet (160 mg total) by mouth daily. 90 tablet 1   ibuprofen (ADVIL) 200 MG tablet Take 200-400 mg by mouth every 6 (six) hours as needed for moderate pain (pain score 4-6).     icosapent  Ethyl (VASCEPA ) 1 g capsule Take 2 capsules by mouth twice daily 120 capsule 0   ivabradine  (CORLANOR ) 7.5 MG TABS tablet Take 1 tablet (7.5 mg total) by  mouth 2 (two) times daily with a meal. 180 tablet 3   loratadine  (CLARITIN ) 10 MG tablet Take 10 mg by mouth in the morning. [facility] brand     methocarbamol  (ROBAXIN ) 500 MG tablet Take 1 tablet (500 mg total) by mouth every 8 (eight) hours as needed for muscle spasms. 30 tablet 0   metoprolol  succinate (TOPROL -XL) 50 MG 24 hr tablet Take 1 tablet by mouth twice daily 180 tablet 3   nitroGLYCERIN  (NITROSTAT ) 0.4 MG SL tablet Place 0.4 mg under the tongue every 5 (five) minutes as needed for chest pain.  oxyCODONE  (OXY IR/ROXICODONE ) 5 MG immediate release tablet Take 1 tablet (5 mg total) by mouth every 4 (four) hours as needed. 15 tablet 0   Phenazopyridine HCl (AZO TABS PO) Take 1 tablet by mouth daily as needed (painful urination).     raloxifene  (EVISTA ) 60 MG tablet Take 1 tablet by mouth once daily 90 tablet 3   SIMETHICONE PO Take 80 mg by mouth every 6 (six) hours as needed for flatulence.     spironolactone  (ALDACTONE ) 25 MG tablet Take 1 tablet (25 mg total) by mouth daily. 90 tablet 0   torsemide  (DEMADEX ) 100 MG tablet Take 0.5 tablets (50 mg total) by mouth daily. 45 tablet 3   Vonoprazan Fumarate  (VOQUEZNA ) 20 MG TABS Take 20 mg by mouth daily. 30 tablet 3   No current facility-administered medications for this encounter.   Allergies:   Tape, Plavix [clopidogrel bisulfate], Praluent  [alirocumab ], Statins, and Tamoxifen    Social History:  The patient  reports that she quit smoking about 42 years ago. Her smoking use included cigarettes. She has a 6 pack-year smoking history. She has never used smokeless tobacco. She reports that she does not currently use alcohol. She reports that she does not use drugs.   Family History:  The patient's family history includes Brain cancer (age of onset: 76) in her maternal aunt; Breast cancer (age of onset: 4) in her maternal aunt; Cerebral aneurysm in her paternal grandmother; Dementia in her paternal grandfather; Gastric cancer (age of onset:  14) in her mother; Hypertension in her brother; Prostate cancer in her father, maternal grandfather, paternal grandfather, and paternal uncle; Thyroid  disease in her mother.   ROS:  Please see the history of present illness.   All other systems are personally reviewed and negative.   Wt Readings from Last 3 Encounters:  06/02/24 92.1 kg (203 lb)  06/01/24 90.7 kg (200 lb)  05/24/24 92.4 kg (203 lb 12.8 oz)   BP 122/80   Pulse 65   Ht 5' 5 (1.651 m)   Wt 92.1 kg (203 lb)   SpO2 95%   BMI 33.78 kg/m   Physical Exam:   General:  NAD. No resp difficulty, walked into clinic HEENT: Normal Neck: Supple. No JVD. Thick neck Cor: Regular rate & rhythm. No rubs, gallops, 2/6 SEM RUSB Lungs: Clear Abdomen: Soft, obese, nontender, nondistended.  Extremities: No cyanosis, clubbing, rash, trace pedal edema Neuro: Alert & oriented x 3, moves all 4 extremities w/o difficulty. Affect pleasant.  ReDs reading: 23%, abnormal  Assessment & Plan: 1. Chronic systolic HF, ICM - Echo 02/2018 LVEF 25-30%, Trivial MR, Normal RV, Mild TR, PA peak pressure 26 mm Hg - s/p Biotronik ICD (Followed by Dr. Shelton) - Echo 07/13/19 EF 20-25% - Cath 5/21 EF 20-25% normal coronaries. Well compensated hemodynamics with CI 3.4 - Echo 12/21 EF 25% - CPX 8/21 very reassuring - Peak VO2: 15.3 (90% predicted peak VO2) - adjusted to iBW 25.54ml/kg/min. VEVCO2 33  - Echo 11/22 EF 25-30% G1DD  RV ok  - Echo 01/09/23: EF 20-25% RV ok G2 DD mod MR   - Underwent Barostim implant 11/14/23   - Slightly improved with Barostim NYHA II-III. Volume a bit low in setting of reduced po intake, ReDs 23%.  - On excellent GDMT - Decrease torsemide  to 40 mg every other day. - Cotninue losartan  25 at bedtime (off Entresto  with low BP) - Continue spironolactone  25 mg daily.  - Continue Toprol  XL 50 mg bid - Continue digoxin   0.125 mg. Had dig today, check dig trough at next visit - Continue Farxiga  10 mg daily.  - Continue ivabradine  7.5  mg bid - Continue to titrate Barostim as tolerated.  - Repeat echo arranged. - Labs today.  2. CAD  - s/p anterior MI 1/18 with DES to LAD in [place] - Cath 5/21 normal cors, with patent LAD stent - No chest pain - Failed statin.  - Continue Repatha , fenofibrate  & Vascepa  - Most recent LDL 57 (6/25), Goal LDL < 70.   3. Obesity - Body mass index is 33.78 kg/m. - Consider GLP1RA, but hold off with recent acut chole.   4. H/o carotid pseudoaneursym - Left ICA pseudoaneurysm at the cervical petrous portion s/p stent placement (05/2008, 10/2009), chronic HAs and vertigo - has residual vertigo and dizziness.  - No changes   5. Palpitations - Intermittent. - Zio patch 10/19 was ok  - Having some palpitations with Barostim  6. Breast Cancer - s/p lumpectomy 9/22 followed by XRT. Completed 12/22 - No plans for chemo  7. Polycythemia - followed by hematology. BMBx was normal - Home sleep study 1/22 AHI 4.6  - Last hgb 15.2, at PCP on 05/24/24  Keep follow up in 2 months with Dr. Cherrie + echo, as scheduled.   Bonney Harlene CHRISTELLA Glena, FNP  06/02/2024 9:35 AM  [facility] [address] Heart and Vascular [place] [address] [phone] (office) [phone] (fax)

## 2024-06-02 ENCOUNTER — Ambulatory Visit (HOSPITAL_COMMUNITY): Payer: Self-pay | Admitting: Family Medicine

## 2024-06-02 ENCOUNTER — Ambulatory Visit (HOSPITAL_COMMUNITY)
Admission: RE | Admit: 2024-06-02 | Discharge: 2024-06-02 | Disposition: A | Discharge status: Home / Self Care | Source: Ambulatory Visit | Attending: Family Medicine | Admitting: Family Medicine

## 2024-06-02 ENCOUNTER — Encounter: Payer: Self-pay | Admitting: Family Medicine

## 2024-06-02 ENCOUNTER — Encounter (HOSPITAL_COMMUNITY): Payer: Self-pay

## 2024-06-02 VITALS — BP 122/80 | HR 65 | Ht 65.0 in | Wt 203.0 lb

## 2024-06-02 DIAGNOSIS — R109 Unspecified abdominal pain: Secondary | ICD-10-CM | POA: Diagnosis not present

## 2024-06-02 DIAGNOSIS — Z803 Family history of malignant neoplasm of breast: Secondary | ICD-10-CM | POA: Diagnosis not present

## 2024-06-02 DIAGNOSIS — Z8249 Family history of ischemic heart disease and other diseases of the circulatory system: Secondary | ICD-10-CM | POA: Diagnosis not present

## 2024-06-02 DIAGNOSIS — D751 Secondary polycythemia: Secondary | ICD-10-CM | POA: Diagnosis not present

## 2024-06-02 DIAGNOSIS — Z87891 Personal history of nicotine dependence: Secondary | ICD-10-CM | POA: Insufficient documentation

## 2024-06-02 DIAGNOSIS — I255 Ischemic cardiomyopathy: Secondary | ICD-10-CM | POA: Diagnosis not present

## 2024-06-02 DIAGNOSIS — R002 Palpitations: Secondary | ICD-10-CM

## 2024-06-02 DIAGNOSIS — Z8679 Personal history of other diseases of the circulatory system: Secondary | ICD-10-CM | POA: Insufficient documentation

## 2024-06-02 DIAGNOSIS — I720 Aneurysm of carotid artery: Secondary | ICD-10-CM

## 2024-06-02 DIAGNOSIS — Z923 Personal history of irradiation: Secondary | ICD-10-CM | POA: Diagnosis not present

## 2024-06-02 DIAGNOSIS — I110 Hypertensive heart disease with heart failure: Secondary | ICD-10-CM | POA: Diagnosis not present

## 2024-06-02 DIAGNOSIS — Z6833 Body mass index (BMI) 33.0-33.9, adult: Secondary | ICD-10-CM | POA: Diagnosis not present

## 2024-06-02 DIAGNOSIS — Z79899 Other long term (current) drug therapy: Secondary | ICD-10-CM | POA: Insufficient documentation

## 2024-06-02 DIAGNOSIS — I2510 Atherosclerotic heart disease of native coronary artery without angina pectoris: Secondary | ICD-10-CM

## 2024-06-02 DIAGNOSIS — Z955 Presence of coronary angioplasty implant and graft: Secondary | ICD-10-CM | POA: Diagnosis not present

## 2024-06-02 DIAGNOSIS — Z853 Personal history of malignant neoplasm of breast: Secondary | ICD-10-CM | POA: Diagnosis not present

## 2024-06-02 DIAGNOSIS — I252 Old myocardial infarction: Secondary | ICD-10-CM | POA: Insufficient documentation

## 2024-06-02 DIAGNOSIS — I5022 Chronic systolic (congestive) heart failure: Secondary | ICD-10-CM

## 2024-06-02 LAB — COMPREHENSIVE METABOLIC PANEL WITH GFR
ALT: 62 IU/L — ABNORMAL HIGH (ref 0–32)
AST: 27 IU/L (ref 0–40)
Albumin: 4.5 g/dL (ref 3.9–4.9)
Alkaline Phosphatase: 145 IU/L — ABNORMAL HIGH (ref 44–121)
BUN/Creatinine Ratio: 17 (ref 12–28)
BUN: 21 mg/dL (ref 8–27)
Bilirubin Total: 0.4 mg/dL (ref 0.0–1.2)
CO2: 20 mmol/L (ref 20–29)
Calcium: 10.2 mg/dL (ref 8.7–10.3)
Chloride: 103 mmol/L (ref 96–106)
Creatinine, Ser: 1.22 mg/dL — ABNORMAL HIGH (ref 0.57–1.00)
Globulin, Total: 2.3 g/dL (ref 1.5–4.5)
Glucose: 104 mg/dL — ABNORMAL HIGH (ref 70–99)
Potassium: 4.4 mmol/L (ref 3.5–5.2)
Sodium: 140 mmol/L (ref 134–144)
Total Protein: 6.8 g/dL (ref 6.0–8.5)
eGFR: 50 mL/min/1.73 — ABNORMAL LOW (ref >59)

## 2024-06-02 LAB — DIGOXIN LEVEL: Digoxin Level: 0.7 ng/mL — ABNORMAL LOW (ref 0.8–2.0)

## 2024-06-02 LAB — BASIC METABOLIC PANEL WITH GFR
Anion gap: 10 (ref 5–15)
BUN: 16 mg/dL (ref 8–23)
CO2: 25 mmol/L (ref 22–32)
Calcium: 9.6 mg/dL (ref 8.9–10.3)
Chloride: 103 mmol/L (ref 98–111)
Creatinine, Ser: 1.06 mg/dL — ABNORMAL HIGH (ref 0.44–1.00)
GFR, Estimated: 59 mL/min — ABNORMAL LOW (ref >60)
Glucose, Bld: 125 mg/dL — ABNORMAL HIGH (ref 70–99)
Potassium: 4.2 mmol/L (ref 3.5–5.1)
Sodium: 138 mmol/L (ref 135–145)

## 2024-06-02 LAB — BRAIN NATRIURETIC PEPTIDE: B Natriuretic Peptide: 240 pg/mL — ABNORMAL HIGH (ref 0.0–100.0)

## 2024-06-02 MED ORDER — TORSEMIDE 20 MG PO TABS: 40.0000 mg | ORAL_TABLET | ORAL | 6 refills | Status: DC

## 2024-06-02 NOTE — Patient Instructions (Addendum)
 Thank you for coming in today  If you had labs drawn today, any labs that are abnormal the clinic will call you No news is good news  Medications: Decrease Torsemide  to 40 mg 2 tablets every other day  Follow up appointments:  Your physician recommends that you schedule a follow-up appointment in:  Keep follow up with Dr. Cherrie with echocardiogram  Your physician has requested that you have an echocardiogram. Echocardiography is a painless test that uses sound waves to create images of your heart. It provides your doctor with information about the size and shape of your heart and how well your heart's chambers and valves are working. This procedure takes approximately one hour. There are no restrictions for this procedure.      Do the following things EVERYDAY: Weigh yourself in the morning before breakfast. Write it down and keep it in a log. Take your medicines as prescribed Eat low salt foods--Limit salt (sodium) to 2000 mg per day.  Stay as active as you can everyday Limit all fluids for the day to less than 2 liters   At the [facility], you and your health needs are our priority. As part of our continuing mission to provide you with exceptional heart care, we have created designated Provider Care Teams. These Care Teams include your primary Cardiologist (physician) and Advanced Practice Providers (APPs- Physician Assistants and Nurse Practitioners) who all work together to provide you with the care you need, when you need it.   You may see any of the following providers on your designated Care Team at your next follow up: Dr Toribio Cherrie Dr Ezra Shuck Dr. Ria Gardenia Greig Lenetta, NP Caffie Shed, [place] [facility] [place], [place] Beckey Coe, NP Tinnie Redman, PharmD   Please be sure to bring in all your medications bottles to every appointment.    Thank you for choosing [facility]  If  you have any questions or concerns before your next appointment please send us  a message through [place] or call our office at [phone].    TO LEAVE A MESSAGE FOR THE NURSE SELECT OPTION 2, PLEASE LEAVE A MESSAGE INCLUDING: YOUR NAME DATE OF BIRTH CALL BACK NUMBER REASON FOR CALL**this is important as we prioritize the call backs  YOU WILL RECEIVE A CALL BACK THE SAME DAY AS LONG AS YOU CALL BEFORE 4:00 PM

## 2024-06-03 DIAGNOSIS — R1013 Epigastric pain: Secondary | ICD-10-CM | POA: Diagnosis not present

## 2024-06-04 ENCOUNTER — Other Ambulatory Visit: Payer: Self-pay | Admitting: Student

## 2024-06-04 DIAGNOSIS — R1013 Epigastric pain: Secondary | ICD-10-CM

## 2024-06-04 LAB — CBC WITH DIFFERENTIAL/PLATELET
Basophils Absolute: 0 x10E3/uL (ref 0.0–0.2)
Basos: 1 %
EOS (ABSOLUTE): 0.2 x10E3/uL (ref 0.0–0.4)
Eos: 3 %
Hematocrit: 46.7 % — ABNORMAL HIGH (ref 34.0–46.6)
Hemoglobin: 15.9 g/dL (ref 11.1–15.9)
Immature Grans (Abs): 0 x10E3/uL (ref 0.0–0.1)
Immature Granulocytes: 0 %
Lymphocytes Absolute: 2 x10E3/uL (ref 0.7–3.1)
Lymphs: 29 %
MCH: 32.5 pg (ref 26.6–33.0)
MCHC: 34 g/dL (ref 31.5–35.7)
MCV: 96 fL (ref 79–97)
Monocytes Absolute: 0.6 x10E3/uL (ref 0.1–0.9)
Monocytes: 9 %
Neutrophils Absolute: 4.1 x10E3/uL (ref 1.4–7.0)
Neutrophils: 58 %
Platelets: 309 x10E3/uL (ref 150–450)
RBC: 4.89 x10E6/uL (ref 3.77–5.28)
RDW: 12.5 % (ref 11.7–15.4)
WBC: 7 x10E3/uL (ref 3.4–10.8)

## 2024-06-04 LAB — LACTATE DEHYDROGENASE, ISOENZYMES
(LD) Fraction 1: 20 % (ref 17–32)
(LD) Fraction 2: 33 % (ref 25–40)
(LD) Fraction 3: 21 % (ref 17–27)
(LD) Fraction 4: 11 % (ref 5–13)
(LD) Fraction 5: 15 % (ref 4–20)
LDH: 189 IU/L (ref 119–226)

## 2024-06-04 LAB — LIPASE: Lipase: 52 U/L (ref 14–72)

## 2024-06-05 DIAGNOSIS — R1013 Epigastric pain: Secondary | ICD-10-CM | POA: Insufficient documentation

## 2024-06-05 DIAGNOSIS — G8929 Other chronic pain: Secondary | ICD-10-CM | POA: Insufficient documentation

## 2024-06-05 NOTE — Assessment & Plan Note (Signed)
 Mechanical lower back pain due to poor sleeping posture. - Continue ibuprofen and acetaminophen  as needed.

## 2024-06-05 NOTE — Assessment & Plan Note (Signed)
 Persistent post-surgical abdominal pain with possible unresolved gallstones or post-surgical complications. Elevated liver enzymes suggest liver involvement. - Repeat blood work to assess liver enzymes. - Consult with surgeon regarding unresolved gallstones and post-surgical complications. - Encouraged follow-up with surgeon.

## 2024-06-10 ENCOUNTER — Ambulatory Visit: Payer: HMO

## 2024-06-10 DIAGNOSIS — I255 Ischemic cardiomyopathy: Secondary | ICD-10-CM

## 2024-06-14 DIAGNOSIS — D2371 Other benign neoplasm of skin of right lower limb, including hip: Secondary | ICD-10-CM | POA: Diagnosis not present

## 2024-06-14 DIAGNOSIS — L400 Psoriasis vulgaris: Secondary | ICD-10-CM | POA: Diagnosis not present

## 2024-06-14 DIAGNOSIS — L299 Pruritus, unspecified: Secondary | ICD-10-CM | POA: Diagnosis not present

## 2024-06-15 ENCOUNTER — Other Ambulatory Visit (HOSPITAL_BASED_OUTPATIENT_CLINIC_OR_DEPARTMENT_OTHER): Payer: Self-pay | Admitting: Family Medicine

## 2024-06-15 DIAGNOSIS — Z1231 Encounter for screening mammogram for malignant neoplasm of breast: Secondary | ICD-10-CM

## 2024-06-16 ENCOUNTER — Encounter: Payer: Self-pay | Admitting: Family Medicine

## 2024-06-16 ENCOUNTER — Ambulatory Visit
Admission: RE | Admit: 2024-06-16 | Discharge: 2024-06-16 | Disposition: A | Discharge status: Home / Self Care | Source: Ambulatory Visit | Attending: Student

## 2024-06-16 ENCOUNTER — Other Ambulatory Visit: Payer: Self-pay | Admitting: Internal Medicine

## 2024-06-16 DIAGNOSIS — R1013 Epigastric pain: Secondary | ICD-10-CM

## 2024-06-21 ENCOUNTER — Ambulatory Visit (HOSPITAL_BASED_OUTPATIENT_CLINIC_OR_DEPARTMENT_OTHER): Admitting: Radiology

## 2024-06-22 ENCOUNTER — Other Ambulatory Visit (HOSPITAL_COMMUNITY): Payer: Self-pay | Admitting: Student

## 2024-06-22 ENCOUNTER — Encounter: Payer: Self-pay | Admitting: Family Medicine

## 2024-06-22 DIAGNOSIS — R1013 Epigastric pain: Secondary | ICD-10-CM

## 2024-06-24 ENCOUNTER — Other Ambulatory Visit: Payer: Self-pay | Admitting: Family Medicine

## 2024-06-24 MED ORDER — HYDROCODONE-ACETAMINOPHEN 7.5-325 MG PO TABS: 1.0000 | ORAL_TABLET | Freq: Four times a day (QID) | ORAL | 0 refills | Status: DC | PRN

## 2024-06-27 ENCOUNTER — Telehealth: Payer: Self-pay | Admitting: Gastroenterology

## 2024-06-27 NOTE — Telephone Encounter (Signed)
 Case discussed with surgery team. Reviewed, and although ideally would have MRI/MRCP, she cannot as a result of implantations in her body. She is having discomfort and imaging that suggest biliary sludge or debris. Next step is for EUS and possible ERCP for this patient.  If patient's pain/discomfort progresses significantly in the interim, or her laboratories show progressive worsening or obstruction or she starts developing fevers and jaundice with dark in urine, she will likely need to come to the hospital for that.  Will have my team reach out to patient. Recommend updated laboratories including CMP/amylase/lipase to be drawn in 2 weeks.  Will move forward with trying to get her scheduled for September 18 for EUS +/- ERCP   Aloha Finner, MD [facility] Advanced Endoscopy Office # [phone]

## 2024-06-29 ENCOUNTER — Other Ambulatory Visit: Payer: Self-pay

## 2024-06-29 DIAGNOSIS — K839 Disease of biliary tract, unspecified: Secondary | ICD-10-CM

## 2024-06-29 DIAGNOSIS — R1013 Epigastric pain: Secondary | ICD-10-CM | POA: Diagnosis not present

## 2024-06-29 NOTE — Telephone Encounter (Signed)
EUSERCP scheduled, pt instructed and medications reviewed.  Patient instructions mailed to home.  Patient to call with any questions or concerns.  

## 2024-06-29 NOTE — Telephone Encounter (Signed)
 EUS ERCP has been entered for 08/05/24 at [facility] with GM at 1245 pm

## 2024-06-29 NOTE — Telephone Encounter (Signed)
 Lab order has been entered

## 2024-07-12 ENCOUNTER — Other Ambulatory Visit: Payer: Self-pay | Admitting: Family Medicine

## 2024-07-12 ENCOUNTER — Encounter (HOSPITAL_BASED_OUTPATIENT_CLINIC_OR_DEPARTMENT_OTHER): Payer: Self-pay

## 2024-07-12 DIAGNOSIS — I25119 Atherosclerotic heart disease of native coronary artery with unspecified angina pectoris: Secondary | ICD-10-CM

## 2024-07-13 ENCOUNTER — Other Ambulatory Visit: Payer: Self-pay | Admitting: Family Medicine

## 2024-07-21 ENCOUNTER — Other Ambulatory Visit: Payer: Self-pay

## 2024-07-22 ENCOUNTER — Other Ambulatory Visit: Payer: Self-pay

## 2024-07-22 ENCOUNTER — Ambulatory Visit: Payer: Self-pay | Admitting: Gastroenterology

## 2024-07-22 ENCOUNTER — Other Ambulatory Visit

## 2024-07-22 DIAGNOSIS — K839 Disease of biliary tract, unspecified: Secondary | ICD-10-CM

## 2024-07-22 LAB — COMPREHENSIVE METABOLIC PANEL WITH GFR
ALT: 63 U/L — ABNORMAL HIGH (ref 0–35)
AST: 53 U/L — ABNORMAL HIGH (ref 0–37)
Albumin: 4.3 g/dL (ref 3.5–5.2)
Alkaline Phosphatase: 104 U/L (ref 39–117)
BUN: 17 mg/dL (ref 6–23)
CO2: 29 meq/L (ref 19–32)
Calcium: 9.8 mg/dL (ref 8.4–10.5)
Chloride: 105 meq/L (ref 96–112)
Creatinine, Ser: 0.99 mg/dL (ref 0.40–1.20)
GFR: 61.05 mL/min (ref >60.00)
Glucose, Bld: 115 mg/dL — ABNORMAL HIGH (ref 70–99)
Potassium: 4.4 meq/L (ref 3.5–5.1)
Sodium: 142 meq/L (ref 135–145)
Total Bilirubin: 0.6 mg/dL (ref 0.2–1.2)
Total Protein: 7.2 g/dL (ref 6.0–8.3)

## 2024-07-22 LAB — AMYLASE: Amylase: 36 U/L (ref 27–131)

## 2024-07-22 LAB — LIPASE: Lipase: 21 U/L (ref 11.0–59.0)

## 2024-07-28 ENCOUNTER — Telehealth: Payer: Self-pay | Admitting: Gastroenterology

## 2024-07-28 NOTE — Telephone Encounter (Addendum)
 Procedure:Upper EUS/ERCP Procedure date: 08/05/24 Procedure location: [facility] Arrival Time: 11:15 am Spoke with the patient Y/N: Yes Any prep concerns? No  Has the patient obtained the prep from the pharmacy ? No prep needed Do you have a care partner and transportation: Yes Any additional concerns? No

## 2024-07-29 ENCOUNTER — Other Ambulatory Visit: Payer: Self-pay

## 2024-07-29 ENCOUNTER — Encounter: Payer: Self-pay | Admitting: Family Medicine

## 2024-07-30 ENCOUNTER — Telehealth: Payer: Self-pay | Admitting: *Deleted

## 2024-07-30 ENCOUNTER — Telehealth (HOSPITAL_COMMUNITY): Payer: Self-pay | Admitting: Internal Medicine

## 2024-07-30 ENCOUNTER — Encounter (HOSPITAL_COMMUNITY): Payer: Self-pay | Admitting: Gastroenterology

## 2024-07-30 ENCOUNTER — Other Ambulatory Visit: Payer: Self-pay | Admitting: Family Medicine

## 2024-07-30 ENCOUNTER — Telehealth: Payer: Self-pay

## 2024-07-30 MED ORDER — HYDROCODONE-ACETAMINOPHEN 7.5-325 MG PO TABS: 1.0000 | ORAL_TABLET | Freq: Four times a day (QID) | ORAL | 0 refills | Status: DC | PRN

## 2024-07-30 NOTE — Telephone Encounter (Signed)
 Called to confirm/remind patient of their appointment at the [facility] Clinic on 07/30/24.   Appointment:   [x] Confirmed  [] Left mess   [] No answer/No voice mail  [] VM Full/unable to leave message  [] Phone not in service  Patient reminded to bring all medications and/or complete list.  Confirmed patient has transportation. Gave directions, instructed to utilize valet parking.

## 2024-07-30 NOTE — Progress Notes (Signed)
 Patient for endo procedure 9/18 and after anesthesia review, they have requested cardiac clearance based on significant heart history and pending cardiac studies. She has an echo on mon 9/15 and a f/u with Dr Cherrie after that. I spoke with Beth at [facility] office and she said she would add the clearance onto her appt on 9/15. Staff message sent to Dr Wilhelmenia and his nurse Patty.

## 2024-07-30 NOTE — Telephone Encounter (Addendum)
 Request for surgical clearance:     Endoscopy Procedure  What type of surgery is being performed?     Endoscopic Ultrasound  When is this surgery scheduled?     08/05/24  What type of clearance is required ?   Cardiac/Medical  Are there any medications that need to be held prior to surgery and how long? N/A  Practice name and name of physician performing surgery?      [facility]  What is your office phone and fax number?      Phone- [phone]  Fax- [phone]  Anesthesia type (None, local, MAC, general) ?       MAC  Please route your response to Odetta Curly, RN

## 2024-07-30 NOTE — Telephone Encounter (Signed)
 [facility] Pre-operative Risk Assessment     Request for surgical clearance:     Endoscopy Procedure  What type of surgery is being performed?     EUS  When is this surgery scheduled?     08/05/24  What type of clearance is required ?   Medical  Are there any medications that need to be held prior to surgery and how long? no  Practice name and name of physician performing surgery?      [facility] with Dr Wilhelmenia  What is your office phone and fax number?      Phone- [phone]  Fax- [phone]  Anesthesia type (None, local, MAC, general) ?       MAC   Please route your response to Odetta Curly, RN

## 2024-07-30 NOTE — Telephone Encounter (Signed)
 Pt has appt 08/02/24 with Toribio Fuel, MD ([facility]). Per Lamarr Satterfield preop clearance can be address at that time. Appt notes have been updated to include preop clearance  Will update the surgeons office

## 2024-07-30 NOTE — Telephone Encounter (Signed)
   Name: Allison White  DOB: 27-Feb-1961  MRN: [id number]  Primary Cardiologist: None   Preoperative team, please contact this patient and set up a phone call appointment ASAP for further preoperative risk assessment. Please obtain consent and complete medication review. Thank you for your help. Procedure is 08/05/2024.  I confirm that guidance regarding antiplatelet and oral anticoagulation therapy has been completed and, if necessary, noted below.  Per office protocol, if patient is without any new symptoms or concerns at the time of their virtual visit, she may hold aspirin  for 7 days prior to procedure. Please resume aspirin  as soon as possible postprocedure, at the discretion of the surgeon.    I also confirmed the patient resides in the state of [place] . As per [facility] Medical [employer] telemedicine laws, the patient must reside in the state in which the provider is licensed.   Lamarr Satterfield, NP 07/30/2024, 3:50 PM [facility]

## 2024-08-02 ENCOUNTER — Ambulatory Visit (HOSPITAL_BASED_OUTPATIENT_CLINIC_OR_DEPARTMENT_OTHER)
Admission: RE | Admit: 2024-08-02 | Discharge: 2024-08-02 | Disposition: A | Discharge status: Home / Self Care | Source: Ambulatory Visit | Attending: Internal Medicine | Admitting: Internal Medicine

## 2024-08-02 ENCOUNTER — Encounter (HOSPITAL_COMMUNITY): Payer: Self-pay | Admitting: Internal Medicine

## 2024-08-02 ENCOUNTER — Ambulatory Visit (HOSPITAL_COMMUNITY)
Admission: RE | Admit: 2024-08-02 | Discharge: 2024-08-02 | Disposition: A | Discharge status: Home / Self Care | Source: Ambulatory Visit | Attending: Internal Medicine | Admitting: Internal Medicine

## 2024-08-02 VITALS — BP 102/60 | HR 66 | Wt 197.8 lb

## 2024-08-02 DIAGNOSIS — I5022 Chronic systolic (congestive) heart failure: Secondary | ICD-10-CM | POA: Insufficient documentation

## 2024-08-02 DIAGNOSIS — E119 Type 2 diabetes mellitus without complications: Secondary | ICD-10-CM | POA: Diagnosis not present

## 2024-08-02 DIAGNOSIS — Z79899 Other long term (current) drug therapy: Secondary | ICD-10-CM | POA: Diagnosis not present

## 2024-08-02 DIAGNOSIS — Z7984 Long term (current) use of oral hypoglycemic drugs: Secondary | ICD-10-CM | POA: Insufficient documentation

## 2024-08-02 DIAGNOSIS — D751 Secondary polycythemia: Secondary | ICD-10-CM | POA: Diagnosis not present

## 2024-08-02 DIAGNOSIS — Z95810 Presence of automatic (implantable) cardiac defibrillator: Secondary | ICD-10-CM | POA: Diagnosis not present

## 2024-08-02 DIAGNOSIS — I2510 Atherosclerotic heart disease of native coronary artery without angina pectoris: Secondary | ICD-10-CM

## 2024-08-02 DIAGNOSIS — I255 Ischemic cardiomyopathy: Secondary | ICD-10-CM | POA: Diagnosis not present

## 2024-08-02 DIAGNOSIS — R002 Palpitations: Secondary | ICD-10-CM | POA: Diagnosis not present

## 2024-08-02 DIAGNOSIS — Z7982 Long term (current) use of aspirin: Secondary | ICD-10-CM | POA: Diagnosis not present

## 2024-08-02 DIAGNOSIS — I110 Hypertensive heart disease with heart failure: Secondary | ICD-10-CM | POA: Diagnosis not present

## 2024-08-02 DIAGNOSIS — Z955 Presence of coronary angioplasty implant and graft: Secondary | ICD-10-CM | POA: Insufficient documentation

## 2024-08-02 DIAGNOSIS — I252 Old myocardial infarction: Secondary | ICD-10-CM | POA: Insufficient documentation

## 2024-08-02 DIAGNOSIS — I444 Left anterior fascicular block: Secondary | ICD-10-CM | POA: Insufficient documentation

## 2024-08-02 DIAGNOSIS — Z923 Personal history of irradiation: Secondary | ICD-10-CM | POA: Diagnosis not present

## 2024-08-02 DIAGNOSIS — R109 Unspecified abdominal pain: Secondary | ICD-10-CM | POA: Insufficient documentation

## 2024-08-02 DIAGNOSIS — I340 Nonrheumatic mitral (valve) insufficiency: Secondary | ICD-10-CM | POA: Insufficient documentation

## 2024-08-02 DIAGNOSIS — Z01810 Encounter for preprocedural cardiovascular examination: Secondary | ICD-10-CM | POA: Diagnosis not present

## 2024-08-02 DIAGNOSIS — Z853 Personal history of malignant neoplasm of breast: Secondary | ICD-10-CM | POA: Insufficient documentation

## 2024-08-02 DIAGNOSIS — E785 Hyperlipidemia, unspecified: Secondary | ICD-10-CM | POA: Diagnosis not present

## 2024-08-02 DIAGNOSIS — Z95828 Presence of other vascular implants and grafts: Secondary | ICD-10-CM | POA: Diagnosis not present

## 2024-08-02 DIAGNOSIS — Z6832 Body mass index (BMI) 32.0-32.9, adult: Secondary | ICD-10-CM | POA: Diagnosis not present

## 2024-08-02 DIAGNOSIS — R197 Diarrhea, unspecified: Secondary | ICD-10-CM | POA: Diagnosis not present

## 2024-08-02 LAB — ECHOCARDIOGRAM COMPLETE
Area-P 1/2: 5.54 cm2
S' Lateral: 4.93 cm

## 2024-08-02 MED ORDER — PERFLUTREN LIPID MICROSPHERE
1.0000 mL | INTRAVENOUS | Status: DC | PRN
  Administered 2024-08-02: 4 mL via INTRAVENOUS

## 2024-08-02 NOTE — Progress Notes (Signed)
  Echocardiogram 2D Echocardiogram has been performed.  Koleen KANDICE Popper, RDCS 08/02/2024, 2:47 PM

## 2024-08-02 NOTE — Patient Instructions (Signed)
 Medication Changes:  None, continue current medications   Special Instructions // Education:  Do the following things EVERYDAY: Weigh yourself in the morning before breakfast. Write it down and keep it in a log. Take your medicines as prescribed Eat low salt foods--Limit salt (sodium) to 2000 mg per day.  Stay as active as you can everyday Limit all fluids for the day to less than 2 liters   Follow-Up in: 6-8 weeks   At the [facility] Clinic, you and your health needs are our priority. We have a designated team specialized in the treatment of Heart Failure. This Care Team includes your primary Heart Failure Specialized Cardiologist (physician), Advanced Practice Providers (APPs- Physician Assistants and Nurse Practitioners), and Pharmacist who all work together to provide you with the care you need, when you need it.   You may see any of the following providers on your designated Care Team at your next follow up:  Dr. Toribio Fuel Dr. Ezra Shuck Dr. Ria Commander Dr. Odis Brownie Greig Mosses, NP Caffie Shed, [place] [facility] [place], [place] Beckey Coe, NP [place] Lee, NP Tinnie Redman, PharmD   Please be sure to bring in all your medications bottles to every appointment.   Need to Contact Us :  If you have any questions or concerns before your next appointment please send us  a message through [place] or call our office at [phone].    TO LEAVE A MESSAGE FOR THE NURSE SELECT OPTION 2, PLEASE LEAVE A MESSAGE INCLUDING: YOUR NAME DATE OF BIRTH CALL BACK NUMBER REASON FOR CALL**this is important as we prioritize the call backs  YOU WILL RECEIVE A CALL BACK THE SAME DAY AS LONG AS YOU CALL BEFORE 4:00 PM

## 2024-08-02 NOTE — Telephone Encounter (Signed)
 Please see request for procedure on 08/05/24

## 2024-08-02 NOTE — Progress Notes (Signed)
 [facility] Note  Date:  08/02/2024   ID:  Allison White, DOB May 26, 1961, MRN [id number]  Location: Home  Provider location: [facility] Type of Visit: Established patient  PCP:  Sherre Clapper, MD  HF Cardiologist: Dr. Cherrie   HPI: Allison White is a 63 y.o. female with morbid obesity, HTN, carotid artery dissection with stenting of left carotid due to possible FMD, CAD s/p anterior MI 1/18 (DES to LAD), systolic HF with EF 20-25% s/p Biotronik ICD.   Seen in 5/21 with worsening NYHA IIIb symptoms. Had R/L cath with normal coronary arteries; widely patent LAD stent, EF 20-25%, Normal hemodynamics with CI 3.4. -> CPX  = pVO2: 15.3 (90% predicted pVO2) - adjusted to iBW 25.62ml/kg/min VE/VCO2 slope:  33   Seen in HF clinic 01/2021 with orthostasis. Reds 30%. Recommended ted hose. Sleep study deferred at patient/husband request.   Had lumpectomy 9/22 for abnormal mammogram. Finished 6 weeks of XRT in 12/22. No chemo.   Echo 11/22 EF 25-30% G1DD  RV ok   Echo 01/09/23: EF 20-25% RV ok G2 DD mod MR  Underwent Barostim on 11/14/23.  Admitted 6/25 with acute cholecystitis. Underwent lap chole, stable from HF standpoint.  Today she returns for HF follow up. Overall feeling OK. Able to do ADLs and work in garden. Still having episodes of GI symptoms. Has severe heartburn then gets severe stomach cramps and then goes into back and shoulders. + nausea/diarrhea.  Feels completely from her previous MI. No edema, orthopnea and PND. Pending endoscopic u/s.   - Echo today 08/02/24 EF 20-25% LV dilated RV ok Personally reviewed  Cardiac Studies   - CPX 8/21 FVC 3.08 (91%)      FEV1 2.30 (87%%)        FEV1/FVC 75 (94%)        MVV 89 (94%)  RER 1.10 Peak VO2: 15.3 (90% predicted peak VO2) - adjusted to iBW 25.65ml/kg/min VE/VCO2 slope:  33   - R/LHC 04/07/20 Ao = 102/56 (75) LV = 104/13 RA = 3 RV = 27/4 PA = 28/9 (17) PCW = 7 Fick  cardiac output/index = 7.2/3.4 PVR = 1.4 WU FA sat = 99% PA sat = 79%, 81% High SVC sat = 81%  - Monitor 10/19 1. Sinus rhythm 2. Rare PVCs and bigeminy (< 1.0%) 3. Two patient-triggered events associated with isolated PVCs.  4. No high-grade arrhythmias    - CPX 03/17/18 FVC 3.00 (87%), FEV1 2.97(88%), FEV1/FVC 79 (99%)  Peak VO2: 13.8 - When adjusted to the patient's ideal body weight of 142.2 lb (64.5 kg) the peak VO2 is 22.6 ml/kg VE/VCO2 slope: 38 OUES: 1.52 Peak RER: 1.11 Ventilatory Threshold: 11.8 VE/MVV:  95% O2pulse:  8 Interpretation: Mild limitation due to HF and obesity   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   Left  ICA stent placed for pseudoaneurysm, likely from dissection related to fibromuscular dysplasia009)   Palpitations    PONV (postoperative nausea and vomiting)    Pseudoaneurysm (HCC)    both carotids    Stroke (HCC) 06/10/2008   when carotid artery dissected, she was told she had a stroke (pt states she has had dissection on both sides); s/p left ICA stent 07/14/08   Vertigo    Past Surgical History:  Procedure Laterality Date   ABDOMINAL HYSTERECTOMY  08/16/2004   Uterus and Cervix (per pathology)   BAROREFLEX SYSTEM INSERTION  11/14/2023   BREAST LUMPECTOMY Right 07/2021   CARDIAC DEFIBRILLATOR PLACEMENT     CEREBRAL ANEURYSM REPAIR Left 2009   CESAREAN SECTION      CHOLECYSTECTOMY N/A 05/15/2024   Procedure: LAPAROSCOPIC CHOLECYSTECTOMY WITH INTRAOPERATIVE CHOLANGIOGRAM;  Surgeon: Lyndel Deward PARAS, MD;  Location: [facility] OR;  Service: General;  Laterality: N/A;   CORONARY ANGIOPLASTY WITH STENT PLACEMENT     RIGHT/LEFT HEART CATH AND CORONARY ANGIOGRAPHY N/A 04/07/2020   Procedure: RIGHT/LEFT HEART CATH AND CORONARY ANGIOGRAPHY;  Surgeon: Cherrie Toribio SAUNDERS, MD;  Location: [facility] INVASIVE [facility];  Service: Cardiovascular;  Laterality: N/A;   TUBAL LIGATION     Current Outpatient Medications  Medication Sig Dispense Refill   aspirin  81 MG EC tablet Take 81 mg by mouth in the morning.     bismuth subsalicylate (PEPTO BISMOL) 262 MG chewable tablet Chew 524 mg by mouth as needed for indigestion.     dapagliflozin  propanediol (FARXIGA ) 10 MG TABS tablet Take 1 tablet (10 mg total) by mouth daily. 90 tablet 3   digoxin  (LANOXIN ) 0.125 MG tablet Take 1 tablet by mouth once daily 90 tablet 0   fenofibrate  160 MG tablet Take 1 tablet (160 mg total) by mouth daily. 90 tablet 1   HYDROcodone -acetaminophen  (NORCO) 7.5-325 MG tablet Take 1 tablet by mouth every 6 (six) hours as needed for moderate pain (pain score 4-6). 60 tablet 0   icosapent  Ethyl (VASCEPA ) 1 g capsule Take 2 capsules by mouth twice daily 120 capsule 0   ivabradine  (CORLANOR ) 7.5 MG TABS tablet Take 1 tablet (7.5 mg total) by mouth 2 (two) times daily with a meal. 180 tablet 3   loratadine  (CLARITIN ) 10 MG tablet Take 10 mg by mouth in the morning. [facility] brand     metoprolol  succinate (TOPROL -XL) 50 MG 24 hr tablet Take 1 tablet by mouth twice daily 180 tablet 3   nitroGLYCERIN  (NITROSTAT ) 0.4 MG SL tablet Place 0.4 mg under the tongue every 5 (five) minutes as needed for chest pain.     pantoprazole  (PROTONIX ) 40 MG tablet Take 40 mg by mouth daily.     Phenazopyridine HCl (AZO TABS PO) Take 1 tablet by mouth daily as needed (painful urination).     raloxifene  (EVISTA ) 60 MG tablet Take 1 tablet by  mouth once daily 90 tablet 3   REPATHA  SURECLICK 140 MG/ML SOAJ INJECT 140 MG INTO THE SKIN EVERY 14 DAYS 2 mL 0   SIMETHICONE PO Take 80 mg by mouth every 6 (six) hours as needed for flatulence.     spironolactone  (ALDACTONE ) 25 MG tablet Take 1 tablet (25 mg total) by mouth daily. 90 tablet 0   torsemide  (DEMADEX ) 20 MG tablet Take 20 mg by mouth as needed.     No current facility-administered medications for this encounter.   Facility-Administered Medications Ordered in Other Encounters  Medication Dose Route Frequency Provider Last Rate Last Admin  perflutren  lipid microspheres (DEFINITY ) IV suspension  1-10 mL Intravenous PRN Kavina Cantave, Toribio SAUNDERS, MD   4 mL at 08/02/24 1448   Allergies:   Tape, Plavix [clopidogrel bisulfate], Praluent  [alirocumab ], Statins, and Tamoxifen    Social History:  The patient  reports that she quit smoking about 42 years ago. Her smoking use included cigarettes. She has a 6 pack-year smoking history. She has never used smokeless tobacco. She reports that she does not currently use alcohol. She reports that she does not use drugs.   Family History:  The patient's family history includes Brain cancer (age of onset: 65) in her maternal aunt; Breast cancer (age of onset: 14) in her maternal aunt; Cerebral aneurysm in her paternal grandmother; Dementia in her paternal grandfather; Gastric cancer (age of onset: 31) in her mother; Hypertension in her brother; Prostate cancer in her father, maternal grandfather, paternal grandfather, and paternal uncle; Thyroid  disease in her mother.   ROS:  Please see the history of present illness.   All other systems are personally reviewed and negative.   Wt Readings from Last 3 Encounters:  08/02/24 89.7 kg (197 lb 12.8 oz)  06/02/24 92.1 kg (203 lb)  06/01/24 90.7 kg (200 lb)   BP 102/60   Pulse 66   Wt 89.7 kg (197 lb 12.8 oz)   SpO2 98%   BMI 32.92 kg/m   Physical Exam:   General:  Well appearing. No resp  difficulty HEENT: normal Neck: supple. no JVD. Carotids 2+ bilat; no bruits. No lymphadenopathy or thryomegaly appreciated. Cor: PMI nondisplaced. Regular rate & rhythm. No rubs, gallops or murmurs. Lungs: clear Abdomen: obese soft, nontender, nondistended. No hepatosplenomegaly. No bruits or masses. Good bowel sounds. Extremities: no cyanosis, clubbing, rash, edema Neuro: alert & orientedx3, cranial nerves grossly intact. moves all 4 extremities w/o difficulty. Affect pleasant  ReDs reading: n/a  ECG: A-paced 69.Barostim artifact. No ST-T wave abnormalities.    Assessment & Plan: 1. Chronic systolic HF, ICM - Echo 02/2018 LVEF 25-30%, Trivial MR, Normal RV, Mild TR, PA peak pressure 26 mm Hg - s/p Biotronik ICD (Followed by Dr. Shelton) - Echo 07/13/19 EF 20-25% - Cath 5/21 EF 20-25% normal coronaries with patent LAD stent. Well compensated hemodynamics with CI 3.4 - Echo 12/21 EF 25% - CPX 8/21 very reassuring - Peak VO2: 15.3 (90% predicted peak VO2) - adjusted to iBW 25.55ml/kg/min. VEVCO2 33  - Echo 11/22 EF 25-30% G1DD  RV ok  - Echo 01/09/23: EF 20-25% RV ok G2 DD mod MR   - Underwent Barostim implant 11/14/23   - Echo today 08/02/24 EF 20-25% LV dilated RV ok Personally reviewed - Stable NYHA II-III - Is on torsemide  40 mg every other day but not taking for 2 months  - Cotninue losartan  25 at bedtime (off Entresto  with low BP) - Continue spironolactone  25 mg daily.  - Continue Toprol  XL 50 mg bid - Continue digoxin  0.125 mg. Had dig today, check dig trough at next visit - Continue Farxiga  10 mg daily.  - Continue ivabradine  7.5 mg bid - Continue to titrate Barostim as tolerated.  - Labs today.  2. Abdominal pain/diarrhea - pre-operative cardiac evaluation - not resolved after cholecystectomy - we discussed the possibility that some of these symptoms may  (moderate risk for CV complications)  - I will see back in 6-8 weeks. If symptoms not improved will need R/L cath  3.  CAD - s/p anterior MI 1/18 with DES to LAD in [place] - Cath  5/21 normal cors, with patent LAD stent - No clear s/s angina (see section above) - Failed statin.  - Continue Repatha , fenofibrate  & Vascepa  - Most recent LDL 57 (6/25), Goal LDL < 70.   4. Obesity - Body mass index is 32.92 kg/m. - PER PCP   5. H/o carotid pseudoaneursym - Left ICA pseudoaneurysm at the cervical petrous portion s/p stent placement (05/2008, 10/2009), chronic HAs and vertigo - has residual vertigo and dizziness.  - No changes   6. Palpitations - Intermittent. - Zio patch 10/19 was ok  - Having some palpitations with Barostim  7. Breast Cancer - s/p lumpectomy 9/22 followed by XRT. Completed 12/22 - No plans for chemo  8. Polycythemia - followed by hematology. BMBx was normal - Home sleep study 1/22 AHI 4.6  - Last hgb 15.2, at PCP on 05/24/24    Signed, Toribio Fuel, MD  08/02/2024 3:20 PM  [facility] [address] [facility] [place] [address] [phone] (office) [phone] (fax)

## 2024-08-02 NOTE — Telephone Encounter (Signed)
 Called patient to set up a televisit appt for a pre-op clearance. Patient is having Endoscopy on 08/05/24 and she takes ASA, patient mention her last dose of ASA was today. Patient mention she has an appt with Dr. Bensimhon for her clearance.

## 2024-08-02 NOTE — Telephone Encounter (Signed)
   Name: Allison White  DOB: 1961-04-02  MRN: [id number]  Primary Cardiologist: None   Preoperative team, please contact this patient and set up a phone call appointment for further preoperative risk assessment. Please obtain consent and complete medication review. Thank you for your help.  I confirm that guidance regarding antiplatelet and oral anticoagulation therapy has been completed and, if necessary, noted below.  I also confirmed the patient resides in the state of [place] . As per [facility] Medical Board telemedicine laws, the patient must reside in the state in which the provider is licensed.   Jon Nat Hails, PA 08/02/2024, 1:03 PM [facility]

## 2024-08-05 ENCOUNTER — Other Ambulatory Visit: Payer: Self-pay

## 2024-08-05 ENCOUNTER — Telehealth: Payer: Self-pay

## 2024-08-05 ENCOUNTER — Ambulatory Visit (HOSPITAL_COMMUNITY)
Admission: RE | Admit: 2024-08-05 | Discharge: 2024-08-05 | Disposition: A | Discharge status: Home / Self Care | Attending: Gastroenterology | Admitting: Gastroenterology

## 2024-08-05 ENCOUNTER — Ambulatory Visit (HOSPITAL_COMMUNITY): Admitting: Anesthesiology

## 2024-08-05 ENCOUNTER — Ambulatory Visit (HOSPITAL_COMMUNITY)

## 2024-08-05 ENCOUNTER — Encounter (HOSPITAL_COMMUNITY)
Admission: RE | Disposition: A | Discharge status: Home / Self Care | Payer: Self-pay | Source: Home / Self Care | Attending: Gastroenterology

## 2024-08-05 ENCOUNTER — Encounter (HOSPITAL_COMMUNITY): Payer: Self-pay | Admitting: Gastroenterology

## 2024-08-05 DIAGNOSIS — K2289 Other specified disease of esophagus: Secondary | ICD-10-CM

## 2024-08-05 DIAGNOSIS — I509 Heart failure, unspecified: Secondary | ICD-10-CM | POA: Diagnosis not present

## 2024-08-05 DIAGNOSIS — K2970 Gastritis, unspecified, without bleeding: Secondary | ICD-10-CM

## 2024-08-05 DIAGNOSIS — Z9049 Acquired absence of other specified parts of digestive tract: Secondary | ICD-10-CM

## 2024-08-05 DIAGNOSIS — K839 Disease of biliary tract, unspecified: Secondary | ICD-10-CM

## 2024-08-05 DIAGNOSIS — K3189 Other diseases of stomach and duodenum: Secondary | ICD-10-CM | POA: Diagnosis not present

## 2024-08-05 DIAGNOSIS — K219 Gastro-esophageal reflux disease without esophagitis: Secondary | ICD-10-CM | POA: Insufficient documentation

## 2024-08-05 DIAGNOSIS — I252 Old myocardial infarction: Secondary | ICD-10-CM | POA: Insufficient documentation

## 2024-08-05 DIAGNOSIS — I110 Hypertensive heart disease with heart failure: Secondary | ICD-10-CM | POA: Diagnosis not present

## 2024-08-05 DIAGNOSIS — K8050 Calculus of bile duct without cholangitis or cholecystitis without obstruction: Secondary | ICD-10-CM

## 2024-08-05 DIAGNOSIS — Z8673 Personal history of transient ischemic attack (TIA), and cerebral infarction without residual deficits: Secondary | ICD-10-CM | POA: Diagnosis not present

## 2024-08-05 DIAGNOSIS — K8040 Calculus of bile duct with cholecystitis, unspecified, without obstruction: Secondary | ICD-10-CM | POA: Insufficient documentation

## 2024-08-05 DIAGNOSIS — I10 Essential (primary) hypertension: Secondary | ICD-10-CM

## 2024-08-05 DIAGNOSIS — I2510 Atherosclerotic heart disease of native coronary artery without angina pectoris: Secondary | ICD-10-CM | POA: Diagnosis not present

## 2024-08-05 DIAGNOSIS — Z0189 Encounter for other specified special examinations: Secondary | ICD-10-CM | POA: Diagnosis not present

## 2024-08-05 DIAGNOSIS — E119 Type 2 diabetes mellitus without complications: Secondary | ICD-10-CM | POA: Diagnosis not present

## 2024-08-05 DIAGNOSIS — Z87891 Personal history of nicotine dependence: Secondary | ICD-10-CM | POA: Diagnosis not present

## 2024-08-05 DIAGNOSIS — R748 Abnormal levels of other serum enzymes: Secondary | ICD-10-CM | POA: Insufficient documentation

## 2024-08-05 DIAGNOSIS — K449 Diaphragmatic hernia without obstruction or gangrene: Secondary | ICD-10-CM | POA: Diagnosis not present

## 2024-08-05 DIAGNOSIS — R7989 Other specified abnormal findings of blood chemistry: Secondary | ICD-10-CM

## 2024-08-05 DIAGNOSIS — K838 Other specified diseases of biliary tract: Secondary | ICD-10-CM

## 2024-08-05 DIAGNOSIS — Z79899 Other long term (current) drug therapy: Secondary | ICD-10-CM | POA: Insufficient documentation

## 2024-08-05 HISTORY — PX: ESOPHAGOGASTRODUODENOSCOPY: SHX5428

## 2024-08-05 HISTORY — PX: EUS: SHX5427

## 2024-08-05 HISTORY — PX: ERCP: SHX5425

## 2024-08-05 SURGERY — ULTRASOUND, UPPER GI TRACT, ENDOSCOPIC
Anesthesia: Monitor Anesthesia Care

## 2024-08-05 MED ORDER — FENTANYL CITRATE (PF) 100 MCG/2ML IJ SOLN
INTRAMUSCULAR | Status: DC | PRN
  Administered 2024-08-05 (×2): 50 ug via INTRAVENOUS

## 2024-08-05 MED ORDER — SUGAMMADEX SODIUM 200 MG/2ML IV SOLN
INTRAVENOUS | Status: DC | PRN
Start: 2024-08-05 — End: 2024-08-05
  Administered 2024-08-05: 200 mg via INTRAVENOUS

## 2024-08-05 MED ORDER — LIDOCAINE HCL (CARDIAC) PF 100 MG/5ML IV SOSY
PREFILLED_SYRINGE | INTRAVENOUS | Status: DC | PRN
  Administered 2024-08-05: 50 mg via INTRAVENOUS

## 2024-08-05 MED ORDER — GLUCAGON HCL RDNA (DIAGNOSTIC) 1 MG IJ SOLR
INTRAMUSCULAR | Status: AC
  Filled 2024-08-05: qty 1

## 2024-08-05 MED ORDER — SODIUM CHLORIDE 0.9 % IV SOLN
INTRAVENOUS | Status: DC | PRN
  Administered 2024-08-05: 30 mL

## 2024-08-05 MED ORDER — ONDANSETRON HCL 4 MG/2ML IJ SOLN
INTRAMUSCULAR | Status: DC | PRN
  Administered 2024-08-05: 4 mg via INTRAVENOUS

## 2024-08-05 MED ORDER — DICLOFENAC SUPPOSITORY 100 MG
RECTAL | Status: DC | PRN
Start: 2024-08-05 — End: 2024-08-05
  Administered 2024-08-05: 100 mg via RECTAL

## 2024-08-05 MED ORDER — DEXAMETHASONE SODIUM PHOSPHATE 10 MG/ML IJ SOLN
INTRAMUSCULAR | Status: DC | PRN
Start: 2024-08-05 — End: 2024-08-05
  Administered 2024-08-05: 8 mg via INTRAVENOUS

## 2024-08-05 MED ORDER — SODIUM CHLORIDE 0.9 % IV SOLN: INTRAVENOUS | Status: DC

## 2024-08-05 MED ORDER — GLUCAGON HCL RDNA (DIAGNOSTIC) 1 MG IJ SOLR
INTRAMUSCULAR | Status: DC | PRN
Start: 2024-08-05 — End: 2024-08-05
  Administered 2024-08-05 (×2): .25 mg via INTRAVENOUS

## 2024-08-05 MED ORDER — ROCURONIUM BROMIDE 100 MG/10ML IV SOLN
INTRAVENOUS | Status: DC | PRN
  Administered 2024-08-05: 50 mg via INTRAVENOUS
  Administered 2024-08-05: 10 mg via INTRAVENOUS

## 2024-08-05 MED ORDER — FENTANYL CITRATE (PF) 100 MCG/2ML IJ SOLN
INTRAMUSCULAR | Status: AC
  Filled 2024-08-05: qty 2

## 2024-08-05 MED ORDER — CIPROFLOXACIN IN D5W 400 MG/200ML IV SOLN
INTRAVENOUS | Status: AC
  Filled 2024-08-05: qty 200

## 2024-08-05 MED ORDER — PROPOFOL 10 MG/ML IV BOLUS
INTRAVENOUS | Status: AC
Start: 2024-08-05 — End: 2024-08-05
  Filled 2024-08-05: qty 20

## 2024-08-05 MED ORDER — CIPROFLOXACIN IN D5W 400 MG/200ML IV SOLN
INTRAVENOUS | Status: DC | PRN
Start: 2024-08-05 — End: 2024-08-05
  Administered 2024-08-05: 400 mg via INTRAVENOUS

## 2024-08-05 MED ORDER — LACTATED RINGERS IV SOLN: INTRAVENOUS | Status: DC | PRN

## 2024-08-05 MED ORDER — PHENYLEPHRINE HCL (PRESSORS) 10 MG/ML IV SOLN
INTRAVENOUS | Status: DC | PRN
Start: 2024-08-05 — End: 2024-08-05
  Administered 2024-08-05 (×2): 100 ug via INTRAVENOUS

## 2024-08-05 MED ORDER — PROPOFOL 10 MG/ML IV BOLUS
INTRAVENOUS | Status: DC | PRN
  Administered 2024-08-05: 100 mg via INTRAVENOUS

## 2024-08-05 MED ORDER — DICLOFENAC SUPPOSITORY 100 MG
RECTAL | Status: AC
  Filled 2024-08-05: qty 1

## 2024-08-05 NOTE — Discharge Instructions (Signed)

## 2024-08-05 NOTE — Op Note (Signed)
 [facility] Patient Name: Allison White Procedure Date: 08/05/2024 MRN: [id number] Attending MD: Aloha Finner , MD, [id number] Date of Birth: 25-Feb-1961 CSN: [id number] Age: 63 Admit Type: Outpatient Procedure:                Upper EUS Indications:              Common bile duct dilation (acquired) seen on CT                            scan, Abnormal ultrasound of the abdomen, Suspected                            choledocholithiasis Providers:                Aloha Finner, MD, Robie Breed, RN,                            Lorrayne Kitty, Technician Referring MD:              Medicines:                General Anesthesia Complications:            No immediate complications. Estimated Blood Loss:     Estimated blood loss was minimal. Procedure:                Pre-Anesthesia Assessment:                           - Prior to the procedure, a History and Physical                            was performed, and patient medications and                            allergies were reviewed. The patient's tolerance of                            previous anesthesia was also reviewed. The risks                            and benefits of the procedure and the sedation                            options and risks were discussed with the patient.                            All questions were answered, and informed consent                            was obtained. Prior Anticoagulants: The patient has                            taken no anticoagulant or antiplatelet agents                            except for aspirin .  ASA Grade Assessment: III - A                            patient with severe systemic disease. After                            reviewing the risks and benefits, the patient was                            deemed in satisfactory condition to undergo the                            procedure.                           After obtaining informed consent, the endoscope  was                            passed under direct vision. Throughout the                            procedure, the patient's blood pressure, pulse, and                            oxygen saturations were monitored continuously. The                            [id number]-[id number] ([id number]) Olympus endoscope was introduced                            through the mouth, and advanced to the second part                            of duodenum. The [id number] ([id number]) Olympus                            ultrasound scope was introduced through the mouth,                            and advanced to the duodenum for ultrasound                            examination from the stomach and duodenum. The                            upper EUS was accomplished without difficulty. The                            patient tolerated the procedure. Findings:      ENDOSCOPIC FINDING: :      No gross lesions were noted in the entire esophagus.      The Z-line was irregular and was found 38 cm from the incisors.      A 1 cm hiatal hernia was present.      Patchy mildly erythematous mucosa  was found in the entire examined       stomach. Biopsies were taken with a cold forceps for histology and       Helicobacter pylori testing.      No gross lesions were noted in the duodenal bulb, in the first portion       of the duodenum and in the second portion of the duodenum.      ENDOSONOGRAPHIC FINDING: :      There was dilation in the common bile duct and in the common hepatic       duct which measured up to 9 mm.      One stone was visualized endosonographically in the common bile duct.       The stone measured 6 mm in greatest dimension. The stone was round. It       was hyperechoic and characterized by shadowing.      There was no sign of significant endosonographic abnormality in the       pancreatic head. The pancreatic duct measured up to 1 mm in diameter.       The pancreatic duct was thin in caliber.      Endosonographic  imaging in the visualized portion of the liver showed no       mass.      The celiac region was visualized. Impression:               EGD Impression:                           - No gross lesions in the entire esophagus. Z-line                            irregular, 38 cm from the incisors.                           - 1 cm hiatal hernia.                           - Erythematous mucosa in the stomach. Biopsied.                           - No gross lesions in the duodenal bulb, in the                            first portion of the duodenum and in the second                            portion of the duodenum.                           EUS Impression:                           - There was dilation in the common bile duct and in                            the common hepatic duct which measured up to 9 mm.                           -  One stone was visualized endosonographically in                            the common bile duct.                           - There was no sign of significant pathology in the                            pancreatic head. Moderate Sedation:      Not Applicable - Patient had care per Anesthesia. Recommendation:           - Proceed to scheduled ERCP for attempt at biliary                            cannulation.                           - Observe patient's clinical course.                           - Await path results.                           - Continue PPI daily.                           - The findings and recommendations were discussed                            with the patient.                           - The findings and recommendations were discussed                            with the patient's family. Procedure Code(s):        --- Professional ---                           [phone], Esophagogastroduodenoscopy, flexible,                            transoral; with endoscopic ultrasound examination                            limited to the esophagus, stomach or  duodenum, and                            adjacent structures                           43239, Esophagogastroduodenoscopy, flexible,                            transoral; with biopsy, single or multiple Diagnosis Code(s):        --- Professional ---  K22.89, Other specified disease of esophagus                           K44.9, Diaphragmatic hernia without obstruction or                            gangrene                           K31.89, Other diseases of stomach and duodenum                           K83.8, Other specified diseases of biliary tract                           K80.50, Calculus of bile duct without cholangitis                            or cholecystitis without obstruction                           R93.5, Abnormal findings on diagnostic imaging of                            other abdominal regions, including retroperitoneum CPT copyright 2022 [employer]. All rights reserved. The codes documented in this report are preliminary and upon coder review may  be revised to meet current compliance requirements. Aloha Finner, MD 08/05/2024 12:10:11 PM Number of Addenda: 0

## 2024-08-05 NOTE — Op Note (Signed)
 [facility] Patient Name: Allison White Procedure Date: 08/05/2024 MRN: [id number] Attending MD: Aloha Finner , MD, [id number] Date of Birth: [birth year] CSN: [id number] Age: 63 Admit Type: Inpatient Procedure:                ERCP Indications:              Bile duct stone(s), Abnormal endoscopic ultrasound                            of the biliary system, Elevated liver enzymes Providers:                Aloha Finner, MD Referring MD:              Medicines:                General Anesthesia, Diclofenac  100 mg rectal, Cipro                             400 mg IV, Glucagon  0.5 mg IV Complications:            No immediate complications. Estimated Blood Loss:     Estimated blood loss was minimal. Procedure:                Pre-Anesthesia Assessment:                           - Prior to the procedure, a History and Physical                            was performed, and patient medications and                            allergies were reviewed. The patient's tolerance of                            previous anesthesia was also reviewed. The risks                            and benefits of the procedure and the sedation                            options and risks were discussed with the patient.                            All questions were answered, and informed consent                            was obtained. Prior Anticoagulants: The patient has                            taken no anticoagulant or antiplatelet agents                            except for aspirin . ASA Grade Assessment: III - A  patient with severe systemic disease. After                            reviewing the risks and benefits, the patient was                            deemed in satisfactory condition to undergo the                            procedure.                           After obtaining informed consent, the scope was                            passed under  direct vision. Throughout the                            procedure, the patient's blood pressure, pulse, and                            oxygen saturations were monitored continuously. The                            [employer] D single use                            duodenoscope was introduced through the mouth, and                            used to inject contrast into and used to inject                            contrast into the bile duct. The ERCP was                            accomplished without difficulty. The patient                            tolerated the procedure. Scope In: Scope Out: Findings:      A scout film of the abdomen was obtained. Surgical clips, consistent       with a previous cholecystectomy, were seen in the area of the right       upper quadrant of the abdomen.      The esophagus was successfully intubated under direct vision without       detailed examination of the pharynx, larynx, and associated structures,       and upper GI tract. The major papilla was bulging and displaced       laterally.      A 0.035 inch x 260 cm straight Hydra Jagwire was passed into the biliary       tree in long position. The short-nosed traction sphincterotome was       passed over the guidewire and the bile duct was then deeply cannulated.       Contrast  was injected. I personally interpreted the bile duct images.       Ductal flow of contrast was adequate. Image quality was adequate.       Contrast extended to the hepatic ducts. Opacification of the entire       biliary tree except for the cystic duct and gallbladder was successful.       The middle third of the main bile duct contained filling defects thought       to be stones and sludge. The main bile duct was moderately dilated. The       largest diameter was 11 mm. An 8 mm biliary sphincterotomy was made with       a monofilament traction (standard) sphincterotome using ERBE       electrocautery. There  was no post-sphincterotomy bleeding. To discover       objects, the biliary tree was swept with a retrieval balloon distal to       proximal including both the right and left hepatic ducts. Sludge was       swept from the duct. Two stones were removed. No stones remained. An       occlusion cholangiogram was performed that showed no further significant       biliary pathology.      A pancreatogram was not performed.      The duodenoscope was withdrawn from the patient. Impression:               - The major papilla appeared to be bulging.                           - Filling defects consistent with stones and sludge                            was seen on the cholangiogram.                           - The entire main bile duct was moderately dilated.                           - Choledocholithiasis was found. Complete removal                            was accomplished by biliary sphincterotomy and                            balloon sweep. Moderate Sedation:      Not Applicable - Patient had care per Anesthesia. Recommendation:           - The patient will be observed post-procedure,                            until all discharge criteria are met.                           - Discharge patient to home.                           - Patient has a contact number available for  emergencies. The signs and symptoms of potential                            delayed complications were discussed with the                            patient. Return to normal activities tomorrow.                            Written discharge instructions were provided to the                            patient.                           - Low fat diet.                           - Observe patient's clinical course.                           - Watch for pancreatitis, bleeding, perforation,                            and cholangitis.                           - Check liver enzymes (AST, ALT, alkaline                             phosphatase, bilirubin) in 2 weeks.                           - The findings and recommendations were discussed                            with the patient.                           - The findings and recommendations were discussed                            with the patient's family. Procedure Code(s):        --- Professional ---                           [phone], Endoscopic retrograde                            cholangiopancreatography (ERCP); with removal of                            calculi/debris from biliary/pancreatic duct(s)                           43262, Endoscopic retrograde  cholangiopancreatography (ERCP); with                            sphincterotomy/papillotomy                           [phone], Endoscopic catheterization of the biliary                            ductal system, radiological supervision and                            interpretation Diagnosis Code(s):        --- Professional ---                           K83.8, Other specified diseases of biliary tract                           K80.50, Calculus of bile duct without cholangitis                            or cholecystitis without obstruction                           R74.8, Abnormal levels of other serum enzymes                           R93.2, Abnormal findings on diagnostic imaging of                            liver and biliary tract CPT copyright 2022 American Medical Association. All rights reserved. The codes documented in this report are preliminary and upon coder review may  be revised to meet current compliance requirements. Aloha Finner, MD 08/05/2024 12:50:47 PM Number of Addenda: 0

## 2024-08-05 NOTE — Anesthesia Postprocedure Evaluation (Signed)
 Anesthesia Post Note  Patient: Allison White  Procedure(s) Performed: ULTRASOUND, UPPER GI TRACT, ENDOSCOPIC ERCP, WITH INTERVENTION IF INDICATED     Patient location during evaluation: PACU Anesthesia Type: MAC Level of consciousness: awake and alert Pain management: pain level controlled Vital Signs Assessment: post-procedure vital signs reviewed and stable Respiratory status: spontaneous breathing, nonlabored ventilation, respiratory function stable and patient connected to nasal cannula oxygen Cardiovascular status: stable and blood pressure returned to baseline Postop Assessment: no apparent nausea or vomiting Anesthetic complications: no   No notable events documented.  Last Vitals:  Vitals:   08/05/24 1310 08/05/24 1320  BP: (!) 117/40 (!) 127/39  Pulse: 69 69  Resp: 15 19  Temp:    SpO2: 98% 94%    Last Pain:  Vitals:   08/05/24 1320  TempSrc:   PainSc: 0-No pain                 Cordella P Kayne Yuhas

## 2024-08-05 NOTE — Anesthesia Preprocedure Evaluation (Addendum)
 Anesthesia Evaluation  Patient identified by MRN, date of birth, ID band Patient awake    Reviewed: Allergy & Precautions, NPO status , Patient's Chart, lab work & pertinent test results  History of Anesthesia Complications (+) PONV and history of anesthetic complications  Airway Mallampati: II  TM Distance: >3 FB Neck ROM: Full    Dental no notable dental hx.    Pulmonary former smoker   Pulmonary exam normal        Cardiovascular hypertension, Pt. on medications and Pt. on home beta blockers + CAD, + Past MI, + Cardiac Stents and +CHF  + Cardiac Defibrillator  Rhythm:Regular Rate:Normal  ECHO 09/25:  1. Left ventricular ejection fraction, by estimation, is 30 to 35%. The  left ventricle has moderately decreased function. The left ventricle  demonstrates regional wall motion abnormalities (see scoring  diagram/findings for description). The left  ventricular internal cavity size was moderately dilated. Left ventricular  diastolic parameters are consistent with Grade I diastolic dysfunction  (impaired relaxation).   2. Right ventricular systolic function is low normal. The right  ventricular size is normal.   3. The mitral valve is degenerative. Mild to moderate mitral valve  regurgitation. No evidence of mitral stenosis.   4. The aortic valve was not well visualized. Aortic valve regurgitation  is not visualized. No aortic stenosis is present.   5. The inferior vena cava is normal in size with greater than 50%  respiratory variability, suggesting right atrial pressure of 3 mmHg.      Neuro/Psych  Headaches CVA, No Residual Symptoms  negative psych ROS   GI/Hepatic Neg liver ROS,GERD  ,,Bile duct abnormality    Endo/Other  diabetes    Renal/GU Renal disease  negative genitourinary   Musculoskeletal  (+) Arthritis , Osteoarthritis,    Abdominal Normal abdominal exam  (+)   Peds  Hematology Lab Results       Component                Value               Date                      WBC                      7.0                 06/01/2024                HGB                      15.9                06/01/2024                HCT                      46.7 (H)            06/01/2024                MCV                      96                  06/01/2024                PLT  309                 06/01/2024             Lab Results      Component                Value               Date                      NA                       142                 07/22/2024                K                        4.4                 07/22/2024                CO2                      29                  07/22/2024                GLUCOSE                  115 (H)             07/22/2024                BUN                      17                  07/22/2024                CREATININE               0.99                07/22/2024                CALCIUM                   9.8                 07/22/2024                GFR                      61.05               07/22/2024                EGFR                     50 (L)              06/01/2024                GFRNONAA                 59 (L)              06/02/2024  Anesthesia Other Findings   Reproductive/Obstetrics                              Anesthesia Physical Anesthesia Plan  ASA: 4  Anesthesia Plan: General   Post-op Pain Management:    Induction: Intravenous  PONV Risk Score and Plan: 4 or greater and Ondansetron , Dexamethasone  and Treatment may vary due to age or medical condition  Airway Management Planned: Mask and Oral ETT  Additional Equipment: None  Intra-op Plan:   Post-operative Plan: Extubation in OR  Informed Consent: I have reviewed the patients History and Physical, chart, labs and discussed the procedure including the risks, benefits and alternatives for the proposed anesthesia with the patient  or authorized representative who has indicated his/her understanding and acceptance.     Dental advisory given  Plan Discussed with: CRNA  Anesthesia Plan Comments:         Anesthesia Quick Evaluation

## 2024-08-05 NOTE — Telephone Encounter (Signed)
 The lab order has been entered. We will call the pt and remind her to get labs in 2-4 weeks  Recall follow up entered

## 2024-08-05 NOTE — H&P (Signed)
 GASTROENTEROLOGY PROCEDURE H&P NOTE   Primary Care Physician: Sherre Clapper, MD  HPI: Allison White is a 63 y.o. female who presents for EUS +/- ERCP for abnormal LFTs and right upper quadrant pain and concern for choledocholithiasis.  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   Left ICA stent placed for pseudoaneurysm, likely from dissection related to fibromuscular dysplasia009)   Palpitations    PONV (postoperative nausea and vomiting)    Pseudoaneurysm (HCC)    both carotids    Stroke (HCC) 06/10/2008   when carotid artery dissected, she was told she had a stroke (pt states she has had dissection on both sides); s/p left ICA stent 07/14/08   Vertigo    Past Surgical History:  Procedure Laterality Date   ABDOMINAL HYSTERECTOMY  08/16/2004   Uterus and Cervix (per pathology)   BAROREFLEX SYSTEM INSERTION  11/14/2023   BREAST LUMPECTOMY Right 07/2021   CARDIAC DEFIBRILLATOR PLACEMENT     CEREBRAL ANEURYSM REPAIR Left 2009   CESAREAN SECTION     CHOLECYSTECTOMY N/A 05/15/2024   Procedure: LAPAROSCOPIC CHOLECYSTECTOMY WITH INTRAOPERATIVE CHOLANGIOGRAM;  Surgeon: Lyndel Deward PARAS, MD;  Location: [facility] OR;  Service: General;  Laterality: N/A;   CORONARY ANGIOPLASTY WITH STENT PLACEMENT     RIGHT/LEFT HEART CATH AND CORONARY ANGIOGRAPHY N/A 04/07/2020   Procedure: RIGHT/LEFT HEART CATH AND CORONARY ANGIOGRAPHY;   Surgeon: Cherrie Toribio SAUNDERS, MD;  Location: [facility] INVASIVE CV LAB;  Service: Cardiovascular;  Laterality: N/A;   TUBAL LIGATION     Current Facility-Administered Medications  Medication Dose Route Frequency Provider Last Rate Last Admin   0.9 %  sodium chloride  infusion   Intravenous Continuous Mansouraty, Aloha Raddle., MD        Current Facility-Administered Medications:    0.9 %  sodium chloride  infusion, , Intravenous, Continuous, Mansouraty, Aloha Raddle., MD Allergies  Allergen Reactions   Tape Rash    PREFERS CLOTH OR NOTHING   Plavix [Clopidogrel Bisulfate] Other (See Comments)    Joint pain   Praluent  [Alirocumab ]     Headaches, gastrointestinal issues   Statins     Myalgia, neck pain   Tamoxifen  Nausea And Vomiting   Family History  Problem Relation Age of Onset   Thyroid  disease Mother    Gastric cancer Mother 51       w/ signet ring features   Prostate cancer Father        d. 11   Hypertension Brother    Breast cancer Maternal Aunt 82   Brain cancer Maternal Aunt 70   Prostate cancer Paternal Uncle        x2 pat uncles   Prostate cancer Maternal Grandfather        metastatic; d. 86   Cerebral aneurysm Paternal Grandmother        Nonruptured   Dementia Paternal Grandfather    Prostate cancer Paternal Grandfather  metastatic   Social History   Socioeconomic History   Marital status: Married    Spouse name: Tim   Number of children: 4   Years of education: Not on file   Highest education level: High school graduate  Occupational History   Not on file  Tobacco Use   Smoking status: Former    Current packs/day: 0.00    Average packs/day: 1 pack/day for 6.0 years (6.0 ttl pk-yrs)    Types: Cigarettes    Quit date: 51    Years since quitting: 42.7   Smokeless tobacco: Never   Tobacco comments:    Quit over 30 years ago  Vaping Use   Vaping status: Never Used  Substance and Sexual Activity   Alcohol use: Not Currently   Drug use: No   Sexual  activity: Not Currently  Other Topics Concern   Not on file  Social History Narrative   Lives with husband in a one-story home.     Right handed   Caffeine: very little   Social Drivers of Corporate investment banker Strain: Low Risk  (03/18/2024)   Overall Financial Resource Strain (CARDIA)    Difficulty of Paying Living Expenses: Not hard at all  Food Insecurity: No Food Insecurity (05/15/2024)   Hunger Vital Sign    Worried About Running Out of Food in the Last Year: Never true    Ran Out of Food in the Last Year: Never true  Transportation Needs: No Transportation Needs (05/15/2024)   PRAPARE - Administrator, Civil Service (Medical): No    Lack of Transportation (Non-Medical): No  Physical Activity: Sufficiently Active (03/18/2024)   Exercise Vital Sign    Days of Exercise per Week: 5 days    Minutes of Exercise per Session: 30 min  Stress: No Stress Concern Present (03/18/2024)   [employer] of Occupational Health - Occupational Stress Questionnaire    Feeling of Stress : Not at all  Social Connections: Moderately Integrated (03/18/2024)   Social Connection and Isolation Panel    Frequency of Communication with Friends and Family: More than three times a week    Frequency of Social Gatherings with Friends and Family: Three times a week    Attends Religious Services: More than 4 times per year    Active Member of Clubs or Organizations: No    Attends Banker Meetings: Never    Marital Status: Married  Catering manager Violence: Not At Risk (05/15/2024)   Humiliation, Afraid, Rape, and Kick questionnaire    Fear of Current or Ex-Partner: No    Emotionally Abused: No    Physically Abused: No    Sexually Abused: No    Physical Exam: There were no vitals filed for this visit. There is no height or weight on file to calculate BMI. GEN: NAD EYE: Sclerae anicteric ENT: MMM CV: Non-tachycardic GI: Soft, NT/ND NEURO:  Alert & Oriented x 3  Lab  Results: No results for input(s): WBC, HGB, HCT, PLT in the last 72 hours. BMET No results for input(s): NA, K, CL, CO2, GLUCOSE, BUN, CREATININE, CALCIUM  in the last 72 hours. LFT No results for input(s): PROT, ALBUMIN, AST, ALT, ALKPHOS, BILITOT, BILIDIR, IBILI in the last 72 hours. PT/INR No results for input(s): LABPROT, INR in the last 72 hours.   Impression / Plan: This is a 63 y.o.female who presents for EUS +/- ERCP for abnormal LFTs and right upper quadrant pain and concern for choledocholithiasis.  The  risks of an EUS including intestinal perforation, bleeding, infection, aspiration, and medication effects were discussed as was the possibility it may not give a definitive diagnosis if a biopsy is performed.  When a biopsy of the pancreas is done as part of the EUS, there is an additional risk of pancreatitis at the rate of about 1-2%.  It was explained that procedure related pancreatitis is typically mild, although it can be severe and even life threatening, which is why we do not perform random pancreatic biopsies and only biopsy a lesion/area we feel is concerning enough to warrant the risk.  The risks of an ERCP were discussed at length, including but not limited to the risk of perforation, bleeding, abdominal pain, post-ERCP pancreatitis (while usually mild can be severe and even life threatening).   The risks and benefits of endoscopic evaluation/treatment were discussed with the patient and/or family; these include but are not limited to the risk of perforation, infection, bleeding, missed lesions, lack of diagnosis, severe illness requiring hospitalization, as well as anesthesia and sedation related illnesses.  The patient's history has been reviewed, patient examined, no change in status, and deemed stable for procedure.  The patient and/or family is agreeable to proceed.    Aloha Finner, MD [facility]oscopy Office # [phone]

## 2024-08-05 NOTE — Telephone Encounter (Signed)
-----   Message from [facility] sent at 08/05/2024 12:52 PM EDT ----- Regarding: Followup Allison White, This patient needs a hepatic function panel in 2 to 4 weeks. Clinic follow-up with APP or myself in next 2 to 3 months. Will discuss how she has done post ERCP and also discussed colon cancer screening methods. Thanks. GM

## 2024-08-05 NOTE — Transfer of Care (Signed)
 Immediate Anesthesia Transfer of Care Note  Patient: Allison White  Procedure(s) Performed: ULTRASOUND, UPPER GI TRACT, ENDOSCOPIC ERCP, WITH INTERVENTION IF INDICATED  Patient Location: PACU  Anesthesia Type:General  Level of Consciousness: awake and alert   Airway & Oxygen Therapy: Patient Spontanous Breathing and Patient connected to face mask oxygen  Post-op Assessment: Report given to RN and Post -op Vital signs reviewed and stable  Post vital signs: Reviewed and stable  Last Vitals:  Vitals Value Taken Time  BP 139/60 08/05/24 12:55  Temp    Pulse 72 08/05/24 12:59  Resp 15 08/05/24 12:59  SpO2 98 % 08/05/24 12:59  Vitals shown include unfiled device data.  Last Pain:  Vitals:   08/05/24 1108  TempSrc: (P) Temporal  PainSc: (P) 0-No pain      Patients Stated Pain Goal: (P) 0 (08/05/24 1108)  Complications: No notable events documented.

## 2024-08-05 NOTE — Anesthesia Procedure Notes (Signed)
 Procedure Name: Intubation Date/Time: 08/05/2024 11:39 AM  Performed by: Levander Lani BIRCH, CRNAPre-anesthesia Checklist: Patient identified, Emergency Drugs available, Suction available, Patient being monitored and Timeout performed Patient Re-evaluated:Patient Re-evaluated prior to induction Oxygen Delivery Method: Circle system utilized Preoxygenation: Pre-oxygenation with 100% oxygen Induction Type: IV induction Ventilation: Mask ventilation without difficulty Laryngoscope Size: Miller and 2 Grade View: Grade I Tube type: Oral Tube size: 7.0 mm Airway Equipment and Method: Stylet Placement Confirmation: ETT inserted through vocal cords under direct vision, positive ETCO2 and breath sounds checked- equal and bilateral Secured at: 22 cm Tube secured with: Tape Dental Injury: Teeth and Oropharynx as per pre-operative assessment

## 2024-08-06 ENCOUNTER — Encounter (HOSPITAL_COMMUNITY): Payer: Self-pay | Admitting: Gastroenterology

## 2024-08-06 ENCOUNTER — Ambulatory Visit: Payer: Self-pay | Admitting: Gastroenterology

## 2024-08-06 LAB — SURGICAL PATHOLOGY

## 2024-08-06 NOTE — Addendum Note (Signed)
 Addendum  created 08/06/24 1041 by Dorethea Cordella SQUIBB, DO   Clinical Note Signed

## 2024-08-12 NOTE — Progress Notes (Signed)
 "  Subjective:  Patient ID: Allison White, female    DOB: Dec 18, 1960  Age: 63 y.o. MRN: [id number]  Chief Complaint  Patient presents with   Medical Management of Chronic Issues    HPI: Discussed the use of AI scribe software for clinical note transcription with the patient, who gave verbal consent to proceed.  History of Present Illness Allison White is a 63 year old female with heart failure who presents for follow-up and blood work.  Heart failure and cardiac status - History of heart failure with recent barostem device implantation, resulting in improved energy levels - Ejection fraction improved from 25% to 35% - No current chest pain or respiratory symptoms - Scheduled for cardiology follow-up in six weeks - Current cardiac medications include Toprol  XL, digoxin , spironolactone , Corlanor , and aspirin   Postoperative recovery - Underwent ERCP approximately one week ago with smooth recovery. Scheduled to get LFTs in one week.  Lipid and glycemic control - Recent blood work: total cholesterol 146 mg/dL, LDL 60 mg/dL, triglycerides 849 mg/dL, HDL 56 mg/dL - Hemoglobin J8r improved from 6.8% to 6.0% - Current lipid-lowering medications include fenofibrate  and Repatha  - Scheduled for blood work next week to monitor liver function       08/13/2024    9:06 AM 05/04/2024    9:30 AM 03/18/2024   10:55 AM 07/10/2023   11:07 AM 03/27/2023   11:03 AM  Depression screen PHQ 2/9  Decreased Interest 0 1 0 0 2  Down, Depressed, Hopeless 0 0 0 0 0  PHQ - 2 Score 0 1 0 0 2  Altered sleeping 1 3  2 2   Tired, decreased energy 1 3  3 2   Change in appetite 0 0  2 2  Feeling bad or failure about yourself  0 0  0 0  Trouble concentrating 0 0  0 0  Moving slowly or fidgety/restless 0 1  2 1   Suicidal thoughts 0 0  0 0  PHQ-9 Score 2 8  9 9   Difficult doing work/chores Not difficult at all Not difficult at all  Somewhat difficult Somewhat difficult        05/04/2024    9:30 AM  Fall Risk    Falls in the past year? 0  Number falls in past yr: 0  Injury with Fall? 0  Risk for fall due to : No Fall Risks  Follow up Falls evaluation completed    Patient Care Team: Sherre Clapper, MD as PCP - General (Family Medicine) Inocencio Soyla Lunger, MD as PCP - Electrophysiology (Cardiology) Bensimhon, Toribio SAUNDERS, MD as PCP - Advanced Heart Failure (Cardiology) Ezzard Valaria LABOR, MD as Consulting Physician (Oncology) Jomarie Agent, MD as Consulting Physician (Radiation Oncology) Nyle Rankin POUR, [facility] (Inactive) (Pharmacist) Dolphus Carrion, MD as Consulting Physician (Interventional Radiology) Ezzard Valaria LABOR, MD as Consulting Physician (Oncology) Vannie Elsie HERO, OD (Ophthalmology) Misenheimer, Evalene, MD as Consulting Physician (Unknown Physician Specialty) Lesia Ozell Barter, PA-C as Physician Assistant (Cardiology)   Review of Systems  Constitutional:  Negative for chills, fatigue and fever.  HENT:  Negative for congestion, ear pain and sore throat.   Respiratory:  Negative for cough and shortness of breath.   Cardiovascular:  Positive for leg swelling. Negative for chest pain.  Gastrointestinal:  Negative for abdominal pain, constipation, diarrhea, nausea and vomiting.  Genitourinary:  Negative for dysuria and urgency.  Musculoskeletal:  Negative for arthralgias and myalgias.  Skin:  Negative for rash.  Neurological:  Negative for dizziness and headaches.  Psychiatric/Behavioral:  Negative for dysphoric mood. The patient is not nervous/anxious.     Current Outpatient Medications on File Prior to Visit  Medication Sig Dispense Refill   aspirin  81 MG EC tablet Take 81 mg by mouth in the morning.     bismuth subsalicylate (PEPTO BISMOL) 262 MG chewable tablet Chew 524 mg by mouth as needed for indigestion.     dapagliflozin  propanediol (FARXIGA ) 10 MG TABS tablet Take 1 tablet (10 mg total) by mouth daily. 90 tablet 3   digoxin  (LANOXIN ) 0.125 MG tablet Take 1  tablet by mouth once daily 90 tablet 0   fenofibrate  160 MG tablet Take 1 tablet (160 mg total) by mouth daily. 90 tablet 1   HYDROcodone -acetaminophen  (NORCO) 7.5-325 MG tablet Take 1 tablet by mouth every 6 (six) hours as needed for moderate pain (pain score 4-6). 60 tablet 0   icosapent  Ethyl (VASCEPA ) 1 g capsule Take 2 capsules by mouth twice daily 120 capsule 0   ivabradine  (CORLANOR ) 7.5 MG TABS tablet Take 1 tablet (7.5 mg total) by mouth 2 (two) times daily with a meal. 180 tablet 3   loratadine  (CLARITIN ) 10 MG tablet Take 10 mg by mouth in the morning. [facility] brand     metoprolol  succinate (TOPROL -XL) 50 MG 24 hr tablet Take 1 tablet by mouth twice daily 180 tablet 3   nitroGLYCERIN  (NITROSTAT ) 0.4 MG SL tablet Place 0.4 mg under the tongue every 5 (five) minutes as needed for chest pain.     pantoprazole  (PROTONIX ) 40 MG tablet Take 40 mg by mouth daily.     raloxifene  (EVISTA ) 60 MG tablet Take 1 tablet by mouth once daily 90 tablet 3   REPATHA  SURECLICK 140 MG/ML SOAJ INJECT 140 MG INTO THE SKIN EVERY 14 DAYS 2 mL 0   SIMETHICONE PO Take 80 mg by mouth every 6 (six) hours as needed for flatulence.     spironolactone  (ALDACTONE ) 25 MG tablet Take 1 tablet (25 mg total) by mouth daily. 90 tablet 0   torsemide  (DEMADEX ) 20 MG tablet Take 20 mg by mouth as needed.     No current facility-administered medications on file prior to visit.   Past Medical History:  Diagnosis Date   AICD (automatic cardioverter/defibrillator) present 02/25/2017   Biotronik- Dual PPM/AICD   Arthritis    Cancer (HCC) 2022   Rt Breast   CHF (congestive heart failure) (HCC)    Coronary artery disease    Diabetes mellitus without complication (HCC)    Pt denies having diabetes   Fibromuscular dysplasia    FMD (facioscapulohumeral muscular dystrophy) (HCC)    Headache(784.0)    Heart failure (HCC)    Hyperlipidemia    MI (myocardial infarction) (HCC) 11/19/2016   s/p DES LAD   Migraine     Nonruptured cerebral aneurysm, internal carotid artery 07/14/2008   Left ICA stent placed for pseudoaneurysm, likely from dissection related to fibromuscular dysplasia009)   Palpitations    PONV (postoperative nausea and vomiting)    Pseudoaneurysm    both carotids    Stroke (HCC) 06/10/2008   when carotid artery dissected, she was told she had a stroke (pt states she has had dissection on both sides); s/p left ICA stent 07/14/08   Vertigo    Past Surgical History:  Procedure Laterality Date   ABDOMINAL HYSTERECTOMY  08/16/2004   Uterus and Cervix (per pathology)   BAROREFLEX SYSTEM INSERTION  11/14/2023   BREAST LUMPECTOMY Right 07/2021   CARDIAC DEFIBRILLATOR PLACEMENT  CEREBRAL ANEURYSM REPAIR Left 2009   CESAREAN SECTION     CHOLECYSTECTOMY N/A 05/15/2024   Procedure: LAPAROSCOPIC CHOLECYSTECTOMY WITH INTRAOPERATIVE CHOLANGIOGRAM;  Surgeon: Lyndel Deward PARAS, MD;  Location: [facility] OR;  Service: General;  Laterality: N/A;   CORONARY ANGIOPLASTY WITH STENT PLACEMENT     ERCP N/A 08/05/2024   Procedure: ERCP, WITH INTERVENTION IF INDICATED;  Surgeon: Wilhelmenia Aloha Raddle., MD;  Location: [facility];  Service: Gastroenterology;  Laterality: N/A;   ESOPHAGOGASTRODUODENOSCOPY N/A 08/05/2024   Procedure: EGD (ESOPHAGOGASTRODUODENOSCOPY);  Surgeon: Wilhelmenia Aloha Raddle., MD;  Location: [facility];  Service: Gastroenterology;  Laterality: N/A;   EUS N/A 08/05/2024   Procedure: ULTRASOUND, UPPER GI TRACT, ENDOSCOPIC;  Surgeon: Wilhelmenia Aloha Raddle., MD;  Location: [facility];  Service: Gastroenterology;  Laterality: N/A;   RIGHT/LEFT HEART CATH AND CORONARY ANGIOGRAPHY N/A 04/07/2020   Procedure: RIGHT/LEFT HEART CATH AND CORONARY ANGIOGRAPHY;  Surgeon: Cherrie Toribio SAUNDERS, MD;  Location: [facility];  Service: Cardiovascular;  Laterality: N/A;   TUBAL LIGATION      Family History  Problem Relation Age of Onset   Thyroid  disease Mother    Gastric cancer Mother 31        w/ signet ring features   Prostate cancer Father        d. 75   Hypertension Brother    Breast cancer Maternal Aunt 40   Brain cancer Maternal Aunt 45   Prostate cancer Paternal Uncle        x2 pat uncles   Prostate cancer Maternal Grandfather        metastatic; d. 36   Cerebral aneurysm Paternal Grandmother        Nonruptured   Dementia Paternal Grandfather    Prostate cancer Paternal Grandfather        metastatic   Social History   Socioeconomic History   Marital status: Married    Spouse name: Tim   Number of children: 4   Years of education: Not on file   Highest education level: High school graduate  Occupational History   Not on file  Tobacco Use   Smoking status: Former    Current packs/day: 0.00    Average packs/day: 1 pack/day for 6.0 years (6.0 ttl pk-yrs)    Types: Cigarettes    Quit date: 96    Years since quitting: 42.7   Smokeless tobacco: Never   Tobacco comments:    Quit over 30 years ago  Vaping Use   Vaping status: Never Used  Substance and Sexual Activity   Alcohol use: Not Currently   Drug use: No   Sexual activity: Not Currently  Other Topics Concern   Not on file  Social History Narrative   Lives with husband in a [place] home.     Right handed   Caffeine: very little   Social Drivers of Corporate Investment Banker Strain: Low Risk  (03/18/2024)   Overall Financial Resource Strain (CARDIA)    Difficulty of Paying Living Expenses: Not hard at all  Food Insecurity: No Food Insecurity (05/15/2024)   Hunger Vital Sign    Worried About Running Out of Food in the Last Year: Never true    Ran Out of Food in the Last Year: Never true  Transportation Needs: No Transportation Needs (05/15/2024)   PRAPARE - Administrator, Civil Service (Medical): No    Lack of Transportation (Non-Medical): No  Physical Activity: Sufficiently Active (03/18/2024)   Exercise Vital Sign  Days of Exercise per Week: 5 days    Minutes of Exercise per  Session: 30 min  Stress: No Stress Concern Present (03/18/2024)   [employer] of Occupational Health - Occupational Stress Questionnaire    Feeling of Stress : Not at all  Social Connections: Moderately Integrated (03/18/2024)   Social Connection and Isolation Panel    Frequency of Communication with Friends and Family: More than three times a week    Frequency of Social Gatherings with Friends and Family: Three times a week    Attends Religious Services: More than 4 times per year    Active Member of Clubs or Organizations: No    Attends Banker Meetings: Never    Marital Status: Married    Objective:  BP 132/64   Pulse 78   Temp 97.9 F (36.6 C)   Ht 5' 5 (1.651 m)   Wt 198 lb (89.8 kg)   SpO2 99%   BMI 32.95 kg/m      08/13/2024    9:04 AM 08/05/2024    1:20 PM 08/05/2024    1:10 PM  BP/Weight  Systolic BP 132 127 117  Diastolic BP 64 39 40  Wt. (Lbs) 198    BMI 32.95 kg/m2      Physical Exam Vitals reviewed.  Constitutional:      Appearance: Normal appearance. She is normal weight.  Neck:     Vascular: No carotid bruit.  Cardiovascular:     Rate and Rhythm: Normal rate and regular rhythm.     Heart sounds: Normal heart sounds.  Pulmonary:     Effort: Pulmonary effort is normal. No respiratory distress.     Breath sounds: Normal breath sounds.  Abdominal:     General: Abdomen is flat. Bowel sounds are normal.     Palpations: Abdomen is soft.     Tenderness: There is no abdominal tenderness.  Musculoskeletal:     Right lower leg: Edema present.     Left lower leg: Edema present.  Neurological:     Mental Status: She is alert and oriented to person, place, and time.  Psychiatric:        Mood and Affect: Mood normal.        Behavior: Behavior normal.      Diabetic foot exam was performed with the following findings:   No deformities, ulcerations, or other skin breakdown Normal sensation of 10g monofilament Intact posterior tibialis and  dorsalis pedis pulses      Lab Results  Component Value Date   WBC 6.3 08/13/2024   HGB 16.5 (H) 08/13/2024   HCT 49.5 (H) 08/13/2024   PLT 273 08/13/2024   GLUCOSE 120 (H) 08/13/2024   CHOL 157 05/04/2024   TRIG 347 (H) 05/04/2024   HDL 47 05/04/2024   LDLCALC 57 05/04/2024   ALT 18 08/13/2024   AST 17 08/13/2024   NA 142 08/13/2024   K 4.9 08/13/2024   CL 105 08/13/2024   CREATININE 1.01 (H) 08/13/2024   BUN 19 08/13/2024   CO2 22 08/13/2024   TSH 3.720 12/24/2022   INR 1.1 11/07/2023   HGBA1C 6.0 08/13/2024    Results for orders placed or performed in visit on 08/13/24  POCT glycosylated hemoglobin (Hb A1C)   Collection Time: 08/13/24  9:23 AM  Result Value Ref Range   Hemoglobin A1C     HbA1c POC (<> result, manual entry) 6.0 4.0 - 5.6 %   HbA1c, POC (prediabetic range)  HbA1c, POC (controlled diabetic range)    POCT Lipid Panel   Collection Time: 08/13/24  9:26 AM  Result Value Ref Range   TC 146    HDL 56    TRG 150    LDL 60    Non-HDL 90    TC/HDL    CBC with Differential/Platelet   Collection Time: 08/13/24 10:04 AM  Result Value Ref Range   WBC 6.3 3.4 - 10.8 x10E3/uL   RBC 5.21 3.77 - 5.28 x10E6/uL   Hemoglobin 16.5 (H) 11.1 - 15.9 g/dL   Hematocrit 50.4 (H) 65.9 - 46.6 %   MCV 95 79 - 97 fL   MCH 31.7 26.6 - 33.0 pg   MCHC 33.3 31.5 - 35.7 g/dL   RDW 86.9 88.2 - 84.5 %   Platelets 273 150 - 450 x10E3/uL   Neutrophils 57 Not Estab. %   Lymphs 28 Not Estab. %   Monocytes 9 Not Estab. %   Eos 5 Not Estab. %   Basos 1 Not Estab. %   Neutrophils Absolute 3.6 1.4 - 7.0 x10E3/uL   Lymphocytes Absolute 1.7 0.7 - 3.1 x10E3/uL   Monocytes Absolute 0.6 0.1 - 0.9 x10E3/uL   EOS (ABSOLUTE) 0.3 0.0 - 0.4 x10E3/uL   Basophils Absolute 0.0 0.0 - 0.2 x10E3/uL   Immature Granulocytes 0 Not Estab. %   Immature Grans (Abs) 0.0 0.0 - 0.1 x10E3/uL  Comprehensive metabolic panel with GFR   Collection Time: 08/13/24 10:04 AM  Result Value Ref Range    Glucose 120 (H) 70 - 99 mg/dL   BUN 19 8 - 27 mg/dL   Creatinine, Ser 8.98 (H) 0.57 - 1.00 mg/dL   eGFR 63 >40 fO/fpw/8.26   BUN/Creatinine Ratio 19 12 - 28   Sodium 142 134 - 144 mmol/L   Potassium 4.9 3.5 - 5.2 mmol/L   Chloride 105 96 - 106 mmol/L   CO2 22 20 - 29 mmol/L   Calcium  10.1 8.7 - 10.3 mg/dL   Total Protein 6.7 6.0 - 8.5 g/dL   Albumin 4.4 3.9 - 4.9 g/dL   Globulin, Total 2.3 1.5 - 4.5 g/dL   Bilirubin Total 0.4 0.0 - 1.2 mg/dL   Alkaline Phosphatase 100 49 - 135 IU/L   AST 17 0 - 40 IU/L   ALT 18 0 - 32 IU/L  .  Assessment & Plan:   Assessment & Plan Type 2 diabetes mellitus with stage 3a chronic kidney disease, without long-term current use of insulin (HCC) Well-controlled with improved hemoglobin A1c from 6.8 to 6.0. Farxiga  used for diabetes management and heart failure prevention. - Continue Farxiga  10 mg once daily. Orders:   POCT glycosylated hemoglobin (Hb A1C)   CBC with Differential/Platelet   Comprehensive metabolic panel with GFR  Mixed hyperlipidemia Recent cholesterol levels improved: total cholesterol 146, LDL 60, triglycerides 150, HDL 56. - Continue Vascepa  1 g, two capsules twice daily. - Continue fenofibrate  160 mg once daily. - Continue Repatha  140 mg every two weeks. Orders:   POCT Lipid Panel   Comprehensive metabolic panel with GFR  Chronic systolic congestive heart failure (HCC) Managed with medications. Improved energy levels reported with barostem device. Cardiology evaluation suggested possible need for further intervention if symptoms persist. - Continue torsemide  20 mg as needed. - Follow up with cardiologist, Dr. Dellis in six weeks.    Coronary artery disease involving native coronary artery of native heart with angina pectoris Managed with medications.  - Continue aspirin  81 mg daily. -  Continue Toprol  XL 50 mg twice daily. - Continue repatha , vascepa , and fenofibrate   Orders:   POCT Lipid Panel  Malignant neoplasm  of upper-outer quadrant of left breast in female, estrogen receptor positive (HCC) Managed with raloxifene . Mammogram due. - Continue raloxifene  60 mg once daily. - Schedule mammogram at [facility] in [place].    Encounter for immunization  Orders:   Flu vaccine, recombinant, trivalent, inj   No orders of the defined types were placed in this encounter.   Orders Placed This Encounter  Procedures   Flu vaccine, recombinant, trivalent, inj   CBC with Differential/Platelet   Comprehensive metabolic panel with GFR   POCT Lipid Panel   POCT glycosylated hemoglobin (Hb A1C)      I,Marla I Leal-Borjas,acting as a scribe for Abigail Free, MD.,have documented all relevant documentation on the behalf of Abigail Free, MD,as directed by  Abigail Free, MD while in the presence of Abigail Free, MD.   Follow-up: Return in about 4 months (around 12/13/2024) for chronic follow up.  An After Visit Summary was printed and given to the patient.  I attest that I have reviewed this visit and agree with the plan scribed by my staff.   Abigail Free, MD [facility] [phone]  "

## 2024-08-12 NOTE — Assessment & Plan Note (Addendum)
 Well-controlled with improved hemoglobin A1c from 6.8 to 6.0. Farxiga  used for diabetes management and heart failure prevention. - Continue Farxiga  10 mg once daily. Orders:   POCT glycosylated hemoglobin (Hb A1C)   CBC with Differential/Platelet   Comprehensive metabolic panel with GFR

## 2024-08-12 NOTE — Assessment & Plan Note (Addendum)
 Managed with medications. Cardiologist noted heart function concerns, but she reports improvement with barostem device. - Continue aspirin  81 mg daily. - Continue Toprol  XL 50 mg twice daily. - Continue digoxin  0.125 mg once daily. - Continue spironolactone  25 mg once daily. - Continue Corlanor  7.5 mg twice daily. Orders:   POCT Lipid Panel

## 2024-08-12 NOTE — Assessment & Plan Note (Addendum)
 Managed with medications. Improved energy levels reported with barostem device. Cardiology evaluation suggested possible need for further intervention if symptoms persist. - Continue torsemide  20 mg as needed. - Follow up with cardiologist, Dr. Dellis in six weeks.

## 2024-08-12 NOTE — Assessment & Plan Note (Addendum)
 Recent cholesterol levels improved: total cholesterol 146, LDL 60, triglycerides 150, HDL 56. - Continue Vascepa  1 g, two capsules twice daily. - Continue fenofibrate  160 mg once daily. - Continue Repatha  140 mg every two weeks. Orders:   POCT Lipid Panel   Comprehensive metabolic panel with GFR

## 2024-08-13 ENCOUNTER — Encounter: Payer: Self-pay | Admitting: Family Medicine

## 2024-08-13 ENCOUNTER — Ambulatory Visit: Admitting: Family Medicine

## 2024-08-13 VITALS — BP 132/64 | HR 78 | Temp 97.9°F | Ht 65.0 in | Wt 198.0 lb

## 2024-08-13 DIAGNOSIS — I25119 Atherosclerotic heart disease of native coronary artery with unspecified angina pectoris: Secondary | ICD-10-CM | POA: Diagnosis not present

## 2024-08-13 DIAGNOSIS — N1831 Chronic kidney disease, stage 3a: Secondary | ICD-10-CM

## 2024-08-13 DIAGNOSIS — E1122 Type 2 diabetes mellitus with diabetic chronic kidney disease: Secondary | ICD-10-CM | POA: Diagnosis not present

## 2024-08-13 DIAGNOSIS — E782 Mixed hyperlipidemia: Secondary | ICD-10-CM | POA: Diagnosis not present

## 2024-08-13 DIAGNOSIS — I5022 Chronic systolic (congestive) heart failure: Secondary | ICD-10-CM | POA: Diagnosis not present

## 2024-08-13 DIAGNOSIS — Z23 Encounter for immunization: Secondary | ICD-10-CM

## 2024-08-13 DIAGNOSIS — C50412 Malignant neoplasm of upper-outer quadrant of left female breast: Secondary | ICD-10-CM

## 2024-08-13 DIAGNOSIS — Z170 Estrogen receptor positive status [ER+]: Secondary | ICD-10-CM

## 2024-08-13 LAB — POCT GLYCOSYLATED HEMOGLOBIN (HGB A1C): HbA1c POC (<> result, manual entry): 6 % (ref 4.0–5.6)

## 2024-08-13 LAB — POCT LIPID PANEL
HDL: 56
LDL: 60
Non-HDL: 90
TC: 146
TRG: 150

## 2024-08-13 NOTE — Patient Instructions (Signed)
  VISIT SUMMARY: Today, you had a follow-up appointment to check on your heart failure, abdominal distension, gastrointestinal symptoms, and postoperative recovery. Your heart function has improved, and your energy levels are better. Your cholesterol and blood sugar levels are also well-controlled. We discussed your current medications and scheduled necessary follow-ups.  YOUR PLAN: CHRONIC SYSTOLIC HEART FAILURE: Your heart failure is being managed with medications, and your energy levels have improved with the barostem device. -Continue taking torsemide  20 mg as needed. -Continue taking aspirin  81 mg daily. -Continue taking Toprol  XL 50 mg twice daily. -Continue taking digoxin  0.125 mg once daily. -Continue taking spironolactone  25 mg once daily. -Continue taking Corlanor  7.5 mg twice daily. -Follow up with your cardiologist in six weeks.  TYPE 2 DIABETES MELLITUS WITH DIABETIC CHRONIC KIDNEY DISEASE: Your diabetes is well-controlled, and your hemoglobin A1c has improved from 6.8 to 6.0. -Continue taking Farxiga  10 mg once daily.  MIXED HYPERLIPIDEMIA: Your cholesterol levels have improved with your current medications. -No changes recommended.   BREAST CANCER: Your breast cancer is being managed with raloxifene . It is time for your mammogram. -Continue taking raloxifene  60 mg once daily. -Schedule your mammogram at [facility] in [place].                        Contains text generated by Abridge.                                 Contains text generated by Abridge.

## 2024-08-14 LAB — CBC WITH DIFFERENTIAL/PLATELET
Basophils Absolute: 0 x10E3/uL (ref 0.0–0.2)
Basos: 1 %
EOS (ABSOLUTE): 0.3 x10E3/uL (ref 0.0–0.4)
Eos: 5 %
Hematocrit: 49.5 % — ABNORMAL HIGH (ref 34.0–46.6)
Hemoglobin: 16.5 g/dL — ABNORMAL HIGH (ref 11.1–15.9)
Immature Grans (Abs): 0 x10E3/uL (ref 0.0–0.1)
Immature Granulocytes: 0 %
Lymphocytes Absolute: 1.7 x10E3/uL (ref 0.7–3.1)
Lymphs: 28 %
MCH: 31.7 pg (ref 26.6–33.0)
MCHC: 33.3 g/dL (ref 31.5–35.7)
MCV: 95 fL (ref 79–97)
Monocytes Absolute: 0.6 x10E3/uL (ref 0.1–0.9)
Monocytes: 9 %
Neutrophils Absolute: 3.6 x10E3/uL (ref 1.4–7.0)
Neutrophils: 57 %
Platelets: 273 x10E3/uL (ref 150–450)
RBC: 5.21 x10E6/uL (ref 3.77–5.28)
RDW: 13 % (ref 11.7–15.4)
WBC: 6.3 x10E3/uL (ref 3.4–10.8)

## 2024-08-14 LAB — COMPREHENSIVE METABOLIC PANEL WITH GFR
ALT: 18 IU/L (ref 0–32)
AST: 17 IU/L (ref 0–40)
Albumin: 4.4 g/dL (ref 3.9–4.9)
Alkaline Phosphatase: 100 IU/L (ref 49–135)
BUN/Creatinine Ratio: 19 (ref 12–28)
BUN: 19 mg/dL (ref 8–27)
Bilirubin Total: 0.4 mg/dL (ref 0.0–1.2)
CO2: 22 mmol/L (ref 20–29)
Calcium: 10.1 mg/dL (ref 8.7–10.3)
Chloride: 105 mmol/L (ref 96–106)
Creatinine, Ser: 1.01 mg/dL — ABNORMAL HIGH (ref 0.57–1.00)
Globulin, Total: 2.3 g/dL (ref 1.5–4.5)
Glucose: 120 mg/dL — ABNORMAL HIGH (ref 70–99)
Potassium: 4.9 mmol/L (ref 3.5–5.2)
Sodium: 142 mmol/L (ref 134–144)
Total Protein: 6.7 g/dL (ref 6.0–8.5)
eGFR: 63 mL/min/1.73 (ref >59)

## 2024-08-14 NOTE — Assessment & Plan Note (Signed)
 Managed with raloxifene . Mammogram due. - Continue raloxifene  60 mg once daily. - Schedule mammogram at [facility] in [place].

## 2024-08-15 ENCOUNTER — Ambulatory Visit: Payer: Self-pay | Admitting: Family Medicine

## 2024-08-16 DIAGNOSIS — L400 Psoriasis vulgaris: Secondary | ICD-10-CM | POA: Diagnosis not present

## 2024-08-17 ENCOUNTER — Encounter (HOSPITAL_BASED_OUTPATIENT_CLINIC_OR_DEPARTMENT_OTHER): Payer: Self-pay | Admitting: Radiology

## 2024-08-17 ENCOUNTER — Ambulatory Visit (INDEPENDENT_AMBULATORY_CARE_PROVIDER_SITE_OTHER)
Admission: RE | Admit: 2024-08-17 | Discharge: 2024-08-17 | Disposition: A | Discharge status: Home / Self Care | Source: Ambulatory Visit | Attending: Family Medicine | Admitting: Family Medicine

## 2024-08-17 ENCOUNTER — Other Ambulatory Visit: Payer: Self-pay | Admitting: Family Medicine

## 2024-08-17 DIAGNOSIS — Z1231 Encounter for screening mammogram for malignant neoplasm of breast: Secondary | ICD-10-CM | POA: Diagnosis not present

## 2024-08-17 DIAGNOSIS — I25119 Atherosclerotic heart disease of native coronary artery with unspecified angina pectoris: Secondary | ICD-10-CM

## 2024-08-23 ENCOUNTER — Ambulatory Visit: Payer: Self-pay | Admitting: Gastroenterology

## 2024-08-23 ENCOUNTER — Other Ambulatory Visit (INDEPENDENT_AMBULATORY_CARE_PROVIDER_SITE_OTHER)

## 2024-08-23 DIAGNOSIS — K839 Disease of biliary tract, unspecified: Secondary | ICD-10-CM | POA: Diagnosis not present

## 2024-08-23 LAB — HEPATIC FUNCTION PANEL
ALT: 15 U/L (ref 0–35)
AST: 16 U/L (ref 0–37)
Albumin: 4.1 g/dL (ref 3.5–5.2)
Alkaline Phosphatase: 72 U/L (ref 39–117)
Bilirubin, Direct: 0.1 mg/dL (ref 0.0–0.3)
Total Bilirubin: 0.5 mg/dL (ref 0.2–1.2)
Total Protein: 6.7 g/dL (ref 6.0–8.3)

## 2024-08-24 NOTE — Progress Notes (Unsigned)
 [facility]  [address] [place],  [place]  [address] [phone]  Clinic Day:  02/24/2024  Referring physician: Sherre Clapper, MD  HISTORY OF PRESENT ILLNESS:  The patient is a 63 y.o. 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  She comes in today for routine follow-up.  Since her last visit, the patient has been doing fine.  Of note, she takes raloxifene  for chemoprevention as tamoxifen  caused significant nausea.  The patient denies having any particular changes in her breasts which concern her for disease recurrence.  Of note her annual mammogram in late September 2025 showed no evidence of disease recurrence.    She is also followed for polycythemia.  Past JAK2 mutation testing came back negative.  Of note, the patient is taking Farxiga , spironolactone , and torsemide -all of which can potentiate a secondary polycythemia.  PHYSICAL EXAM:  There were no vitals taken for this visit. Wt Readings from Last 3 Encounters:  08/13/24 198 lb (89.8 kg)  08/05/24 185 lb (83.9 kg)  08/02/24 197 lb 12.8 oz (89.7 kg)   There is no height or weight on file to calculate BMI. Performance status (ECOG): 0 - Asymptomatic Physical Exam Constitutional:      Appearance: Normal appearance.  HENT:     Mouth/Throat:     Pharynx: Oropharynx is clear. No oropharyngeal exudate.  Cardiovascular:     Rate and Rhythm: Normal rate and regular rhythm.     Heart sounds: No murmur heard.    No friction rub. No gallop.  Pulmonary:     Breath sounds: Normal breath sounds.  Chest:  Breasts:    Right: No swelling, bleeding, inverted nipple, mass, nipple discharge or skin change.     Left: No swelling, bleeding, inverted nipple, mass, nipple discharge or skin change.  Abdominal:     General: Bowel sounds are normal. There is no distension.     Palpations: Abdomen is soft. There is no mass.      Tenderness: There is no abdominal tenderness.  Musculoskeletal:        General: No tenderness.     Cervical back: Normal range of motion and neck supple.     Right lower leg: No edema.     Left lower leg: No edema.  Lymphadenopathy:     Cervical: No cervical adenopathy.     Right cervical: No superficial, deep or posterior cervical adenopathy.    Left cervical: No superficial, deep or posterior cervical adenopathy.     Upper Body:     Right upper body: No supraclavicular or axillary adenopathy.     Left upper body: No supraclavicular or axillary adenopathy.     Lower Body: No right inguinal adenopathy. No left inguinal adenopathy.  Skin:    Coloration: Skin is not jaundiced.     Findings: No lesion or rash.  Neurological:     General: No focal deficit present.     Mental Status: She is alert and oriented to person, place, and time. Mental status is at baseline.  Psychiatric:        Mood and Affect: Mood normal.        Behavior: Behavior normal.        Thought Content: Thought content normal.        Judgment: Judgment normal.    LABS:    Latest Reference Range & Units 01/28/24 11:02  WBC 3.4 - 10.8 x10E3/uL 7.8  RBC 3.77 - 5.28 x10E6/uL 5.34 (H)  Hemoglobin 11.1 - 15.9 g/dL 82.6 (H)  HCT 65.9 - 53.3 % 49.8 (H)  MCV 79 - 97 fL 93  MCH 26.6 - 33.0 pg 32.4  MCHC 31.5 - 35.7 g/dL 65.2  RDW 88.2 - 84.5 % 12.4  Platelets 150 - 450 x10E3/uL 280  (H): Data is abnormally high  ASSESSMENT & PLAN:  A 63 y.o. 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   Based upon her clinical breast exam today, the patient remains disease-free.  She knows to continue taking raloxifene  for her 5 years of chemoprevention.  With respect to her secondary polycythemia, her hemoglobin has been higher lately.  I do believe this is related to her medications, which include Farxiga , torsemide , and spironolactone .  Ultimately, if her hemoglobin  rises above 18, she may need to talk to her cardiologist and primary care provider about which medicines can either be decreased or stopped to prevent worsening polycythemia over time.  Otherwise, as the patient is doing well, I will see her back in 6 months for repeat clinical assessment.  Her annual mammogram has already been scheduled before her next visit for her continued radiographic breast cancer surveillance.  The patient understands all the plans discussed today and is in agreement with them.  Alayha Babineaux DELENA Kerns, MD

## 2024-08-25 ENCOUNTER — Inpatient Hospital Stay: Attending: Oncology

## 2024-08-25 ENCOUNTER — Inpatient Hospital Stay

## 2024-08-25 ENCOUNTER — Telehealth: Payer: Self-pay | Admitting: Oncology

## 2024-08-25 ENCOUNTER — Inpatient Hospital Stay: Admitting: Oncology

## 2024-08-25 ENCOUNTER — Other Ambulatory Visit: Payer: Self-pay | Admitting: Oncology

## 2024-08-25 VITALS — BP 130/52 | HR 76 | Temp 98.0°F | Resp 16 | Ht 65.0 in | Wt 201.3 lb

## 2024-08-25 DIAGNOSIS — D0511 Intraductal carcinoma in situ of right breast: Secondary | ICD-10-CM | POA: Diagnosis not present

## 2024-08-25 DIAGNOSIS — Z79810 Long term (current) use of selective estrogen receptor modulators (SERMs): Secondary | ICD-10-CM | POA: Insufficient documentation

## 2024-08-25 DIAGNOSIS — D751 Secondary polycythemia: Secondary | ICD-10-CM | POA: Diagnosis not present

## 2024-08-25 DIAGNOSIS — D0510 Intraductal carcinoma in situ of unspecified breast: Secondary | ICD-10-CM | POA: Insufficient documentation

## 2024-08-25 DIAGNOSIS — Z79899 Other long term (current) drug therapy: Secondary | ICD-10-CM | POA: Insufficient documentation

## 2024-08-25 LAB — CBC WITH DIFFERENTIAL (CANCER CENTER ONLY)
Abs Immature Granulocytes: 0.02 K/uL (ref 0.00–0.07)
Basophils Absolute: 0 K/uL (ref 0.0–0.1)
Basophils Relative: 1 %
Eosinophils Absolute: 0.3 K/uL (ref 0.0–0.5)
Eosinophils Relative: 4 %
HCT: 46.5 % — ABNORMAL HIGH (ref 36.0–46.0)
Hemoglobin: 16 g/dL — ABNORMAL HIGH (ref 12.0–15.0)
Immature Granulocytes: 0 %
Lymphocytes Relative: 36 %
Lymphs Abs: 2.2 K/uL (ref 0.7–4.0)
MCH: 31.2 pg (ref 26.0–34.0)
MCHC: 34.4 g/dL (ref 30.0–36.0)
MCV: 90.6 fL (ref 80.0–100.0)
Monocytes Absolute: 0.7 K/uL (ref 0.1–1.0)
Monocytes Relative: 11 %
Neutro Abs: 3 K/uL (ref 1.7–7.7)
Neutrophils Relative %: 48 %
Platelet Count: 239 K/uL (ref 150–400)
RBC: 5.13 MIL/uL — ABNORMAL HIGH (ref 3.87–5.11)
RDW: 13.2 % (ref 11.5–15.5)
WBC Count: 6.1 K/uL (ref 4.0–10.5)
nRBC: 0 % (ref 0.0–0.2)

## 2024-08-25 NOTE — Telephone Encounter (Signed)
 Patient has been scheduled for follow-up visit per 08/25/24 LOS.  Pt given an appt calendar with date and time.

## 2024-09-09 ENCOUNTER — Ambulatory Visit (INDEPENDENT_AMBULATORY_CARE_PROVIDER_SITE_OTHER)

## 2024-09-09 ENCOUNTER — Other Ambulatory Visit: Payer: Self-pay | Admitting: Internal Medicine

## 2024-09-09 DIAGNOSIS — I255 Ischemic cardiomyopathy: Secondary | ICD-10-CM | POA: Diagnosis not present

## 2024-09-14 ENCOUNTER — Encounter: Payer: Self-pay | Admitting: Family Medicine

## 2024-09-14 ENCOUNTER — Other Ambulatory Visit: Payer: Self-pay

## 2024-09-14 MED ORDER — HYDROCODONE-ACETAMINOPHEN 7.5-325 MG PO TABS: 1.0000 | ORAL_TABLET | Freq: Four times a day (QID) | ORAL | 0 refills | Status: DC | PRN

## 2024-09-25 ENCOUNTER — Encounter: Payer: Self-pay | Admitting: Cardiology

## 2024-09-27 LAB — CUP PACEART REMOTE DEVICE CHECK
Date Time Interrogation Session: 20250729142131
Date Time Interrogation Session: 20251028142250
Implantable Lead Connection Status: 753985
Implantable Lead Connection Status: 753985
Implantable Lead Connection Status: 753985
Implantable Lead Connection Status: 753985
Implantable Lead Implant Date: 20180410
Implantable Lead Implant Date: 20180410
Implantable Lead Implant Date: 20180410
Implantable Lead Implant Date: 20180410
Implantable Lead Location: 753859
Implantable Lead Location: 753859
Implantable Lead Location: 753860
Implantable Lead Location: 753860
Implantable Lead Model: 377
Implantable Lead Model: 377
Implantable Lead Model: 402266
Implantable Lead Model: 402266
Implantable Lead Serial Number: 49794726
Implantable Lead Serial Number: 49794726
Implantable Lead Serial Number: 49838890
Implantable Lead Serial Number: 49838890
Implantable Pulse Generator Implant Date: 20180410
Implantable Pulse Generator Implant Date: 20180410
Pulse Gen Model: 404622
Pulse Gen Model: 404622
Pulse Gen Serial Number: 60982098
Pulse Gen Serial Number: 60982098

## 2024-09-28 ENCOUNTER — Ambulatory Visit: Payer: Self-pay | Admitting: Cardiology

## 2024-09-28 NOTE — Progress Notes (Signed)
 Remote ICD Transmission

## 2024-09-30 ENCOUNTER — Ambulatory Visit (HOSPITAL_COMMUNITY)
Admission: RE | Admit: 2024-09-30 | Discharge: 2024-09-30 | Disposition: A | Discharge status: Home / Self Care | Source: Ambulatory Visit | Attending: Internal Medicine | Admitting: Internal Medicine

## 2024-09-30 ENCOUNTER — Encounter (HOSPITAL_COMMUNITY): Payer: Self-pay | Admitting: Internal Medicine

## 2024-09-30 VITALS — BP 122/74 | HR 80 | Ht 65.0 in | Wt 205.8 lb

## 2024-09-30 DIAGNOSIS — Z6834 Body mass index (BMI) 34.0-34.9, adult: Secondary | ICD-10-CM | POA: Insufficient documentation

## 2024-09-30 DIAGNOSIS — I2510 Atherosclerotic heart disease of native coronary artery without angina pectoris: Secondary | ICD-10-CM | POA: Diagnosis not present

## 2024-09-30 DIAGNOSIS — Z923 Personal history of irradiation: Secondary | ICD-10-CM | POA: Diagnosis not present

## 2024-09-30 DIAGNOSIS — I252 Old myocardial infarction: Secondary | ICD-10-CM | POA: Diagnosis not present

## 2024-09-30 DIAGNOSIS — I255 Ischemic cardiomyopathy: Secondary | ICD-10-CM | POA: Insufficient documentation

## 2024-09-30 DIAGNOSIS — I5022 Chronic systolic (congestive) heart failure: Secondary | ICD-10-CM | POA: Insufficient documentation

## 2024-09-30 DIAGNOSIS — D751 Secondary polycythemia: Secondary | ICD-10-CM | POA: Insufficient documentation

## 2024-09-30 DIAGNOSIS — I361 Nonrheumatic tricuspid (valve) insufficiency: Secondary | ICD-10-CM | POA: Insufficient documentation

## 2024-09-30 DIAGNOSIS — Z7982 Long term (current) use of aspirin: Secondary | ICD-10-CM | POA: Diagnosis not present

## 2024-09-30 DIAGNOSIS — Z853 Personal history of malignant neoplasm of breast: Secondary | ICD-10-CM | POA: Insufficient documentation

## 2024-09-30 DIAGNOSIS — Z7984 Long term (current) use of oral hypoglycemic drugs: Secondary | ICD-10-CM | POA: Diagnosis not present

## 2024-09-30 DIAGNOSIS — I110 Hypertensive heart disease with heart failure: Secondary | ICD-10-CM | POA: Diagnosis not present

## 2024-09-30 DIAGNOSIS — Z95810 Presence of automatic (implantable) cardiac defibrillator: Secondary | ICD-10-CM | POA: Insufficient documentation

## 2024-09-30 DIAGNOSIS — Z79899 Other long term (current) drug therapy: Secondary | ICD-10-CM | POA: Diagnosis not present

## 2024-09-30 DIAGNOSIS — R002 Palpitations: Secondary | ICD-10-CM | POA: Diagnosis not present

## 2024-09-30 DIAGNOSIS — Z95820 Peripheral vascular angioplasty status with implants and grafts: Secondary | ICD-10-CM | POA: Insufficient documentation

## 2024-09-30 DIAGNOSIS — Z955 Presence of coronary angioplasty implant and graft: Secondary | ICD-10-CM | POA: Insufficient documentation

## 2024-09-30 LAB — BASIC METABOLIC PANEL WITH GFR
Anion gap: 10 (ref 5–15)
BUN: 15 mg/dL (ref 8–23)
CO2: 26 mmol/L (ref 22–32)
Calcium: 9.4 mg/dL (ref 8.9–10.3)
Chloride: 105 mmol/L (ref 98–111)
Creatinine, Ser: 0.91 mg/dL (ref 0.44–1.00)
GFR, Estimated: >60 mL/min (ref >60)
Glucose, Bld: 92 mg/dL (ref 70–99)
Potassium: 4.3 mmol/L (ref 3.5–5.1)
Sodium: 141 mmol/L (ref 135–145)

## 2024-09-30 LAB — DIGOXIN LEVEL: Digoxin Level: <0.6 ng/mL — ABNORMAL LOW (ref 0.8–2.0)

## 2024-09-30 LAB — BRAIN NATRIURETIC PEPTIDE: B Natriuretic Peptide: 129.1 pg/mL — ABNORMAL HIGH (ref 0.0–100.0)

## 2024-09-30 NOTE — Addendum Note (Signed)
 Encounter addended by: Tita Andriette NOVAK, RN on: 09/30/2024 12:20 PM  Actions taken: Clinical Note Signed, Visit diagnoses modified, Order list changed, Diagnosis association updated, Charge Capture section accepted

## 2024-09-30 NOTE — Patient Instructions (Signed)
 Medication Changes:  No Changes In Medications at this time.   Lab Work:  Labs done today, your results will be available in MyChart, we will contact you for abnormal readings.  Follow-Up in: 4 months with Dr. Cherrie PLEASE CALL OUR OFFICE AROUND January 2026 TO GET SCHEDULED FOR YOUR APPOINTMENT. PHONE NUMBER IS [phone] OPTION 2  At the [facility], you and your health needs are our priority. We have a designated team specialized in the treatment of Heart Failure. This Care Team includes your primary Heart Failure Specialized Cardiologist (physician), Advanced Practice Providers (APPs- Physician Assistants and Nurse Practitioners), and Pharmacist who all work together to provide you with the care you need, when you need it.   You may see any of the following providers on your designated Care Team at your next follow up:  Dr. Toribio Cherrie Dr. Ezra Shuck Dr. Odis Brownie Greig Mosses, NP Caffie Shed, [place] [facility] [place], [place] Beckey Coe, NP Jordan Lee, NP Tinnie Redman, PharmD   Please be sure to bring in all your medications bottles to every appointment.   Need to Contact Us :  If you have any questions or concerns before your next appointment please send us  a message through Wade or call our office at [phone].    TO LEAVE A MESSAGE FOR THE NURSE SELECT OPTION 2, PLEASE LEAVE A MESSAGE INCLUDING: YOUR NAME DATE OF BIRTH CALL BACK NUMBER REASON FOR CALL**this is important as we prioritize the call backs  YOU WILL RECEIVE A CALL BACK THE SAME DAY AS LONG AS YOU CALL BEFORE 4:00 PM

## 2024-09-30 NOTE — Progress Notes (Signed)
 [facility] Note  Date:  09/30/2024   ID:  Allison White, DOB 12-31-1960, MRN [id number]  Location: Home  Provider location: [facility] Type of Visit: Established patient  PCP:  Sherre Clapper, MD  HF Cardiologist: Dr. Cherrie   HPI: Allison White is a 63 y.o. female with morbid obesity, HTN, carotid artery dissection with stenting of left carotid due to possible FMD, CAD s/p anterior MI 1/18 (DES to LAD), systolic HF with EF 20-25% s/p Biotronik ICD.   Seen in 5/21 with worsening NYHA IIIb symptoms. Had R/L cath with normal coronary arteries; widely patent LAD stent, EF 20-25%, Normal hemodynamics with CI 3.4. -> CPX  = pVO2: 15.3 (90% predicted pVO2) - adjusted to iBW 25.40ml/kg/min VE/VCO2 slope:  33   Seen in HF clinic 01/2021 with orthostasis. Reds 30%. Recommended ted hose. Sleep study deferred at patient/husband request.   Had lumpectomy 9/22 for abnormal mammogram. Finished 6 weeks of XRT in 12/22. No chemo.   Echo 11/22 EF 25-30% G1DD  RV ok   Echo 01/09/23: EF 20-25% RV ok G2 DD mod MR  Underwent Barostim on 11/14/23.Last titration attempt 04/19/24. Recurrent stim at 5.4-5.6   Admitted 6/25 with acute cholecystitis. Underwent lap chole, stable from HF standpoint.   Echo 08/02/24 EF 30-35% LV dilated RV ok   08/05/24 S/P ERCP Bile Duct   Today she returns for HF follow up.Overall feeling fine. Much improved. GI issues resolved.  Remains SOB with yard work and stairs. Denies PND/Orthopnea. Appetite improved. No fever or chills. Weight at home trending up. Taking all medications.  Cardiac Studies - CPX 8/21 FVC 3.08 (91%)      FEV1 2.30 (87%%)        FEV1/FVC 75 (94%)        MVV 89 (94%)  RER 1.10 Peak VO2: 15.3 (90% predicted peak VO2) - adjusted to iBW 25.40ml/kg/min VE/VCO2 slope:  33   - R/LHC 04/07/20 Ao = 102/56 (75) LV = 104/13 RA = 3 RV = 27/4 PA = 28/9 (17) PCW = 7 Fick cardiac output/index = 7.2/3.4 PVR  = 1.4 WU FA sat = 99% PA sat = 79%, 81% High SVC sat = 81%  - Monitor 10/19 1. Sinus rhythm 2. Rare PVCs and bigeminy (< 1.0%) 3. Two patient-triggered events associated with isolated PVCs.  4. No high-grade arrhythmias    - CPX 03/17/18 FVC 3.00 (87%), FEV1 2.97(88%), FEV1/FVC 79 (99%)  Peak VO2: 13.8 - When adjusted to the patient's ideal body weight of 142.2 lb (64.5 kg) the peak VO2 is 22.6 ml/kg VE/VCO2 slope: 38 OUES: 1.52 Peak RER: 1.11 Ventilatory Threshold: 11.8 VE/MVV:  95% O2pulse:  8 Interpretation: Mild limitation due to HF and obesity   Past Medical History:  Diagnosis Date   AICD (automatic cardioverter/defibrillator) present 02/25/2017   Biotronik- Dual PPM/AICD   Arthritis    Cancer (HCC) 2022   Rt Breast   CHF (congestive heart failure) (HCC)    Coronary artery disease    Diabetes mellitus without complication (HCC)    Pt denies having diabetes   Fibromuscular dysplasia    FMD (facioscapulohumeral muscular dystrophy) (HCC)    Headache(784.0)    Heart failure (HCC)    Hyperlipidemia    MI (myocardial infarction) (HCC) 11/19/2016   s/p DES LAD   Migraine    Nonruptured cerebral aneurysm, internal carotid artery 07/14/2008   Left ICA stent placed for pseudoaneurysm, likely from dissection related  to fibromuscular dysplasia009)   Palpitations    PONV (postoperative nausea and vomiting)    Pseudoaneurysm    both carotids    Stroke (HCC) 06/10/2008   when carotid artery dissected, she was told she had a stroke (pt states she has had dissection on both sides); s/p left ICA stent 07/14/08   Vertigo    Past Surgical History:  Procedure Laterality Date   ABDOMINAL HYSTERECTOMY  08/16/2004   Uterus and Cervix (per pathology)   BAROREFLEX SYSTEM INSERTION  11/14/2023   BREAST LUMPECTOMY Right 07/2021   CARDIAC DEFIBRILLATOR PLACEMENT     CEREBRAL ANEURYSM REPAIR Left 2009   CESAREAN SECTION     CHOLECYSTECTOMY N/A 05/15/2024   Procedure: LAPAROSCOPIC  CHOLECYSTECTOMY WITH INTRAOPERATIVE CHOLANGIOGRAM;  Surgeon: Lyndel Deward PARAS, MD;  Location: [facility] OR;  Service: General;  Laterality: N/A;   CORONARY ANGIOPLASTY WITH STENT PLACEMENT     ERCP N/A 08/05/2024   Procedure: ERCP, WITH INTERVENTION IF INDICATED;  Surgeon: Wilhelmenia Aloha Raddle., MD;  Location: [facility];  Service: Gastroenterology;  Laterality: N/A;   ESOPHAGOGASTRODUODENOSCOPY N/A 08/05/2024   Procedure: EGD (ESOPHAGOGASTRODUODENOSCOPY);  Surgeon: Wilhelmenia Aloha Raddle., MD;  Location: [facility];  Service: Gastroenterology;  Laterality: N/A;   EUS N/A 08/05/2024   Procedure: ULTRASOUND, UPPER GI TRACT, ENDOSCOPIC;  Surgeon: Wilhelmenia Aloha Raddle., MD;  Location: [facility];  Service: Gastroenterology;  Laterality: N/A;   RIGHT/LEFT HEART CATH AND CORONARY ANGIOGRAPHY N/A 04/07/2020   Procedure: RIGHT/LEFT HEART CATH AND CORONARY ANGIOGRAPHY;  Surgeon: Cherrie Toribio SAUNDERS, MD;  Location: [facility] INVAS[facility] CV [facility];  Service: Cardiovascular;  Laterality: N/A;   TUBAL LIGATION     Current Outpatient Medications  Medication Sig Dispense Refill   aspirin  81 MG EC tablet Take 81 mg by mouth in the morning.     dapagliflozin  propanediol (FARXIGA ) 10 MG TABS tablet Take 1 tablet (10 mg total) by mouth daily. 90 tablet 3   digoxin  (LANOXIN ) 0.125 MG tablet Take 1 tablet by mouth once daily 90 tablet 0   fenofibrate  160 MG tablet Take 1 tablet (160 mg total) by mouth daily. 90 tablet 1   HYDROcodone -acetaminophen  (NORCO) 7.5-325 MG tablet Take 1 tablet by mouth every 6 (six) hours as needed for moderate pain (pain score 4-6). 60 tablet 0   icosapent  Ethyl (VASCEPA ) 1 g capsule Take 2 capsules by mouth twice daily 120 capsule 0   ivabradine  (CORLANOR ) 7.5 MG TABS tablet Take 1 tablet (7.5 mg total) by mouth 2 (two) times daily with a meal. 180 tablet 3   loratadine  (CLARITIN ) 10 MG tablet Take 10 mg by mouth in the morning. [facility] brand     metoprolol  succinate (TOPROL -XL) 50 MG 24 hr  tablet Take 1 tablet by mouth twice daily 180 tablet 3   nitroGLYCERIN  (NITROSTAT ) 0.4 MG SL tablet Place 0.4 mg under the tongue every 5 (five) minutes as needed for chest pain.     pantoprazole  (PROTONIX ) 40 MG tablet Take 40 mg by mouth daily.     raloxifene  (EVISTA ) 60 MG tablet Take 1 tablet by mouth once daily 90 tablet 3   REPATHA  SURECLICK 140 MG/ML SOAJ INJECT CONTENTS OF 1 PEN SUBCUTANEOUSLY EVERY 14 DAYS 2 mL 0   spironolactone  (ALDACTONE ) 25 MG tablet Take 1 tablet (25 mg total) by mouth daily. 90 tablet 0   torsemide  (DEMADEX ) 20 MG tablet Take 20 mg by mouth as needed.     No current facility-administered medications for this encounter.   Allergies:  Tape, Plavix [clopidogrel bisulfate], Praluent  [alirocumab ], Statins, and Tamoxifen    Social History:  The patient  reports that she quit smoking about 42 years ago. Her smoking use included cigarettes. She has a 6 pack-year smoking history. She has never used smokeless tobacco. She reports that she does not currently use alcohol. She reports that she does not use drugs.   Family History:  The patient's family history includes Brain cancer (age of onset: 36) in her maternal aunt; Breast cancer (age of onset: 6) in her maternal aunt; Cerebral aneurysm in her paternal grandmother; Dementia in her paternal grandfather; Gastric cancer (age of onset: 38) in her mother; Hypertension in her brother; Prostate cancer in her father, maternal grandfather, paternal grandfather, and paternal uncle; Thyroid  disease in her mother.   ROS:  Please see the history of present illness.   All other systems are personally reviewed and negative.   Wt Readings from Last 3 Encounters:  09/30/24 93.4 kg (205 lb 12.8 oz)  08/25/24 91.3 kg (201 lb 4.8 oz)  08/13/24 89.8 kg (198 lb)   BP 122/74   Pulse 80   Ht 5' 5 (1.651 m)   Wt 93.4 kg (205 lb 12.8 oz)   SpO2 96%   BMI 34.25 kg/m   Physical Exam:   General:   No resp difficulty Neck: no JVD.   Cor: Regular rate & rhythm.  Lungs: clear Abdomen: soft, nontender, nondistended.  Extremities: no  edema Neuro: alert & oriented x3   Assessment & Plan: 1. Chronic systolic HF, ICM - Echo 02/2018 LVEF 25-30%, Trivial MR, Normal RV, Mild TR, PA peak pressure 26 mm Hg - s/p Biotronik ICD (Followed by Dr. Shelton) - Echo 07/13/19 EF 20-25% - Cath 5/21 EF 20-25% normal coronaries with patent LAD stent. Well compensated hemodynamics with CI 3.4 - Echo 12/21 EF 25% - CPX 8/21 very reassuring - Peak VO2: 15.3 (90% predicted peak VO2) - adjusted to iBW 25.76ml/kg/min. VEVCO2 33  - Echo 11/22 EF 25-30% G1DD  RV ok  - Echo 01/09/23: EF 20-25% RV ok G2 DD mod MR   - Underwent Barostim implant 11/14/23   - Echo 08/02/24 EF 30-35% LV dilated RV ok  - Improved NYHA II - Appears euvolemic. Taking tosemide as needed.   - Cotninue losartan  25 at bedtime (off Entresto  with low BP) - Continue spironolactone  25 mg daily.  - Continue Toprol  XL 50 mg bid - Continue digoxin  0.125 mg. Check dig level.  - Continue Farxiga  10 mg daily.  - Continue ivabradine  7.5 mg bid - Last titration in June 2025. @ 4.4 developed stim 5.4-5.6    2. Abdominal pain/diarrhea - pre-operative cardiac evaluation - not resolved after cholecystectomy - Now resolved after ERCP.   3. CAD - s/p anterior MI 1/18 with DES to LAD in [place] - Cath 5/21 normal cors, with patent LAD stent - No clear s/s angina (see section above) - Failed statin.  - Continue Repatha , fenofibrate  & Vascepa  - Most recent LDL 57 (6/25), Goal LDL < 70.   4. Obesity - Body mass index is 34.25 kg/m. -   5. H/o carotid pseudoaneursym - Left ICA pseudoaneurysm at the cervical petrous portion s/p stent placement (05/2008, 10/2009), chronic HAs and vertigo - has residual vertigo and dizziness.  - No changes   6. Palpitations - Intermittent. - Zio patch 10/19 was ok  - Having some palpitations with Barostim  7. Breast Cancer - s/p lumpectomy 9/22  followed by XRT. Completed 12/22 -  No plans for chemo  8. Polycythemia - followed by hematology. BMBx was normal - Home sleep study 1/22 AHI 4.6  - Last hgb 15.2, at PCP on 05/24/24  Follow up in 3 months.    Greig Mosses, NP  09/30/2024 11:43 AM  [facility] [address] [facility] [place] [address] [phone] (office) [phone] (fax)  Patient seen and examined with the above-signed Advanced Practice Provider and/or Housestaff. I personally reviewed laboratory data, imaging studies and relevant notes. I independently examined the patient and formulated the important aspects of the plan. I have edited the note to reflect any of my changes or salient points. I have personally discussed the plan with the patient and/or family.  Feels much better after ERCP and Barostim as well. More active. NYHA II.Volume ok. Still with some palpitations  General:  Sitting up No resp difficulty HEENT: normal Neck: supple. no JVD.  Cor: Regular rate & rhythm. No rubs, gallops or murmurs. Lungs: clear Abdomen: soft, nontender, nondistended.Good bowel sounds. Extremities: no cyanosis, clubbing, rash, edema Neuro: alert & orientedx3, cranial nerves grossly intact. moves all 4 extremities w/o difficulty. Affect pleasant  Overall improved. Now NYHA II. On good meds. Will continue current GDMT for now. Adjust Barostim. Labs today including digoxin  level.   Toribio Fuel, MD  12:16 PM

## 2024-10-04 ENCOUNTER — Other Ambulatory Visit (HOSPITAL_COMMUNITY): Payer: Self-pay

## 2024-10-04 ENCOUNTER — Other Ambulatory Visit: Payer: Self-pay | Admitting: Family Medicine

## 2024-10-04 DIAGNOSIS — I25119 Atherosclerotic heart disease of native coronary artery with unspecified angina pectoris: Secondary | ICD-10-CM

## 2024-10-25 ENCOUNTER — Other Ambulatory Visit: Payer: Self-pay

## 2024-11-08 ENCOUNTER — Encounter: Payer: Self-pay | Admitting: Family Medicine

## 2024-11-08 ENCOUNTER — Other Ambulatory Visit: Payer: Self-pay | Admitting: Family Medicine

## 2024-11-08 MED ORDER — HYDROCODONE-ACETAMINOPHEN 7.5-325 MG PO TABS: 1.0000 | ORAL_TABLET | Freq: Four times a day (QID) | ORAL | 0 refills | Status: AC | PRN

## 2024-11-18 ENCOUNTER — Other Ambulatory Visit: Payer: Self-pay | Admitting: Family Medicine

## 2024-11-18 DIAGNOSIS — E782 Mixed hyperlipidemia: Secondary | ICD-10-CM

## 2024-11-22 ENCOUNTER — Other Ambulatory Visit (HOSPITAL_COMMUNITY): Payer: Self-pay | Admitting: Cardiology

## 2024-11-22 MED ORDER — DAPAGLIFLOZIN PROPANEDIOL 10 MG PO TABS: 10.0000 mg | ORAL_TABLET | Freq: Every day | ORAL | 3 refills | Status: AC

## 2024-12-03 ENCOUNTER — Encounter: Payer: Self-pay | Admitting: Cardiology

## 2024-12-08 ENCOUNTER — Other Ambulatory Visit: Payer: Self-pay | Admitting: Medical Genetics

## 2024-12-09 ENCOUNTER — Ambulatory Visit

## 2024-12-09 DIAGNOSIS — I5022 Chronic systolic (congestive) heart failure: Secondary | ICD-10-CM

## 2024-12-12 ENCOUNTER — Encounter: Payer: Self-pay | Admitting: Cardiology

## 2024-12-13 ENCOUNTER — Telehealth (HOSPITAL_COMMUNITY): Payer: Self-pay

## 2024-12-13 ENCOUNTER — Other Ambulatory Visit (HOSPITAL_COMMUNITY): Payer: Self-pay

## 2024-12-13 LAB — CUP PACEART REMOTE DEVICE CHECK
Battery Voltage: 22
Date Time Interrogation Session: 20260122073518
Implantable Lead Connection Status: 753985
Implantable Lead Connection Status: 753985
Implantable Lead Implant Date: 20180410
Implantable Lead Implant Date: 20180410
Implantable Lead Location: 753859
Implantable Lead Location: 753860
Implantable Lead Model: 377
Implantable Lead Model: 402266
Implantable Lead Serial Number: 49794726
Implantable Lead Serial Number: 49838890
Implantable Pulse Generator Implant Date: 20180410
Pulse Gen Model: 404622
Pulse Gen Serial Number: 60982098

## 2024-12-13 NOTE — Telephone Encounter (Signed)
 [facility] Patient Advocate Encounter  The patient was renewed for a [employer] grant that will help cover the cost of Digoxin , Farxiga , Ivabradine , Metoprolol , Spironolactone .  Total amount awarded, $7,500.  Effective: 11/13/2024 - 11/12/2025.  BIN [id number] PCN [id number] [id number] ID [id number]  Pharmacy provided with approval and processing information. Patient informed via MyChart.  Rachel DEL, CPhT Rx Patient Advocate Phone: [phone]

## 2024-12-13 NOTE — Progress Notes (Signed)
 Remote ICD Transmission

## 2024-12-14 ENCOUNTER — Ambulatory Visit: Payer: Self-pay | Admitting: Cardiology

## 2024-12-15 ENCOUNTER — Ambulatory Visit: Admitting: Family Medicine

## 2024-12-16 ENCOUNTER — Other Ambulatory Visit: Payer: Self-pay | Admitting: Internal Medicine

## 2024-12-16 ENCOUNTER — Other Ambulatory Visit: Payer: Self-pay | Admitting: Family Medicine

## 2024-12-17 ENCOUNTER — Other Ambulatory Visit (HOSPITAL_COMMUNITY): Payer: Self-pay

## 2024-12-20 ENCOUNTER — Telehealth (HOSPITAL_COMMUNITY): Payer: Self-pay

## 2025-02-04 ENCOUNTER — Ambulatory Visit (HOSPITAL_COMMUNITY): Admitting: Internal Medicine

## 2025-02-10 ENCOUNTER — Ambulatory Visit: Admitting: Physician Assistant

## 2025-02-23 ENCOUNTER — Other Ambulatory Visit

## 2025-02-23 ENCOUNTER — Ambulatory Visit: Admitting: Oncology

## 2025-03-01 ENCOUNTER — Ambulatory Visit: Admitting: Family Medicine

## 2025-03-10 ENCOUNTER — Encounter

## 2025-03-24 ENCOUNTER — Ambulatory Visit

## 2025-06-09 ENCOUNTER — Encounter

## 2025-09-08 ENCOUNTER — Encounter
